# Patient Record
Sex: Female | Born: 1960 | Race: Black or African American | Hispanic: No | Marital: Single | Smoking: Never smoker
Health system: Southern US, Community
[De-identification: ages and names within clinical notes are randomized; demographics above are authoritative.]

## PROBLEM LIST (undated history)

## (undated) DIAGNOSIS — E079 Disorder of thyroid, unspecified: Secondary | ICD-10-CM

## (undated) DIAGNOSIS — H409 Unspecified glaucoma: Secondary | ICD-10-CM

## (undated) DIAGNOSIS — E279 Disorder of adrenal gland, unspecified: Secondary | ICD-10-CM

## (undated) DIAGNOSIS — K219 Gastro-esophageal reflux disease without esophagitis: Secondary | ICD-10-CM

## (undated) DIAGNOSIS — G8929 Other chronic pain: Secondary | ICD-10-CM

## (undated) DIAGNOSIS — D649 Anemia, unspecified: Secondary | ICD-10-CM

## (undated) DIAGNOSIS — M1990 Unspecified osteoarthritis, unspecified site: Secondary | ICD-10-CM

## (undated) DIAGNOSIS — E785 Hyperlipidemia, unspecified: Secondary | ICD-10-CM

## (undated) DIAGNOSIS — R519 Headache, unspecified: Secondary | ICD-10-CM

## (undated) DIAGNOSIS — I10 Essential (primary) hypertension: Secondary | ICD-10-CM

## (undated) DIAGNOSIS — R51 Headache: Secondary | ICD-10-CM

## (undated) DIAGNOSIS — R55 Syncope and collapse: Secondary | ICD-10-CM

## (undated) DIAGNOSIS — A419 Sepsis, unspecified organism: Secondary | ICD-10-CM

## (undated) DIAGNOSIS — G4730 Sleep apnea, unspecified: Secondary | ICD-10-CM

## (undated) DIAGNOSIS — M549 Dorsalgia, unspecified: Secondary | ICD-10-CM

## (undated) DIAGNOSIS — E039 Hypothyroidism, unspecified: Secondary | ICD-10-CM

## (undated) DIAGNOSIS — M5416 Radiculopathy, lumbar region: Secondary | ICD-10-CM

## (undated) DIAGNOSIS — I4891 Unspecified atrial fibrillation: Secondary | ICD-10-CM

## (undated) DIAGNOSIS — E2740 Unspecified adrenocortical insufficiency: Secondary | ICD-10-CM

## (undated) DIAGNOSIS — J383 Other diseases of vocal cords: Secondary | ICD-10-CM

## (undated) DIAGNOSIS — J449 Chronic obstructive pulmonary disease, unspecified: Secondary | ICD-10-CM

## (undated) HISTORY — PX: BACK SURGERY: SHX140

## (undated) HISTORY — PX: CHOLECYSTECTOMY, LAPAROSCOPIC: SHX56

## (undated) HISTORY — PX: OTHER SURGICAL HISTORY: SHX169

## (undated) HISTORY — PX: CHOLECYSTECTOMY: SHX55

## (undated) HISTORY — PX: ABDOMINAL HYSTERECTOMY: SHX81

## (undated) HISTORY — PX: CARDIAC CATHETERIZATION: SHX172

---

## 1999-01-22 ENCOUNTER — Ambulatory Visit (HOSPITAL_COMMUNITY): Admission: RE | Admit: 1999-01-22 | Discharge: 1999-01-23 | Payer: Self-pay | Admitting: Orthopaedic Surgery

## 1999-06-16 ENCOUNTER — Emergency Department (HOSPITAL_COMMUNITY): Admission: EM | Admit: 1999-06-16 | Discharge: 1999-06-17 | Payer: Self-pay | Admitting: Emergency Medicine

## 1999-11-09 ENCOUNTER — Emergency Department (HOSPITAL_COMMUNITY): Admission: EM | Admit: 1999-11-09 | Discharge: 1999-11-09 | Payer: Self-pay | Admitting: Internal Medicine

## 1999-11-09 ENCOUNTER — Encounter: Payer: Self-pay | Admitting: Internal Medicine

## 2000-02-08 ENCOUNTER — Emergency Department (HOSPITAL_COMMUNITY): Admission: EM | Admit: 2000-02-08 | Discharge: 2000-02-08 | Payer: Self-pay | Admitting: Emergency Medicine

## 2001-01-14 ENCOUNTER — Emergency Department (HOSPITAL_COMMUNITY): Admission: EM | Admit: 2001-01-14 | Discharge: 2001-01-14 | Payer: Self-pay | Admitting: *Deleted

## 2003-02-27 ENCOUNTER — Emergency Department (HOSPITAL_COMMUNITY): Admission: EM | Admit: 2003-02-27 | Discharge: 2003-02-27 | Payer: Self-pay | Admitting: Emergency Medicine

## 2004-01-18 ENCOUNTER — Emergency Department: Payer: Self-pay | Admitting: Emergency Medicine

## 2004-02-11 ENCOUNTER — Ambulatory Visit (HOSPITAL_COMMUNITY): Admission: RE | Admit: 2004-02-11 | Discharge: 2004-02-11 | Payer: Self-pay | Admitting: Internal Medicine

## 2004-02-25 ENCOUNTER — Encounter: Admission: RE | Admit: 2004-02-25 | Discharge: 2004-02-25 | Payer: Self-pay | Admitting: Internal Medicine

## 2004-04-11 ENCOUNTER — Emergency Department: Payer: Self-pay | Admitting: Emergency Medicine

## 2004-04-11 ENCOUNTER — Other Ambulatory Visit: Payer: Self-pay

## 2004-06-27 ENCOUNTER — Ambulatory Visit: Payer: Self-pay | Admitting: Internal Medicine

## 2004-08-14 ENCOUNTER — Encounter: Admission: RE | Admit: 2004-08-14 | Discharge: 2004-08-14 | Payer: Self-pay | Admitting: Internal Medicine

## 2004-10-07 ENCOUNTER — Other Ambulatory Visit: Payer: Self-pay

## 2004-10-07 ENCOUNTER — Inpatient Hospital Stay: Payer: Self-pay | Admitting: Internal Medicine

## 2004-12-28 ENCOUNTER — Emergency Department: Payer: Self-pay | Admitting: Emergency Medicine

## 2005-01-22 ENCOUNTER — Encounter: Admission: RE | Admit: 2005-01-22 | Discharge: 2005-01-22 | Payer: Self-pay | Admitting: Internal Medicine

## 2005-03-03 ENCOUNTER — Inpatient Hospital Stay: Payer: Self-pay | Admitting: Internal Medicine

## 2005-04-23 ENCOUNTER — Ambulatory Visit: Payer: Self-pay | Admitting: Internal Medicine

## 2005-05-13 ENCOUNTER — Ambulatory Visit: Payer: Self-pay | Admitting: Gastroenterology

## 2005-05-28 ENCOUNTER — Ambulatory Visit: Payer: Self-pay

## 2005-08-10 ENCOUNTER — Ambulatory Visit: Payer: Self-pay | Admitting: Cardiovascular Disease

## 2005-09-30 ENCOUNTER — Ambulatory Visit: Payer: Self-pay | Admitting: Internal Medicine

## 2006-01-03 ENCOUNTER — Emergency Department: Payer: Self-pay | Admitting: Emergency Medicine

## 2006-03-07 ENCOUNTER — Emergency Department: Payer: Self-pay | Admitting: Internal Medicine

## 2006-03-07 ENCOUNTER — Other Ambulatory Visit: Payer: Self-pay

## 2006-04-13 HISTORY — PX: OTHER SURGICAL HISTORY: SHX169

## 2006-11-04 ENCOUNTER — Other Ambulatory Visit: Payer: Self-pay

## 2006-11-04 ENCOUNTER — Emergency Department: Payer: Self-pay | Admitting: Unknown Physician Specialty

## 2007-01-04 ENCOUNTER — Ambulatory Visit: Payer: Self-pay

## 2007-08-16 ENCOUNTER — Ambulatory Visit: Payer: Self-pay | Admitting: Cardiovascular Disease

## 2008-01-12 ENCOUNTER — Ambulatory Visit: Payer: Self-pay | Admitting: Internal Medicine

## 2008-04-13 HISTORY — PX: COLONOSCOPY: SHX174

## 2009-07-04 ENCOUNTER — Ambulatory Visit: Payer: Self-pay | Admitting: Cardiovascular Disease

## 2011-04-14 DIAGNOSIS — E2740 Unspecified adrenocortical insufficiency: Secondary | ICD-10-CM

## 2011-04-14 HISTORY — DX: Unspecified adrenocortical insufficiency: E27.40

## 2011-05-11 ENCOUNTER — Encounter (HOSPITAL_COMMUNITY): Payer: Self-pay

## 2011-05-11 ENCOUNTER — Encounter (HOSPITAL_COMMUNITY)
Admission: RE | Admit: 2011-05-11 | Discharge: 2011-05-11 | Disposition: A | Discharge status: Home / Self Care | Payer: Self-pay | Source: Ambulatory Visit | Attending: Internal Medicine | Admitting: Internal Medicine

## 2011-05-11 DIAGNOSIS — J45909 Unspecified asthma, uncomplicated: Secondary | ICD-10-CM | POA: Insufficient documentation

## 2011-05-11 HISTORY — DX: Hyperlipidemia, unspecified: E78.5

## 2011-05-11 HISTORY — DX: Syncope and collapse: R55

## 2011-05-11 HISTORY — DX: Essential (primary) hypertension: I10

## 2011-05-11 NOTE — Progress Notes (Signed)
Orientation to Pulmonary Rehab. Demonstration and practice of PLB using pulse oximeter.  Patient able to return demonstration satisfactorily. Safety and hand hygiene in the exercise area reviewed with patient.  Patient voices understanding.

## 2011-05-19 ENCOUNTER — Encounter (HOSPITAL_COMMUNITY)
Admission: RE | Admit: 2011-05-19 | Discharge: 2011-05-19 | Disposition: A | Discharge status: Home / Self Care | Payer: Medicare Other | Source: Ambulatory Visit | Attending: Internal Medicine | Admitting: Internal Medicine

## 2011-05-19 DIAGNOSIS — J45909 Unspecified asthma, uncomplicated: Secondary | ICD-10-CM | POA: Insufficient documentation

## 2011-05-21 ENCOUNTER — Encounter (HOSPITAL_COMMUNITY)
Admission: RE | Admit: 2011-05-21 | Discharge: 2011-05-21 | Disposition: A | Discharge status: Home / Self Care | Payer: Medicare Other | Source: Ambulatory Visit | Attending: Internal Medicine | Admitting: Internal Medicine

## 2011-05-21 NOTE — Progress Notes (Signed)
Pulmonary Rehab Nutrition Screen  Deborah Murray 51 y.o. female             Ht: 62.5" Ht Readings from Last 1 Encounters:  No data found for Ht    Wt:   256.1lb (116.4 kg) Wt Readings from Last 3 Encounters:  No data found for Wt    BMI: 47.1  53.5%body fat                       Rate Your Plate Score: 51  Please answer the following questions:             YES  NO    Do you live in a nursing home?  X   Do you eat out more than 3 times per week?   X  If yes, how many times per week do you eat out? "Probably" 3-4   Do you have food allergies?   X If yes, what are you allergic to?  Have you gained more than 10 lbs without trying?              X  If yes, how much weight have you  gained? 30 lbs over  weeks/mo   Do you want to lose weight?    X  If yes, what is a goal weight or amount of weight you would like to lose? At least 55 lbs or more  Do you eat alone most of the time?  X    Do you eat less than 2 meals/day?  X If yes, how many meals do you eat? 3 or more  Do you use canned and convenience food? X    Do you use a salt shaker? X    Do you drink more than 3 alcoholic drinks/day?  X If yes, how many drinks per day?  Are you having trouble with constipation? *  X If yes, what are you doing to help relieve constipation?  Do you have financial difficulties with buying food? *  X   Do you usually need help with grocery shopping or with cooking? *  X   Do you have a poor appetite? *                                       X   Do you have trouble chewing/ swallowing? *   X   Do you take vitamin and mineral or herbal supplements? * X  If yes, what kind of supplements do you currently take? MVI    Past Medical History  Diagnosis Date  . Asthma   . Hyperlipidemia   . Hypertension   . Syncope and collapse 05/11/11  adrenal insuffiency  treated at [facility]   Labs Lipid Panel  No results found for this basename: chol, trig, hdl, cholhdl, vldl, ldlcalc   No results found for this  basename: HGBA1C    Nutrition Risk Level:  Low

## 2011-05-21 NOTE — Progress Notes (Signed)
Completed home exercise with patient. Reviewed exercise progression, routine, exercising at a comfortable pace, RPE/Dyspnea scales, how important it is to own a pulse oximeter and how to use one, weather conditions, warning signs and symptoms with exercise, and CP/NTG. We discussed when to call MD. Patient voices understanding. Patient has a goal to build endurance to run around with grand baby without becoming short of breath.   Will continue to encourage and support.

## 2011-05-26 ENCOUNTER — Encounter (HOSPITAL_COMMUNITY)
Admission: RE | Admit: 2011-05-26 | Discharge: 2011-05-26 | Disposition: A | Discharge status: Home / Self Care | Payer: Medicare Other | Source: Ambulatory Visit | Attending: Internal Medicine | Admitting: Internal Medicine

## 2011-05-28 ENCOUNTER — Encounter (HOSPITAL_COMMUNITY)
Admission: RE | Admit: 2011-05-28 | Discharge: 2011-05-28 | Disposition: A | Discharge status: Home / Self Care | Payer: Medicare Other | Source: Ambulatory Visit | Attending: Internal Medicine | Admitting: Internal Medicine

## 2011-06-02 ENCOUNTER — Encounter (HOSPITAL_COMMUNITY)
Admission: RE | Admit: 2011-06-02 | Discharge: 2011-06-02 | Disposition: A | Discharge status: Home / Self Care | Payer: Medicare Other | Source: Ambulatory Visit | Attending: Internal Medicine | Admitting: Internal Medicine

## 2011-06-02 NOTE — Progress Notes (Signed)
Deborah Murray 51 y.o. female Nutrition Note Spoke with pt.  Nutrition Plan and Nutrition Survey reviewed with pt. There are some ways the pt can improve her eating habits. Pt c/o 30 lb wt gain over the past year due to taking prednisone.  Pt states she weighed 370 lb prior to gastric bypass surgery.  Lowest wt was 220# before she started prednisone.  Lifestyle changes to promote weight loss and weight loss tips discussed.  Pt c/o not sleeping while on prednisone.  Pt states she gets 0-2 hours a night.  Per pt, MD did not want pt taking any sleeping aide due to breathing difficulty. Cathie Olden, RN made aware of pt difficulty sleeping. The role of sleep and wt maintenance discussed. Pt taking Caltrate and eating dairy products to achieve goal of 1500 mg of Calcium daily while on prednisone. Pt uses canned and convenience food and eats out 3-4 times a week. Pt add salt to food. The role of sodium in lung disease reviewed with pt. Nutrition Diagnosis   Excessive sodium intake related to over consumption of processed food as evidenced by frequent consumption of convenience food/ canned vegetables and eating out frequently.   Food-and nutrition-related knowledge deficit related to lack of exposure to information as related to diagnosis of pulmonary disease   Obesity related to excessive energy intake as evidenced by a BMI of 47.1 Nutrition Rx/Est. Daily Nutrition Needs for: ? wt loss 1450-1950 Kcal 95-115 gm protein   1500 mg or less sodium     Nutrition Intervention   Pt's individual nutrition plan and goals reviewed with pt.   Benefits of adopting healthy eating habits discussed when pt's Rate Your Plate reviewed.   Handouts for [employer], List of American Heart Association approved foods.   Will fax Dr. Cherly Hensen re: ? If pt may benefit from prescription or OTC sleep aide   Pt to attend the Nutrition and Lung Disease class   Continual client-centered nutrition education by RD, as part of  interdisciplinary care. Goal(s) 1. Pt to identify and limit food sources of sodium. 2. Identify food quantities necessary to achieve wt loss of  -2# per week to a goal wt of 105.5-113.6 kg (232-250 lb) at graduation from pulmonary rehab. 3. Describe the benefit of including fruits, vegetables, whole grains, and low-fat dairy products in a healthy meal plan. Monitor and Evaluate progress toward nutrition goal with team.

## 2011-06-04 ENCOUNTER — Encounter (HOSPITAL_COMMUNITY)
Admission: RE | Admit: 2011-06-04 | Discharge: 2011-06-04 | Disposition: A | Discharge status: Home / Self Care | Payer: Medicare Other | Source: Ambulatory Visit | Attending: Internal Medicine | Admitting: Internal Medicine

## 2011-06-09 ENCOUNTER — Encounter (HOSPITAL_COMMUNITY)
Admission: RE | Admit: 2011-06-09 | Discharge: 2011-06-09 | Disposition: A | Discharge status: Home / Self Care | Payer: Medicare Other | Source: Ambulatory Visit | Attending: Internal Medicine | Admitting: Internal Medicine

## 2011-06-11 ENCOUNTER — Encounter (HOSPITAL_COMMUNITY)
Admission: RE | Admit: 2011-06-11 | Discharge: 2011-06-11 | Disposition: A | Discharge status: Home / Self Care | Payer: Medicare Other | Source: Ambulatory Visit | Attending: Internal Medicine | Admitting: Internal Medicine

## 2011-06-11 NOTE — Progress Notes (Signed)
Deborah Murray reported to staff today during exercise at Pulmonary Rehab that she did not feel good yesterday or today. Very tired, feeling more short of breath.  Distant breath sounds bilaterally.  She is advised to call her Pulmonary MD at [facility].

## 2011-06-16 ENCOUNTER — Encounter (HOSPITAL_COMMUNITY)
Admission: RE | Admit: 2011-06-16 | Discharge: 2011-06-16 | Disposition: A | Discharge status: Home / Self Care | Payer: Medicare Other | Source: Ambulatory Visit | Attending: Internal Medicine | Admitting: Internal Medicine

## 2011-06-16 DIAGNOSIS — J45909 Unspecified asthma, uncomplicated: Secondary | ICD-10-CM | POA: Insufficient documentation

## 2011-06-18 ENCOUNTER — Encounter (HOSPITAL_COMMUNITY)
Admission: RE | Admit: 2011-06-18 | Discharge: 2011-06-18 | Disposition: A | Discharge status: Home / Self Care | Payer: Medicare Other | Source: Ambulatory Visit | Attending: Internal Medicine | Admitting: Internal Medicine

## 2011-06-23 ENCOUNTER — Encounter (HOSPITAL_COMMUNITY)
Admission: RE | Admit: 2011-06-23 | Discharge: 2011-06-23 | Disposition: A | Discharge status: Home / Self Care | Payer: Medicare Other | Source: Ambulatory Visit | Attending: Internal Medicine | Admitting: Internal Medicine

## 2011-06-25 ENCOUNTER — Encounter (HOSPITAL_COMMUNITY)
Admission: RE | Admit: 2011-06-25 | Discharge: 2011-06-25 | Disposition: A | Discharge status: Home / Self Care | Payer: Medicare Other | Source: Ambulatory Visit | Attending: Internal Medicine | Admitting: Internal Medicine

## 2011-06-30 ENCOUNTER — Encounter (HOSPITAL_COMMUNITY)
Admission: RE | Admit: 2011-06-30 | Discharge: 2011-06-30 | Disposition: A | Discharge status: Home / Self Care | Payer: Medicare Other | Source: Ambulatory Visit | Attending: Internal Medicine | Admitting: Internal Medicine

## 2011-07-02 ENCOUNTER — Encounter (HOSPITAL_COMMUNITY)
Admission: RE | Admit: 2011-07-02 | Discharge: 2011-07-02 | Disposition: A | Discharge status: Home / Self Care | Payer: Medicare Other | Source: Ambulatory Visit | Attending: Internal Medicine | Admitting: Internal Medicine

## 2011-07-07 ENCOUNTER — Encounter (HOSPITAL_COMMUNITY)
Admission: RE | Admit: 2011-07-07 | Discharge: 2011-07-07 | Disposition: A | Discharge status: Home / Self Care | Payer: Medicare Other | Source: Ambulatory Visit | Attending: Internal Medicine | Admitting: Internal Medicine

## 2011-07-09 ENCOUNTER — Encounter (HOSPITAL_COMMUNITY)
Admission: RE | Admit: 2011-07-09 | Discharge: 2011-07-09 | Disposition: A | Discharge status: Home / Self Care | Payer: Medicare Other | Source: Ambulatory Visit | Attending: Internal Medicine | Admitting: Internal Medicine

## 2011-07-14 ENCOUNTER — Encounter (HOSPITAL_COMMUNITY)
Admission: RE | Admit: 2011-07-14 | Discharge: 2011-07-14 | Disposition: A | Discharge status: Home / Self Care | Payer: Medicare Other | Source: Ambulatory Visit | Attending: Internal Medicine | Admitting: Internal Medicine

## 2011-07-14 DIAGNOSIS — J45909 Unspecified asthma, uncomplicated: Secondary | ICD-10-CM | POA: Insufficient documentation

## 2011-07-16 ENCOUNTER — Encounter (HOSPITAL_COMMUNITY)
Admission: RE | Admit: 2011-07-16 | Discharge: 2011-07-16 | Disposition: A | Discharge status: Home / Self Care | Payer: Medicare Other | Source: Ambulatory Visit | Attending: Internal Medicine | Admitting: Internal Medicine

## 2011-07-21 ENCOUNTER — Encounter (HOSPITAL_COMMUNITY)
Admission: RE | Admit: 2011-07-21 | Discharge: 2011-07-21 | Disposition: A | Discharge status: Home / Self Care | Payer: Medicare Other | Source: Ambulatory Visit | Attending: Internal Medicine | Admitting: Internal Medicine

## 2011-07-23 ENCOUNTER — Encounter (HOSPITAL_COMMUNITY)
Admission: RE | Admit: 2011-07-23 | Discharge: 2011-07-23 | Disposition: A | Discharge status: Home / Self Care | Payer: Medicare Other | Source: Ambulatory Visit | Attending: Internal Medicine | Admitting: Internal Medicine

## 2011-07-28 ENCOUNTER — Encounter (HOSPITAL_COMMUNITY)
Admission: RE | Admit: 2011-07-28 | Discharge: 2011-07-28 | Disposition: A | Discharge status: Home / Self Care | Payer: Medicare Other | Source: Ambulatory Visit | Attending: Internal Medicine | Admitting: Internal Medicine

## 2011-07-30 ENCOUNTER — Encounter (HOSPITAL_COMMUNITY): Payer: Medicare Other

## 2011-08-04 ENCOUNTER — Encounter (HOSPITAL_COMMUNITY)
Admission: RE | Admit: 2011-08-04 | Discharge: 2011-08-04 | Disposition: A | Discharge status: Home / Self Care | Payer: Medicare Other | Source: Ambulatory Visit | Attending: Internal Medicine | Admitting: Internal Medicine

## 2011-08-06 ENCOUNTER — Encounter (HOSPITAL_COMMUNITY)
Admission: RE | Admit: 2011-08-06 | Discharge: 2011-08-06 | Disposition: A | Discharge status: Home / Self Care | Payer: Medicare Other | Source: Ambulatory Visit

## 2011-08-11 ENCOUNTER — Encounter (HOSPITAL_COMMUNITY): Payer: Medicare Other

## 2011-08-13 ENCOUNTER — Encounter (HOSPITAL_COMMUNITY)
Admission: RE | Admit: 2011-08-13 | Discharge: 2011-08-13 | Disposition: A | Discharge status: Home / Self Care | Payer: Medicare Other | Source: Ambulatory Visit | Attending: Internal Medicine | Admitting: Internal Medicine

## 2011-08-13 DIAGNOSIS — J45909 Unspecified asthma, uncomplicated: Secondary | ICD-10-CM | POA: Insufficient documentation

## 2011-08-18 ENCOUNTER — Encounter (HOSPITAL_COMMUNITY)
Admission: RE | Admit: 2011-08-18 | Discharge: 2011-08-18 | Disposition: A | Discharge status: Home / Self Care | Payer: Medicare Other | Source: Ambulatory Visit | Attending: Internal Medicine | Admitting: Internal Medicine

## 2011-08-18 NOTE — Progress Notes (Signed)
Last day of exercise, goals met.  She did note improvement in her activities of daily living.  Review of medications.

## 2011-08-20 ENCOUNTER — Encounter (HOSPITAL_COMMUNITY): Payer: Medicare Other

## 2011-08-24 NOTE — Progress Notes (Addendum)
Pulmonary Rehabilitation Program Outcomes Report   Orientation:  05/11/2011 Graduate Date:  08/18/2011 Discharge Date:  08/18/2011 # of sessions completed: 24  Pulmonologist: [facility] Dr. Rudene Christians  Class Time:  10:30  A.  Exercise Program:  Tolerates exercise @ 2.76 METS for 45 minutes, Walk Test Results:  Pre: 869ft and Post: 1235ft, Improved functional capacity  45.28 %, Improved  muscular strength  2.78 %, Improved dyspnea score -61.29 %, No Change education score 0.00 %, Exercise limited by dyspnea, Discharged to home exercise program.  Anticipated compliance:  excellent and Discharged  B.  Mental Health:  Good mental attitude and Quality of Life (QOL)  improvements:  Overall  37.89 %, Health/Functioning 62.20 %, Socioeconomics 25.63 %, Psych/Spiritual 38.10 %, Family -11.76 %    C.  Education/Instruction/Skills  Uses Perceived Exertion Scale and/or Dyspnea Scale and Attended 7 education classes  Home exercise given: 05/21/2011. Patient demonstrated PLB and diaphragmatic breathing.   D.  Nutrition/Weight Control/Body Composition:  Pt making healthy food choices frequently, % body fat decreased 1.5%, % Body Fat  52.7 and Patient has lost 2.1 kg Section completed by: Mickle Plumb, MEd, RD, LDN, CDE 09/04/11 1212  E.  Blood Lipids  No results found for this basename: CHOL, HDL, LDLCALC, LDLDIRECT, TRIG, CHOLHDL   F.  Lifestyle Changes:  Making positive lifestyle changes  G.  Symptoms noted with exercise:  Shortness of breath and Fatigue  Comments:  Patient has graduated the pulmonary undergrad program. Patient tolerated well with all exercises. Increased workloads over time. Patient maintained an SPaO2 greater than 90% on RA. VSS. No patient complaints. Patient plans to join a gym for maintenance. Patient always welcome back to Pulmonary Maintenance classes.   Ruffin Frederick, MS, NASM, CES   Agree with above.  Cathie Olden  RN

## 2011-08-25 ENCOUNTER — Encounter (HOSPITAL_COMMUNITY): Payer: Medicare Other

## 2011-08-27 ENCOUNTER — Encounter (HOSPITAL_COMMUNITY): Payer: Medicare Other

## 2011-09-01 ENCOUNTER — Encounter (HOSPITAL_COMMUNITY): Payer: Medicare Other

## 2011-09-03 ENCOUNTER — Encounter (HOSPITAL_COMMUNITY): Payer: Medicare Other

## 2011-09-08 ENCOUNTER — Encounter (HOSPITAL_COMMUNITY): Payer: Medicare Other

## 2011-09-10 ENCOUNTER — Encounter (HOSPITAL_COMMUNITY): Payer: Medicare Other

## 2011-09-15 ENCOUNTER — Encounter (HOSPITAL_COMMUNITY): Payer: Medicare Other

## 2011-10-24 ENCOUNTER — Emergency Department (HOSPITAL_COMMUNITY): Payer: Medicare Other

## 2011-10-24 ENCOUNTER — Encounter (HOSPITAL_COMMUNITY): Payer: Self-pay

## 2011-10-24 ENCOUNTER — Emergency Department (HOSPITAL_COMMUNITY)
Admission: EM | Admit: 2011-10-24 | Discharge: 2011-10-24 | Disposition: A | Discharge status: Home / Self Care | Payer: Medicare Other | Attending: Emergency Medicine | Admitting: Emergency Medicine

## 2011-10-24 DIAGNOSIS — I10 Essential (primary) hypertension: Secondary | ICD-10-CM | POA: Insufficient documentation

## 2011-10-24 DIAGNOSIS — J45909 Unspecified asthma, uncomplicated: Secondary | ICD-10-CM | POA: Insufficient documentation

## 2011-10-24 DIAGNOSIS — R110 Nausea: Secondary | ICD-10-CM | POA: Insufficient documentation

## 2011-10-24 DIAGNOSIS — E785 Hyperlipidemia, unspecified: Secondary | ICD-10-CM | POA: Insufficient documentation

## 2011-10-24 DIAGNOSIS — R51 Headache: Secondary | ICD-10-CM | POA: Insufficient documentation

## 2011-10-24 DIAGNOSIS — Z7982 Long term (current) use of aspirin: Secondary | ICD-10-CM | POA: Insufficient documentation

## 2011-10-24 DIAGNOSIS — Z79899 Other long term (current) drug therapy: Secondary | ICD-10-CM | POA: Insufficient documentation

## 2011-10-24 LAB — POCT I-STAT, CHEM 8
BUN: 10 mg/dL (ref 6–23)
Calcium, Ion: 1.14 mmol/L (ref 1.12–1.23)
Chloride: 101 mEq/L (ref 96–112)
Creatinine, Ser: 1.3 mg/dL — ABNORMAL HIGH (ref 0.50–1.10)
Glucose, Bld: 84 mg/dL (ref 70–99)
HCT: 47 % — ABNORMAL HIGH (ref 36.0–46.0)
Hemoglobin: 16 g/dL — ABNORMAL HIGH (ref 12.0–15.0)
Potassium: 3.2 mEq/L — ABNORMAL LOW (ref 3.5–5.1)
Sodium: 140 mEq/L (ref 135–145)

## 2011-10-24 MED ORDER — POTASSIUM CHLORIDE CRYS ER 20 MEQ PO TBCR
20.0000 meq | EXTENDED_RELEASE_TABLET | Freq: Once | ORAL | Status: AC
  Administered 2011-10-24: 20 meq via ORAL
  Filled 2011-10-24: qty 1

## 2011-10-24 MED ORDER — METOCLOPRAMIDE HCL 5 MG/ML IJ SOLN
10.0000 mg | Freq: Once | INTRAMUSCULAR | Status: AC
  Administered 2011-10-24: 10 mg via INTRAVENOUS
  Filled 2011-10-24: qty 2

## 2011-10-24 MED ORDER — HYDROMORPHONE HCL PF 1 MG/ML IJ SOLN
1.0000 mg | Freq: Once | INTRAMUSCULAR | Status: AC
  Administered 2011-10-24: 1 mg via INTRAVENOUS
  Filled 2011-10-24: qty 1

## 2011-10-24 MED ORDER — SODIUM CHLORIDE 0.9 % IV BOLUS (SEPSIS)
500.0000 mL | Freq: Once | INTRAVENOUS | Status: AC
  Administered 2011-10-24: 500 mL via INTRAVENOUS

## 2011-10-24 MED ORDER — DIPHENHYDRAMINE HCL 50 MG/ML IJ SOLN
25.0000 mg | Freq: Once | INTRAMUSCULAR | Status: AC
  Administered 2011-10-24: 25 mg via INTRAVENOUS
  Filled 2011-10-24: qty 1

## 2011-10-24 NOTE — ED Provider Notes (Signed)
History     CSN: [id number]  Arrival date & time 10/24/11  1718   First MD Initiated Contact with Patient 10/24/11 1724      Chief Complaint  Patient presents with  . Headache    (Consider location/radiation/quality/duration/timing/severity/associated sxs/prior treatment) Patient is a 51 y.o. female presenting with headaches. The history is provided by the patient.  Headache  This is a new problem. The current episode started yesterday. The problem occurs constantly. The problem has not changed since onset.The headache is associated with nothing (Pt was not doing anything in particular when it started.). The quality of the pain is described as dull. The pain is severe. The pain does not radiate. Associated symptoms include nausea. Pertinent negatives include no anorexia, no fever, no palpitations, no syncope, no shortness of breath and no vomiting. She has tried nothing for the symptoms.  Pt does not have history of prior headaches.  She does have adrenal insufficiency but has not had trouble with her medications or vomiting recently.  Past Medical History  Diagnosis Date  . Asthma   . Hyperlipidemia   . Hypertension   . Syncope and collapse 05/11/11  adrenal insuffiency  treated at [facility]    Past Surgical History  Procedure Date  . Cardiac catheterization 2012 at [facility]  . Gastic by-pass 05-11-11    Gastric by-pass in 2008  . Abdominal hysterectomy   . Cholecystectomy, laparoscopic   . Eye surg for glaucoma   . Right knee surg     Family History  Problem Relation Age of Onset  . Heart attack Mother   . Hypertension Mother   . Heart attack Father   . Asthma Father   . Hyperlipidemia Father   . Hypertension Father   . Heart attack Sister   . Arrhythmia Sister   . Asthma Sister   . Hyperlipidemia Sister   . Hypertension Sister   . Asthma Brother   . Hypertension Brother     History  Substance Use Topics  . Smoking status: Never Smoker   . Smokeless tobacco: Not  on file  . Alcohol Use: No    OB History    Grav Para Term Preterm Abortions TAB SAB Ect Mult Living                  Review of Systems  Constitutional: Negative for fever.  Respiratory: Negative for shortness of breath.   Cardiovascular: Negative for palpitations and syncope.  Gastrointestinal: Positive for nausea. Negative for vomiting and anorexia.  Neurological: Positive for headaches.  All other systems reviewed and are negative.    Allergies  Aspirin; Codeine sulfate; Darvocet; and Penicillins  Home Medications   Current Outpatient Rx  Name Route Sig Dispense Refill  . ALBUTEROL SULFATE HFA 108 (90 BASE) MCG/ACT IN AERS Inhalation Inhale 2 puffs into the lungs every 6 (six) hours as needed.    . ALBUTEROL SULFATE (5 MG/ML) 0.5% IN NEBU Nebulization Take 2.5 mg by nebulization every 6 (six) hours as needed.    Marland Kitchen AMLODIPINE BESYLATE 5 MG PO TABS Oral Take 5 mg by mouth daily.    . ASPIRIN 81 MG PO TABS Oral Take 160 mg by mouth daily.    . ATORVASTATIN CALCIUM 20 MG PO TABS Oral Take 20 mg by mouth daily.    Marland Kitchen BRIMONIDINE TARTRATE 0.15 % OP SOLN  1 drop at bedtime.    Marland Kitchen BRIMONIDINE TARTRATE 0.2 % OP SOLN Both Eyes Place 1 drop into both  eyes 2 (two) times daily.    Marland Kitchen CALCIUM CITRATE 950 MG PO TABS Oral Take 1 tablet by mouth daily.    Marland Kitchen DIFLUNISAL 500 MG PO TABS Oral Take 500 mg by mouth 1 day or 1 dose.    Marland Kitchen ESOMEPRAZOLE MAGNESIUM 40 MG PO CPDR Oral Take 40 mg by mouth daily before breakfast.    . FEXOFENADINE HCL 180 MG PO TABS Oral Take 180 mg by mouth daily.    Marland Kitchen FLUTICASONE PROPIONATE 50 MCG/ACT NA SUSP Nasal Place 2 sprays into the nose daily.    Marland Kitchen FLUTICASONE PROPIONATE  HFA 220 MCG/ACT IN AERO Inhalation Inhale 2 puffs into the lungs 1 day or 1 dose.    Marland Kitchen FLUTICASONE-SALMETEROL 500-50 MCG/DOSE IN AEPB Inhalation Inhale 1 puff into the lungs every 12 (twelve) hours.    . IBANDRONATE SODIUM 150 MG PO TABS Oral Take 150 mg by mouth every 30 (thirty) days. Take in the  morning with a full glass of water, on an empty stomach, and do not take anything else by mouth or lie down for the next 30 min.    Marland Kitchen LISINOPRIL 20 MG PO TABS Oral Take 20 mg by mouth daily.    Marland Kitchen MONTELUKAST SODIUM 10 MG PO TABS Oral Take 10 mg by mouth at bedtime.    . MULTIVITAMINS PO CAPS Oral Take 1 capsule by mouth daily.    Marland Kitchen POTASSIUM CHLORIDE CRYS ER 20 MEQ PO TBCR Oral Take 20 mEq by mouth 2 (two) times daily.    Marland Kitchen PREDNISONE 20 MG PO TABS Oral Take 20 mg by mouth daily.    . THEOPHYLLINE ER 200 MG PO TB12 Oral Take 200 mg by mouth 2 (two) times daily.    . THEOPHYLLINE 300 MG PO TB12 Oral Take 300 mg by mouth 1 day or 1 dose.    Marland Kitchen TIOTROPIUM BROMIDE MONOHYDRATE 18 MCG IN CAPS Inhalation Place 18 mcg into inhaler and inhale daily.    . TORSEMIDE 20 MG PO TABS Oral Take 40 mg by mouth daily.      BP 109/66  Pulse 66  Temp 98.7 F (37.1 C) (Oral)  Resp 20  Ht 5\' 2"  (1.575 m)  Wt 250 lb (113.399 kg)  BMI 45.73 kg/m2  SpO2 99%  Physical Exam  Nursing note and vitals reviewed. Constitutional: She is oriented to person, place, and time. She appears well-developed and well-nourished. No distress.  HENT:  Head: Normocephalic and atraumatic.  Right Ear: External ear normal.  Left Ear: External ear normal.  Eyes: Conjunctivae are normal. Right eye exhibits no discharge. Left eye exhibits no discharge. No scleral icterus.  Neck: Neck supple. No tracheal deviation present.  Cardiovascular: Normal rate, regular rhythm and intact distal pulses.   Pulmonary/Chest: Effort normal and breath sounds normal. No stridor. No respiratory distress. She has no wheezes. She has no rales.  Abdominal: Soft. Bowel sounds are normal. She exhibits no distension. There is no tenderness. There is no rebound and no guarding.  Musculoskeletal: She exhibits no edema and no tenderness.  Neurological: She is alert and oriented to person, place, and time. She has normal strength. She is not disoriented. She  displays no atrophy and no tremor. No cranial nerve deficit or sensory deficit. She exhibits normal muscle tone. She displays no seizure activity. Coordination and gait normal.  Skin: Skin is warm and dry. No rash noted.  Psychiatric: She has a normal mood and affect.    ED Course  Procedures (  including critical care time)  Labs Reviewed  POCT I-STAT, CHEM 8 - Abnormal; Notable for the following:    Potassium 3.2 (*)     Creatinine, Ser 1.30 (*)     Hemoglobin 16.0 (*)     HCT 47.0 (*)     All other components within normal limits   Ct Head Wo Contrast  10/24/2011  *RADIOLOGY REPORT*  Clinical Data: Severe headache for 2 weeks without injury.  CT HEAD WITHOUT CONTRAST  Technique:  Contiguous axial images were obtained from the base of the skull through the vertex without contrast.  Comparison: None.  Findings: Bone windows demonstrate clear paranasal sinuses and mastoid air cells.  Soft tissue windows demonstrate no  mass lesion, hemorrhage, hydrocephalus, acute infarct, intra-axial, or extra-axial fluid collection.  IMPRESSION: Normal head CT.  Original Report Authenticated By: Consuello Bossier, M.D.     1. Headache       MDM  The patient had not mentioned to me any neurologic symptoms however she mentioned to the nurse having some weakness on the right side of her body last night.  No numbness or weakness in the ED.  Nl strength and sensation.  Possible migraine headache.  Doubt meningitis, tia, stroke.  Symptoms better after treatment.  At this time there does not appear to be any evidence of an acute emergency medical condition and the patient appears stable for discharge with appropriate outpatient follow up.         Celene Kras, MD 10/24/11 [phone]

## 2011-10-24 NOTE — ED Notes (Signed)
Severe headache since yesterday, +nausea, no vomiting.

## 2011-10-24 NOTE — ED Notes (Addendum)
Pt reports headache since approx 3pm yesterday.  Denies history of headaches.  Pt says around 9pm last night had weakness on right side of body and difficulty swallowing.  Pt denies symptoms now but says has excruciating headache.  EDP aware and CT scan ordered.

## 2012-07-07 ENCOUNTER — Encounter (HOSPITAL_COMMUNITY): Payer: Self-pay

## 2012-07-07 ENCOUNTER — Emergency Department (HOSPITAL_COMMUNITY)
Admission: EM | Admit: 2012-07-07 | Discharge: 2012-07-07 | Disposition: A | Discharge status: Home / Self Care | Payer: Medicare Other | Attending: Emergency Medicine | Admitting: Emergency Medicine

## 2012-07-07 DIAGNOSIS — Z7982 Long term (current) use of aspirin: Secondary | ICD-10-CM | POA: Insufficient documentation

## 2012-07-07 DIAGNOSIS — IMO0002 Reserved for concepts with insufficient information to code with codable children: Secondary | ICD-10-CM | POA: Insufficient documentation

## 2012-07-07 DIAGNOSIS — K529 Noninfective gastroenteritis and colitis, unspecified: Secondary | ICD-10-CM

## 2012-07-07 DIAGNOSIS — I10 Essential (primary) hypertension: Secondary | ICD-10-CM | POA: Insufficient documentation

## 2012-07-07 DIAGNOSIS — Z90710 Acquired absence of both cervix and uterus: Secondary | ICD-10-CM | POA: Insufficient documentation

## 2012-07-07 DIAGNOSIS — Z79899 Other long term (current) drug therapy: Secondary | ICD-10-CM | POA: Insufficient documentation

## 2012-07-07 DIAGNOSIS — J45909 Unspecified asthma, uncomplicated: Secondary | ICD-10-CM | POA: Insufficient documentation

## 2012-07-07 DIAGNOSIS — Z9089 Acquired absence of other organs: Secondary | ICD-10-CM | POA: Insufficient documentation

## 2012-07-07 DIAGNOSIS — E785 Hyperlipidemia, unspecified: Secondary | ICD-10-CM | POA: Insufficient documentation

## 2012-07-07 DIAGNOSIS — Z862 Personal history of diseases of the blood and blood-forming organs and certain disorders involving the immune mechanism: Secondary | ICD-10-CM | POA: Insufficient documentation

## 2012-07-07 DIAGNOSIS — Z9861 Coronary angioplasty status: Secondary | ICD-10-CM | POA: Insufficient documentation

## 2012-07-07 DIAGNOSIS — Z9884 Bariatric surgery status: Secondary | ICD-10-CM | POA: Insufficient documentation

## 2012-07-07 DIAGNOSIS — R197 Diarrhea, unspecified: Secondary | ICD-10-CM | POA: Insufficient documentation

## 2012-07-07 DIAGNOSIS — K5289 Other specified noninfective gastroenteritis and colitis: Secondary | ICD-10-CM | POA: Insufficient documentation

## 2012-07-07 DIAGNOSIS — Z8639 Personal history of other endocrine, nutritional and metabolic disease: Secondary | ICD-10-CM | POA: Insufficient documentation

## 2012-07-07 HISTORY — DX: Disorder of thyroid, unspecified: E07.9

## 2012-07-07 LAB — LACTIC ACID, PLASMA: Lactic Acid, Venous: 0.7 mmol/L (ref 0.5–2.2)

## 2012-07-07 LAB — COMPREHENSIVE METABOLIC PANEL
ALT: 32 U/L (ref 0–35)
AST: 41 U/L — ABNORMAL HIGH (ref 0–37)
Albumin: 3.1 g/dL — ABNORMAL LOW (ref 3.5–5.2)
Alkaline Phosphatase: 130 U/L — ABNORMAL HIGH (ref 39–117)
BUN: 26 mg/dL — ABNORMAL HIGH (ref 6–23)
CO2: 12 mEq/L — ABNORMAL LOW (ref 19–32)
Calcium: 8.2 mg/dL — ABNORMAL LOW (ref 8.4–10.5)
Chloride: 115 mEq/L — ABNORMAL HIGH (ref 96–112)
Creatinine, Ser: 1.43 mg/dL — ABNORMAL HIGH (ref 0.50–1.10)
GFR calc Af Amer: 47 mL/min — ABNORMAL LOW (ref >90)
GFR calc non Af Amer: 41 mL/min — ABNORMAL LOW (ref >90)
Glucose, Bld: 97 mg/dL (ref 70–99)
Potassium: 6.2 mEq/L — ABNORMAL HIGH (ref 3.5–5.1)
Sodium: 134 mEq/L — ABNORMAL LOW (ref 135–145)
Total Bilirubin: 0.3 mg/dL (ref 0.3–1.2)
Total Protein: 6.6 g/dL (ref 6.0–8.3)

## 2012-07-07 LAB — CBC WITH DIFFERENTIAL/PLATELET
Basophils Absolute: 0 10*3/uL (ref 0.0–0.1)
Basophils Relative: 0 % (ref 0–1)
Eosinophils Absolute: 0 10*3/uL (ref 0.0–0.7)
HCT: 49 % — ABNORMAL HIGH (ref 36.0–46.0)
Hemoglobin: 15.6 g/dL — ABNORMAL HIGH (ref 12.0–15.0)
Lymphs Abs: 0.9 10*3/uL (ref 0.7–4.0)
MCH: 29.5 pg (ref 26.0–34.0)
MCHC: 31.8 g/dL (ref 30.0–36.0)
Monocytes Relative: 7 % (ref 3–12)
Neutro Abs: 8.5 10*3/uL — ABNORMAL HIGH (ref 1.7–7.7)
Neutrophils Relative %: 85 % — ABNORMAL HIGH (ref 43–77)
Platelets: 260 10*3/uL (ref 150–400)
RBC: 5.28 MIL/uL — ABNORMAL HIGH (ref 3.87–5.11)
RDW: 15 % (ref 11.5–15.5)

## 2012-07-07 MED ORDER — ONDANSETRON HCL 4 MG/2ML IJ SOLN
4.0000 mg | Freq: Once | INTRAMUSCULAR | Status: AC
  Administered 2012-07-07: 4 mg via INTRAVENOUS
  Filled 2012-07-07: qty 2

## 2012-07-07 MED ORDER — METHYLPREDNISOLONE SODIUM SUCC 125 MG IJ SOLR
125.0000 mg | Freq: Once | INTRAMUSCULAR | Status: AC
  Administered 2012-07-07: 125 mg via INTRAVENOUS
  Filled 2012-07-07: qty 2

## 2012-07-07 MED ORDER — ONDANSETRON HCL 8 MG PO TABS: 8.0000 mg | ORAL_TABLET | ORAL | Status: DC | PRN

## 2012-07-07 MED ORDER — PANTOPRAZOLE SODIUM 40 MG IV SOLR
40.0000 mg | Freq: Once | INTRAVENOUS | Status: AC
  Administered 2012-07-07: 40 mg via INTRAVENOUS
  Filled 2012-07-07: qty 40

## 2012-07-07 MED ORDER — SODIUM CHLORIDE 0.9 % IV BOLUS (SEPSIS)
1000.0000 mL | Freq: Once | INTRAVENOUS | Status: AC
  Administered 2012-07-07: 1000 mL via INTRAVENOUS

## 2012-07-07 MED ORDER — SODIUM CHLORIDE 0.9 % IV BOLUS (SEPSIS): 1000.0000 mL | Freq: Once | INTRAVENOUS | Status: DC

## 2012-07-07 NOTE — ED Notes (Signed)
Pt reports nausea, vomitign and diarrhea since Tuesday. No fever.

## 2012-07-07 NOTE — ED Notes (Signed)
Pt c/o abdominal cramping and NVD since Tuesday night. Pt denies blood in vomit or stool. Pt is able to tolerate water and juice but any other food or drink "comes back up".

## 2012-07-07 NOTE — ED Provider Notes (Signed)
History  This chart was scribed for Donnetta Hutching, MD by Bennett Scrape, ED Scribe. This patient was seen in room APA19/APA19 and the patient's care was started at 5:22 PM.  CSN: [id number]  Arrival date & time 07/07/12  1704   First MD Initiated Contact with Patient 07/07/12 1722      Chief Complaint  Patient presents with  . Nausea  . Emesis  . Diarrhea    The history is provided by the patient. No language interpreter was used.    Deborah Murray is a 52 y.o. female who presents to the Emergency Department complaining of 2 days of gradual onset, gradually worsening, constant nausea with several associated episodes of emesis, diarrhea and abdominal cramping. Last episode of emesis and diarrhea occurred this morning. She denies having prior episodes of similar symptoms. She states that she has been drinking fluids since the onset. She denies any other symptoms including urinary symptoms, hematemesis, hematochezia and fever. She has a h/o asthma, adrenal insuffiencey and thyroid disease (taking pills currently). She takes prednisone 20 mg daily. She denies smoking and alcohol use.   PCP is with [facility]  Past Medical History  Diagnosis Date  . Asthma   . Hyperlipidemia   . Hypertension   . Syncope and collapse 05/11/11  adrenal insuffiency  treated at [facility]  . Thyroid disease     Past Surgical History  Procedure Laterality Date  . Cardiac catheterization  2012 at [facility]  . 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 Abdominal hysterectomy    . Cholecystectomy, laparoscopic    . Eye surg for glaucoma    . Right knee surg      Family History  Problem Relation Age of Onset  . Heart attack Mother   . Hypertension Mother   . Heart attack Father   . Asthma Father   . Hyperlipidemia Father   . Hypertension Father   . Heart attack Sister   . Arrhythmia Sister   . Asthma Sister   . Hyperlipidemia Sister   . Hypertension Sister   . Asthma Brother   .  Hypertension Brother     History  Substance Use Topics  . Smoking status: Never Smoker   . Smokeless tobacco: Not on file  . Alcohol Use: No    No OB history provided.  Review of Systems  A complete 10 system review of systems was obtained and all systems are negative except as noted in the HPI and PMH.   Allergies  Aspirin; Codeine sulfate; Darvocet; and Penicillins  Home Medications   Current Outpatient Rx  Name  Route  Sig  Dispense  Refill  . albuterol (PROVENTIL HFA;VENTOLIN HFA) 108 (90 BASE) MCG/ACT inhaler   Inhalation   Inhale 2 puffs into the lungs every 6 (six) hours as needed.         Marland Kitchen albuterol (PROVENTIL) (5 MG/ML) 0.5% nebulizer solution   Nebulization   Take 2.5 mg by nebulization every 6 (six) hours as needed.         Marland Kitchen amLODipine (NORVASC) 5 MG tablet   Oral   Take 5 mg by mouth daily.         Marland Kitchen aspirin 81 MG tablet   Oral   Take 160 mg by mouth daily.         Marland Kitchen atorvastatin (LIPITOR) 20 MG tablet   Oral   Take 20 mg by mouth daily.         Marland Kitchen  brimonidine (ALPHAGAN) 0.15 % ophthalmic solution      1 drop at bedtime.         . brimonidine (ALPHAGAN) 0.2 % ophthalmic solution   Both Eyes   Place 1 drop into both eyes 2 (two) times daily.         . calcium citrate (CALCITRATE - DOSED IN MG ELEMENTAL CALCIUM) 950 MG tablet   Oral   Take 1 tablet by mouth daily.         . diflunisal (DOLOBID) 500 MG TABS   Oral   Take 500 mg by mouth 1 day or 1 dose.         . esomeprazole (NEXIUM) 40 MG capsule   Oral   Take 40 mg by mouth daily before breakfast.         . fexofenadine (ALLEGRA) 180 MG tablet   Oral   Take 180 mg by mouth daily.         . fluticasone (FLONASE) 50 MCG/ACT nasal spray   Nasal   Place 2 sprays into the nose daily.         . fluticasone (FLOVENT HFA) 220 MCG/ACT inhaler   Inhalation   Inhale 2 puffs into the lungs 1 day or 1 dose.         Marland Kitchen Fluticasone-Salmeterol (ADVAIR) 500-50 MCG/DOSE  AEPB   Inhalation   Inhale 1 puff into the lungs every 12 (twelve) hours.         . ibandronate (BONIVA) 150 MG tablet   Oral   Take 150 mg by mouth every 30 (thirty) days. Take in the morning with a full glass of water, on an empty stomach, and do not take anything else by mouth or lie down for the next 30 min.         Marland Kitchen lisinopril (PRINIVIL,ZESTRIL) 20 MG tablet   Oral   Take 20 mg by mouth daily.         . montelukast (SINGULAIR) 10 MG tablet   Oral   Take 10 mg by mouth at bedtime.         . Multiple Vitamin (MULTIVITAMIN) capsule   Oral   Take 1 capsule by mouth daily.         . potassium chloride SA (K-DUR,KLOR-CON) 20 MEQ tablet   Oral   Take 20 mEq by mouth 2 (two) times daily.         . predniSONE (DELTASONE) 20 MG tablet   Oral   Take 20 mg by mouth daily.         . theophylline (THEODUR) 200 MG 12 hr tablet   Oral   Take 200 mg by mouth 2 (two) times daily.         . theophylline (THEODUR) 300 MG 12 hr tablet   Oral   Take 300 mg by mouth 1 day or 1 dose.         . tiotropium (SPIRIVA) 18 MCG inhalation capsule   Inhalation   Place 18 mcg into inhaler and inhale daily.         Marland Kitchen torsemide (DEMADEX) 20 MG tablet   Oral   Take 40 mg by mouth daily.           Triage Vitals: BP 128/83  Pulse 88  Temp(Src) 98.1 F (36.7 C) (Oral)  Resp 20  Ht 5\' 2"  (1.575 m)  Wt 255 lb (115.667 kg)  BMI 46.63 kg/m2  SpO2 100%  Physical Exam  Nursing  note and vitals reviewed. Constitutional: She is oriented to person, place, and time. She appears well-developed and well-nourished.  HENT:  Head: Normocephalic and atraumatic.  Eyes: Conjunctivae and EOM are normal. Pupils are equal, round, and reactive to light.  Neck: Normal range of motion. Neck supple.  Cardiovascular: Normal rate, regular rhythm and normal heart sounds.   Pulmonary/Chest: Effort normal and breath sounds normal.  Abdominal: Soft. Bowel sounds are normal.  Musculoskeletal:  Normal range of motion.  Neurological: She is alert and oriented to person, place, and time.  Skin: Skin is warm and dry.  Psychiatric: She has a normal mood and affect.    ED Course  Procedures (including critical care time)  DIAGNOSTIC STUDIES: Oxygen Saturation is 100% on room air, normal by my interpretation.    COORDINATION OF CARE: 5:25 PM-Discussed treatment plan which includes medications, CBC panel, and CMP with pt at bedside and pt agreed to plan.   5:45 PM- Ordered 1,000 mL of bolus, 4 mg Zofran injection, 40 mg Protonix injection and 125 mg Solumedrol injection  Labs Reviewed  COMPREHENSIVE METABOLIC PANEL - Abnormal; Notable for the following:    Sodium 134 (*)    Potassium 6.2 (*)    Chloride 115 (*)    CO2 12 (*)    BUN 26 (*)    Creatinine, Ser 1.43 (*)    Calcium 8.2 (*)    Albumin 3.1 (*)    AST 41 (*)    Alkaline Phosphatase 130 (*)    GFR calc non Af Amer 41 (*)    GFR calc Af Amer 47 (*)    All other components within normal limits  CBC WITH DIFFERENTIAL - Abnormal; Notable for the following:    RBC 5.28 (*)    Hemoglobin 15.6 (*)    HCT 49.0 (*)    Neutrophils Relative 85 (*)    Neutro Abs 8.5 (*)    Lymphocytes Relative 9 (*)    All other components within normal limits  LACTIC ACID, PLASMA   No results found.   No diagnosis found.   Date: 07/07/2012  Rate: 79  Rhythm: normal sinus rhythm  QRS Axis: normal  Intervals: normal  ST/T Wave abnormalities: normal  Conduction Disutrbances: none  Narrative Interpretation: unremarkable     MDM  Patient feeling much better after IV hydration. IV steroids given secondary to adrenal insufficiency. Mildly elevated potassium noted. EKG shows no elevated T waves.  Recommend patient increase prednisone until she's feeling better.  Discharge meds Zofran 8 mg #15.  She is alert, ambulatory, passing urine at discharge.      I personally performed the services described in this documentation, which  was scribed in my presence. The recorded information has been reviewed and is accurate.    Donnetta Hutching, MD 07/07/12 2118

## 2012-08-07 ENCOUNTER — Emergency Department: Payer: Self-pay | Admitting: Internal Medicine

## 2012-11-26 ENCOUNTER — Emergency Department (HOSPITAL_COMMUNITY)
Admission: EM | Admit: 2012-11-26 | Discharge: 2012-11-26 | Disposition: A | Discharge status: Home / Self Care | Payer: Medicare Other | Attending: Emergency Medicine | Admitting: Emergency Medicine

## 2012-11-26 ENCOUNTER — Emergency Department (HOSPITAL_COMMUNITY): Payer: Medicare Other

## 2012-11-26 ENCOUNTER — Encounter (HOSPITAL_COMMUNITY): Payer: Self-pay | Admitting: *Deleted

## 2012-11-26 DIAGNOSIS — J45909 Unspecified asthma, uncomplicated: Secondary | ICD-10-CM | POA: Insufficient documentation

## 2012-11-26 DIAGNOSIS — Z880 Allergy status to penicillin: Secondary | ICD-10-CM | POA: Insufficient documentation

## 2012-11-26 DIAGNOSIS — Z9884 Bariatric surgery status: Secondary | ICD-10-CM | POA: Insufficient documentation

## 2012-11-26 DIAGNOSIS — Z862 Personal history of diseases of the blood and blood-forming organs and certain disorders involving the immune mechanism: Secondary | ICD-10-CM | POA: Insufficient documentation

## 2012-11-26 DIAGNOSIS — S20211A Contusion of right front wall of thorax, initial encounter: Secondary | ICD-10-CM

## 2012-11-26 DIAGNOSIS — S20219A Contusion of unspecified front wall of thorax, initial encounter: Secondary | ICD-10-CM | POA: Insufficient documentation

## 2012-11-26 DIAGNOSIS — W010XXA Fall on same level from slipping, tripping and stumbling without subsequent striking against object, initial encounter: Secondary | ICD-10-CM | POA: Insufficient documentation

## 2012-11-26 DIAGNOSIS — Y9389 Activity, other specified: Secondary | ICD-10-CM | POA: Insufficient documentation

## 2012-11-26 DIAGNOSIS — IMO0002 Reserved for concepts with insufficient information to code with codable children: Secondary | ICD-10-CM | POA: Insufficient documentation

## 2012-11-26 DIAGNOSIS — Z9861 Coronary angioplasty status: Secondary | ICD-10-CM | POA: Insufficient documentation

## 2012-11-26 DIAGNOSIS — Z9889 Other specified postprocedural states: Secondary | ICD-10-CM | POA: Insufficient documentation

## 2012-11-26 DIAGNOSIS — E785 Hyperlipidemia, unspecified: Secondary | ICD-10-CM | POA: Insufficient documentation

## 2012-11-26 DIAGNOSIS — S6990XA Unspecified injury of unspecified wrist, hand and finger(s), initial encounter: Secondary | ICD-10-CM | POA: Insufficient documentation

## 2012-11-26 DIAGNOSIS — Z8669 Personal history of other diseases of the nervous system and sense organs: Secondary | ICD-10-CM | POA: Insufficient documentation

## 2012-11-26 DIAGNOSIS — S59909A Unspecified injury of unspecified elbow, initial encounter: Secondary | ICD-10-CM | POA: Insufficient documentation

## 2012-11-26 DIAGNOSIS — Z8639 Personal history of other endocrine, nutritional and metabolic disease: Secondary | ICD-10-CM | POA: Insufficient documentation

## 2012-11-26 DIAGNOSIS — I10 Essential (primary) hypertension: Secondary | ICD-10-CM | POA: Insufficient documentation

## 2012-11-26 DIAGNOSIS — Y9289 Other specified places as the place of occurrence of the external cause: Secondary | ICD-10-CM | POA: Insufficient documentation

## 2012-11-26 MED ORDER — HYDROCODONE-ACETAMINOPHEN 5-325 MG PO TABS
1.0000 | ORAL_TABLET | ORAL | Status: DC | PRN
Start: 2012-11-26 — End: 2014-05-13

## 2012-11-26 MED ORDER — HYDROCODONE-ACETAMINOPHEN 5-325 MG PO TABS
1.0000 | ORAL_TABLET | Freq: Once | ORAL | Status: AC
  Administered 2012-11-26: 1 via ORAL
  Filled 2012-11-26: qty 1

## 2012-11-26 NOTE — ED Notes (Addendum)
Pt fell today and landed on right side . Pt c/o pain to right rib cage. Pt has brace on right side froma torn rotator cuff surgery. Pt states she does not believe she hurt that arm.

## 2012-12-01 NOTE — ED Provider Notes (Signed)
CSN: [id number]     Arrival date & time 11/26/12  2014 History     First MD Initiated Contact with Patient 11/26/12 2102     Chief Complaint  Patient presents with  . Fall   (Consider location/radiation/quality/duration/timing/severity/associated sxs/prior Treatment) HPI Comments: Deborah Murray is a 52 y.o. Female presenting with pain along her right rib cage after tripping earlier today, landing on her side on pavement with her right arm tucked under her.  She has her right arm in a sling which she had from an old rotator cuff repair surgery.  She denies any new pain in her right shoulder and also denies other pain or injury including no head injury or neck pain.  Her rib pain is constant but worse with deep inspiration, cough, palpation and movement.  She denies shortness of breath, nausea or vomiting and has no numbness or new weakness in upper extremities. She has taken no medicines for her pain prior to arrival.     The history is provided by the patient.    Past Medical History  Diagnosis Date  . Asthma   . Hyperlipidemia   . Hypertension   . Syncope and collapse 05/11/11  adrenal insuffiency  treated at [facility]  . Thyroid disease    Past Surgical History  Procedure Laterality Date  . Cardiac catheterization  2012 at [facility]  . 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 Abdominal hysterectomy    . Cholecystectomy, laparoscopic    . Eye surg for glaucoma    . Right knee surg    . Cholecystectomy     Family History  Problem Relation Age of Onset  . Heart attack Mother   . Hypertension Mother   . Heart attack Father   . Asthma Father   . Hyperlipidemia Father   . Hypertension Father   . Heart attack Sister   . Arrhythmia Sister   . Asthma Sister   . Hyperlipidemia Sister   . Hypertension Sister   . Asthma Brother   . Hypertension Brother    History  Substance Use Topics  . Smoking status: Never Smoker   . Smokeless tobacco: Not on file  .  Alcohol Use: No   OB History   Grav Para Term Preterm Abortions TAB SAB Ect Mult Living                 Review of Systems  Constitutional: Negative for fever.  HENT: Negative for neck pain.   Respiratory: Negative for cough, shortness of breath, wheezing and stridor.   Cardiovascular: Positive for chest pain. Negative for palpitations.  Musculoskeletal: Negative for myalgias, joint swelling and arthralgias.  Skin: Negative for color change and wound.  Neurological: Negative for weakness and numbness.    Allergies  Aspirin; Codeine sulfate; Darvocet; and Penicillins  Home Medications   Current Outpatient Rx  Name  Route  Sig  Dispense  Refill  . albuterol (PROVENTIL HFA;VENTOLIN HFA) 108 (90 BASE) MCG/ACT inhaler   Inhalation   Inhale 2 puffs into the lungs every 6 (six) hours as needed.         Marland Kitchen albuterol (PROVENTIL) (5 MG/ML) 0.5% nebulizer solution   Nebulization   Take 2.5 mg by nebulization every 6 (six) hours as needed for wheezing or shortness of breath.          Marland Kitchen amLODipine (NORVASC) 5 MG tablet   Oral   Take 5 mg by mouth daily.         Marland Kitchen  atorvastatin (LIPITOR) 20 MG tablet   Oral   Take 20 mg by mouth daily.         . brimonidine (ALPHAGAN) 0.15 % ophthalmic solution   Both Eyes   Place 1 drop into both eyes at bedtime.          . calcium citrate (CALCITRATE - DOSED IN MG ELEMENTAL CALCIUM) 950 MG tablet   Oral   Take 1 tablet by mouth daily.         . diflunisal (DOLOBID) 500 MG TABS   Oral   Take 500 mg by mouth daily.          Marland Kitchen esomeprazole (NEXIUM) 40 MG capsule   Oral   Take 40 mg by mouth daily before breakfast.         . fexofenadine (ALLEGRA) 180 MG tablet   Oral   Take 180 mg by mouth daily.         . fluticasone (FLONASE) 50 MCG/ACT nasal spray   Nasal   Place 2 sprays into the nose daily.         . fluticasone (FLOVENT HFA) 220 MCG/ACT inhaler   Inhalation   Inhale 2 puffs into the lungs daily.          .  Fluticasone-Salmeterol (ADVAIR) 500-50 MCG/DOSE AEPB   Inhalation   Inhale 1 puff into the lungs every 12 (twelve) hours.         Marland Kitchen lisinopril (PRINIVIL,ZESTRIL) 20 MG tablet   Oral   Take 20 mg by mouth daily.         . montelukast (SINGULAIR) 10 MG tablet   Oral   Take 10 mg by mouth at bedtime.         . Multiple Vitamin (MULTIVITAMIN) capsule   Oral   Take 1 capsule by mouth daily.         . potassium chloride SA (K-DUR,KLOR-CON) 20 MEQ tablet   Oral   Take 20 mEq by mouth 2 (two) times daily.         . predniSONE (DELTASONE) 20 MG tablet   Oral   Take 20 mg by mouth daily.         . theophylline (THEODUR) 200 MG 12 hr tablet   Oral   Take 200 mg by mouth every morning.          . theophylline (THEODUR) 300 MG 12 hr tablet   Oral   Take 300 mg by mouth at bedtime.          Marland Kitchen tiotropium (SPIRIVA) 18 MCG inhalation capsule   Inhalation   Place 18 mcg into inhaler and inhale daily.         Marland Kitchen torsemide (DEMADEX) 20 MG tablet   Oral   Take 20 mg by mouth daily.          Marland Kitchen HYDROcodone-acetaminophen (NORCO/VICODIN) 5-325 MG per tablet   Oral   Take 1 tablet by mouth every 4 (four) hours as needed for pain.   20 tablet   0    BP 139/91  Pulse 64  Temp(Src) 98.9 F (37.2 C)  Resp 20  Ht 5\' 2"  (1.575 m)  Wt 235 lb (106.595 kg)  BMI 42.97 kg/m2  SpO2 100% Physical Exam  Nursing note and vitals reviewed. Constitutional: She appears well-developed and well-nourished.  HENT:  Head: Normocephalic and atraumatic.  Eyes: Conjunctivae are normal.  Neck: Normal range of motion. Neck supple. No spinous process tenderness and no muscular  tenderness present.  Cardiovascular: Normal rate, regular rhythm, normal heart sounds and intact distal pulses.   Pulmonary/Chest: Effort normal and breath sounds normal. She has no decreased breath sounds. She has no wheezes. She has no rhonchi. She has no rales. She exhibits bony tenderness. She exhibits no  deformity, no swelling and no retraction.    Abdominal: Soft. Bowel sounds are normal. There is no tenderness.  Musculoskeletal: Normal range of motion.       Right shoulder: Normal.       Right elbow: Normal. Neurological: She is alert.  Skin: Skin is warm and dry.  Psychiatric: She has a normal mood and affect.    ED Course   Procedures (including critical care time)  Labs Reviewed - No data to display No results found. 1. Chest wall contusion, right, initial encounter     MDM  xrays reviewed prior to dc.  Pt prescribed hydrocodone,  Encouraged ice packs x 2 days, then add heat tx on day 3.  Prn f/u anticipated.  The patient appears reasonably screened and/or stabilized for discharge and I doubt any other medical condition or other [facility] requiring further screening, evaluation, or treatment in the ED at this time prior to discharge.   Burgess Amor, PA-C 12/01/12 1514

## 2012-12-01 NOTE — ED Provider Notes (Signed)
Medical screening examination/treatment/procedure(s) were performed by non-physician practitioner and as supervising physician I was immediately available for consultation/collaboration.  Donnetta Hutching, MD 12/01/12 [phone]

## 2013-08-01 ENCOUNTER — Ambulatory Visit (HOSPITAL_COMMUNITY): Payer: Medicare Other

## 2013-08-02 ENCOUNTER — Ambulatory Visit (HOSPITAL_COMMUNITY)
Admission: RE | Admit: 2013-08-02 | Discharge: 2013-08-02 | Disposition: A | Discharge status: Home / Self Care | Payer: Medicare Other | Source: Ambulatory Visit | Attending: Orthopedic Surgery | Admitting: Orthopedic Surgery

## 2013-08-02 DIAGNOSIS — M25519 Pain in unspecified shoulder: Secondary | ICD-10-CM | POA: Insufficient documentation

## 2013-08-02 DIAGNOSIS — M6289 Other specified disorders of muscle: Secondary | ICD-10-CM | POA: Insufficient documentation

## 2013-08-02 DIAGNOSIS — IMO0001 Reserved for inherently not codable concepts without codable children: Secondary | ICD-10-CM | POA: Insufficient documentation

## 2013-08-02 DIAGNOSIS — M6281 Muscle weakness (generalized): Secondary | ICD-10-CM | POA: Insufficient documentation

## 2013-08-02 DIAGNOSIS — M629 Disorder of muscle, unspecified: Secondary | ICD-10-CM

## 2013-08-02 DIAGNOSIS — M25619 Stiffness of unspecified shoulder, not elsewhere classified: Secondary | ICD-10-CM | POA: Insufficient documentation

## 2013-08-02 DIAGNOSIS — M25612 Stiffness of left shoulder, not elsewhere classified: Secondary | ICD-10-CM | POA: Insufficient documentation

## 2013-08-02 NOTE — Evaluation (Signed)
Occupational Therapy Evaluation  Patient Details  Name: Deborah Murray MRN: [id number] Date of Birth: Feb 17, 1961  Today's Date: 08/02/2013 Time: 1572-6203 OT Time Calculation (min): 32 min Eval 1025-1047 (22') Manual 1047-1057 (10')  Visit#: 1 of 24  Re-eval: 08/30/13  Assessment Diagnosis: Left RCR Surgical Date: 06/30/13 Next MD Visit: May 4th Prior Therapy: After right RCR in 2014  Authorization: Medicare  Authorization Time Period: Before 10th Visit  Authorization Visit#: 1 of 10   Past Medical History:  Past Medical History  Diagnosis Date  . Asthma   . Hyperlipidemia   . Hypertension   . Syncope and collapse 05/11/11  adrenal insuffiency  treated at [facility]  . Thyroid disease    Past Surgical History:  Past Surgical History  Procedure Laterality Date  . Cardiac catheterization  2012 at [facility]  . 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 Abdominal hysterectomy    . Cholecystectomy, laparoscopic    . Eye surg for glaucoma    . Right knee surg    . Cholecystectomy      Subjective Symptoms/Limitations Symptoms: S: "It's the asthma thats bothering me today. I really haven't had that much pain." Pertinent History: pt is 53 yo presenting to outpatient OT s/p Left RCR on 06/30/13 (4 weeks ago).  Pt had right shoulder RCR approx 1 year prior.  pt states she believes she was overcompensating with her left during her right shoulder recovery, leading to increased pain.  Pt also states she fell on the left shoulder in February of this year. Limitations: MD Spang Protocol: 4-6 weeks (4/20-5/4): gentle AAROM/AROM, joint mobs      6-8 weeks (5/4-5/18):  AROM, deltoied and biceps strengthening      8-12 weeks (5/18-6/15):  scapular strengthening, ER/IR isometrics, posterior capsule stretch      12 weeks-5 months: increase flexibility/volocity of motion Special Tests: FOTO 71/100 (29% limited) Patient Stated Goals: "Getting back to what i'm used to doing." Pain  Assessment Currently in Pain?: No/denies  Precautions/Restrictions  Precautions Precautions: Shoulder Type of Shoulder Precautions: See Scanned Protocol Required Braces or Orthoses:  (D/C/ sling week 4)  Balance Screening Balance Screen Has the patient fallen in the past 6 months: Yes (assisting daughter after she fell on ice) How many times?: 1 Has the patient had a decrease in activity level because of a fear of falling? : No Is the patient reluctant to leave their home because of a fear of falling? : No  Prior Functioning  Home Living Family/patient expects to be discharged to:: Private residence Living Arrangements: Alone Prior Function Level of Independence: Independent with basic ADLs;Independent with homemaking with ambulation Driving: Yes Vocation: On disability Vocation Requirements: Disability for asthma Leisure: Hobbies-yes (Comment) Comments: rading, planting flowers, car trips  Assessment ADL/Vision/Perception ADL ADL Comments: Per protocol, pt currenlty cannot complete ADLs above waist heihgt. pt in particular mentioned havving difficulty with doing her hair, gettin dressed, donning a bra, tolet hygiene, and getting comfortable to sleep. Dominant Hand: Right Vision - History Baseline Vision: No visual deficits  Cognition/Observation Cognition Overall Cognitive Status: Within Functional Limits for tasks assessed Arousal/Alertness: Awake/alert Orientation Level: Oriented X4 Observation/Other Assessments Observations: Pt has 5 small incisions along lateral endge of shoulder. incisions have healed well. Instructed pt in scar massage technique.  Sensation/Coordination/Edema Sensation Light Touch: Appears Intact Coordination Gross Motor Movements are Fluid and Coordinated: Not tested Fine Motor Movements are Fluid and Coordinated: Yes  Additional Assessments LUE AROM (degrees)  LUE Overall AROM Comments: Assessed in supine, with shoulder adducted for ER/IR   Left Shoulder Flexion: 150 Degrees Left Shoulder ABduction: 180 Degrees Left Shoulder Internal Rotation: 90 Degrees Left Shoulder External Rotation: 80 Degrees LUE PROM (degrees) LUE Overall PROM Comments: Assessed in supine, with shoulder adducted for ER/IR Left Shoulder Flexion: 152 Degrees Left Shoulder ABduction: 180 Degrees Left Shoulder Internal Rotation: 90 Degrees Left Shoulder External Rotation: 60 Degrees Palpation Palpation: Pt has max fascial restriction sin scapular region and mod restirctions in upper trap, bicep, and anterior shoulder regions.       Manual Therapy Manual Therapy: Myofascial release Myofascial Release: MFR to left bicep, anterior shoulder, upper trap, and scapular regions to decrease fascial restrictions and promote imporved ROM.  Scar massage to incision sites.  Occupational Therapy Assessment and Plan OT Assessment and Plan Clinical Impression Statement: Pt is presenting to outpatient OT s/p L RCR.  She currenlty has decreased ROM, increased fascial restrictions, decreased strength, and increased pain with end range movements. Pt will benefit from skilled therapeutic intervention in order to improve on the following deficits: Impaired UE functional use;Increased fascial restricitons;Decreased range of motion;Decreased strength Rehab Potential: Excellent OT Frequency: Min 2X/week OT Duration: 12 weeks OT Treatment/Interventions: Self-care/ADL training;Therapeutic exercise;Manual therapy;Modalities;Therapeutic activities;Patient/family education OT Plan: Pt will benefit from skilled OT services to increase ROM, incrase strenght, decrease fascial restrictions, decrease pain, and impove LUE functional use.       Treatment Plan: Myofascial release (MFR) and manual stretching, PROM, AAROM, AROM, scapular stablilization, proximal shoulder strengthening,, general strengthening   Goals Home Exercise Program Pt/caregiver will Perform Home Exercise Program: For  increased ROM;For increased strengthening PT Goal: Perform Home Exercise Program - Progress: Goal set today Short Term Goals Time to Complete Short Term Goals: 6 weeks Short Term Goal 1: Pt will be educated on HEP. Short Term Goal 2: Pt will attain PROM WNL for improved ability to fix her hair. Short Term Goal 3: Pt will achieve strength of atleast 4-/5 in left shoulder. Short Term Goal 4: Pt will decrease fascial restrictions to min-mod in left shoulder region Short Term Goal 5: Pt will have pain of less than 3/10 with end range movements Long Term Goals Long Term Goal 1: Pt will ahcieve prior level of functioning in all ADL and IADL tasks. Long Term Goal 2: Pt wil achieve AROM WNL for improved ability to fix her hair. Long Term Goal 3: Pt will have strength of 5/5 in left shoulder. Long Term Goal 4: Pt will decrease fascial restrictions in left shoulder to less than minimal for improved AROM. Long Term Goal 5: Pt will have overall left shoulder pain of less than 1/10. Additional Long Term Goals?: Yes Long Term Goal 6: Pt will verbalize ability to don her bra in 5/5 trials.  Problem List Patient Active Problem List   Diagnosis Date Noted  . Pain in joint, shoulder region 08/02/2013  . Muscle weakness (generalized) 08/02/2013  . Decreased range of motion of left shoulder 08/02/2013  . Tight fascia 08/02/2013    End of Session Activity Tolerance: Patient tolerated treatment well General Behavior During Therapy: [facility] for tasks assessed/performed OT Plan of Care OT Home Exercise Plan: Towel slides OT Patient Instructions: handout provided (scanned) Consulted and Agree with Plan of Care: Patient  GO Functional Assessment Tool Used: FOTO 71/100 (29% limited) Functional Limitation: Carrying, moving and handling objects Carrying, Moving and Handling Objects Current Status ([id number]): At least 20 percent but less than 40 percent impaired, limited  or restricted Carrying, Moving and  Handling Objects Goal Status [phone]): At least 1 percent but less than 20 percent impaired, limited or restricted  Bea Graff, [place], OTR/L [phone]  08/02/2013, 12:37 PM   Physician Documentation Your signature is required to indicate approval of the treatment plan as stated above.  Please sign and either send electronically or make a copy of this report for your files and return this physician signed original.  Please mark one 1.__approve of plan  2. ___approve of plan with the following conditions.   ______________________________                                                          _____________________ Physician Signature                                                                                                             Date

## 2013-08-09 ENCOUNTER — Ambulatory Visit (HOSPITAL_COMMUNITY)
Admission: RE | Admit: 2013-08-09 | Discharge: 2013-08-09 | Disposition: A | Discharge status: Home / Self Care | Payer: Medicare Other | Source: Ambulatory Visit | Attending: Orthopedic Surgery | Admitting: Orthopedic Surgery

## 2013-08-09 DIAGNOSIS — M6281 Muscle weakness (generalized): Secondary | ICD-10-CM

## 2013-08-09 DIAGNOSIS — M6289 Other specified disorders of muscle: Secondary | ICD-10-CM

## 2013-08-09 DIAGNOSIS — M25519 Pain in unspecified shoulder: Secondary | ICD-10-CM

## 2013-08-09 DIAGNOSIS — M629 Disorder of muscle, unspecified: Secondary | ICD-10-CM

## 2013-08-09 NOTE — Progress Notes (Signed)
Occupational Therapy Treatment Patient Details  Name: Deborah Murray MRN: [id number] Date of Birth: 11-25-1960  Today's Date: 08/09/2013 Time: 4098-1191 OT Time Calculation (min): 35 min MFR [phone] 12' Therex 4782-9562 23'  Visit#: 2 of 24  Re-eval: 08/30/13    Authorization: Medicare  Authorization Time Period: Before 10th Visit  Authorization Visit#: 2 of 10  Subjective Symptoms/Limitations Symptoms: S: I took a pain pill this morning so it's not bothering me right now.  Limitations: MD Spang Protocol: 4-6 weeks (4/20-5/4): gentle AAROM/AROM, joint mobs      6-8 weeks (5/4-5/18):  AROM, deltoied and biceps strengthening      8-12 weeks (5/18-6/15):  scapular strengthening, ER/IR isometrics, posterior capsule stretch      12 weeks-5 months: increase flexibility/volocity of motion Pain Assessment Currently in Pain?: No/denies  Precautions/Restrictions  Precautions Precautions: Shoulder Type of Shoulder Precautions: See Scanned Protocol  Exercise/Treatments Supine Protraction: PROM;10 reps Horizontal ABduction: PROM;10 reps External Rotation: PROM;10 reps Internal Rotation: PROM;10 reps Flexion: PROM;10 reps ABduction: PROM;10 reps Seated Elevation: AROM;12 reps Extension: AROM;12 reps Row: AROM;12 reps Therapy Ball Flexion: 15 reps ABduction: 15 reps ROM / Strengthening / Isometric Strengthening Thumb Tacks: 1' Prot/Ret//Elev/Dep: 1'        Manual Therapy Manual Therapy: Myofascial release Myofascial Release: MFR to left bicep, anterior shoulder, upper trap, and scapular regions to decrease fascial restrictions and promote imporved ROM. Scar massage to incision sites.  Occupational Therapy Assessment and Plan OT Assessment and Plan Clinical Impression Statement: A: Patient presents with great passive ROM with very little pain. No difficulty completing exercises this date.  OT Plan: P: Increase reps with therapy ball. continue with PROM in supine and  progress to AAROM next week.    Goals Short Term Goals Time to Complete Short Term Goals: 6 weeks Short Term Goal 1: Pt will be educated on HEP. Short Term Goal 1 Progress: Progressing toward goal Short Term Goal 2: Pt will attain PROM WNL for improved ability to fix her hair. Short Term Goal 2 Progress: Progressing toward goal Short Term Goal 3: Pt will achieve strength of atleast 4-/5 in left shoulder. Short Term Goal 3 Progress: Progressing toward goal Short Term Goal 4: Pt will decrease fascial restrictions to min-mod in left shoulder region Short Term Goal 4 Progress: Progressing toward goal Short Term Goal 5: Pt will have pain of less than 3/10 with end range movements Short Term Goal 5 Progress: Progressing toward goal Long Term Goals Time to Complete Long Term Goals: 12 weeks Long Term Goal 1: Pt will ahcieve prior level of functioning in all ADL and IADL tasks. Long Term Goal 1 Progress: Other (comment) Long Term Goal 2: Pt wil achieve AROM WNL for improved ability to fix her hair. Long Term Goal 2 Progress: Progressing toward goal Long Term Goal 3: Pt will have strength of 5/5 in left shoulder. Long Term Goal 3 Progress: Progressing toward goal Long Term Goal 4: Pt will decrease fascial restrictions in left shoulder to less than minimal for improved AROM. Long Term Goal 4 Progress: Progressing toward goal Long Term Goal 5: Pt will have overall left shoulder pain of less than 1/10. Long Term Goal 5 Progress: Progressing toward goal Additional Long Term Goals?: Yes Long Term Goal 6: Pt will verbalize ability to don her bra in 5/5 trials. Long Term Goal 6 Progress: Progressing toward goal  Problem List Patient Active Problem List   Diagnosis Date Noted  . Pain in joint, shoulder region 08/02/2013  .  Muscle weakness (generalized) 08/02/2013  . Decreased range of motion of left shoulder 08/02/2013  . Tight fascia 08/02/2013    End of Session Activity Tolerance: Patient  tolerated treatment well General Behavior During Therapy: [facility] for tasks assessed/performed   Ailene Ravel, OTR/L,CBIS   08/09/2013, 10:43 AM

## 2013-08-11 ENCOUNTER — Inpatient Hospital Stay (HOSPITAL_COMMUNITY)
Admission: RE | Admit: 2013-08-11 | Discharge: 2013-08-11 | Disposition: A | Discharge status: Home / Self Care | Payer: Medicare Other | Source: Ambulatory Visit

## 2013-08-11 ENCOUNTER — Ambulatory Visit (HOSPITAL_COMMUNITY)
Admission: RE | Admit: 2013-08-11 | Discharge: 2013-08-11 | Disposition: A | Discharge status: Home / Self Care | Payer: Medicare Other | Source: Ambulatory Visit | Attending: Orthopedic Surgery | Admitting: Orthopedic Surgery

## 2013-08-11 DIAGNOSIS — M6281 Muscle weakness (generalized): Secondary | ICD-10-CM | POA: Diagnosis not present

## 2013-08-11 DIAGNOSIS — M25619 Stiffness of unspecified shoulder, not elsewhere classified: Secondary | ICD-10-CM | POA: Insufficient documentation

## 2013-08-11 DIAGNOSIS — IMO0001 Reserved for inherently not codable concepts without codable children: Secondary | ICD-10-CM | POA: Diagnosis not present

## 2013-08-11 DIAGNOSIS — M25519 Pain in unspecified shoulder: Secondary | ICD-10-CM | POA: Diagnosis not present

## 2013-08-11 NOTE — Progress Notes (Signed)
Occupational Therapy Treatment Patient Details  Name: Deborah Murray MRN: [id number] Date of Birth: 1961/02/18  Today's Date: 08/11/2013 Time: 1025-1108 OT Time Calculation (min): 43 min Manual 1025-1040 (15') Therapeutic Exercsiees 1040-1108 (28')  Visit#: 3 of 24  Re-eval: 08/30/13    Authorization: Medicare  Authorization Time Period: Before 10th Visit  Authorization Visit#: 3 of 10  Subjective Symptoms/Limitations Symptoms: "If you'd asked me last night I would have said yes!" (about the pain) Limitations: MD Spang Protocol: 4-6 weeks (4/20-5/4): gentle AAROM/AROM, joint mobs      6-8 weeks (5/4-5/18):  AROM, deltoied and biceps strengthening      8-12 weeks (5/18-6/15):  scapular strengthening, ER/IR isometrics, posterior capsule stretch      12 weeks-5 months: increase flexibility/volocity of motion Pain Assessment Currently in Pain?: No/denies  Precautions/Restrictions     Exercise/Treatments Supine Protraction: PROM;10 reps;AAROM;5 reps Horizontal ABduction: PROM;10 reps;AAROM;5 reps External Rotation: PROM;10 reps;AAROM;5 reps Internal Rotation: PROM;10 reps;AAROM;5 reps Flexion: PROM;10 reps;AAROM;5 reps ABduction: PROM;10 reps;AAROM;5 reps Seated Elevation: AROM;15 reps Extension: AROM;15 reps Row: AROM;15 reps Other Seated Exercises: Elbow flexion/extension x10 reps Therapy Ball Flexion: 20 reps ABduction: 20 reps Manual Therapy Manual Therapy: Myofascial release Myofascial Release: MFR to left bicep, anterior shoulder, upper trap, and scapular regions to decrease fascial restrictions and promote imporved ROM. Scar massage to incision sites.    Occupational Therapy Assessment and Plan OT Assessment and Plan Clinical Impression Statement: Pt has good PROM. Added AAROM in supine this session - pt had very good tolerance with introduction of few reps, but did verbalize 'feeling' the stretch.  Increaed ball stretch reps, with good tolerance.  pt mentioned  tightness in elbow with ball stretch - added elbow flexion/extenion, and suggested for HEP. OT Plan: continue with PROM and AAROM in supine. Increase AAROM reps to 10.   Goals Short Term Goals Short Term Goal 1: Pt will be educated on HEP. Short Term Goal 1 Progress: Progressing toward goal Short Term Goal 2: Pt will attain PROM WNL for improved ability to fix her hair. Short Term Goal 2 Progress: Progressing toward goal Short Term Goal 3: Pt will achieve strength of atleast 4-/5 in left shoulder. Short Term Goal 3 Progress: Progressing toward goal Short Term Goal 4: Pt will decrease fascial restrictions to min-mod in left shoulder region Short Term Goal 4 Progress: Progressing toward goal Short Term Goal 5: Pt will have pain of less than 3/10 with end range movements Short Term Goal 5 Progress: Progressing toward goal Long Term Goals Long Term Goal 1: Pt will ahcieve prior level of functioning in all ADL and IADL tasks. Long Term Goal 1 Progress: Progressing toward goal Long Term Goal 2: Pt wil achieve AROM WNL for improved ability to fix her hair. Long Term Goal 2 Progress: Progressing toward goal Long Term Goal 3: Pt will have strength of 5/5 in left shoulder. Long Term Goal 3 Progress: Progressing toward goal Long Term Goal 4: Pt will decrease fascial restrictions in left shoulder to less than minimal for improved AROM. Long Term Goal 4 Progress: Progressing toward goal Long Term Goal 5: Pt will have overall left shoulder pain of less than 1/10. Long Term Goal 5 Progress: Progressing toward goal Long Term Goal 6: Pt will verbalize ability to don her bra in 5/5 trials. Long Term Goal 6 Progress: Progressing toward goal  Problem List Patient Active Problem List   Diagnosis Date Noted  . Pain in joint, shoulder region 08/02/2013  . Muscle weakness (generalized)  08/02/2013  . Decreased range of motion of left shoulder 08/02/2013  . Tight fascia 08/02/2013    End of  Session Activity Tolerance: Patient tolerated treatment well General Behavior During Therapy: [facility] for tasks assessed/performed  GO    Bea Graff, MS, OTR/L [phone]  08/11/2013, 11:08 AM

## 2013-08-15 ENCOUNTER — Ambulatory Visit (HOSPITAL_COMMUNITY): Payer: Medicare Other

## 2013-08-17 ENCOUNTER — Ambulatory Visit (HOSPITAL_COMMUNITY)
Admission: RE | Admit: 2013-08-17 | Discharge: 2013-08-17 | Disposition: A | Discharge status: Home / Self Care | Payer: Medicare Other | Source: Ambulatory Visit | Attending: Orthopedic Surgery | Admitting: Orthopedic Surgery

## 2013-08-17 DIAGNOSIS — IMO0001 Reserved for inherently not codable concepts without codable children: Secondary | ICD-10-CM | POA: Diagnosis not present

## 2013-08-17 NOTE — Progress Notes (Signed)
Occupational Therapy Treatment Patient Details  Name: Deborah Murray MRN: [id number] Date of Birth: 1961-04-09  Today's Date: 08/17/2013 Time: 8250-5397 OT Time Calculation (min): 39 min Manual [phone] (23') Therapeutic Exercises 1000-1016 (16')  Visit#: 4 of 24  Re-eval: 08/30/13    Authorization: Medicare  Authorization Time Period: Before 10th Visit  Authorization Visit#: 4 of 10  Subjective Symptoms/Limitations Symptoms: "I don't have my sling! i took it off Monday. No extra pain." Limitations: MD Spang Protocol: 4-6 weeks (4/20-5/4): gentle AAROM/AROM, joint mobs      6-8 weeks (5/4-5/18):  AROM, deltoied and biceps strengthening      8-12 weeks (5/18-6/15):  scapular strengthening, ER/IR isometrics, posterior capsule stretch      12 weeks-5 months: increase flexibility/volocity of motion Pain Assessment Currently in Pain?: No/denies  Precautions/Restrictions     Exercise/Treatments Supine Protraction: PROM;AAROM;10 reps Horizontal ABduction: PROM;AAROM;10 reps External Rotation: PROM;AAROM;10 reps Internal Rotation: PROM;AAROM;10 reps Flexion: PROM;AAROM;10 reps ABduction: PROM;AAROM;10 reps Seated Elevation: AROM;15 reps Extension: AROM;15 reps Row: AROM;15 reps ROM / Strengthening / Isometric Strengthening Thumb Tacks: 1' Prot/Ret//Elev/Dep: 1'  Manual Therapy Manual Therapy: Myofascial release Myofascial Release: MFR to left bicep, anterior shoulder, upper trap, and scapular regions to decrease fascial restrictions and promote imporved ROM. Scar massage to incision sites.    Occupational Therapy Assessment and Plan OT Assessment and Plan Clinical Impression Statement: Increased AAROM supine reps with good tolerance.  Pt had good tolerance of all exercisies. OT Plan: Attempt AAROM in sitting, Continue in supine. Add scapular theraband.   Goals Short Term Goals Short Term Goal 1: Pt will be educated on HEP. Short Term Goal 1 Progress: Progressing toward  goal Short Term Goal 2: Pt will attain PROM WNL for improved ability to fix her hair. Short Term Goal 2 Progress: Progressing toward goal Short Term Goal 3: Pt will achieve strength of atleast 4-/5 in left shoulder. Short Term Goal 3 Progress: Progressing toward goal Short Term Goal 4: Pt will decrease fascial restrictions to min-mod in left shoulder region Short Term Goal 4 Progress: Progressing toward goal Short Term Goal 5: Pt will have pain of less than 3/10 with end range movements Short Term Goal 5 Progress: Progressing toward goal Long Term Goals Long Term Goal 1: Pt will ahcieve prior level of functioning in all ADL and IADL tasks. Long Term Goal 1 Progress: Progressing toward goal Long Term Goal 2: Pt wil achieve AROM WNL for improved ability to fix her hair. Long Term Goal 2 Progress: Progressing toward goal Long Term Goal 3: Pt will have strength of 5/5 in left shoulder. Long Term Goal 3 Progress: Progressing toward goal Long Term Goal 4: Pt will decrease fascial restrictions in left shoulder to less than minimal for improved AROM. Long Term Goal 4 Progress: Progressing toward goal Long Term Goal 5: Pt will have overall left shoulder pain of less than 1/10. Long Term Goal 5 Progress: Progressing toward goal Long Term Goal 6: Pt will verbalize ability to don her bra in 5/5 trials. Long Term Goal 6 Progress: Progressing toward goal  Problem List Patient Active Problem List   Diagnosis Date Noted  . Pain in joint, shoulder region 08/02/2013  . Muscle weakness (generalized) 08/02/2013  . Decreased range of motion of left shoulder 08/02/2013  . Tight fascia 08/02/2013    End of Session Activity Tolerance: Patient tolerated treatment well General Behavior During Therapy: [facility] for tasks assessed/performed  GO    Bea Graff, [place], OTR/L [phone]  08/17/2013,  10:39 AM

## 2013-08-18 ENCOUNTER — Ambulatory Visit (HOSPITAL_COMMUNITY)
Admission: RE | Admit: 2013-08-18 | Discharge: 2013-08-18 | Disposition: A | Discharge status: Home / Self Care | Payer: Medicare Other | Source: Ambulatory Visit | Attending: Orthopedic Surgery | Admitting: Orthopedic Surgery

## 2013-08-18 DIAGNOSIS — M629 Disorder of muscle, unspecified: Secondary | ICD-10-CM

## 2013-08-18 DIAGNOSIS — M6289 Other specified disorders of muscle: Secondary | ICD-10-CM

## 2013-08-18 DIAGNOSIS — IMO0001 Reserved for inherently not codable concepts without codable children: Secondary | ICD-10-CM | POA: Diagnosis not present

## 2013-08-18 DIAGNOSIS — M25519 Pain in unspecified shoulder: Secondary | ICD-10-CM

## 2013-08-18 DIAGNOSIS — M6281 Muscle weakness (generalized): Secondary | ICD-10-CM

## 2013-08-18 NOTE — Progress Notes (Signed)
Occupational Therapy Treatment Patient Details  Name: Deborah Murray MRN: [id number] Date of Birth: 01/02/1961  Today's Date: 08/18/2013 Time: 7482-7078 OT Time Calculation (min): 41 min Manual therapy 6754-4920 19' Therapeutic exercises 1048-1110 22'  Visit#: 5 of 24  Re-eval: 08/30/13    Authorization: Medicare  Authorization Time Period: Before 10th Visit  Authorization Visit#: 5 of 10  Subjective Symptoms/Limitations Symptoms: S:  Its feeling alot better.  Im using it more around the house. Limitations: MD Spang Protocol: 4-6 weeks (4/20-5/4): gentle AAROM/AROM, joint mobs      6-8 weeks (5/4-5/18):  AROM, deltoied and biceps strengthening      8-12 weeks (5/18-6/15):  scapular strengthening, ER/IR isometrics, posterior capsule stretch      12 weeks-5 months: increase flexibility/volocity of motion  Precautions/Restrictions    rotocol: 4-6 weeks (4/20-5/4): gentle AAROM/AROM, joint mobs      6-8 weeks (5/4-5/18):  AROM, deltoied and biceps strengthening      8-12 weeks (5/18-6/15):  scapular strengthening, ER/IR isometrics, posterior capsule stretch      12 weeks-5 months: increase flexibility/volocity of motion   Exercise/Treatments Supine Protraction: PROM;AROM;10 reps;AAROM;15 reps Horizontal ABduction: PROM;AROM;10 reps;AAROM;15 reps External Rotation: PROM;AROM;10 reps;AAROM;15 reps Internal Rotation: PROM;AROM;10 reps;AAROM;15 reps Flexion: PROM;AROM;10 reps;AAROM;15 reps ABduction: PROM;AROM;10 reps;AAROM;15 reps Seated Elevation: AROM;15 reps Extension: AROM;15 reps Row: AROM;15 reps Protraction: AAROM;10 reps Horizontal ABduction: AAROM;10 reps External Rotation: AAROM;10 reps Internal Rotation: AAROM;10 reps Flexion: AAROM;10 reps Abduction: AAROM;10 reps Therapy Ball Flexion: 20 reps ABduction: 20 reps ROM / Strengthening / Isometric Strengthening Thumb Tacks: resume next visit Prot/Ret//Elev/Dep: resume next visit       Manual Therapy Manual  Therapy: Myofascial release Myofascial Release: Myofascial release (MFR) to left upper arm, anterior shoulder, upper trap, and scapular regions to decrease fascial restrictions and promote imporved ROM. Scar massage to incision sites.   Occupational Therapy Assessment and Plan OT Assessment and Plan Clinical Impression Statement: A:  Added AROM in supine and AAROm in seated with good from. OT Plan: P:  transition ext, ret, row to theraband.   Goals Short Term Goals Short Term Goal 1: Pt will be educated on HEP. Short Term Goal 2: Pt will attain PROM WNL for improved ability to fix her hair. Short Term Goal 3: Pt will achieve strength of atleast 4-/5 in left shoulder. Short Term Goal 4: Pt will decrease fascial restrictions to min-mod in left shoulder region Short Term Goal 5: Pt will have pain of less than 3/10 with end range movements Long Term Goals Long Term Goal 1: Pt will ahcieve prior level of functioning in all ADL and IADL tasks. Long Term Goal 2: Pt wil achieve AROM WNL for improved ability to fix her hair. Long Term Goal 3: Pt will have strength of 5/5 in left shoulder. Long Term Goal 4: Pt will decrease fascial restrictions in left shoulder to less than minimal for improved AROM. Long Term Goal 5: Pt will have overall left shoulder pain of less than 1/10. Long Term Goal 6: Pt will verbalize ability to don her bra in 5/5 trials.  Problem List Patient Active Problem List   Diagnosis Date Noted  . Pain in joint, shoulder region 08/02/2013  . Muscle weakness (generalized) 08/02/2013  . Decreased range of motion of left shoulder 08/02/2013  . Tight fascia 08/02/2013    End of Session Activity Tolerance: Patient tolerated treatment well General Behavior During Therapy: [facility] for tasks assessed/performed  GO    Shirlean Mylar, OTR/L [phone]  08/18/2013,  12:13 PM

## 2013-08-21 ENCOUNTER — Ambulatory Visit (HOSPITAL_COMMUNITY)
Admission: RE | Admit: 2013-08-21 | Discharge: 2013-08-21 | Disposition: A | Discharge status: Home / Self Care | Payer: Medicare Other | Source: Ambulatory Visit | Attending: Orthopedic Surgery | Admitting: Orthopedic Surgery

## 2013-08-21 DIAGNOSIS — M629 Disorder of muscle, unspecified: Secondary | ICD-10-CM

## 2013-08-21 DIAGNOSIS — IMO0001 Reserved for inherently not codable concepts without codable children: Secondary | ICD-10-CM | POA: Diagnosis not present

## 2013-08-21 DIAGNOSIS — M6281 Muscle weakness (generalized): Secondary | ICD-10-CM

## 2013-08-21 DIAGNOSIS — M25519 Pain in unspecified shoulder: Secondary | ICD-10-CM

## 2013-08-21 DIAGNOSIS — M6289 Other specified disorders of muscle: Secondary | ICD-10-CM

## 2013-08-21 NOTE — Progress Notes (Signed)
Occupational Therapy Treatment Patient Details  Name: Deborah Murray MRN: [id number] Date of Birth: Aug 04, 1960  Today's Date: 08/21/2013 Time: 2353-6144 OT Time Calculation (min): 38 min MFR 315-400 15' Therex 867-619 23'  Visit#: 6 of 24  Re-eval: 08/30/13    Authorization: Medicare  Authorization Time Period: Before 10th Visit  Authorization Visit#: 6 of 10  Subjective Symptoms/Limitations Symptoms: S: I can feel the stretch in the my shoulder blades. Pain Assessment Currently in Pain?: Yes Pain Score: 2  Pain Location: Shoulder Pain Orientation: Left Pain Type: Acute pain  Precautions/Restrictions  Precautions Precautions: Shoulder Type of Shoulder Precautions: See Scanned Protocol  Exercise/Treatments Supine Protraction: PROM;10 reps;AROM;12 reps Horizontal ABduction: PROM;10 reps;AROM;12 reps External Rotation: PROM;10 reps;AROM;12 reps Internal Rotation: PROM;10 reps;AROM;12 reps Flexion: PROM;10 reps;AROM;12 reps ABduction: PROM;10 reps;AROM;12 reps Seated Protraction: AAROM;12 reps Horizontal ABduction: AAROM;12 reps External Rotation: AAROM;12 reps Internal Rotation: AAROM;12 reps Flexion: AAROM;12 reps Abduction: AAROM;12 reps Standing Extension: Theraband;10 reps Theraband Level (Shoulder Extension): Level 2 (Red) Row: Theraband;10 reps Theraband Level (Shoulder Row): Level 2 (Red) Therapy Ball Flexion: 20 reps ABduction: 20 reps Right/Left: 5 reps ROM / Strengthening / Isometric Strengthening Thumb Tacks: 1' Prot/Ret//Elev/Dep: 1'       Manual Therapy Manual Therapy: Myofascial release Myofascial Release: Myofascial release (MFR) to left upper arm, anterior shoulder, upper trap, and scapular regions to decrease fascial restrictions and promote imporved ROM. Scar massage to incision sites.  Occupational Therapy Assessment and Plan OT Assessment and Plan Clinical Impression Statement: A: Added theraband for ext and row. Added left/right  with therapy ball. Patient tolerated well with good form. Required one rest break during thumb tacks. OT Plan: P: Work on completing thumb tacks without rest breaks by strengthening scapular stability.    Goals Short Term Goals Time to Complete Short Term Goals: 6 weeks Short Term Goal 1: Pt will be educated on HEP. Short Term Goal 1 Progress: Progressing toward goal Short Term Goal 2: Pt will attain PROM WNL for improved ability to fix her hair. Short Term Goal 2 Progress: Progressing toward goal Short Term Goal 3: Pt will achieve strength of atleast 4-/5 in left shoulder. Short Term Goal 3 Progress: Progressing toward goal Short Term Goal 4: Pt will decrease fascial restrictions to min-mod in left shoulder region Short Term Goal 4 Progress: Progressing toward goal Short Term Goal 5: Pt will have pain of less than 3/10 with end range movements Short Term Goal 5 Progress: Progressing toward goal Long Term Goals Long Term Goal 1: Pt will ahcieve prior level of functioning in all ADL and IADL tasks. Long Term Goal 1 Progress: Progressing toward goal Long Term Goal 2: Pt wil achieve AROM WNL for improved ability to fix her hair. Long Term Goal 2 Progress: Progressing toward goal Long Term Goal 3: Pt will have strength of 5/5 in left shoulder. Long Term Goal 3 Progress: Progressing toward goal Long Term Goal 4: Pt will decrease fascial restrictions in left shoulder to less than minimal for improved AROM. Long Term Goal 4 Progress: Progressing toward goal Long Term Goal 5: Pt will have overall left shoulder pain of less than 1/10. Long Term Goal 5 Progress: Progressing toward goal Long Term Goal 6: Pt will verbalize ability to don her bra in 5/5 trials. Long Term Goal 6 Progress: Progressing toward goal  Problem List Patient Active Problem List   Diagnosis Date Noted  . Pain in joint, shoulder region 08/02/2013  . Muscle weakness (generalized) 08/02/2013  . Decreased range  of motion of  left shoulder 08/02/2013  . Tight fascia 08/02/2013    End of Session Activity Tolerance: Patient tolerated treatment well General Behavior During Therapy: [facility] for tasks assessed/performed    Ailene Ravel, OTR/L,CBIS   08/21/2013, 8:44 AM

## 2013-08-24 ENCOUNTER — Ambulatory Visit (HOSPITAL_COMMUNITY)
Admission: RE | Admit: 2013-08-24 | Discharge: 2013-08-24 | Disposition: A | Discharge status: Home / Self Care | Payer: Medicare Other | Source: Ambulatory Visit | Attending: Orthopedic Surgery | Admitting: Orthopedic Surgery

## 2013-08-24 DIAGNOSIS — IMO0001 Reserved for inherently not codable concepts without codable children: Secondary | ICD-10-CM | POA: Diagnosis not present

## 2013-08-24 NOTE — Progress Notes (Signed)
Occupational Therapy Treatment Patient Details  Name: Deborah Murray MRN: [id number] Date of Birth: 1960/11/04  Today's Date: 08/24/2013 Time: 1025-1100 OT Time Calculation (min): 35 min MFR 1025-1140 15' Therex 1140-1100 20'  Visit#: 7 of 24  Re-eval: 08/30/13    Authorization: Medicare  Authorization Time Period: Before 10th Visit  Authorization Visit#: 7 of 10  Subjective Symptoms/Limitations Symptoms: S: My shoulder feels really good today. No pain.  Pain Assessment Currently in Pain?: No/denies  Precautions/Restrictions  Precautions Precautions: Shoulder Type of Shoulder Precautions: See Scanned Protocol  Exercise/Treatments Supine Protraction: PROM;10 reps;AROM;12 reps Horizontal ABduction: PROM;10 reps;AROM;12 reps External Rotation: PROM;10 reps;AROM;12 reps Internal Rotation: PROM;10 reps;AROM;12 reps Flexion: PROM;10 reps;AROM;12 reps ABduction: PROM;10 reps;AROM;12 reps Seated Elevation: AROM;15 reps Extension: AROM;15 reps Row: AROM;15 reps Protraction: AAROM;12 reps Horizontal ABduction: AAROM;12 reps External Rotation: AAROM;12 reps Internal Rotation: AAROM;12 reps Flexion: AAROM;12 reps Abduction: AAROM;12 reps Standing Extension: Theraband;12 reps Theraband Level (Shoulder Extension): Level 2 (Red) Row: Theraband;12 reps Theraband Level (Shoulder Row): Level 2 (Red) ROM / Strengthening / Isometric Strengthening Thumb Tacks: 1' (pt dropped arm with 3 seconds to go) Proximal Shoulder Strengthening, Supine: 10X       Manual Therapy Manual Therapy: Myofascial release Myofascial Release: Myofascial release (MFR) to left upper arm, anterior shoulder, upper trap, and scapular regions to decrease fascial restrictions and promote imporved ROM. Scar massage to incision sites.  Occupational Therapy Assessment and Plan OT Assessment and Plan Clinical Impression Statement: A: Pt dropped arms during thumb tacks with only 3 seconds to go.  OT Plan:  P: Increase reps with AROM supine.   Goals Short Term Goals Time to Complete Short Term Goals: 6 weeks Short Term Goal 1: Pt will be educated on HEP. Short Term Goal 2: Pt will attain PROM WNL for improved ability to fix her hair. Short Term Goal 3: Pt will achieve strength of atleast 4-/5 in left shoulder. Short Term Goal 4: Pt will decrease fascial restrictions to min-mod in left shoulder region Short Term Goal 5: Pt will have pain of less than 3/10 with end range movements Long Term Goals Long Term Goal 1: Pt will ahcieve prior level of functioning in all ADL and IADL tasks. Long Term Goal 2: Pt wil achieve AROM WNL for improved ability to fix her hair. Long Term Goal 3: Pt will have strength of 5/5 in left shoulder. Long Term Goal 4: Pt will decrease fascial restrictions in left shoulder to less than minimal for improved AROM. Long Term Goal 5: Pt will have overall left shoulder pain of less than 1/10. Long Term Goal 6: Pt will verbalize ability to don her bra in 5/5 trials.  Problem List Patient Active Problem List   Diagnosis Date Noted  . Pain in joint, shoulder region 08/02/2013  . Muscle weakness (generalized) 08/02/2013  . Decreased range of motion of left shoulder 08/02/2013  . Tight fascia 08/02/2013    End of Session Activity Tolerance: Patient tolerated treatment well General Behavior During Therapy: [facility] for tasks assessed/performed   Ailene Ravel, OTR/L,CBIS   08/24/2013, 11:58 AM

## 2013-08-29 ENCOUNTER — Ambulatory Visit (HOSPITAL_COMMUNITY)
Admission: RE | Admit: 2013-08-29 | Discharge: 2013-08-29 | Disposition: A | Discharge status: Home / Self Care | Payer: Medicare Other | Source: Ambulatory Visit | Attending: Orthopedic Surgery | Admitting: Orthopedic Surgery

## 2013-08-29 DIAGNOSIS — M629 Disorder of muscle, unspecified: Secondary | ICD-10-CM

## 2013-08-29 DIAGNOSIS — M25519 Pain in unspecified shoulder: Secondary | ICD-10-CM

## 2013-08-29 DIAGNOSIS — M6281 Muscle weakness (generalized): Secondary | ICD-10-CM

## 2013-08-29 DIAGNOSIS — IMO0001 Reserved for inherently not codable concepts without codable children: Secondary | ICD-10-CM | POA: Diagnosis not present

## 2013-08-29 DIAGNOSIS — M6289 Other specified disorders of muscle: Secondary | ICD-10-CM

## 2013-08-29 NOTE — Evaluation (Signed)
Occupational Therapy Reassessment and Treatment   Patient Details  Name: Deborah Murray MRN: [id number] Date of Birth: Jan 03, 1961  Today's Date: 08/29/2013 Time: 4128-7867 OT Time Calculation (min): 41 min MFR 6720-9470 16' Reassess 1122-1139 16' Therex 9628-3662 9'  Visit#: 8 of 24  Re-eval: 08/30/13  Assessment Diagnosis: Left RCR  Authorization: Medicare  Authorization Time Period: Before 10th Visit  Authorization Visit#: 8 of 10   Past Medical History:  Past Medical History  Diagnosis Date  . Asthma   . Hyperlipidemia   . Hypertension   . Syncope and collapse 05/11/11  adrenal insuffiency  treated at [facility]  . Thyroid disease    Past Surgical History:  Past Surgical History  Procedure Laterality Date  . Cardiac catheterization  2012 at [facility]  . 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 Abdominal hysterectomy    . Cholecystectomy, laparoscopic    . Eye surg for glaucoma    . Right knee surg    . Cholecystectomy      Subjective Symptoms/Limitations Symptoms: S: My shoulder still feels good.  Pain Assessment Currently in Pain?: No/denies  Precautions/Restrictions  Precautions Precautions: Shoulder Type of Shoulder Precautions: See Scanned Protocol  Assessment Additional Assessments LUE AROM (degrees) LUE Overall AROM Comments: Assessed in supine and seated, with shoulder adducted for ER/IR  Left Shoulder Flexion:  (160/145 (on eval: 150)) Left Shoulder ABduction:  (146/150 (on eval: 180)) Left Shoulder Internal Rotation:  (90/90 (same on eval)) Left Shoulder External Rotation:  (86/74 (on eval: 80)) LUE Strength LUE Overall Strength:  (Strength was not tested on eval) Left Shoulder Flexion: 3+/5 Left Shoulder ABduction: 3+/5 Left Shoulder Internal Rotation:  (4-/5) Left Shoulder External Rotation:  (4-/5) Palpation Palpation: Min fascial restrictions in left upper arm, trapezius, and scapularis region      Exercise/Treatments Supine Protraction: PROM;5 reps Horizontal ABduction: PROM;5 reps External Rotation: PROM;5 reps Internal Rotation: PROM;5 reps Flexion: PROM;5 reps ABduction: PROM;5 reps Standing Protraction: AROM;10 reps Horizontal ABduction: AROM;10 reps External Rotation: AROM;10 reps Internal Rotation: AROM;10 reps Flexion: AROM;10 reps Therapy Ball Right/Left: 5 reps     Manual Therapy Manual Therapy: Myofascial release Myofascial Release: Myofascial release (MFR) to left upper arm, anterior shoulder, upper trap, and scapular regions to decrease fascial restrictions and promote imporved ROM. Scar massage to incision sites  Occupational Therapy Assessment and Plan OT Assessment and Plan Clinical Impression Statement: A: Reassessment completed this date. Patient has met 4/5 STGs and 2/6 LTGs. Patient is making great progress towards goals.  OT Plan: P: Increase reps with AROM supine.   Goals Short Term Goals Time to Complete Short Term Goals: 6 weeks Short Term Goal 1: Pt will be educated on HEP. Short Term Goal 1 Progress: Met Short Term Goal 2: Pt will attain PROM WNL for improved ability to fix her hair. Short Term Goal 2 Progress: Met Short Term Goal 3: Pt will achieve strength of atleast 4-/5 in left shoulder. Short Term Goal 3 Progress: Progressing toward goal Short Term Goal 4: Pt will decrease fascial restrictions to min-mod in left shoulder region Short Term Goal 4 Progress: Met Short Term Goal 5: Pt will have pain of less than 3/10 with end range movements Short Term Goal 5 Progress: Met Long Term Goals Time to Complete Long Term Goals: 12 weeks Long Term Goal 1: Pt will achieve prior level of functioning in all ADL and IADL tasks. Long Term Goal 1 Progress: Progressing toward goal Long Term  Goal 2: Pt wil achieve AROM WNL for improved ability to fix her hair. Long Term Goal 2 Progress: Progressing toward goal Long Term Goal 3: Pt will have strength  of 5/5 in left shoulder. Long Term Goal 3 Progress: Progressing toward goal Long Term Goal 4: Pt will decrease fascial restrictions in left shoulder to less than minimal for improved AROM. Long Term Goal 4 Progress: Progressing toward goal Long Term Goal 5: Pt will have overall left shoulder pain of less than 1/10. Long Term Goal 5 Progress: Met Long Term Goal 6: Pt will verbalize ability to don her bra in 5/5 trials. Long Term Goal 6 Progress: Met  Problem List Patient Active Problem List   Diagnosis Date Noted  . Pain in joint, shoulder region 08/02/2013  . Muscle weakness (generalized) 08/02/2013  . Decreased range of motion of left shoulder 08/02/2013  . Tight fascia 08/02/2013    End of Session Activity Tolerance: Patient tolerated treatment well General Behavior During Therapy: [facility] for tasks assessed/performed  GO Functional Assessment Tool Used: FOTO score: 57/100 ( on eval: 71/100) Functional Limitation: Carrying, moving and handling objects Carrying, Moving and Handling Objects Current Status ([id number]): At least 40 percent but less than 60 percent impaired, limited or restricted Carrying, Moving and Handling Objects Goal Status [phone]): At least 1 percent but less than 20 percent impaired, limited or restricted  Ailene Ravel, OTR/L,CBIS   08/29/2013, 12:01 PM  Physician Documentation Your signature is required to indicate approval of the treatment plan as stated above.  Please sign and either send electronically or make a copy of this report for your files and return this physician signed original.  Please mark one 1.__approve of plan  2. ___approve of plan with the following conditions.   ______________________________                                                          _____________________ Physician Signature                                                                                                             Date

## 2013-09-01 ENCOUNTER — Ambulatory Visit (HOSPITAL_COMMUNITY)
Admission: RE | Admit: 2013-09-01 | Discharge: 2013-09-01 | Disposition: A | Discharge status: Home / Self Care | Payer: Medicare Other | Source: Ambulatory Visit | Attending: Orthopedic Surgery | Admitting: Orthopedic Surgery

## 2013-09-01 DIAGNOSIS — IMO0001 Reserved for inherently not codable concepts without codable children: Secondary | ICD-10-CM | POA: Diagnosis not present

## 2013-09-01 NOTE — Progress Notes (Signed)
Occupational Therapy Treatment Patient Details  Name: Deborah Murray MRN: [id number] Date of Birth: [birth year]  Today's Date: 09/01/2013 Time: 1022-1102 OT Time Calculation (min): 40 min Manual 1022-1037 (15') Therapeutic Exercises 1037-1102 (25')  Visit#: 9 of 24  Re-eval: 09/27/13    Authorization: Medicare  Authorization Time Period: Before 18th visit  Authorization Visit#: 9 of 18  Subjective Symptoms/Limitations Symptoms: "I ain't got none (pain)" Pain Assessment Currently in Pain?: No/denies  Precautions/Restrictions     Exercise/Treatments Supine Protraction: PROM;5 reps;AROM;15 reps Horizontal ABduction: PROM;5 reps;AROM;15 reps External Rotation: PROM;5 reps;AROM;15 reps Internal Rotation: PROM;5 reps;AROM;15 reps Flexion: PROM;5 reps;AROM;15 reps ABduction: PROM;5 reps;AROM;15 reps Standing Protraction: AROM;10 reps Horizontal ABduction: AROM;10 reps External Rotation: AROM;10 reps Internal Rotation: AROM;10 reps Flexion: AROM;10 reps ABduction: AROM;10 reps Extension: Theraband;12 reps Theraband Level (Shoulder Extension): Level 2 (Red) Row: Theraband;12 reps Theraband Level (Shoulder Row): Level 2 (Red) ROM / Strengthening / Isometric Strengthening Thumb Tacks: 1' (2 rest breaks (one for hand fatigue, other for hsoulder fati) Proximal Shoulder Strengthening, Supine: 10x with 1 rest break   Manual Therapy Manual Therapy: Myofascial release Myofascial Release: Myofascial release (MFR) to left upper arm, anterior shoulder, upper trap, and scapular regions to decrease fascial restrictions and promote imporved ROM. Scar massage to incision sites.    Occupational Therapy Assessment and Plan OT Assessment and Plan Clinical Impression Statement: Increased supine AROM reps - pt fatigued, but toelrated well. Pt had good toelrance again for standing AROM, but requred some rest breaks.  Completed thumbtacks with increased difficulty this session. OT Plan:  Continue AROM supine and standing. Add to HEP.   Goals Short Term Goals Short Term Goal 1: Pt will be educated on HEP. Short Term Goal 1 Progress: Met Short Term Goal 2: Pt will attain PROM WNL for improved ability to fix her hair. Short Term Goal 2 Progress: Met Short Term Goal 3: Pt will achieve strength of atleast 4-/5 in left shoulder. Short Term Goal 3 Progress: Progressing toward goal Short Term Goal 4: Pt will decrease fascial restrictions to min-mod in left shoulder region Short Term Goal 4 Progress: Met Short Term Goal 5: Pt will have pain of less than 3/10 with end range movements Short Term Goal 5 Progress: Met Long Term Goals Long Term Goal 1: Pt will achieve prior level of functioning in all ADL and IADL tasks. Long Term Goal 1 Progress: Progressing toward goal Long Term Goal 2: Pt wil achieve AROM WNL for improved ability to fix her hair. Long Term Goal 2 Progress: Progressing toward goal Long Term Goal 3: Pt will have strength of 5/5 in left shoulder. Long Term Goal 3 Progress: Progressing toward goal Long Term Goal 4: Pt will decrease fascial restrictions in left shoulder to less than minimal for improved AROM. Long Term Goal 4 Progress: Progressing toward goal Long Term Goal 5: Pt will have overall left shoulder pain of less than 1/10. Long Term Goal 5 Progress: Met Long Term Goal 6: Pt will verbalize ability to don her bra in 5/5 trials. Long Term Goal 6 Progress: Met  Problem List Patient Active Problem List   Diagnosis Date Noted  . Pain in joint, shoulder region 08/02/2013  . Muscle weakness (generalized) 08/02/2013  . Decreased range of motion of left shoulder 08/02/2013  . Tight fascia 08/02/2013    End of Session Activity Tolerance: Patient tolerated treatment well General Behavior During Therapy: [facility] for tasks assessed/performed  GO    Bea Graff, [place], OTR/L [phone]  09/01/2013, 11:55 AM

## 2013-09-06 ENCOUNTER — Ambulatory Visit (HOSPITAL_COMMUNITY)
Admission: RE | Admit: 2013-09-06 | Discharge: 2013-09-06 | Disposition: A | Discharge status: Home / Self Care | Payer: Medicare Other | Source: Ambulatory Visit | Attending: Orthopedic Surgery | Admitting: Orthopedic Surgery

## 2013-09-06 DIAGNOSIS — M629 Disorder of muscle, unspecified: Secondary | ICD-10-CM

## 2013-09-06 DIAGNOSIS — IMO0001 Reserved for inherently not codable concepts without codable children: Secondary | ICD-10-CM | POA: Diagnosis not present

## 2013-09-06 DIAGNOSIS — M6281 Muscle weakness (generalized): Secondary | ICD-10-CM

## 2013-09-06 DIAGNOSIS — M6289 Other specified disorders of muscle: Secondary | ICD-10-CM

## 2013-09-06 DIAGNOSIS — M25519 Pain in unspecified shoulder: Secondary | ICD-10-CM

## 2013-09-06 NOTE — Progress Notes (Signed)
Occupational Therapy Treatment Patient Details  Name: Deborah Murray MRN: [id number] Date of Birth: 1960-05-19  Today's Date: 09/06/2013 Time: 4854-6270 OT Time Calculation (min): 38 min MFR 937-948 11' Therex 350-0938 27'  Visit#: 10 of 24  Re-eval: 09/27/13    Authorization: Medicare  Authorization Time Period: Before 18th visit  Authorization Visit#: 10 of 18  Subjective Symptoms/Limitations Symptoms: S: I didn't fall asleep until 3am this morning.  Pain Assessment Currently in Pain?: No/denies  Precautions/Restrictions  Precautions Precautions: Shoulder Type of Shoulder Precautions: See Scanned Protocol  Exercise/Treatments Supine Protraction: PROM;5 reps;AROM;15 reps Horizontal ABduction: PROM;5 reps;AROM;15 reps External Rotation: PROM;5 reps;AROM;15 reps Internal Rotation: PROM;5 reps;AROM;15 reps Flexion: PROM;5 reps;AROM;15 reps ABduction: PROM;5 reps;AROM;15 reps Standing Protraction: AROM;10 reps Horizontal ABduction: AROM;10 reps External Rotation: AROM;10 reps Internal Rotation: AROM;10 reps Flexion: AROM;10 reps ABduction: AROM;10 reps Extension: Theraband;12 reps Theraband Level (Shoulder Extension): Level 2 (Red) Row: Theraband;12 reps Theraband Level (Shoulder Row): Level 2 (Red) ROM / Strengthening / Isometric Strengthening Wall Wash: 1' X to V Arms: 10X Proximal Shoulder Strengthening, Supine: 15X       Manual Therapy Manual Therapy: Myofascial release Myofascial Release: Myofascial release (MFR) to left upper arm, anterior shoulder, upper trap, and scapular regions to decrease fascial restrictions and promote imporved ROM. Scar massage to incision sites.   Occupational Therapy Assessment and Plan OT Assessment and Plan Clinical Impression Statement: A: Added to HEP (AROM exercises and red theraband). pt completed all with good form. Added wall wash and X to V arms. Patient tolerated well.   OT Plan: A: Add ball on the wall    Goals Short Term Goals Short Term Goal 1: Pt will be educated on HEP. Short Term Goal 2: Pt will attain PROM WNL for improved ability to fix her hair. Short Term Goal 3: Pt will achieve strength of atleast 4-/5 in left shoulder. Short Term Goal 4: Pt will decrease fascial restrictions to min-mod in left shoulder region Short Term Goal 5: Pt will have pain of less than 3/10 with end range movements Long Term Goals Time to Complete Long Term Goals: 12 weeks Long Term Goal 1: Pt will achieve prior level of functioning in all ADL and IADL tasks. Long Term Goal 1 Progress: Progressing toward goal Long Term Goal 2: Pt wil achieve AROM WNL for improved ability to fix her hair. Long Term Goal 2 Progress: Progressing toward goal Long Term Goal 3: Pt will have strength of 5/5 in left shoulder. Long Term Goal 3 Progress: Progressing toward goal Long Term Goal 4: Pt will decrease fascial restrictions in left shoulder to less than minimal for improved AROM. Long Term Goal 4 Progress: Progressing toward goal Long Term Goal 5: Pt will have overall left shoulder pain of less than 1/10. Long Term Goal 6: Pt will verbalize ability to don her bra in 5/5 trials.  Problem List Patient Active Problem List   Diagnosis Date Noted  . Pain in joint, shoulder region 08/02/2013  . Muscle weakness (generalized) 08/02/2013  . Decreased range of motion of left shoulder 08/02/2013  . Tight fascia 08/02/2013    End of Session Activity Tolerance: Patient tolerated treatment well General Behavior During Therapy: [facility] for tasks assessed/performed OT Plan of Care OT Home Exercise Plan: AROM exercises and Red theraband exercises OT Patient Instructions: handout provided (scanned) Consulted and Agree with Plan of Care: Patient   Deborah Murray, OTR/L,CBIS   09/06/2013, 10:12 AM

## 2013-09-08 ENCOUNTER — Ambulatory Visit (HOSPITAL_COMMUNITY)
Admission: RE | Admit: 2013-09-08 | Discharge: 2013-09-08 | Disposition: A | Discharge status: Home / Self Care | Payer: Medicare Other | Source: Ambulatory Visit | Attending: Orthopedic Surgery | Admitting: Orthopedic Surgery

## 2013-09-08 DIAGNOSIS — IMO0001 Reserved for inherently not codable concepts without codable children: Secondary | ICD-10-CM | POA: Diagnosis not present

## 2013-09-08 NOTE — Progress Notes (Signed)
Occupational Therapy Treatment Patient Details  Name: Deborah Murray MRN: [id number] Date of Birth: 1961-04-13  Today's Date: 09/08/2013 Time: 2297-9892 OT Time Calculation (min): 40 min Manual 1194-1740 (16') Therapeutic Exercises 1122-1146 (24')  Visit#: 11 of 24  Re-eval: 09/27/13    Authorization: Medicare  Authorization Time Period: Before 18th visit  Authorization Visit#: 11 of 18  Subjective Symptoms/Limitations Symptoms: "it isn't hurting at all." Limitations: MD Spang Protocol: 4-6 weeks (4/20-5/4): gentle AAROM/AROM, joint mobs      6-8 weeks (5/4-5/18):  AROM, deltoied and biceps strengthening      8-12 weeks (5/18-6/15):  scapular strengthening, ER/IR isometrics, posterior capsule stretch      12 weeks-5 months: increase flexibility/volocity of motion Pain Assessment Currently in Pain?: No/denies  Precautions/Restrictions     Exercise/Treatments Supine Protraction: PROM;5 reps;AROM;15 reps Horizontal ABduction: PROM;5 reps;AROM;15 reps External Rotation: PROM;5 reps;AROM;15 reps Internal Rotation: PROM;5 reps;AROM;15 reps Flexion: PROM;5 reps;AROM;15 reps ABduction: PROM;5 reps;AROM;15 reps Seated Elevation: AROM;15 reps Standing Protraction: AROM;12 reps Horizontal ABduction: AROM;12 reps External Rotation: AROM;12 reps Internal Rotation: AROM;12 reps Flexion: AROM;12 reps ABduction: AROM;12 reps Extension: Theraband;15 reps Theraband Level (Shoulder Extension): Level 2 (Red) Row: Theraband;15 reps Theraband Level (Shoulder Row): Level 2 (Red) ROM / Strengthening / Isometric Strengthening X to V Arms: 10X Proximal Shoulder Strengthening, Supine: 15X Proximal Shoulder Strengthening, Seated: 10x each, in standing Ball on Wall: 45" in each flexion and abduction. Pt with slight hesitation/rest breaks during abduction, but abel to rech 45" goal.    Manual Therapy Manual Therapy: Myofascial release Myofascial Release: Myofascial release (MFR) to left  upper arm, anterior shoulder, upper trap, and scapular regions to decrease fascial restrictions and promote imporved ROM. Scar massage to incision sites.    Occupational Therapy Assessment and Plan OT Assessment and Plan Clinical Impression Statement: Added ball on wall this session. Pt fatigued, but able to accomplish 45" goal.  Pt had good tolerance of x-v arms and slight increase in standing AROM reps. OT Plan: Follow up on HEP (AROm and theraband).  Continue wall wash.  Attempt 1# in supine with few reps.   Goals Short Term Goals Short Term Goal 1: Pt will be educated on HEP. Short Term Goal 1 Progress: Met Short Term Goal 2: Pt will attain PROM WNL for improved ability to fix her hair. Short Term Goal 2 Progress: Met Short Term Goal 3: Pt will achieve strength of atleast 4-/5 in left shoulder. Short Term Goal 3 Progress: Progressing toward goal Short Term Goal 4: Pt will decrease fascial restrictions to min-mod in left shoulder region Short Term Goal 4 Progress: Met Short Term Goal 5: Pt will have pain of less than 3/10 with end range movements Short Term Goal 5 Progress: Met Long Term Goals Long Term Goal 1: Pt will achieve prior level of functioning in all ADL and IADL tasks. Long Term Goal 1 Progress: Progressing toward goal Long Term Goal 2: Pt wil achieve AROM WNL for improved ability to fix her hair. Long Term Goal 2 Progress: Progressing toward goal Long Term Goal 3: Pt will have strength of 5/5 in left shoulder. Long Term Goal 3 Progress: Progressing toward goal Long Term Goal 4: Pt will decrease fascial restrictions in left shoulder to less than minimal for improved AROM. Long Term Goal 4 Progress: Progressing toward goal Long Term Goal 5: Pt will have overall left shoulder pain of less than 1/10. Long Term Goal 5 Progress: Met Long Term Goal 6: Pt will verbalize ability to don  her bra in 5/5 trials. Long Term Goal 6 Progress: Met  Problem List Patient Active Problem  List   Diagnosis Date Noted  . Pain in joint, shoulder region 08/02/2013  . Muscle weakness (generalized) 08/02/2013  . Decreased range of motion of left shoulder 08/02/2013  . Tight fascia 08/02/2013    End of Session Activity Tolerance: Patient tolerated treatment well General Behavior During Therapy: [facility] for tasks assessed/performed  GO   Bea Graff, MS, OTR/L [phone]  09/08/2013, 11:49 AM

## 2013-09-12 ENCOUNTER — Ambulatory Visit (HOSPITAL_COMMUNITY)
Admission: RE | Admit: 2013-09-12 | Discharge: 2013-09-12 | Disposition: A | Discharge status: Home / Self Care | Payer: Medicare Other | Source: Ambulatory Visit | Attending: Orthopedic Surgery | Admitting: Orthopedic Surgery

## 2013-09-12 DIAGNOSIS — M25619 Stiffness of unspecified shoulder, not elsewhere classified: Secondary | ICD-10-CM | POA: Diagnosis not present

## 2013-09-12 DIAGNOSIS — M6281 Muscle weakness (generalized): Secondary | ICD-10-CM

## 2013-09-12 DIAGNOSIS — M25519 Pain in unspecified shoulder: Secondary | ICD-10-CM | POA: Diagnosis not present

## 2013-09-12 DIAGNOSIS — M629 Disorder of muscle, unspecified: Secondary | ICD-10-CM

## 2013-09-12 DIAGNOSIS — M6289 Other specified disorders of muscle: Secondary | ICD-10-CM

## 2013-09-12 DIAGNOSIS — IMO0001 Reserved for inherently not codable concepts without codable children: Secondary | ICD-10-CM | POA: Diagnosis not present

## 2013-09-12 NOTE — Progress Notes (Signed)
Occupational Therapy Treatment Patient Details  Name: Deborah Murray MRN: [id number] Date of Birth: February 23, 1961  Today's Date: 09/12/2013 Time: 2595-6387 OT Time Calculation (min): 38 min MFR 935-945 10' Therex [phone] 28'  Visit#: 12 of 24  Re-eval: 09/27/13    Authorization: Medicare  Authorization Time Period: Before 18th visit  Authorization Visit#: 12 of 18  Subjective Symptoms/Limitations Symptoms: S: When the weather is bad my shoulder gets sore at night.  Pain Assessment Currently in Pain?: No/denies  Precautions/Restrictions  Precautions Precautions: Shoulder Type of Shoulder Precautions: See Scanned Protocol  Exercise/Treatments Supine Protraction: PROM;5 reps;Strengthening;Weights;10 reps Protraction Weight (lbs): 1 Horizontal ABduction: PROM;5 reps;Strengthening;Weights;10 reps Horizontal ABduction Weight (lbs): 1 External Rotation: PROM;5 reps;Strengthening;Weights;10 reps External Rotation Weight (lbs): 1 Internal Rotation: PROM;5 reps;Strengthening;Weights;10 reps Internal Rotation Weight (lbs): 1 Flexion: PROM;5 reps;Strengthening;Weights;10 reps Shoulder Flexion Weight (lbs): 1 ABduction: PROM;5 reps;Strengthening;Weights;10 reps Shoulder ABduction Weight (lbs): 1 Standing Protraction: Strengthening;Weights;10 reps Protraction Weight (lbs): 1 Horizontal ABduction: Strengthening;Weights;10 reps Horizontal ABduction Weight (lbs): 1 External Rotation: Strengthening;Weights;10 reps External Rotation Weight (lbs): 1 Internal Rotation: Strengthening;Weights;10 reps Internal Rotation Weight (lbs): 1 Flexion: Strengthening;Weights;10 reps Shoulder Flexion Weight (lbs): 1 ABduction: Strengthening;Weights;10 reps Shoulder ABduction Weight (lbs): 1 Extension: Theraband;15 reps Theraband Level (Shoulder Extension): Level 2 (Red) Row: Theraband;15 reps Theraband Level (Shoulder Row): Level 2 (Red) ROM / Strengthening / Isometric Strengthening UBE  (Upper Arm Bike): 3.0 2' forward/2' reverse Wall Wash: 1' X to V Arms: 10X Proximal Shoulder Strengthening, Supine: 12X with 1# Proximal Shoulder Strengthening, Seated: 10x each, in standing Ball on Wall: 1' flexion 1' abduction green      Manual Therapy Manual Therapy: Myofascial release Myofascial Release: Myofascial release (MFR) to left upper arm, anterior shoulder, upper trap, and scapular regions to decrease fascial restrictions and promote imporved ROM. Scar massage to incision sites.  Occupational Therapy Assessment and Plan OT Assessment and Plan Clinical Impression Statement: A: Added UBE bike and 1# weight supine and standing. Pt tolerated well.  OT Plan: P: Increase time  with wall wash   Goals Short Term Goals Time to Complete Short Term Goals: 6 weeks Short Term Goal 1: Pt will be educated on HEP. Short Term Goal 2: Pt will attain PROM WNL for improved ability to fix her hair. Short Term Goal 3: Pt will achieve strength of atleast 4-/5 in left shoulder. Short Term Goal 3 Progress: Progressing toward goal Short Term Goal 4: Pt will decrease fascial restrictions to min-mod in left shoulder region Short Term Goal 5: Pt will have pain of less than 3/10 with end range movements Long Term Goals Time to Complete Long Term Goals: 12 weeks Long Term Goal 1: Pt will achieve prior level of functioning in all ADL and IADL tasks. Long Term Goal 1 Progress: Progressing toward goal Long Term Goal 2: Pt wil achieve AROM WNL for improved ability to fix her hair. Long Term Goal 2 Progress: Progressing toward goal Long Term Goal 3: Pt will have strength of 5/5 in left shoulder. Long Term Goal 3 Progress: Progressing toward goal Long Term Goal 4: Pt will decrease fascial restrictions in left shoulder to less than minimal for improved AROM. Long Term Goal 4 Progress: Progressing toward goal Long Term Goal 5: Pt will have overall left shoulder pain of less than 1/10. Long Term Goal 6: Pt  will verbalize ability to don her bra in 5/5 trials.  Problem List Patient Active Problem List   Diagnosis Date Noted  . Pain in joint, shoulder region 08/02/2013  .  Muscle weakness (generalized) 08/02/2013  . Decreased range of motion of left shoulder 08/02/2013  . Tight fascia 08/02/2013    End of Session Activity Tolerance: Patient tolerated treatment well General Behavior During Therapy: [facility] for tasks assessed/performed   Ailene Ravel, OTR/L,CBIS   09/12/2013, 10:12 AM

## 2013-09-14 ENCOUNTER — Ambulatory Visit (HOSPITAL_COMMUNITY)
Admission: RE | Admit: 2013-09-14 | Discharge: 2013-09-14 | Disposition: A | Discharge status: Home / Self Care | Payer: Medicare Other | Source: Ambulatory Visit | Attending: Orthopedic Surgery | Admitting: Orthopedic Surgery

## 2013-09-14 DIAGNOSIS — IMO0001 Reserved for inherently not codable concepts without codable children: Secondary | ICD-10-CM | POA: Diagnosis not present

## 2013-09-14 NOTE — Progress Notes (Signed)
Occupational Therapy Treatment Patient Details  Name: Deborah Murray MRN: [id number] Date of Birth: 04-Nov-1960  Today's Date: 09/14/2013 Time: 7341-9379 OT Time Calculation (min): 43 min MFR 932-940 9' Therex [phone] 34'  Visit#: 13 of 24  Re-eval: 09/27/13    Authorization: Medicare  Authorization Time Period: Before 18th visit  Authorization Visit#: 13 of 18  Subjective  S: My shoulder is feeling good today.  Precautions/Restrictions   Shoulder. Scanned protocol  Exercise/Treatments Supine Protraction: PROM;5 reps;Strengthening;12 reps Protraction Weight (lbs): 1 Horizontal ABduction: PROM;5 reps;Strengthening;12 reps Horizontal ABduction Weight (lbs): 1 External Rotation: PROM;5 reps;Strengthening;12 reps External Rotation Weight (lbs): 1 Internal Rotation: PROM;5 reps;Strengthening;12 reps Internal Rotation Weight (lbs): 1 Flexion: PROM;5 reps;Strengthening;12 reps Shoulder Flexion Weight (lbs): 1 ABduction: PROM;5 reps;Strengthening;12 reps Shoulder ABduction Weight (lbs): 1 Standing Protraction: Strengthening;Weights;10 reps Protraction Weight (lbs): 1 Horizontal ABduction: Strengthening;Weights;10 reps Horizontal ABduction Weight (lbs): 1 External Rotation: Strengthening;Weights;10 reps External Rotation Weight (lbs): 1 Internal Rotation: Strengthening;Weights;10 reps Internal Rotation Weight (lbs): 1 Flexion: Strengthening;Weights;10 reps Shoulder Flexion Weight (lbs): 1 ABduction: Strengthening;Weights;10 reps Shoulder ABduction Weight (lbs): 1 ROM / Strengthening / Isometric Strengthening UBE (Upper Arm Bike): 3.0 3' forward/3' reverse Wall Wash: 2' X to V Arms: 10X with 1# Proximal Shoulder Strengthening, Supine: 12X with 1# Proximal Shoulder Strengthening, Seated: 10x each, in standing      Manual Therapy Manual Therapy: Myofascial release Myofascial Release: Myofascial release to left upper arm, trapezius, and scapularis region to decrease  fascial restrictions and increase joint mobility in a pain free zone.   Occupational Therapy Assessment and Plan OT Assessment and Plan Clinical Impression Statement: A: Increased time with wall wash and added proximal shoulder strengthening. patient unable to complete proximal shoulder strengthening with weight this time.  OT Plan: P: Work on increasing shoulder strability to be able to complete proximal shoulder strengthening standing with weight.    Goals Short Term Goals Time to Complete Short Term Goals: 6 weeks Short Term Goal 1: Pt will be educated on HEP. Short Term Goal 2: Pt will attain PROM WNL for improved ability to fix her hair. Short Term Goal 3: Pt will achieve strength of atleast 4-/5 in left shoulder. Short Term Goal 4: Pt will decrease fascial restrictions to min-mod in left shoulder region Short Term Goal 5: Pt will have pain of less than 3/10 with end range movements Long Term Goals Time to Complete Long Term Goals: 12 weeks Long Term Goal 1: Pt will achieve prior level of functioning in all ADL and IADL tasks. Long Term Goal 2: Pt wil achieve AROM WNL for improved ability to fix her hair. Long Term Goal 3: Pt will have strength of 5/5 in left shoulder. Long Term Goal 4: Pt will decrease fascial restrictions in left shoulder to less than minimal for improved AROM. Long Term Goal 5: Pt will have overall left shoulder pain of less than 1/10. Long Term Goal 6: Pt will verbalize ability to don her bra in 5/5 trials.  Problem List Patient Active Problem List   Diagnosis Date Noted  . Pain in joint, shoulder region 08/02/2013  . Muscle weakness (generalized) 08/02/2013  . Decreased range of motion of left shoulder 08/02/2013  . Tight fascia 08/02/2013    End of Session Activity Tolerance: Patient tolerated treatment well General Behavior During Therapy: [facility] for tasks assessed/performed     Ailene Ravel, OTR/L,CBIS   09/14/2013, 10:11 AM

## 2013-09-19 ENCOUNTER — Ambulatory Visit (HOSPITAL_COMMUNITY)
Admission: RE | Admit: 2013-09-19 | Discharge: 2013-09-19 | Disposition: A | Discharge status: Home / Self Care | Payer: Medicare Other | Source: Ambulatory Visit | Attending: Orthopedic Surgery | Admitting: Orthopedic Surgery

## 2013-09-19 DIAGNOSIS — M25519 Pain in unspecified shoulder: Secondary | ICD-10-CM

## 2013-09-19 DIAGNOSIS — M6289 Other specified disorders of muscle: Secondary | ICD-10-CM

## 2013-09-19 DIAGNOSIS — M629 Disorder of muscle, unspecified: Secondary | ICD-10-CM

## 2013-09-19 DIAGNOSIS — IMO0001 Reserved for inherently not codable concepts without codable children: Secondary | ICD-10-CM | POA: Diagnosis not present

## 2013-09-19 DIAGNOSIS — M6281 Muscle weakness (generalized): Secondary | ICD-10-CM

## 2013-09-19 NOTE — Progress Notes (Signed)
Occupational Therapy Treatment Patient Details  Name: JACKELIN CORREIA MRN: [id number] Date of Birth: 07-Jun-1960  Today's Date: 09/19/2013 Time: 4970-2637 OT Time Calculation (min): 40 min MFR 935-948 13' Therex [phone]   Visit#: 14 of 24  Re-eval: 09/27/13    Authorization: Medicare  Authorization Time Period: Before 18th visit  Authorization Visit#: 14 of 18  Subjective Symptoms/Limitations Symptoms: S: I was sick this weekend with a cold.  Pain Assessment Currently in Pain?: No/denies  Precautions/Restrictions  Precautions Precautions: Shoulder Type of Shoulder Precautions: See Scanned Protocol  Exercise/Treatments Supine Protraction: PROM;5 reps;Strengthening;12 reps Protraction Weight (lbs): 1 Horizontal ABduction: PROM;5 reps;Strengthening;12 reps Horizontal ABduction Weight (lbs): 1 External Rotation: PROM;5 reps;Strengthening;12 reps External Rotation Weight (lbs): 1 Internal Rotation: PROM;5 reps;Strengthening;12 reps Internal Rotation Weight (lbs): 1 Flexion: PROM;5 reps;Strengthening;12 reps Shoulder Flexion Weight (lbs): 1 ABduction: PROM;5 reps;Strengthening;12 reps Shoulder ABduction Weight (lbs): 1 Standing Protraction: Strengthening;Weights;10 reps Protraction Weight (lbs): 1 Horizontal ABduction: Strengthening;Weights;10 reps Horizontal ABduction Weight (lbs): 1 External Rotation: Strengthening;Weights;10 reps External Rotation Weight (lbs): 1 Internal Rotation: Strengthening;Weights;10 reps Internal Rotation Weight (lbs): 1 Flexion: Strengthening;Weights;10 reps Shoulder Flexion Weight (lbs): 1 ABduction: Strengthening;Weights;10 reps Shoulder ABduction Weight (lbs): 1 ROM / Strengthening / Isometric Strengthening UBE (Upper Arm Bike): 1.0 1' forward/ 1' reverse Wall Wash: 2' X to V Arms: 10X with 1# Proximal Shoulder Strengthening, Supine: 12X with 1# Proximal Shoulder Strengthening, Seated: 10X with 1# (rest break after each  movement) Ball on Wall: 1' flexion 1' abduction green      Manual Therapy Manual Therapy: Myofascial release Myofascial Release: Myofascial release to left upper arm, trapezius, and scapularis region to decrease fascial restrictions and increase joint mobility in a pain free zone.  Occupational Therapy Assessment and Plan OT Assessment and Plan Clinical Impression Statement: A: Pt able to complete proximal shoulder strengthening exercise standing with 1# this date. Pt did require increased rest breaks during task. OT Plan: P: Cont. to work on completing all movement of standing proximal shoulder with 1# without rest breaks.    Goals Short Term Goals Time to Complete Short Term Goals: 6 weeks Short Term Goal 1: Pt will be educated on HEP. Short Term Goal 2: Pt will attain PROM WNL for improved ability to fix her hair. Short Term Goal 3: Pt will achieve strength of atleast 4-/5 in left shoulder. Short Term Goal 3 Progress: Progressing toward goal Short Term Goal 4: Pt will decrease fascial restrictions to min-mod in left shoulder region Short Term Goal 5: Pt will have pain of less than 3/10 with end range movements Long Term Goals Time to Complete Long Term Goals: 12 weeks Long Term Goal 1: Pt will achieve prior level of functioning in all ADL and IADL tasks. Long Term Goal 1 Progress: Progressing toward goal Long Term Goal 2: Pt wil achieve AROM WNL for improved ability to fix her hair. Long Term Goal 2 Progress: Progressing toward goal Long Term Goal 3: Pt will have strength of 5/5 in left shoulder. Long Term Goal 3 Progress: Progressing toward goal Long Term Goal 4: Pt will decrease fascial restrictions in left shoulder to less than minimal for improved AROM. Long Term Goal 4 Progress: Progressing toward goal Long Term Goal 5: Pt will have overall left shoulder pain of less than 1/10. Long Term Goal 6: Pt will verbalize ability to don her bra in 5/5 trials.  Problem List Patient  Active Problem List   Diagnosis Date Noted  . Pain in joint, shoulder region 08/02/2013  .  Muscle weakness (generalized) 08/02/2013  . Decreased range of motion of left shoulder 08/02/2013  . Tight fascia 08/02/2013    End of Session Activity Tolerance: Patient tolerated treatment well General Behavior During Therapy: [facility] for tasks assessed/performed   Ailene Ravel, OTR/L,CBIS   09/19/2013, 10:13 AM

## 2013-09-21 ENCOUNTER — Ambulatory Visit (HOSPITAL_COMMUNITY)
Admission: RE | Admit: 2013-09-21 | Discharge: 2013-09-21 | Disposition: A | Discharge status: Home / Self Care | Payer: Medicare Other | Source: Ambulatory Visit | Attending: Orthopedic Surgery | Admitting: Orthopedic Surgery

## 2013-09-21 DIAGNOSIS — M25519 Pain in unspecified shoulder: Secondary | ICD-10-CM

## 2013-09-21 DIAGNOSIS — M6289 Other specified disorders of muscle: Secondary | ICD-10-CM

## 2013-09-21 DIAGNOSIS — M629 Disorder of muscle, unspecified: Secondary | ICD-10-CM

## 2013-09-21 DIAGNOSIS — M6281 Muscle weakness (generalized): Secondary | ICD-10-CM

## 2013-09-21 DIAGNOSIS — IMO0001 Reserved for inherently not codable concepts without codable children: Secondary | ICD-10-CM | POA: Diagnosis not present

## 2013-09-21 NOTE — Progress Notes (Signed)
Occupational Therapy Treatment Patient Details  Name: Deborah Murray MRN: [id number] Date of Birth: 1961/03/06  Today's Date: 09/21/2013 Time: 8341-9622 OT Time Calculation (min): 38 min MFR 937-951 14' Therex [phone]  24'  Visit#: 15 of 24  Re-eval: 09/27/13    Authorization: Medicare  Authorization Time Period: Before 18th visit  Authorization Visit#: 15 of 18  Subjective Symptoms/Limitations Symptoms: S: My shoulder feels good today.  Pain Assessment Currently in Pain?: No/denies  Precautions/Restrictions  Precautions Precautions: Shoulder Type of Shoulder Precautions: See Scanned Protocol  Exercise/Treatments Supine Protraction: PROM;5 reps;Strengthening;12 reps Protraction Weight (lbs): 1 Horizontal ABduction: PROM;5 reps;Strengthening;12 reps Horizontal ABduction Weight (lbs): 1 External Rotation: PROM;5 reps;Strengthening;12 reps External Rotation Weight (lbs): 1 Internal Rotation: PROM;5 reps;Strengthening;12 reps Internal Rotation Weight (lbs): 1 Flexion: PROM;5 reps;Strengthening;12 reps Shoulder Flexion Weight (lbs): 1 ABduction: PROM;5 reps;Strengthening;12 reps Shoulder ABduction Weight (lbs): 1 Standing Protraction: Strengthening;12 reps Protraction Weight (lbs): 1 Horizontal ABduction: Strengthening;12 reps Horizontal ABduction Weight (lbs): 1 External Rotation: Strengthening;12 reps External Rotation Weight (lbs): 1 Internal Rotation: Strengthening;12 reps Internal Rotation Weight (lbs): 1 Flexion: Strengthening;12 reps Shoulder Flexion Weight (lbs): 1 ABduction: AROM;12 reps ROM / Strengthening / Isometric Strengthening UBE (Upper Arm Bike): level 1 3' forward/3' reverse Wall Wash: 1' with 1# Proximal Shoulder Strengthening, Supine: 12X with 1# Proximal Shoulder Strengthening, Seated: 12X no weight this date session Ball on Wall: 1' flexion 1' abduction green      Manual Therapy Manual Therapy: Myofascial release Myofascial Release:  Myofascial release to left upper arm, trapezius, and scapularis region to decrease fascial restrictions and increase joint mobility in a pain free zone.  Occupational Therapy Assessment and Plan OT Assessment and Plan Clinical Impression Statement: A: increased time on UBE bike this date. patient tolerated well. Patient had some shoulder pain during standing portion of AROM and refrained from using 1# for Abduction and proximal shoulder strengthening.  OT Plan: P: Add Cybex row/press. Cont to work on increasing shoulder and scapular stability.    Goals Short Term Goals Time to Complete Short Term Goals: 6 weeks Short Term Goal 1: Pt will be educated on HEP. Short Term Goal 2: Pt will attain PROM WNL for improved ability to fix her hair. Short Term Goal 3: Pt will achieve strength of atleast 4-/5 in left shoulder. Short Term Goal 3 Progress: Progressing toward goal Short Term Goal 4: Pt will decrease fascial restrictions to min-mod in left shoulder region Short Term Goal 5: Pt will have pain of less than 3/10 with end range movements Long Term Goals Time to Complete Long Term Goals: 12 weeks Long Term Goal 1: Pt will achieve prior level of functioning in all ADL and IADL tasks. Long Term Goal 1 Progress: Progressing toward goal Long Term Goal 2: Pt wil achieve AROM WNL for improved ability to fix her hair. Long Term Goal 2 Progress: Progressing toward goal Long Term Goal 3: Pt will have strength of 5/5 in left shoulder. Long Term Goal 3 Progress: Progressing toward goal Long Term Goal 4: Pt will decrease fascial restrictions in left shoulder to less than minimal for improved AROM. Long Term Goal 4 Progress: Progressing toward goal Long Term Goal 5: Pt will have overall left shoulder pain of less than 1/10. Long Term Goal 6: Pt will verbalize ability to don her bra in 5/5 trials.  Problem List Patient Active Problem List   Diagnosis Date Noted  . Pain in joint, shoulder region  08/02/2013  . Muscle weakness (generalized) 08/02/2013  .  Decreased range of motion of left shoulder 08/02/2013  . Tight fascia 08/02/2013    End of Session Activity Tolerance: Patient tolerated treatment well General Behavior During Therapy: [facility] for tasks assessed/performed   Ailene Ravel, OTR/L,CBIS   09/21/2013, 10:10 AM

## 2013-09-26 ENCOUNTER — Ambulatory Visit (HOSPITAL_COMMUNITY)
Admission: RE | Admit: 2013-09-26 | Discharge: 2013-09-26 | Disposition: A | Discharge status: Home / Self Care | Payer: Medicare Other | Source: Ambulatory Visit | Attending: Orthopedic Surgery | Admitting: Orthopedic Surgery

## 2013-09-26 DIAGNOSIS — IMO0001 Reserved for inherently not codable concepts without codable children: Secondary | ICD-10-CM | POA: Diagnosis not present

## 2013-09-26 NOTE — Evaluation (Signed)
Occupational Therapy Re-Evaluation  Patient Details  Name: Deborah Murray MRN: [id number] Date of Birth: May 07, 1960  Today's Date: 09/26/2013 Time: 1020-1103 OT Time Calculation (min): 43 min Manual 1020-1030 (10') MMT 1030-1045 (15') Therapeutic Exercises 1045-1103 (18')  Visit#: 16 of 24  Re-eval: 10/24/13  Assessment Diagnosis: Left RCR  Authorization: Medicare  Authorization Time Period: Before 26th visit  Authorization Visit#: 81 of 29   Past Medical History:  Past Medical History  Diagnosis Date  . Asthma   . Hyperlipidemia   . Hypertension   . Syncope and collapse 05/11/11  adrenal insuffiency  treated at [facility]  . Thyroid disease    Past Surgical History:  Past Surgical History  Procedure Laterality Date  . Cardiac catheterization  2012 at [facility]  . 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 Abdominal hysterectomy    . Cholecystectomy, laparoscopic    . Eye surg for glaucoma    . Right knee surg    . Cholecystectomy      Subjective Symptoms/Limitations Symptoms: "The pain has decreased. its not hurting as bad as it was." Limitations: MD Spang Protocol: 4-6 weeks (4/20-5/4): gentle AAROM/AROM, joint mobs      6-8 weeks (5/4-5/18):  AROM, deltoied and biceps strengthening      8-12 weeks (5/18-6/15):  scapular strengthening, ER/IR isometrics, posterior capsule stretch      12 weeks-5 months: increase flexibility/volocity of motion Special Tests: FOTO 67/100.  (Prevous 57/100 and eval 71/100) Pain Assessment Currently in Pain?: No/denies  Precautions/Restrictions  Precautions Precautions: Shoulder Type of Shoulder Precautions: See Scanned Protocol  Assessment ADL/Vision/Perception ADL ADL Comments: pt has improved to being able to complete ADL tasks at shoulder height and above.  Pt reports improved abilities to fix her hair, ease in getting dressed, improved abilities in donning a bra, ease with toilet hygeine, and much ease with getting  comfortable in order to sleep.  Additional Assessments LUE AROM (degrees) LUE Overall AROM Comments: Assessed in supine and seated, with shoulder adducted for ER/IR (previous measurements 5/19) Left Shoulder Flexion:  (165/161 (160/145)) Left Shoulder ABduction:  (179/157   (146/150)) Left Shoulder Internal Rotation:  (102/90    (90/90)) Left Shoulder External Rotation:  (74/84    (86/74)) LUE Strength LUE Overall Strength:  (Strenght assess in sitting) Left Shoulder Flexion: 4/5 (3+/5) Left Shoulder ABduction: 4/5 (3+/5) Left Shoulder Internal Rotation: 4/5 (4-/5) Left Shoulder External Rotation: 4/5 (4-/5) Palpation Palpation: pt remains with min fascial restrictions in left upper arm, trapezius, and scapularis region     Exercise/Treatments Supine Protraction: PROM;5 reps;Strengthening;12 reps Protraction Weight (lbs): 1 Horizontal ABduction: PROM;5 reps;Strengthening;12 reps Horizontal ABduction Weight (lbs): 1 External Rotation: PROM;5 reps;Strengthening;12 reps External Rotation Weight (lbs): 1 Internal Rotation: PROM;5 reps;Strengthening;12 reps Internal Rotation Weight (lbs): 1 Flexion: PROM;5 reps;Strengthening;12 reps Shoulder Flexion Weight (lbs): 1 ABduction: PROM;5 reps;Strengthening;12 reps Shoulder ABduction Weight (lbs): 1 Standing Protraction: Strengthening;12 reps Protraction Weight (lbs): 1 Horizontal ABduction: Strengthening;10 reps (Difficulty this session - 2 rest breaks with fewer reps) Horizontal ABduction Weight (lbs): 1 External Rotation: Strengthening;12 reps External Rotation Weight (lbs): 1 Internal Rotation: Strengthening;12 reps Internal Rotation Weight (lbs): 1 Flexion: Strengthening;12 reps Shoulder Flexion Weight (lbs): 1 ABduction: Strengthening;12 reps Shoulder ABduction Weight (lbs): 1 ROM / Strengthening / Isometric Strengthening Cybex Press: 2 plate;10 reps Cybex Row: 2 plate;10 reps   Manual Therapy Manual Therapy: Myofascial  release Myofascial Release: Myofascial release to left upper arm, trapezius, and scapularis region to decrease fascial  restrictions and increase joint mobility in a pain free zone.    Occupational Therapy Assessment and Plan OT Assessment and Plan Clinical Impression Statement: Re-Evaluation completed this session.  Pt has progressed well in therapy, meeting 5/5 STG and 4/6 LTG, with good progress towards remaining 2/6 strenght-related goals.  Pt will continued to benefit from skilled OT services for increasing LUE strength and functional use.  Added cybex row/press this session, with good tolerance.  pt ahd difficutly with 1# seated horizontal abudction this session, requring 2 rest breaks for 10 reps. Rehab Potential: Excellent OT Frequency: Min 2X/week OT Duration: 4 weeks OT Treatment/Interventions: Self-care/ADL training;Therapeutic exercise;Manual therapy;Modalities;Therapeutic activities;Patient/family education OT Plan: Continue to work on increasing shoulder and scapular stability.  Add functional reaching activity. Re-attempt horizontal abduction.   Goals Short Term Goals Short Term Goal 1: Pt will be educated on HEP. Short Term Goal 1 Progress: Met Short Term Goal 2: Pt will attain PROM WNL for improved ability to fix her hair. Short Term Goal 2 Progress: Met Short Term Goal 3: Pt will achieve strength of atleast 4-/5 in left shoulder. Short Term Goal 3 Progress: Met Short Term Goal 4: Pt will decrease fascial restrictions to min-mod in left shoulder region Short Term Goal 4 Progress: Met Short Term Goal 5: Pt will have pain of less than 3/10 with end range movements Short Term Goal 5 Progress: Met Long Term Goals Long Term Goal 1: Pt will achieve prior level of functioning in all ADL and IADL tasks. Long Term Goal 1 Progress: Progressing toward goal Long Term Goal 2: Pt wil achieve AROM WNL for improved ability to fix her hair. Long Term Goal 2 Progress: Met Long Term Goal 3:  Pt will have strength of 5/5 in left shoulder. Long Term Goal 3 Progress: Progressing toward goal Long Term Goal 4: Pt will decrease fascial restrictions in left shoulder to less than minimal for improved AROM. Long Term Goal 4 Progress: Met Long Term Goal 5: Pt will have overall left shoulder pain of less than 1/10. Long Term Goal 5 Progress: Met Long Term Goal 6: Pt will verbalize ability to don her bra in 5/5 trials. Long Term Goal 6 Progress: Met  Problem List Patient Active Problem List   Diagnosis Date Noted  . Pain in joint, shoulder region 08/02/2013  . Muscle weakness (generalized) 08/02/2013  . Decreased range of motion of left shoulder 08/02/2013  . Tight fascia 08/02/2013    End of Session Activity Tolerance: Patient tolerated treatment well General Behavior During Therapy: [facility] for tasks assessed/performed  GO Functional Assessment Tool Used: FOTO 67/100 (Previous 57/00 and on eval 71/100) Functional Limitation: Carrying, moving and handling objects Carrying, Moving and Handling Objects Current Status ([id number]): At least 20 percent but less than 40 percent impaired, limited or restricted Carrying, Moving and Handling Objects Goal Status [phone]): At least 1 percent but less than 20 percent impaired, limited or restricted   Bea Graff, [place], OTR/L [phone]  09/26/2013, 11:17 AM  Physician Documentation Your signature is required to indicate approval of the treatment plan as stated above.  Please sign and either send electronically or make a copy of this report for your files and return this physician signed original.  Please mark one 1.__approve of plan  2. ___approve of plan with the following conditions.   ______________________________  _____________________ Physician Signature                                                                                                             Date

## 2013-09-28 ENCOUNTER — Ambulatory Visit (HOSPITAL_COMMUNITY)
Admission: RE | Admit: 2013-09-28 | Discharge: 2013-09-28 | Disposition: A | Discharge status: Home / Self Care | Payer: Medicare Other | Source: Ambulatory Visit | Attending: Orthopedic Surgery | Admitting: Orthopedic Surgery

## 2013-09-28 DIAGNOSIS — IMO0001 Reserved for inherently not codable concepts without codable children: Secondary | ICD-10-CM | POA: Diagnosis not present

## 2013-09-28 DIAGNOSIS — M25519 Pain in unspecified shoulder: Secondary | ICD-10-CM

## 2013-09-28 DIAGNOSIS — M6281 Muscle weakness (generalized): Secondary | ICD-10-CM

## 2013-09-28 DIAGNOSIS — M6289 Other specified disorders of muscle: Secondary | ICD-10-CM

## 2013-09-28 DIAGNOSIS — M629 Disorder of muscle, unspecified: Secondary | ICD-10-CM

## 2013-09-28 NOTE — Progress Notes (Signed)
Occupational Therapy Treatment Patient Details  Name: Deborah Murray MRN: [id number] Date of Birth: 20-Jul-1960  Today's Date: 09/28/2013 Time: 2297-9892 OT Time Calculation (min): 45 min MFR 930-940 10' Therex [phone] 35'  Visit#: 17 of 24  Re-eval: 10/24/13    Authorization: Medicare  Authorization Time Period: Before 26th visit  Authorization Visit#: 17 of 26  Subjective Symptoms/Limitations Symptoms: S: I saw the doctor on Monday and he said to keep doing what you're doing in therapy with those weights. Pain Assessment Currently in Pain?: No/denies  Precautions/Restrictions  Precautions Precautions: Shoulder Type of Shoulder Precautions: See Scanned Protocol  Exercise/Treatments Supine Protraction: PROM;5 reps;Strengthening;12 reps Protraction Weight (lbs): 1 Horizontal ABduction: PROM;5 reps;Strengthening;12 reps Horizontal ABduction Weight (lbs): 1 External Rotation: PROM;5 reps;Strengthening;12 reps External Rotation Weight (lbs): 1 Internal Rotation: PROM;5 reps;Strengthening;12 reps Internal Rotation Weight (lbs): 1 Flexion: PROM;5 reps;Strengthening;12 reps Shoulder Flexion Weight (lbs): 1 ABduction: PROM;5 reps;Strengthening;12 reps Shoulder ABduction Weight (lbs): 1 Standing Protraction: Strengthening;12 reps Protraction Weight (lbs): 1 Horizontal ABduction: Strengthening;12 reps Horizontal ABduction Weight (lbs): 1 External Rotation: Strengthening;12 reps External Rotation Weight (lbs): 1 Internal Rotation: Strengthening;12 reps Internal Rotation Weight (lbs): 1 Flexion: Strengthening;12 reps Shoulder Flexion Weight (lbs): 1 ABduction: Strengthening;12 reps Shoulder ABduction Weight (lbs): 1 ROM / Strengthening / Isometric Strengthening UBE (Upper Arm Bike): level 1 3' forward/3' reverse Cybex Press: 2 plate;10 reps Cybex Row: 2 plate;10 reps Wall Wash: 1' with 1# "W" Arms: 10X X to V Arms: 10X with 1# Proximal Shoulder Strengthening,  Supine: 12X with 1# Proximal Shoulder Strengthening, Seated: 10X no weight      Manual Therapy Manual Therapy: Myofascial release Myofascial Release: Myofascial release to left upper arm, trapezius, and scapularis region to decrease fascial restrictions and increase joint mobility in a pain free zone  Occupational Therapy Assessment and Plan OT Assessment and Plan Clinical Impression Statement: A: Added W arms this date. patient was able to complete all 12 reps of horizontal abduction seated. Unable to do proximal shoulder strengthening with proper form using 1# weights.  OT Plan: P: Work on being able to do proximal shoulder strengthening seated with 1# while using good form.    Goals Short Term Goals Short Term Goal 1: Pt will be educated on HEP. Short Term Goal 2: Pt will attain PROM WNL for improved ability to fix her hair. Short Term Goal 3: Pt will achieve strength of atleast 4-/5 in left shoulder. Short Term Goal 4: Pt will decrease fascial restrictions to min-mod in left shoulder region Short Term Goal 5: Pt will have pain of less than 3/10 with end range movements Long Term Goals Long Term Goal 1: Pt will achieve prior level of functioning in all ADL and IADL tasks. Long Term Goal 2: Pt wil achieve AROM WNL for improved ability to fix her hair. Long Term Goal 3: Pt will have strength of 5/5 in left shoulder. Long Term Goal 4: Pt will decrease fascial restrictions in left shoulder to less than minimal for improved AROM. Long Term Goal 5: Pt will have overall left shoulder pain of less than 1/10. Long Term Goal 6: Pt will verbalize ability to don her bra in 5/5 trials.  Problem List Patient Active Problem List   Diagnosis Date Noted  . Pain in joint, shoulder region 08/02/2013  . Muscle weakness (generalized) 08/02/2013  . Decreased range of motion of left shoulder 08/02/2013  . Tight fascia 08/02/2013    End of Session Activity Tolerance: Patient tolerated treatment  well General Behavior  During Therapy: [facility] for tasks assessed/performed   Ailene Ravel, OTR/L,CBIS   09/28/2013, 10:15 AM

## 2013-10-03 ENCOUNTER — Ambulatory Visit (HOSPITAL_COMMUNITY)
Admission: RE | Admit: 2013-10-03 | Discharge: 2013-10-03 | Disposition: A | Discharge status: Home / Self Care | Payer: Medicare Other | Source: Ambulatory Visit | Attending: Orthopedic Surgery | Admitting: Orthopedic Surgery

## 2013-10-03 DIAGNOSIS — IMO0001 Reserved for inherently not codable concepts without codable children: Secondary | ICD-10-CM | POA: Diagnosis not present

## 2013-10-03 DIAGNOSIS — M629 Disorder of muscle, unspecified: Secondary | ICD-10-CM

## 2013-10-03 DIAGNOSIS — M6281 Muscle weakness (generalized): Secondary | ICD-10-CM

## 2013-10-03 DIAGNOSIS — M6289 Other specified disorders of muscle: Secondary | ICD-10-CM

## 2013-10-03 DIAGNOSIS — M25512 Pain in left shoulder: Secondary | ICD-10-CM

## 2013-10-03 NOTE — Progress Notes (Signed)
Occupational Therapy Treatment Patient Details  Name: HELMI HECHAVARRIA MRN: [id number] Date of Birth: Jun 21, 1960  Today's Date: 10/03/2013 Time: 5732-2025 OT Time Calculation (min): 43 min Therex [phone] 43'  Visit#: 18 of 24  Re-eval: 10/24/13    Authorization: Medicare  Authorization Time Period: Before 26th visit  Authorization Visit#: 18 of 26  Subjective Symptoms/Limitations Symptoms: S: My shoulder feels good. No pain. Pain Assessment Currently in Pain?: No/denies  Precautions/Restrictions  Precautions Precautions: Shoulder Type of Shoulder Precautions: See Scanned Protocol  Exercise/Treatments Supine Protraction: Strengthening;12 reps Protraction Weight (lbs): 1 Horizontal ABduction: Strengthening;12 reps Horizontal ABduction Weight (lbs): 1 External Rotation: Strengthening;12 reps External Rotation Weight (lbs): 1 Internal Rotation: Strengthening;12 reps Internal Rotation Weight (lbs): 1 Flexion: Strengthening;12 reps Shoulder Flexion Weight (lbs): 1 ABduction: Strengthening;12 reps Shoulder ABduction Weight (lbs): 1 Prone  Retraction: Strengthening;12 reps;Weights Retraction Weight (lbs): 1 Flexion: Strengthening;Weights;12 reps Flexion Weight (lbs): 1 Extension: Strengthening;12 reps;Weights Extension Weight (lbs): 1 Horizontal ABduction 1: Strengthening;Weights;12 reps Horizontal ABduction 1 Weight (lbs): 1 Standing Protraction: Strengthening;12 reps Protraction Weight (lbs): 1 Horizontal ABduction: Strengthening;12 reps;Limitations Horizontal ABduction Weight (lbs): 1 Horizontal ABduction Limitations: did 2 sets of 5 then the last 2 reps. External Rotation: Strengthening;12 reps External Rotation Weight (lbs): 1 Internal Rotation: Strengthening;12 reps Internal Rotation Weight (lbs): 1 Flexion: Strengthening;12 reps Shoulder Flexion Weight (lbs): 1 ABduction: Strengthening;12 reps Shoulder ABduction Weight (lbs): 1 ROM / Strengthening /  Isometric Strengthening UBE (Upper Arm Bike): Level 2 3' forward 3' reverse Cybex Press: 2 plate;15 reps Cybex Row: 2 plate;25 reps Thumb Tacks: 2' X to V Arms: 10X with 1# Proximal Shoulder Strengthening, Supine: 12X with 1# Proximal Shoulder Strengthening, Seated: 10X with 1#  rest breaks with every set Other ROM/Strengthening Exercises: Overhead functional reaching activitiy with 12 cones. Pt placed cones on first shelf, then up to 2nd shelf and back down to countertop.          Occupational Therapy Assessment and Plan OT Assessment and Plan Clinical Impression Statement: A: Added a functional reaching task and overhead lacing. Patient tolerated well. Patient was able to complete proximal shoulder strengthening seated using 1# in left hand only. Patient needed to rest at after each set of 10 reps. Difficulty maintaining an upright posture as patient leaned to the right to aid in shoulder exercise. OT Plan: P: Use floor length mirror for seated exercises so patient is aware of good posture.   Goals Short Term Goals Time to Complete Short Term Goals: 6 weeks Short Term Goal 1: Pt will be educated on HEP. Short Term Goal 2: Pt will attain PROM WNL for improved ability to fix her hair. Short Term Goal 3: Pt will achieve strength of atleast 4-/5 in left shoulder. Short Term Goal 4: Pt will decrease fascial restrictions to min-mod in left shoulder region Short Term Goal 5: Pt will have pain of less than 3/10 with end range movements Long Term Goals Time to Complete Long Term Goals: 12 weeks Long Term Goal 1: Pt will achieve prior level of functioning in all ADL and IADL tasks. Long Term Goal 1 Progress: Progressing toward goal Long Term Goal 2: Pt wil achieve AROM WNL for improved ability to fix her hair. Long Term Goal 3: Pt will have strength of 5/5 in left shoulder. Long Term Goal 3 Progress: Progressing toward goal Long Term Goal 4: Pt will decrease fascial restrictions in left  shoulder to less than minimal for improved AROM. Long Term Goal 5: Pt will have overall left  shoulder pain of less than 1/10. Long Term Goal 6: Pt will verbalize ability to don her bra in 5/5 trials.  Problem List Patient Active Problem List   Diagnosis Date Noted  . Pain in joint, shoulder region 08/02/2013  . Muscle weakness (generalized) 08/02/2013  . Decreased range of motion of left shoulder 08/02/2013  . Tight fascia 08/02/2013    End of Session Activity Tolerance: Patient tolerated treatment well General Behavior During Therapy: [facility] for tasks assessed/performed   Ailene Ravel, OTR/L,CBIS   10/03/2013, 10:17 AM

## 2013-10-05 ENCOUNTER — Ambulatory Visit (HOSPITAL_COMMUNITY)
Admission: RE | Admit: 2013-10-05 | Discharge: 2013-10-05 | Disposition: A | Discharge status: Home / Self Care | Payer: Medicare Other | Source: Ambulatory Visit | Attending: Orthopedic Surgery | Admitting: Orthopedic Surgery

## 2013-10-05 DIAGNOSIS — M6281 Muscle weakness (generalized): Secondary | ICD-10-CM

## 2013-10-05 DIAGNOSIS — IMO0001 Reserved for inherently not codable concepts without codable children: Secondary | ICD-10-CM | POA: Diagnosis not present

## 2013-10-05 DIAGNOSIS — M25512 Pain in left shoulder: Secondary | ICD-10-CM

## 2013-10-05 DIAGNOSIS — M6289 Other specified disorders of muscle: Secondary | ICD-10-CM

## 2013-10-05 DIAGNOSIS — M629 Disorder of muscle, unspecified: Secondary | ICD-10-CM

## 2013-10-05 NOTE — Progress Notes (Signed)
Occupational Therapy Treatment Patient Details  Name: Deborah Murray MRN: [id number] Date of Birth: 05-14-1960  Today's Date: 10/05/2013 Time: 4166-0630 OT Time Calculation (min): 41 min MFR [phone] 11' Therex [phone] 30'  Visit#: 19 of 24  Re-eval: 10/24/13    Authorization: Medicare  Authorization Time Period: Before 26th visit  Authorization Visit#: 19 of 26  Subjective Symptoms/Limitations Symptoms: S: My shoulder had a fit last night.  Pain Assessment Currently in Pain?: No/denies  Precautions/Restrictions  Precautions Precautions: Shoulder Type of Shoulder Precautions: See Scanned Protocol  Exercise/Treatments Supine Protraction: Strengthening;12 reps Protraction Weight (lbs): 1 Horizontal ABduction: Strengthening;12 reps Horizontal ABduction Weight (lbs): 1 External Rotation: Strengthening;12 reps External Rotation Weight (lbs): 1 Internal Rotation: Strengthening;12 reps Internal Rotation Weight (lbs): 1 Flexion: Strengthening;12 reps Shoulder Flexion Weight (lbs): 1 ABduction: Strengthening;12 reps Shoulder ABduction Weight (lbs): 1 Standing Protraction: Strengthening;12 reps Protraction Weight (lbs): 1 Horizontal ABduction: Strengthening;12 reps;Limitations Horizontal ABduction Weight (lbs): 1 Horizontal ABduction Limitations: did 1 sets of 5 then a set of 7 reps External Rotation: Strengthening;12 reps External Rotation Weight (lbs): 1 Internal Rotation: Strengthening;12 reps Internal Rotation Weight (lbs): 1 Flexion: Strengthening;12 reps Shoulder Flexion Weight (lbs): 1 ABduction: Strengthening;12 reps Shoulder ABduction Weight (lbs): 1 ROM / Strengthening / Isometric Strengthening UBE (Upper Arm Bike): Level 2 3' forward 3' reverse Cybex Press: 2 plate;15 reps Cybex Row: 2 plate;25 reps X to V Arms: 10X with 1# Proximal Shoulder Strengthening, Supine: 12X with 1# Proximal Shoulder Strengthening, Seated: 10X with 1#  rest breaks with every  set      Manual Therapy Manual Therapy: Myofascial release Myofascial Release: Myofascial release to left upper arm, trapezius, and scapularis region to decrease fascial restrictions and increase joint mobility in a pain free zone  Occupational Therapy Assessment and Plan OT Assessment and Plan Clinical Impression Statement: A: Used floor mirror to increase patient's posture during seated exercises. Patient need min vc's to complete proximal shoulder strengthening with good form. r OT Plan: P: Cont to work on shoulder stability and functional reaching.    Goals Short Term Goals Time to Complete Short Term Goals: 6 weeks Short Term Goal 1: Pt will be educated on HEP. Short Term Goal 2: Pt will attain PROM WNL for improved ability to fix her hair. Short Term Goal 3: Pt will achieve strength of atleast 4-/5 in left shoulder. Short Term Goal 4: Pt will decrease fascial restrictions to min-mod in left shoulder region Short Term Goal 5: Pt will have pain of less than 3/10 with end range movements Long Term Goals Time to Complete Long Term Goals: 12 weeks Long Term Goal 1: Pt will achieve prior level of functioning in all ADL and IADL tasks. Long Term Goal 2: Pt wil achieve AROM WNL for improved ability to fix her hair. Long Term Goal 3: Pt will have strength of 5/5 in left shoulder. Long Term Goal 4: Pt will decrease fascial restrictions in left shoulder to less than minimal for improved AROM. Long Term Goal 5: Pt will have overall left shoulder pain of less than 1/10. Long Term Goal 6: Pt will verbalize ability to don her bra in 5/5 trials.  Problem List Patient Active Problem List   Diagnosis Date Noted  . Pain in joint, shoulder region 08/02/2013  . Muscle weakness (generalized) 08/02/2013  . Decreased range of motion of left shoulder 08/02/2013  . Tight fascia 08/02/2013    End of Session Activity Tolerance: Patient tolerated treatment well General Behavior During Therapy: [facility]  for tasks assessed/performed   Ailene Ravel, OTR/L,CBIS   10/05/2013, 10:11 AM

## 2013-10-10 ENCOUNTER — Ambulatory Visit (HOSPITAL_COMMUNITY)
Admission: RE | Admit: 2013-10-10 | Discharge: 2013-10-10 | Disposition: A | Discharge status: Home / Self Care | Payer: Medicare Other | Source: Ambulatory Visit

## 2013-10-10 DIAGNOSIS — IMO0001 Reserved for inherently not codable concepts without codable children: Secondary | ICD-10-CM | POA: Diagnosis not present

## 2013-10-10 DIAGNOSIS — M25512 Pain in left shoulder: Secondary | ICD-10-CM

## 2013-10-10 DIAGNOSIS — M629 Disorder of muscle, unspecified: Secondary | ICD-10-CM

## 2013-10-10 DIAGNOSIS — M6281 Muscle weakness (generalized): Secondary | ICD-10-CM

## 2013-10-10 DIAGNOSIS — M6289 Other specified disorders of muscle: Secondary | ICD-10-CM

## 2013-10-10 NOTE — Progress Notes (Signed)
Occupational Therapy Treatment Patient Details  Name: Deborah Murray MRN: [id number] Date of Birth: [birth year]  Today's Date: 10/10/2013 Time: 9528-4132 OT Time Calculation (min): 42 min MFR 440-102 10' Therex [phone] 32'  Visit#: 20 of 24  Re-eval: 10/24/13    Authorization: Medicare  Authorization Time Period: Before 26th visit  Authorization Visit#: 20 of 26  Subjective Symptoms/Limitations Symptoms: S: This weekend my shoulder was just hurting.  Pain Assessment Currently in Pain?: No/denies  Precautions/Restrictions  Precautions Precautions: Shoulder Type of Shoulder Precautions: See Scanned Protocol  Exercise/Treatments Supine Protraction: Strengthening;15 reps Protraction Weight (lbs): 1 Horizontal ABduction: Strengthening;15 reps Horizontal ABduction Weight (lbs): 1 External Rotation: Strengthening;15 reps External Rotation Weight (lbs): 1 Internal Rotation: Strengthening;15 reps Internal Rotation Weight (lbs): 1 Flexion: Strengthening;15 reps Shoulder Flexion Weight (lbs): 1 ABduction: Strengthening;15 reps Shoulder ABduction Weight (lbs): 1 Standing Protraction: Strengthening;15 reps Protraction Weight (lbs): 1 Horizontal ABduction: Strengthening;15 reps Horizontal ABduction Weight (lbs): 1 External Rotation: Strengthening;15 reps External Rotation Weight (lbs): 1 Internal Rotation: Strengthening;15 reps Internal Rotation Weight (lbs): 1 Flexion: Strengthening;15 reps Shoulder Flexion Weight (lbs): 1 ABduction: Strengthening;15 reps Shoulder ABduction Weight (lbs): 1 ROM / Strengthening / Isometric Strengthening UBE (Upper Arm Bike): Level 3 3' forward 3' reverse Cybex Press: 2 plate;20 reps Cybex Row: 2 plate;20 reps X to V Arms: 10X with 1# Proximal Shoulder Strengthening, Supine: 15X with 1# Proximal Shoulder Strengthening, Seated: 10X with 1#  rest breaks with every set Ball on Wall: 1' flexion 1' abduction green      Manual  Therapy Manual Therapy: Myofascial release Myofascial Release: Myofascial release to left upper arm, trapezius, and scapularis region to decrease fascial restrictions and increase joint mobility in a pain free zone  Occupational Therapy Assessment and Plan OT Assessment and Plan Clinical Impression Statement: A: Increased reps with supine and seated exercises. Patient requested to increase to level 3 on UBE bike. Patient tolerated well. Needed only min vc's for correct posture during seated exercises this session. Patient is showing improvement.  OT Plan: P: Add overhead lacing.    Goals Short Term Goals Time to Complete Short Term Goals: 6 weeks Short Term Goal 1: Pt will be educated on HEP. Short Term Goal 2: Pt will attain PROM WNL for improved ability to fix her hair. Short Term Goal 3: Pt will achieve strength of atleast 4-/5 in left shoulder. Short Term Goal 4: Pt will decrease fascial restrictions to min-mod in left shoulder region Short Term Goal 5: Pt will have pain of less than 3/10 with end range movements Long Term Goals Time to Complete Long Term Goals: 12 weeks Long Term Goal 1: Pt will achieve prior level of functioning in all ADL and IADL tasks. Long Term Goal 1 Progress: Progressing toward goal Long Term Goal 2: Pt wil achieve AROM WNL for improved ability to fix her hair. Long Term Goal 3: Pt will have strength of 5/5 in left shoulder. Long Term Goal 3 Progress: Progressing toward goal Long Term Goal 4: Pt will decrease fascial restrictions in left shoulder to less than minimal for improved AROM. Long Term Goal 5: Pt will have overall left shoulder pain of less than 1/10. Long Term Goal 6: Pt will verbalize ability to don her bra in 5/5 trials.  Problem List Patient Active Problem List   Diagnosis Date Noted  . Pain in joint, shoulder region 08/02/2013  . Muscle weakness (generalized) 08/02/2013  . Decreased range of motion of left shoulder 08/02/2013  . Tight  fascia  08/02/2013    End of Session Activity Tolerance: Patient tolerated treatment well General Behavior During Therapy: [facility] for tasks assessed/performed   Ailene Ravel, OTR/L,CBIS   10/10/2013, 10:14 AM

## 2013-10-12 ENCOUNTER — Ambulatory Visit (HOSPITAL_COMMUNITY): Payer: Medicare Other

## 2013-10-17 ENCOUNTER — Ambulatory Visit (HOSPITAL_COMMUNITY)
Admission: RE | Admit: 2013-10-17 | Discharge: 2013-10-17 | Disposition: A | Discharge status: Home / Self Care | Payer: Medicare Other | Source: Ambulatory Visit | Attending: Orthopedic Surgery | Admitting: Orthopedic Surgery

## 2013-10-17 DIAGNOSIS — IMO0001 Reserved for inherently not codable concepts without codable children: Secondary | ICD-10-CM | POA: Diagnosis not present

## 2013-10-17 DIAGNOSIS — M25512 Pain in left shoulder: Secondary | ICD-10-CM

## 2013-10-17 DIAGNOSIS — M629 Disorder of muscle, unspecified: Secondary | ICD-10-CM

## 2013-10-17 DIAGNOSIS — M6289 Other specified disorders of muscle: Secondary | ICD-10-CM

## 2013-10-17 DIAGNOSIS — M6281 Muscle weakness (generalized): Secondary | ICD-10-CM

## 2013-10-17 DIAGNOSIS — M25519 Pain in unspecified shoulder: Secondary | ICD-10-CM | POA: Diagnosis not present

## 2013-10-17 DIAGNOSIS — M25619 Stiffness of unspecified shoulder, not elsewhere classified: Secondary | ICD-10-CM | POA: Diagnosis not present

## 2013-10-17 NOTE — Progress Notes (Signed)
Occupational Therapy Treatment Patient Details  Name: Deborah Murray MRN: [id number] Date of Birth: [birth year]  Today's Date: 10/17/2013 Time: 1497-0263 OT Time Calculation (min): 40 min MFR 935-947 12' Therex [phone] 28'  Visit#: 21 of 24  Re-eval: 10/24/13    Authorization: Medicare  Authorization Time Period: Before 26th visit  Authorization Visit#: 21 of 26  Subjective Symptoms/Limitations Symptoms: S: My shoulder feels real good today.  Pain Assessment Currently in Pain?: No/denies  Precautions/Restrictions  Precautions Precautions: Shoulder Type of Shoulder Precautions: See Scanned Protocol  Exercise/Treatments Supine Protraction: Strengthening;12 reps Protraction Weight (lbs): 2 Horizontal ABduction: Strengthening;12 reps Horizontal ABduction Weight (lbs): 2 External Rotation: Strengthening;12 reps External Rotation Weight (lbs): 2 Internal Rotation: Strengthening;12 reps Internal Rotation Weight (lbs): 2 Flexion: Strengthening;12 reps Shoulder Flexion Weight (lbs): 2 ABduction: Strengthening;12 reps Shoulder ABduction Weight (lbs): 2 Standing Protraction: Strengthening;10 reps Protraction Weight (lbs): 2 Horizontal ABduction: Strengthening;10 reps Horizontal ABduction Weight (lbs): 2 External Rotation: Strengthening;10 reps External Rotation Weight (lbs): 2 Internal Rotation: Strengthening;10 reps Internal Rotation Weight (lbs): 2 Flexion: Strengthening;10 reps Shoulder Flexion Weight (lbs): 2 ABduction: Strengthening;10 reps Shoulder ABduction Weight (lbs): 2 ROM / Strengthening / Isometric Strengthening UBE (Upper Arm Bike): Level 3 3' forward 3' reverse Over Head Lace: 2' X to V Arms: 12X with 1# Proximal Shoulder Strengthening, Supine: 12X with 2# Proximal Shoulder Strengthening, Seated: 10X with 1#  one rest break      Manual Therapy Manual Therapy: Myofascial release Myofascial Release: Myofascial release to left upper arm, trapezius,  and scapularis region to decrease fascial restrictions and increase joint mobility in a pain free zone  Occupational Therapy Assessment and Plan OT Assessment and Plan Clinical Impression Statement: A: Increased to 2# hand weight for supine and some of seated exercises. Added overhead lacing activity .  Patient tolerated well.  OT Plan: P: Continue to work on increasing shoulder and scapular strength and stability.    Goals Short Term Goals Time to Complete Short Term Goals: 6 weeks Short Term Goal 1: Pt will be educated on HEP. Short Term Goal 2: Pt will attain PROM WNL for improved ability to fix her hair. Short Term Goal 3: Pt will achieve strength of atleast 4-/5 in left shoulder. Short Term Goal 4: Pt will decrease fascial restrictions to min-mod in left shoulder region Short Term Goal 5: Pt will have pain of less than 3/10 with end range movements Long Term Goals Time to Complete Long Term Goals: 12 weeks Long Term Goal 1: Pt will achieve prior level of functioning in all ADL and IADL tasks. Long Term Goal 1 Progress: Progressing toward goal Long Term Goal 2: Pt wil achieve AROM WNL for improved ability to fix her hair. Long Term Goal 3: Pt will have strength of 5/5 in left shoulder. Long Term Goal 3 Progress: Progressing toward goal Long Term Goal 4: Pt will decrease fascial restrictions in left shoulder to less than minimal for improved AROM. Long Term Goal 5: Pt will have overall left shoulder pain of less than 1/10. Long Term Goal 6: Pt will verbalize ability to don her bra in 5/5 trials.  Problem List Patient Active Problem List   Diagnosis Date Noted  . Pain in joint, shoulder region 08/02/2013  . Muscle weakness (generalized) 08/02/2013  . Decreased range of motion of left shoulder 08/02/2013  . Tight fascia 08/02/2013    End of Session Activity Tolerance: Patient tolerated treatment well General Behavior During Therapy: [facility] for tasks assessed/performed   Mickel Baas  Curley Hogen, OTR/L,CBIS   10/17/2013, 10:11 AM

## 2013-10-19 ENCOUNTER — Ambulatory Visit (HOSPITAL_COMMUNITY)
Admission: RE | Admit: 2013-10-19 | Discharge: 2013-10-19 | Disposition: A | Discharge status: Home / Self Care | Payer: Medicare Other | Source: Ambulatory Visit

## 2013-10-19 DIAGNOSIS — M25512 Pain in left shoulder: Secondary | ICD-10-CM

## 2013-10-19 DIAGNOSIS — M6281 Muscle weakness (generalized): Secondary | ICD-10-CM

## 2013-10-19 DIAGNOSIS — M629 Disorder of muscle, unspecified: Secondary | ICD-10-CM

## 2013-10-19 DIAGNOSIS — IMO0001 Reserved for inherently not codable concepts without codable children: Secondary | ICD-10-CM | POA: Diagnosis not present

## 2013-10-19 DIAGNOSIS — M6289 Other specified disorders of muscle: Secondary | ICD-10-CM

## 2013-10-19 NOTE — Progress Notes (Signed)
Occupational Therapy Treatment Patient Details  Name: Deborah Murray MRN: [id number] Date of Birth: February 10, 1961  Today's Date: 10/19/2013 Time: 6387-5643 OT Time Calculation (min): 32 min Therex [id number] 32'  Visit#: 22 of 24  Re-eval: 10/24/13    Authorization: Medicare  Authorization Time Period: Before 26th visit  Authorization Visit#: 22 of 26  Subjective Symptoms/Limitations Symptoms: S: My shoulder doesn't hurt today.  Pain Assessment Currently in Pain?: No/denies  Precautions/Restrictions  Precautions Precautions: Shoulder Type of Shoulder Precautions: See Scanned Protocol  Exercise/Treatments Supine Protraction: Strengthening;12 reps Protraction Weight (lbs): 2 Horizontal ABduction: Strengthening;12 reps Horizontal ABduction Weight (lbs): 2 External Rotation: Strengthening;12 reps External Rotation Weight (lbs): 2 Internal Rotation: Strengthening;12 reps Internal Rotation Weight (lbs): 2 Flexion: Strengthening;12 reps Shoulder Flexion Weight (lbs): 2 ABduction: Strengthening;12 reps Shoulder ABduction Weight (lbs): 2 Standing Protraction: Strengthening;10 reps Protraction Weight (lbs): 2 Horizontal ABduction: Strengthening;10 reps Horizontal ABduction Weight (lbs): 2 External Rotation: Strengthening;10 reps External Rotation Weight (lbs): 2 Internal Rotation: Strengthening;10 reps Internal Rotation Weight (lbs): 2 Flexion: Strengthening;10 reps Shoulder Flexion Weight (lbs): 2 ABduction: Strengthening;10 reps Shoulder ABduction Weight (lbs): 2 AROM / Strengthening / Isometric Strengthening UBE (Upper Arm Bike): Level 3 3' forward 3' reverse Cybex Press: 2 plate;20 reps Cybex Row: 2 plate;20 reps Over Head Lace: 2' Proximal Shoulder Strengthening, Supine: 12X with 2# Proximal Shoulder Strengthening, Seated: 10X with 1#  with rest breaks after each set. Reminder to make bigger circles to incorporate shoulder movement. Ball on Wall: 1' flexion 1'  abduction green    Occupational Therapy Assessment and Plan OT Assessment and Plan Clinical Impression Statement: A: Patient completed all exercises with good form and technique. Patient continues to need rest breaks when completing proximal shoulder strengthening seated.  OT Plan: P: Reassess for possible D/C.   Goals Short Term Goals Time to Complete Short Term Goals: 6 weeks Short Term Goal 1: Pt will be educated on HEP. Short Term Goal 2: Pt will attain PROM WNL for improved ability to fix her hair. Short Term Goal 3: Pt will achieve strength of atleast 4-/5 in left shoulder. Short Term Goal 4: Pt will decrease fascial restrictions to min-mod in left shoulder region Short Term Goal 5: Pt will have pain of less than 3/10 with end range movements Long Term Goals Time to Complete Long Term Goals: 12 weeks Long Term Goal 1: Pt will achieve prior level of functioning in all ADL and IADL tasks. Long Term Goal 1 Progress: Progressing toward goal Long Term Goal 2: Pt wil achieve AROM WNL for improved ability to fix her hair. Long Term Goal 3: Pt will have strength of 5/5 in left shoulder. Long Term Goal 3 Progress: Progressing toward goal Long Term Goal 4: Pt will decrease fascial restrictions in left shoulder to less than minimal for improved AROM. Long Term Goal 5: Pt will have overall left shoulder pain of less than 1/10. Long Term Goal 6: Pt will verbalize ability to don her bra in 5/5 trials.  Problem List Patient Active Problem List   Diagnosis Date Noted  . Pain in joint, shoulder region 08/02/2013  . Muscle weakness (generalized) 08/02/2013  . Decreased range of motion of left shoulder 08/02/2013  . Tight fascia 08/02/2013    End of Session Activity Tolerance: Patient tolerated treatment well General Behavior During Therapy: [facility] for tasks assessed/performed   Ailene Ravel, OTR/L,CBIS   10/19/2013, 10:59 AM

## 2013-10-24 ENCOUNTER — Ambulatory Visit (HOSPITAL_COMMUNITY)
Admission: RE | Admit: 2013-10-24 | Discharge: 2013-10-24 | Disposition: A | Discharge status: Home / Self Care | Payer: Medicare Other | Source: Ambulatory Visit

## 2013-10-24 DIAGNOSIS — M6281 Muscle weakness (generalized): Secondary | ICD-10-CM

## 2013-10-24 DIAGNOSIS — M629 Disorder of muscle, unspecified: Secondary | ICD-10-CM

## 2013-10-24 DIAGNOSIS — IMO0001 Reserved for inherently not codable concepts without codable children: Secondary | ICD-10-CM | POA: Diagnosis not present

## 2013-10-24 DIAGNOSIS — M6289 Other specified disorders of muscle: Secondary | ICD-10-CM

## 2013-10-24 DIAGNOSIS — M25512 Pain in left shoulder: Secondary | ICD-10-CM

## 2013-10-24 NOTE — Evaluation (Signed)
Occupational Therapy Reassessment and Discharge  Patient Details  Name: Deborah Murray MRN: [id number] Date of Birth: Sep 04, 1960  Today's Date: 10/24/2013 Time: 0930-0950 OT Time Calculation (min): 20 min Reassess 930-950 20'  Visit#: 23 of 24  Re-eval:    Assessment Diagnosis: Left RCR  Authorization: Medicare  Authorization Time Period: Before 26th visit  Authorization Visit#: 66 of 26   Past Medical History:  Past Medical History  Diagnosis Date  . Asthma   . Hyperlipidemia   . Hypertension   . Syncope and collapse 05/11/11  adrenal insuffiency  treated at [facility]  . Thyroid disease    Past Surgical History:  Past Surgical History  Procedure Laterality Date  . Cardiac catheterization  2012 at [facility]  . 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 Abdominal hysterectomy    . Cholecystectomy, laparoscopic    . Eye surg for glaucoma    . Right knee surg    . Cholecystectomy      Subjective Symptoms/Limitations Symptoms: S: My elbow was hurting me and the doctor recommended that I get shots. Special Tests: FOTO: 71/100 (previous 67/100) Pain Assessment Currently in Pain?: No/denies  Precautions/Restrictions  Precautions Precautions: Shoulder Type of Shoulder Precautions: See Scanned Protocol   Assessment Additional Assessments LUE AROM (degrees) LUE Overall AROM Comments: Assessed seated. IR/ER adducted. Left Shoulder Flexion: 161 Degrees (last progress note: 161) Left Shoulder ABduction: 157 Degrees (last progress note: 157) Left Shoulder Internal Rotation: 90 Degrees (on last progres: same) Left Shoulder External Rotation: 90 Degrees (last progress note: 84) LUE Strength LUE Overall Strength:  (assessed seated. IR/ER adducted) Left Shoulder Flexion: 3+/5 (last progress note: 3+/5) Left Shoulder ABduction: 4/5 (same at last progress note) Left Shoulder Internal Rotation: 4/5 (same at last progress note) Left Shoulder External Rotation: 5/5  (last progress note: 4/5) Palpation Palpation: Trace fascial restrictions in left upper arm, trapezius, and scapularis region.       Occupational Therapy Assessment and Plan OT Assessment and Plan Clinical Impression Statement: A: Reassessment and Discharge completed this date. Patient has met all STGs and 5/6 LTGs. No improve to AROM measurements. Patient regressed with shoulder flexion strength, made no change with abduction and IR strength, and showed inprovement with ER strength. Patient will continue to complete HEP at home independently.  OT Plan: P: D/C from therapy.   Goals Short Term Goals Time to Complete Short Term Goals: 6 weeks Short Term Goal 1: Pt will be educated on HEP. Short Term Goal 2: Pt will attain PROM WNL for improved ability to fix her hair. Short Term Goal 3: Pt will achieve strength of atleast 4-/5 in left shoulder. Short Term Goal 4: Pt will decrease fascial restrictions to min-mod in left shoulder region Short Term Goal 5: Pt will have pain of less than 3/10 with end range movements Long Term Goals Time to Complete Long Term Goals: 12 weeks Long Term Goal 1: Pt will achieve prior level of functioning in all ADL and IADL tasks. Long Term Goal 1 Progress: Met Long Term Goal 2: Pt wil achieve AROM WNL for improved ability to fix her hair. Long Term Goal 3: Pt will have strength of 5/5 in left shoulder. Long Term Goal 3 Progress: Not met Long Term Goal 4: Pt will decrease fascial restrictions in left shoulder to less than minimal for improved AROM. Long Term Goal 5: Pt will have overall left shoulder pain of less than 1/10. Long Term Goal 6: Pt  will verbalize ability to don her bra in 5/5 trials.  Problem List Patient Active Problem List   Diagnosis Date Noted  . Pain in joint, shoulder region 08/02/2013  . Muscle weakness (generalized) 08/02/2013  . Decreased range of motion of left shoulder 08/02/2013  . Tight fascia 08/02/2013    End of  Session Activity Tolerance: Patient tolerated treatment well General Behavior During Therapy: WFL for tasks assessed/performed  GO Functional Assessment Tool Used: FOTO score: 71/100 (prevous: 67/100) Functional Limitation: Carrying, moving and handling objects Carrying, Moving and Handling Objects Current Status (Q2060): At least 20 percent but less than 40 percent impaired, limited or restricted Carrying, Moving and Handling Objects Goal Status [phone]): At least 1 percent but less than 20 percent impaired, limited or restricted Carrying, Moving and Handling Objects Discharge Status [phone]): At least 20 percent but less than 40 percent impaired, limited or restricted  Ailene Ravel, OTR/L,CBIS   10/24/2013, 9:57 AM  Physician Documentation Your signature is required to indicate approval of the treatment plan as stated above.  Please sign and either send electronically or make a copy of this report for your files and return this physician signed original.  Please mark one 1.__approve of plan  2. ___approve of plan with the following conditions.   ______________________________                                                          _____________________ Physician Signature                                                                                                             Date

## 2013-10-26 ENCOUNTER — Ambulatory Visit (HOSPITAL_COMMUNITY): Payer: Medicare Other

## 2014-05-12 ENCOUNTER — Inpatient Hospital Stay (HOSPITAL_COMMUNITY)
Admission: EM | Admit: 2014-05-12 | Discharge: 2014-05-13 | DRG: 309 | Disposition: A | Discharge status: Home / Self Care | Payer: Medicare Other | Attending: Internal Medicine | Admitting: Internal Medicine

## 2014-05-12 ENCOUNTER — Encounter (HOSPITAL_COMMUNITY): Payer: Self-pay

## 2014-05-12 ENCOUNTER — Emergency Department (HOSPITAL_COMMUNITY): Payer: Medicare Other

## 2014-05-12 DIAGNOSIS — J029 Acute pharyngitis, unspecified: Secondary | ICD-10-CM | POA: Diagnosis present

## 2014-05-12 DIAGNOSIS — E785 Hyperlipidemia, unspecified: Secondary | ICD-10-CM | POA: Diagnosis present

## 2014-05-12 DIAGNOSIS — I959 Hypotension, unspecified: Secondary | ICD-10-CM | POA: Diagnosis present

## 2014-05-12 DIAGNOSIS — Z885 Allergy status to narcotic agent status: Secondary | ICD-10-CM | POA: Diagnosis not present

## 2014-05-12 DIAGNOSIS — I4891 Unspecified atrial fibrillation: Secondary | ICD-10-CM | POA: Diagnosis not present

## 2014-05-12 DIAGNOSIS — I482 Chronic atrial fibrillation, unspecified: Secondary | ICD-10-CM | POA: Diagnosis present

## 2014-05-12 DIAGNOSIS — J4550 Severe persistent asthma, uncomplicated: Secondary | ICD-10-CM | POA: Diagnosis present

## 2014-05-12 DIAGNOSIS — Z880 Allergy status to penicillin: Secondary | ICD-10-CM

## 2014-05-12 DIAGNOSIS — E876 Hypokalemia: Secondary | ICD-10-CM | POA: Diagnosis not present

## 2014-05-12 DIAGNOSIS — E039 Hypothyroidism, unspecified: Secondary | ICD-10-CM | POA: Diagnosis present

## 2014-05-12 DIAGNOSIS — Z886 Allergy status to analgesic agent status: Secondary | ICD-10-CM | POA: Diagnosis not present

## 2014-05-12 DIAGNOSIS — E0590 Thyrotoxicosis, unspecified without thyrotoxic crisis or storm: Secondary | ICD-10-CM | POA: Diagnosis present

## 2014-05-12 DIAGNOSIS — Z6841 Body Mass Index (BMI) 40.0 and over, adult: Secondary | ICD-10-CM | POA: Diagnosis not present

## 2014-05-12 DIAGNOSIS — Z9989 Dependence on other enabling machines and devices: Secondary | ICD-10-CM

## 2014-05-12 DIAGNOSIS — E66813 Obesity, class 3: Secondary | ICD-10-CM | POA: Diagnosis present

## 2014-05-12 DIAGNOSIS — Z9884 Bariatric surgery status: Secondary | ICD-10-CM | POA: Diagnosis not present

## 2014-05-12 DIAGNOSIS — I10 Essential (primary) hypertension: Secondary | ICD-10-CM | POA: Diagnosis present

## 2014-05-12 DIAGNOSIS — H409 Unspecified glaucoma: Secondary | ICD-10-CM | POA: Diagnosis present

## 2014-05-12 DIAGNOSIS — Z882 Allergy status to sulfonamides status: Secondary | ICD-10-CM | POA: Diagnosis not present

## 2014-05-12 DIAGNOSIS — E0520 Thyrotoxicosis with toxic multinodular goiter without thyrotoxic crisis or storm: Secondary | ICD-10-CM | POA: Diagnosis present

## 2014-05-12 DIAGNOSIS — G4733 Obstructive sleep apnea (adult) (pediatric): Secondary | ICD-10-CM | POA: Diagnosis present

## 2014-05-12 LAB — URINALYSIS, ROUTINE W REFLEX MICROSCOPIC
Bilirubin Urine: NEGATIVE
Glucose, UA: NEGATIVE mg/dL
Hgb urine dipstick: NEGATIVE
KETONES UR: NEGATIVE mg/dL
LEUKOCYTES UA: NEGATIVE
Nitrite: NEGATIVE
PROTEIN: NEGATIVE mg/dL
Specific Gravity, Urine: 1.023 (ref 1.005–1.030)
Urobilinogen, UA: 1 mg/dL (ref 0.0–1.0)
pH: 6.5 (ref 5.0–8.0)

## 2014-05-12 LAB — COMPREHENSIVE METABOLIC PANEL
ALBUMIN: 3.3 g/dL — AB (ref 3.5–5.2)
ALT: 19 U/L (ref 0–35)
AST: 36 U/L (ref 0–37)
Alkaline Phosphatase: 61 U/L (ref 39–117)
Anion gap: 8 (ref 5–15)
BUN: 17 mg/dL (ref 6–23)
CHLORIDE: 111 mmol/L (ref 96–112)
CO2: 26 mmol/L (ref 19–32)
CREATININE: 1.2 mg/dL — AB (ref 0.50–1.10)
Calcium: 8.7 mg/dL (ref 8.4–10.5)
GFR, EST AFRICAN AMERICAN: 58 mL/min — AB (ref >90)
GFR, EST NON AFRICAN AMERICAN: 50 mL/min — AB (ref >90)
Glucose, Bld: 75 mg/dL (ref 70–99)
Potassium: 2.9 mmol/L — ABNORMAL LOW (ref 3.5–5.1)
SODIUM: 145 mmol/L (ref 135–145)
Total Bilirubin: 0.4 mg/dL (ref 0.3–1.2)
Total Protein: 5.9 g/dL — ABNORMAL LOW (ref 6.0–8.3)

## 2014-05-12 LAB — CBC WITH DIFFERENTIAL/PLATELET
BASOS ABS: 0.1 10*3/uL (ref 0.0–0.1)
Basophils Relative: 1 % (ref 0–1)
EOS PCT: 0 % (ref 0–5)
Eosinophils Absolute: 0 10*3/uL (ref 0.0–0.7)
HEMATOCRIT: 44.7 % (ref 36.0–46.0)
Hemoglobin: 14 g/dL (ref 12.0–15.0)
LYMPHS ABS: 1.1 10*3/uL (ref 0.7–4.0)
LYMPHS PCT: 14 % (ref 12–46)
MCH: 29.1 pg (ref 26.0–34.0)
MCHC: 31.3 g/dL (ref 30.0–36.0)
MCV: 92.9 fL (ref 78.0–100.0)
MONOS PCT: 6 % (ref 3–12)
Monocytes Absolute: 0.4 10*3/uL (ref 0.1–1.0)
NEUTROS ABS: 6 10*3/uL (ref 1.7–7.7)
Neutrophils Relative %: 79 % — ABNORMAL HIGH (ref 43–77)
PLATELETS: 259 10*3/uL (ref 150–400)
RBC: 4.81 MIL/uL (ref 3.87–5.11)
RDW: 14.9 % (ref 11.5–15.5)
WBC: 7.6 10*3/uL (ref 4.0–10.5)

## 2014-05-12 LAB — PROTIME-INR
INR: 0.89 (ref 0.00–1.49)
PROTHROMBIN TIME: 12.1 s (ref 11.6–15.2)

## 2014-05-12 LAB — TSH: TSH: 0.58 u[IU]/mL (ref 0.350–4.500)

## 2014-05-12 LAB — I-STAT TROPONIN, ED: TROPONIN I, POC: 0.01 ng/mL (ref 0.00–0.08)

## 2014-05-12 LAB — BRAIN NATRIURETIC PEPTIDE: B NATRIURETIC PEPTIDE 5: 84.7 pg/mL (ref 0.0–100.0)

## 2014-05-12 LAB — I-STAT CG4 LACTIC ACID, ED: LACTIC ACID, VENOUS: 3.48 mmol/L — AB (ref 0.5–2.0)

## 2014-05-12 MED ORDER — METHIMAZOLE 5 MG PO TABS
5.0000 mg | ORAL_TABLET | Freq: Every day | ORAL | Status: DC
  Administered 2014-05-12: 5 mg via ORAL
  Filled 2014-05-12 (×2): qty 1

## 2014-05-12 MED ORDER — DILTIAZEM HCL ER COATED BEADS 240 MG PO CP24
240.0000 mg | ORAL_CAPSULE | Freq: Every day | ORAL | Status: DC
  Administered 2014-05-13: 240 mg via ORAL
  Filled 2014-05-12: qty 1

## 2014-05-12 MED ORDER — METOPROLOL TARTRATE 25 MG PO TABS
25.0000 mg | ORAL_TABLET | Freq: Once | ORAL | Status: AC
  Administered 2014-05-12: 25 mg via ORAL
  Filled 2014-05-12: qty 1

## 2014-05-12 MED ORDER — POTASSIUM CHLORIDE CRYS ER 20 MEQ PO TBCR
40.0000 meq | EXTENDED_RELEASE_TABLET | Freq: Once | ORAL | Status: AC
  Administered 2014-05-12: 40 meq via ORAL
  Filled 2014-05-12: qty 2

## 2014-05-12 MED ORDER — DILTIAZEM HCL ER COATED BEADS 240 MG PO CP24
240.0000 mg | ORAL_CAPSULE | Freq: Once | ORAL | Status: AC
  Administered 2014-05-12: 240 mg via ORAL
  Filled 2014-05-12: qty 1

## 2014-05-12 MED ORDER — SODIUM CHLORIDE 0.9 % IJ SOLN
3.0000 mL | Freq: Two times a day (BID) | INTRAMUSCULAR | Status: DC
  Administered 2014-05-12 – 2014-05-13 (×2): 3 mL via INTRAVENOUS

## 2014-05-12 MED ORDER — DOCUSATE SODIUM 100 MG PO CAPS: 100.0000 mg | ORAL_CAPSULE | Freq: Every day | ORAL | Status: DC | PRN

## 2014-05-12 MED ORDER — MULTIVITAMINS PO CAPS: 1.0000 | ORAL_CAPSULE | Freq: Every day | ORAL | Status: DC

## 2014-05-12 MED ORDER — CITALOPRAM HYDROBROMIDE 20 MG PO TABS
20.0000 mg | ORAL_TABLET | Freq: Every day | ORAL | Status: DC
  Administered 2014-05-12: 20 mg via ORAL
  Filled 2014-05-12 (×2): qty 1

## 2014-05-12 MED ORDER — BRIMONIDINE TARTRATE 0.15 % OP SOLN
1.0000 [drp] | Freq: Two times a day (BID) | OPHTHALMIC | Status: DC
  Administered 2014-05-12 – 2014-05-13 (×2): 1 [drp] via OPHTHALMIC
  Filled 2014-05-12: qty 5

## 2014-05-12 MED ORDER — ALBUTEROL SULFATE HFA 108 (90 BASE) MCG/ACT IN AERS: 2.0000 | INHALATION_SPRAY | RESPIRATORY_TRACT | Status: DC | PRN

## 2014-05-12 MED ORDER — MONTELUKAST SODIUM 10 MG PO TABS
10.0000 mg | ORAL_TABLET | Freq: Every day | ORAL | Status: DC
  Administered 2014-05-12: 10 mg via ORAL
  Filled 2014-05-12 (×2): qty 1

## 2014-05-12 MED ORDER — RIVAROXABAN 20 MG PO TABS
20.0000 mg | ORAL_TABLET | Freq: Every day | ORAL | Status: DC
  Filled 2014-05-12: qty 1

## 2014-05-12 MED ORDER — VITAMIN D (ERGOCALCIFEROL) 1.25 MG (50000 UNIT) PO CAPS
50000.0000 [IU] | ORAL_CAPSULE | ORAL | Status: DC
  Administered 2014-05-13: 50000 [IU] via ORAL
  Filled 2014-05-12: qty 1

## 2014-05-12 MED ORDER — SODIUM CHLORIDE 0.9 % IV BOLUS (SEPSIS): 1000.0000 mL | Freq: Once | INTRAVENOUS | Status: DC

## 2014-05-12 MED ORDER — THEOPHYLLINE ER 300 MG PO TB12
300.0000 mg | ORAL_TABLET | Freq: Every day | ORAL | Status: DC
Start: 2014-05-12 — End: 2014-05-13
  Administered 2014-05-12: 300 mg via ORAL
  Filled 2014-05-12 (×2): qty 1

## 2014-05-12 MED ORDER — DILTIAZEM HCL 100 MG IV SOLR
5.0000 mg/h | INTRAVENOUS | Status: DC
  Administered 2014-05-12: 5 mg/h via INTRAVENOUS

## 2014-05-12 MED ORDER — FLUTICASONE PROPIONATE HFA 44 MCG/ACT IN AERO
2.0000 | INHALATION_SPRAY | Freq: Two times a day (BID) | RESPIRATORY_TRACT | Status: DC
  Administered 2014-05-12 – 2014-05-13 (×2): 2 via RESPIRATORY_TRACT
  Filled 2014-05-12: qty 10.6

## 2014-05-12 MED ORDER — PREDNISONE 5 MG PO TABS
35.0000 mg | ORAL_TABLET | Freq: Every day | ORAL | Status: DC
  Administered 2014-05-13: 35 mg via ORAL
  Filled 2014-05-12 (×2): qty 1

## 2014-05-12 MED ORDER — BECLOMETHASONE DIPROPIONATE 40 MCG/ACT IN AERS: 2.0000 | INHALATION_SPRAY | Freq: Two times a day (BID) | RESPIRATORY_TRACT | Status: DC

## 2014-05-12 MED ORDER — ONDANSETRON 4 MG PO TBDP: 4.0000 mg | ORAL_TABLET | Freq: Three times a day (TID) | ORAL | Status: DC | PRN

## 2014-05-12 MED ORDER — RIVAROXABAN 20 MG PO TABS
20.0000 mg | ORAL_TABLET | Freq: Once | ORAL | Status: AC
  Administered 2014-05-12: 20 mg via ORAL
  Filled 2014-05-12: qty 1

## 2014-05-12 MED ORDER — ALBUTEROL SULFATE (2.5 MG/3ML) 0.083% IN NEBU
2.5000 mg | INHALATION_SOLUTION | Freq: Four times a day (QID) | RESPIRATORY_TRACT | Status: DC | PRN
  Administered 2014-05-12 – 2014-05-13 (×2): 2.5 mg via RESPIRATORY_TRACT
  Filled 2014-05-12 (×2): qty 3

## 2014-05-12 MED ORDER — PANTOPRAZOLE SODIUM 40 MG PO TBEC
40.0000 mg | DELAYED_RELEASE_TABLET | Freq: Every day | ORAL | Status: DC
  Administered 2014-05-13: 40 mg via ORAL
  Filled 2014-05-12: qty 1

## 2014-05-12 MED ORDER — ADULT MULTIVITAMIN W/MINERALS CH
1.0000 | ORAL_TABLET | Freq: Every day | ORAL | Status: DC
  Administered 2014-05-13: 1 via ORAL
  Filled 2014-05-12: qty 1

## 2014-05-12 MED ORDER — CALCIUM CITRATE 950 (200 CA) MG PO TABS
1.0000 | ORAL_TABLET | Freq: Every day | ORAL | Status: DC
  Administered 2014-05-13: 200 mg via ORAL
  Filled 2014-05-12: qty 1

## 2014-05-12 MED ORDER — SODIUM CHLORIDE 0.9 % IV BOLUS (SEPSIS)
500.0000 mL | Freq: Once | INTRAVENOUS | Status: AC
  Administered 2014-05-12: 500 mL via INTRAVENOUS

## 2014-05-12 MED ORDER — THEOPHYLLINE ER 200 MG PO TB12
200.0000 mg | ORAL_TABLET | Freq: Every day | ORAL | Status: DC
  Administered 2014-05-13: 200 mg via ORAL
  Filled 2014-05-12: qty 1

## 2014-05-12 MED ORDER — POTASSIUM CHLORIDE CRYS ER 20 MEQ PO TBCR: 20.0000 meq | EXTENDED_RELEASE_TABLET | Freq: Two times a day (BID) | ORAL | Status: DC

## 2014-05-12 MED ORDER — METOPROLOL TARTRATE 25 MG PO TABS
25.0000 mg | ORAL_TABLET | Freq: Two times a day (BID) | ORAL | Status: DC
  Administered 2014-05-12 – 2014-05-13 (×2): 25 mg via ORAL
  Filled 2014-05-12 (×2): qty 1

## 2014-05-12 MED ORDER — TIOTROPIUM BROMIDE MONOHYDRATE 18 MCG IN CAPS
18.0000 ug | ORAL_CAPSULE | Freq: Every day | RESPIRATORY_TRACT | Status: DC
  Administered 2014-05-13: 18 ug via RESPIRATORY_TRACT
  Filled 2014-05-12: qty 5

## 2014-05-12 MED ORDER — HYDROCODONE-ACETAMINOPHEN 5-325 MG PO TABS
1.0000 | ORAL_TABLET | Freq: Three times a day (TID) | ORAL | Status: DC | PRN
Start: 2014-05-12 — End: 2014-05-13
  Administered 2014-05-12 (×2): 1 via ORAL
  Filled 2014-05-12 (×2): qty 1

## 2014-05-12 MED ORDER — METOCLOPRAMIDE HCL 5 MG/ML IJ SOLN
10.0000 mg | Freq: Once | INTRAMUSCULAR | Status: AC
  Administered 2014-05-12: 10 mg via INTRAVENOUS
  Filled 2014-05-12: qty 2

## 2014-05-12 MED ORDER — NITROGLYCERIN 0.4 MG SL SUBL: 0.4000 mg | SUBLINGUAL_TABLET | SUBLINGUAL | Status: DC | PRN

## 2014-05-12 MED ORDER — LATANOPROST 0.005 % OP SOLN
1.0000 [drp] | Freq: Every day | OPHTHALMIC | Status: DC
  Administered 2014-05-12: 1 [drp] via OPHTHALMIC
  Filled 2014-05-12: qty 2.5

## 2014-05-12 MED ORDER — ATORVASTATIN CALCIUM 20 MG PO TABS
20.0000 mg | ORAL_TABLET | Freq: Every evening | ORAL | Status: DC
  Administered 2014-05-12: 20 mg via ORAL
  Filled 2014-05-12 (×2): qty 1

## 2014-05-12 NOTE — ED Notes (Signed)
I gave I stat CG4 results to PA

## 2014-05-12 NOTE — ED Notes (Signed)
Pt denies dizziness with checking orthostatic.

## 2014-05-12 NOTE — ED Notes (Signed)
Pt unsuccessful attempt to void. Will attempt in 15 minutes.

## 2014-05-12 NOTE — ED Provider Notes (Signed)
CSN: [id number]     Arrival date & time 05/12/14  4270 History   First MD Initiated Contact with Patient 05/12/14 1005     Chief Complaint  Patient presents with  . Sore Throat  . Otalgia  . Hypotension     (Consider location/radiation/quality/duration/timing/severity/associated sxs/prior Treatment) HPI  Deborah Murray is a 54 y.o. female with PMH of asthma, hypertension, hyperlipidemia, thyroid disease presenting with 2 month history of hypertension as well as intermittent chest pain describes a choking sensation worsened with ambulation and associated shortness of breath. Patient states she had this this morning but it has since resolved. She has been seen and [facility] and had negative cardiac cath last month. Patient also with complaint of bilateral ear pain, sore throat, headache and dizziness described as lightheadedness that started today with ambulation. Patient is also had bilateral peripheral edema. She is not been taking her Lasix for the past 4 days due to the hypotension. She denies history of heart failure, stroke, TIA. She also had CT angiogram last month that was negative for PE.   Past Medical History  Diagnosis Date  . Asthma   . Hyperlipidemia   . Hypertension   . Syncope and collapse 05/11/11  adrenal insuffiency  treated at [facility]  . Thyroid disease    Past Surgical History  Procedure Laterality Date  . Cardiac catheterization  2012 at [facility]  . 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 Abdominal hysterectomy    . Cholecystectomy, laparoscopic    . Eye surg for glaucoma    . Right knee surg    . Cholecystectomy     Family History  Problem Relation Age of Onset  . Heart attack Mother   . Hypertension Mother   . Heart attack Father   . Asthma Father   . Hyperlipidemia Father   . Hypertension Father   . Heart attack Sister   . Arrhythmia Sister   . Asthma Sister   . Hyperlipidemia Sister   . Hypertension Sister   . Asthma Brother   .  Hypertension Brother    History  Substance Use Topics  . Smoking status: Never Smoker   . Smokeless tobacco: Never Used  . Alcohol Use: No   OB History    No data available     Review of Systems 10 Systems reviewed and are negative for acute change except as noted in the HPI.    Allergies  Aspirin; Codeine sulfate; Darvocet; and Penicillins  Home Medications   Prior to Admission medications   Medication Sig Start Date End Date Taking? Authorizing Provider  albuterol (PROVENTIL HFA;VENTOLIN HFA) 108 (90 BASE) MCG/ACT inhaler Inhale 2 puffs into the lungs every 4 (four) hours as needed for wheezing.    Yes Historical Provider, MD  albuterol (PROVENTIL) (2.5 MG/3ML) 0.083% nebulizer solution Take 2.5 mg by nebulization every 6 (six) hours as needed for wheezing or shortness of breath.   Yes Historical Provider, MD  aluminum chloride (DRYSOL) 20 % external solution Apply 1 application topically at bedtime as needed (for sweating).    Yes Historical Provider, MD  amLODipine (NORVASC) 5 MG tablet Take 5 mg by mouth daily.   Yes Historical Provider, MD  atorvastatin (LIPITOR) 20 MG tablet Take 20 mg by mouth every evening.    Yes Historical Provider, MD  beclomethasone (QVAR) 40 MCG/ACT inhaler Inhale 2 puffs into the lungs 2 (two) times daily.   Yes Historical Provider, MD  brimonidine (ALPHAGAN) 0.15 % ophthalmic solution Place 1 drop into both eyes 2 (two) times daily.    Yes Historical Provider, MD  calcium citrate (CALCITRATE - DOSED IN MG ELEMENTAL CALCIUM) 950 MG tablet Take 1 tablet by mouth daily.   Yes Historical Provider, MD  citalopram (CELEXA) 20 MG tablet Take 20 mg by mouth at bedtime.    Yes Historical Provider, MD  diclofenac sodium (VOLTAREN) 1 % GEL Apply 2 g topically 2 (two) times daily as needed (for arthritis).   Yes Historical Provider, MD  docusate sodium (COLACE) 100 MG capsule Take 100 mg by mouth daily as needed for mild constipation.   Yes Historical Provider,  MD  esomeprazole (NEXIUM) 40 MG capsule Take 40 mg by mouth daily before breakfast.   Yes Historical Provider, MD  fexofenadine (ALLEGRA) 180 MG tablet Take 180 mg by mouth daily.   Yes Historical Provider, MD  fluticasone (FLONASE) 50 MCG/ACT nasal spray Place 2 sprays into the nose daily as needed for allergies.    Yes Historical Provider, MD  gabapentin (NEURONTIN) 300 MG capsule Take 600 mg by mouth at bedtime.    Yes Historical Provider, MD  hydrochlorothiazide (HYDRODIURIL) 25 MG tablet Take 25 mg by mouth daily with breakfast.    Yes Historical Provider, MD  HYDROcodone-acetaminophen (NORCO/VICODIN) 5-325 MG per tablet Take 1 tablet by mouth every 4 (four) hours as needed for pain. Patient taking differently: Take 1 tablet by mouth every 8 (eight) hours as needed (for pain).  11/26/12  Yes Almyra Free Idol, PA-C  latanoprost (XALATAN) 0.005 % ophthalmic solution Place 1 drop into both eyes at bedtime.   Yes Historical Provider, MD  levocetirizine (XYZAL) 5 MG tablet Take 5 mg by mouth daily.   Yes Historical Provider, MD  lisinopril (PRINIVIL,ZESTRIL) 20 MG tablet Take 20 mg by mouth daily.   Yes Historical Provider, MD  methimazole (TAPAZOLE) 5 MG tablet Take 5 mg by mouth at bedtime.    Yes Historical Provider, MD  montelukast (SINGULAIR) 10 MG tablet Take 10 mg by mouth at bedtime.   Yes Historical Provider, MD  Multiple Vitamin (MULTIVITAMIN) capsule Take 1 capsule by mouth daily.   Yes Historical Provider, MD  nitroGLYCERIN (NITROSTAT) 0.4 MG SL tablet Place 0.4 mg under the tongue every 5 (five) minutes as needed for chest pain.   Yes Historical Provider, MD  ondansetron (ZOFRAN-ODT) 4 MG disintegrating tablet Take 4 mg by mouth every 8 (eight) hours as needed for nausea.   Yes Historical Provider, MD  potassium chloride SA (K-DUR,KLOR-CON) 20 MEQ tablet Take 20 mEq by mouth 2 (two) times daily.   Yes Historical Provider, MD  predniSONE (DELTASONE) 5 MG tablet Take 35 mg by mouth daily with  breakfast.    Yes Historical Provider, MD  promethazine (PHENERGAN) 25 MG tablet Take 12.5 mg by mouth every 6 (six) hours as needed for nausea.   Yes Historical Provider, MD  theophylline (THEO-24) 100 MG 24 hr capsule Take 200-300 mg by mouth 2 (two) times daily. Takes 200mg  in the morning and 300mg  in the evening   Yes Historical Provider, MD  tiotropium (SPIRIVA) 18 MCG inhalation capsule Place 18 mcg into inhaler and inhale daily.   Yes Historical Provider, MD  Vitamin D, Ergocalciferol, (DRISDOL) 50000 UNITS CAPS capsule Take 50,000 Units by mouth every Sunday.    Yes Historical Provider, MD   BP 108/81 mmHg  Pulse 83  Temp(Src) 97.8 F (36.6 C) (Oral)  Resp 18  Ht 5'  1" (1.549 m)  Wt 258 lb 8 oz (117.255 kg)  BMI 48.87 kg/m2  SpO2 99% Physical Exam  Constitutional: She appears well-developed and well-nourished. No distress.  HENT:  Head: Normocephalic and atraumatic.  Right Ear: Tympanic membrane normal.  Left Ear: Tympanic membrane normal.  Mouth/Throat: Oropharynx is clear and moist.  Eyes: Conjunctivae and EOM are normal. Right eye exhibits no discharge. Left eye exhibits no discharge.  Neck: No JVD present.  Cardiovascular: Normal rate, regular rhythm and normal heart sounds.   1+ pitting edema bilaterally to knees.  Pulmonary/Chest: Effort normal and breath sounds normal. No respiratory distress.  Diminished air movement diffusely  Abdominal: Soft. Bowel sounds are normal. She exhibits no distension. There is no tenderness.  Neurological: She is alert. She exhibits normal muscle tone. Coordination normal.  Skin: Skin is warm and dry. She is not diaphoretic.  Nursing note and vitals reviewed.   ED Course  Procedures (including critical care time) Labs Review Labs Reviewed  CBC WITH DIFFERENTIAL/PLATELET - Abnormal; Notable for the following:    Neutrophils Relative % 79 (*)    All other components within normal limits  COMPREHENSIVE METABOLIC PANEL - Abnormal;  Notable for the following:    Potassium 2.9 (*)    Creatinine, Ser 1.20 (*)    Total Protein 5.9 (*)    Albumin 3.3 (*)    GFR calc non Af Amer 50 (*)    GFR calc Af Amer 58 (*)    All other components within normal limits  I-STAT CG4 LACTIC ACID, ED - Abnormal; Notable for the following:    Lactic Acid, Venous 3.48 (*)    All other components within normal limits  URINALYSIS, ROUTINE W REFLEX MICROSCOPIC  PROTIME-INR  BRAIN NATRIURETIC PEPTIDE  TSH  I-STAT TROPOININ, ED    Imaging Review Dg Chest 2 View  05/12/2014   CLINICAL DATA:  Shortness of breath.  EXAM: CHEST  2 VIEW  COMPARISON:  November 26, 2012.  FINDINGS: The heart size and mediastinal contours are within normal limits. No pneumothorax or pleural effusion is noted. Right lung is clear. Stable linear density is noted in left lung base consistent with scarring. No acute pulmonary disease is noted. The visualized skeletal structures are unremarkable.  IMPRESSION: No acute cardiopulmonary abnormality seen.   Electronically Signed   By: Sabino Dick M.D.   On: 05/12/2014 11:32     EKG Interpretation   Date/Time:  Saturday May 12 2014 10:14:07 EST Ventricular Rate:  157 PR Interval:    QRS Duration: 86 QT Interval:  321 QTC Calculation: 519 R Axis:   4 Text Interpretation:  Atrial fibrillation Repolarization abnormality, prob  rate related Prolonged QT interval Baseline wander in lead(s) II III aVF  Confirmed by COOK  MD, BRIAN ([id number]) on 05/12/2014 10:24:05 AM      MDM   Final diagnoses:  Atrial fibrillation, unspecified  Hypokalemia  Hypotension, unspecified hypotension type   Patient with new onset atrial fibrillation with RVR. She is currently asymptomatic. No chest pain or shortness of breath. She has had hypotension has been given 500 bolus as well as Cardizem drip with improvement of her heart rate as well as becoming normotensive. Negative troponin and BNP. Patient not anemic and found to have hypokalemia  to 2.9. Patient given oral potassium. Chest x-ray without cardiomegaly. Patient with CHADVAS score of 2 with moderate to high risk for anticoagulation. Discussed case with Dr. Nadyne Coombes. He recommended discontinuing IV cardizem and starting oral Cardizem CD  240mg  daily and metoprolol twice daily and starting xarelto. Patient also with complaint of headache as well as sore throat and ear pain. Unclear etiology. Patient currently with improvement of her symptoms and no headache. Consult to hospitalist. Spoke with Dr. Algis Liming who agrees to evaluate the pt in the ED with plan for admission to telemetry bed.  Discussed all results and patient verbalizes understanding and agrees with plan.  This is a shared patient. This patient was discussed with the physician who saw and evaluated the patient and agrees with the plan.   Pura Spice, PA-C 05/12/14 [place], MD 05/15/14 [phone]

## 2014-05-12 NOTE — ED Notes (Signed)
Pt transported to DG at present time.

## 2014-05-12 NOTE — Consult Note (Addendum)
CARDIOLOGY CONSULT NOTE  Patient ID: Deborah Murray MRN: [id number] DOB/AGE: 01-02-1961 54 y.o.  Admit date: 05/12/2014 Referring Physician  Vernell Leep, MD Primary Physician:  Sol Passer, MD Reason for Consultation  A. Fibrillation with RVR  HPI: Patient is a 54 old African-American female with history of morbid obesity, bronchial asthma, hyperlipidemia, hypertension, history of bariatric surgery, gastric bypass in the past, had lost about 100 pounds, has gained most of the weight, admitted to the hospital with atrial fibrillation with rapid ventricular response. Patient was seen by her PCP just about 4-5 days ago when she said that she has not been feeling well and has been having shortness of breath. This is a chronic issue.  She again presented today to the emergency room for shortness of breath and feeling soreness in her throat. Incidentally was found to be in atrial fibrillation with rapid ventricular response, patient now admitted to the hospital. She has had coronary angiography on 04/10/2014 at [facility], normal coronary arteries.  Otherwise patient asymptomatic, no chest pain, no palpitation, no dizziness or syncope.  Patient history is also significant for hyperthyroidism, patient is presently on methimazole therapy.  Past Medical History  Diagnosis Date  . Asthma   . Hyperlipidemia   . Hypertension   . Syncope and collapse 05/11/11  adrenal insuffiency  treated at [facility]  . Thyroid disease      Past Surgical History  Procedure Laterality Date  . Cardiac catheterization  2012 at [facility]  . 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 Abdominal hysterectomy    . Cholecystectomy, laparoscopic    . Eye surg for glaucoma    . Right knee surg    . Cholecystectomy       Family History  Problem Relation Age of Onset  . Heart attack Mother   . Hypertension Mother   . Heart attack Father   . Asthma Father   . Hyperlipidemia Father   .  Hypertension Father   . Heart attack Sister   . Arrhythmia Sister   . Asthma Sister   . Hyperlipidemia Sister   . Hypertension Sister   . Asthma Brother   . Hypertension Brother      Social History: History   Social History  . Marital Status: Single    Spouse Name: N/A    Number of Children: N/A  . Years of Education: N/A   Occupational History  . Not on file.   Social History Main Topics  . Smoking status: Never Smoker   . Smokeless tobacco: Never Used  . Alcohol Use: No  . Drug Use: No  . Sexual Activity: No   Other Topics Concern  . Not on file   Social History Narrative     Prescriptions prior to admission  Medication Sig Dispense Refill Last Dose  . albuterol (PROVENTIL HFA;VENTOLIN HFA) 108 (90 BASE) MCG/ACT inhaler Inhale 2 puffs into the lungs every 4 (four) hours as needed for wheezing.    05/12/2014 at Unknown time  . albuterol (PROVENTIL) (2.5 MG/3ML) 0.083% nebulizer solution Take 2.5 mg by nebulization every 6 (six) hours as needed for wheezing or shortness of breath.   05/12/2014 at 0800  . aluminum chloride (DRYSOL) 20 % external solution Apply 1 application topically at bedtime as needed (for sweating).    Past Week at Unknown time  . amLODipine (NORVASC) 5 MG tablet Take 5 mg by mouth daily.   Past Week at  Unknown time  . atorvastatin (LIPITOR) 20 MG tablet Take 20 mg by mouth every evening.    05/11/2014 at Unknown time  . beclomethasone (QVAR) 40 MCG/ACT inhaler Inhale 2 puffs into the lungs 2 (two) times daily.   05/12/2014 at Unknown time  . brimonidine (ALPHAGAN) 0.15 % ophthalmic solution Place 1 drop into both eyes 2 (two) times daily.    05/12/2014 at 0800  . calcium citrate (CALCITRATE - DOSED IN MG ELEMENTAL CALCIUM) 950 MG tablet Take 1 tablet by mouth daily.   05/11/2014 at Unknown time  . citalopram (CELEXA) 20 MG tablet Take 20 mg by mouth at bedtime.    05/11/2014 at Unknown time  . diclofenac sodium (VOLTAREN) 1 % GEL Apply 2 g topically 2 (two)  times daily as needed (for arthritis).   Past Week at Unknown time  . docusate sodium (COLACE) 100 MG capsule Take 100 mg by mouth daily as needed for mild constipation.   Past Week at Unknown time  . esomeprazole (NEXIUM) 40 MG capsule Take 40 mg by mouth daily before breakfast.   05/12/2014 at Unknown time  . fexofenadine (ALLEGRA) 180 MG tablet Take 180 mg by mouth daily.   Past Week at Unknown time  . fluticasone (FLONASE) 50 MCG/ACT nasal spray Place 2 sprays into the nose daily as needed for allergies.    Past Week at Unknown time  . gabapentin (NEURONTIN) 300 MG capsule Take 600 mg by mouth at bedtime.    Past Week at Unknown time  . hydrochlorothiazide (HYDRODIURIL) 25 MG tablet Take 25 mg by mouth daily with breakfast.    Past Week at Unknown time  . HYDROcodone-acetaminophen (NORCO/VICODIN) 5-325 MG per tablet Take 1 tablet by mouth every 4 (four) hours as needed for pain. (Patient taking differently: Take 1 tablet by mouth every 8 (eight) hours as needed (for pain). ) 20 tablet 0 Past Week at Unknown time  . latanoprost (XALATAN) 0.005 % ophthalmic solution Place 1 drop into both eyes at bedtime.   05/11/2014 at Unknown time  . levocetirizine (XYZAL) 5 MG tablet Take 5 mg by mouth daily.   Past Week at Unknown time  . lisinopril (PRINIVIL,ZESTRIL) 20 MG tablet Take 20 mg by mouth daily.   Past Week at Unknown time  . methimazole (TAPAZOLE) 5 MG tablet Take 5 mg by mouth at bedtime.    05/11/2014 at Unknown time  . montelukast (SINGULAIR) 10 MG tablet Take 10 mg by mouth at bedtime.   Past Week at Unknown time  . Multiple Vitamin (MULTIVITAMIN) capsule Take 1 capsule by mouth daily.   05/12/2014 at Unknown time  . nitroGLYCERIN (NITROSTAT) 0.4 MG SL tablet Place 0.4 mg under the tongue every 5 (five) minutes as needed for chest pain.   unknown  . ondansetron (ZOFRAN-ODT) 4 MG disintegrating tablet Take 4 mg by mouth every 8 (eight) hours as needed for nausea.   05/11/2014 at Unknown time  .  potassium chloride SA (K-DUR,KLOR-CON) 20 MEQ tablet Take 20 mEq by mouth 2 (two) times daily.   Past Week at Unknown time  . predniSONE (DELTASONE) 5 MG tablet Take 35 mg by mouth daily with breakfast.    05/12/2014 at 0800  . promethazine (PHENERGAN) 25 MG tablet Take 12.5 mg by mouth every 6 (six) hours as needed for nausea.   Past Week at Unknown time  . theophylline (THEO-24) 100 MG 24 hr capsule Take 200-300 mg by mouth 2 (two) times daily. Takes 200mg  in  the morning and 300mg  in the evening   05/12/2014 at Unknown time  . tiotropium (SPIRIVA) 18 MCG inhalation capsule Place 18 mcg into inhaler and inhale daily.   05/12/2014 at Unknown time  . Vitamin D, Ergocalciferol, (DRISDOL) 50000 UNITS CAPS capsule Take 50,000 Units by mouth every Sunday.    Past Week at Unknown time    Scheduled Meds: Continuous Infusions: PRN Meds:.  ROS: General: no fevers/chills/night sweats Eyes: no blurry vision, diplopia, or amaurosis ENT: no sore throat or hearing loss Resp: wheezing, cough present  CV: no edema or palpitations GI: no abdominal pain, nausea, vomiting, diarrhea, or constipation GU: no dysuria, frequency, or hematuria Skin: no rash Neuro: Complains of headache, denies any limb numbness, tingling, or weakness of extremities Musculoskeletal: no joint pain or swelling Heme: no bleeding, DVT, or easy bruising Endo: no polydipsia or polyuria    Physical Exam: Blood pressure 121/85, pulse 105, temperature 97.5 F (36.4 C), temperature source Oral, resp. rate 18, height 5\' 1"  (1.549 m), weight 115.214 kg (254 lb), SpO2 96 %.   General appearance: alert, cooperative, appears stated age and moderately obese Lungs: clear to auscultation bilaterally Heart: S1: vaariable intensity, S2: normal, no S3 or S4, no click and No murmur Abdomen: soft, non-tender; bowel sounds normal; no masses,  no organomegaly and pannus present Extremities: extremities normal, atraumatic, no cyanosis or edema Pulses:  2+ and symmetric Neurologic: Grossly normal  Labs:   Lab Results  Component Value Date   WBC 7.6 05/12/2014   HGB 14.0 05/12/2014   HCT 44.7 05/12/2014   MCV 92.9 05/12/2014   PLT 259 05/12/2014    Recent Labs Lab 05/12/14 1023  NA 145  K 2.9*  CL 111  CO2 26  BUN 17  CREATININE 1.20*  CALCIUM 8.7  PROT 5.9*  BILITOT 0.4  ALKPHOS 61  ALT 19  AST 36  GLUCOSE 75   No results found for: CKTOTAL, CKMB, CKMBINDEX, TROPONINI  Lipid Panel  No results found for: CHOL, TRIG, HDL, CHOLHDL, VLDL, LDLCALC  EKG: Atrial fibrillation with rapid ventricular response. Normal axis. No evidence of ischemia in spite of rapid ventricular response.    Radiology: Dg Chest 2 View  05/12/2014   CLINICAL DATA:  Shortness of breath.  EXAM: CHEST  2 VIEW  COMPARISON:  November 26, 2012.  FINDINGS: The heart size and mediastinal contours are within normal limits. No pneumothorax or pleural effusion is noted. Right lung is clear. Stable linear density is noted in left lung base consistent with scarring. No acute pulmonary disease is noted. The visualized skeletal structures are unremarkable.  IMPRESSION: No acute cardiopulmonary abnormality seen.   Electronically Signed   By: Sabino Dick M.D.   On: 05/12/2014 11:32   ASSESSMENT AND PLAN:  1. New onset atrial fibrillation with Ventricular response. Normal coronary arteries by coronary angiography December 2015 CHA2DS2-VASCScore: Risk Score 2,  Yearly risk of stroke  2.2. Recommendation: Anticoagulation Yes  [place] Has Bled: Score 0.9. Estimated bleeding risk: 1.  at 1 year with [place]  2. Obesity, moderate, history of gastric bypass surgery in the past 3. Hyperthyroidism, on chronic methimazole therapy. 4. Hypokalemia 5. Hypertension 6. Hyperlipidemia 7. Obstructive sleep apnea on CPAP and compliant  Recommendation: I have already discussed with the primary team, patient will be started on by mouth Cardizem and low dose of beta blocker combination.  Patient essentially asymptomatic with regard to atrial fibrillation, would recommend continued medical therapy, can be discharged home safely from cardiac  standpoint, her  lites will be corrected today, TSH is pending, I will see her back in the office for management of atrial fibrillation. Patient will be discharged home on Xarelto for now. Consideration for radioactive iodine ablation of  hyperthyroidism should be considered due to long-term side effects of methimazole.  Laverda Page, MD 05/12/2014, 3:10 PM [facility] [place] Pager: [phone] Office: [phone] If no answer Cell [phone]

## 2014-05-12 NOTE — H&P (Addendum)
History and Physical  Deborah Murray IDP:[id number] DOB: 08-18-1960 DOA: 05/12/2014  Referring physician: Al Corpus, ED PA-C PCP: Sol Passer, MD at [facility]. Outpatient Specialists:  1. Cardiologist: at [facility].  Chief Complaint: Headache, earache and throat pain.  HPI: Deborah Murray is a 54 y.o. female with history of HTN, HLD, severe persistent asthma, toxic multinodular goiter, OSA on nightly C Pap, vitamin D deficiency, osteoporosis recent negative cardiac cath and CTA chest at [facility], presented to the ED with above complaints. Patient lives in [place]. She states that since he was a great day to be outside, she decided to come [place] the friendly [place] to shop. She went to a local [employer] and had bacon, cheese sandwich at approximately 10 AM. Apart from her chronic dyspnea, she denied any complaints until then. About 20 minutes after eating the sandwich, she started experiencing severe headache, bilateral earache and throat pain. She felt that her throat was? Closing up on her. She denied skin rash, lip or tongue swelling. She decided to come to the emergency department. Her symptoms however had spontaneously started improving after 1-2 hours. She currently denies any throat pain or earache or difficulty swallowing. At no point did she experience palpitations, chest pain, dizziness or lightheadedness. No recent long-distance travel. In the ED, noted to be in A. fib with RVR and potassium of 2.9. Cardiologist was consulted. Hospitalist admission requested.   Review of Systems: All systems reviewed and apart from history of presenting illness, are negative.  Past Medical History  Diagnosis Date  . Asthma   . Hyperlipidemia   . Hypertension   . Syncope and collapse 05/11/11  adrenal insuffiency  treated at [facility]  . Thyroid disease    Past Surgical History  Procedure Laterality Date  . Cardiac catheterization  2012 at [facility]  . Gastic by-pass   05-11-11    Gastric by-pass in 2008  . Abdominal hysterectomy    . Cholecystectomy, laparoscopic    . Eye surg for glaucoma    . Right knee surg    . Cholecystectomy     Social History:  reports that she has never smoked. She has never used smokeless tobacco. She reports that she does not drink alcohol or use illicit drugs. Single. Lives alone. Independent of activities of daily living. Uses nightly C Pap.  Allergies  Allergen Reactions  . Aspirin Shortness Of Breath and Palpitations  . Codeine Sulfate Shortness Of Breath and Palpitations  . Darvocet [Propoxyphene N-Acetaminophen] Shortness Of Breath and Palpitations  . Penicillins Other (See Comments)    Childhood allergy-Reaction is unknown    Family History  Problem Relation Age of Onset  . Heart attack Mother   . Hypertension Mother   . Heart attack Father   . Asthma Father   . Hyperlipidemia Father   . Hypertension Father   . Heart attack Sister   . Arrhythmia Sister   . Asthma Sister   . Hyperlipidemia Sister   . Hypertension Sister   . Asthma Brother   . Hypertension Brother     Prior to Admission medications   Medication Sig Start Date End Date Taking? Authorizing Provider  albuterol (PROVENTIL HFA;VENTOLIN HFA) 108 (90 BASE) MCG/ACT inhaler Inhale 2 puffs into the lungs every 4 (four) hours as needed for wheezing.    Yes Historical Provider, MD  albuterol (PROVENTIL) (2.5 MG/3ML) 0.083% nebulizer solution Take 2.5 mg by nebulization every 6 (six) hours as needed  for wheezing or shortness of breath.   Yes Historical Provider, MD  aluminum chloride (DRYSOL) 20 % external solution Apply 1 application topically at bedtime as needed (for sweating).    Yes Historical Provider, MD  amLODipine (NORVASC) 5 MG tablet Take 5 mg by mouth daily.   Yes Historical Provider, MD  atorvastatin (LIPITOR) 20 MG tablet Take 20 mg by mouth every evening.    Yes Historical Provider, MD  beclomethasone (QVAR) 40 MCG/ACT inhaler Inhale 2  puffs into the lungs 2 (two) times daily.   Yes Historical Provider, MD  brimonidine (ALPHAGAN) 0.15 % ophthalmic solution Place 1 drop into both eyes 2 (two) times daily.    Yes Historical Provider, MD  calcium citrate (CALCITRATE - DOSED IN MG ELEMENTAL CALCIUM) 950 MG tablet Take 1 tablet by mouth daily.   Yes Historical Provider, MD  citalopram (CELEXA) 20 MG tablet Take 20 mg by mouth at bedtime.    Yes Historical Provider, MD  diclofenac sodium (VOLTAREN) 1 % GEL Apply 2 g topically 2 (two) times daily as needed (for arthritis).   Yes Historical Provider, MD  docusate sodium (COLACE) 100 MG capsule Take 100 mg by mouth daily as needed for mild constipation.   Yes Historical Provider, MD  esomeprazole (NEXIUM) 40 MG capsule Take 40 mg by mouth daily before breakfast.   Yes Historical Provider, MD  fexofenadine (ALLEGRA) 180 MG tablet Take 180 mg by mouth daily.   Yes Historical Provider, MD  fluticasone (FLONASE) 50 MCG/ACT nasal spray Place 2 sprays into the nose daily as needed for allergies.    Yes Historical Provider, MD  gabapentin (NEURONTIN) 300 MG capsule Take 600 mg by mouth at bedtime.    Yes Historical Provider, MD  hydrochlorothiazide (HYDRODIURIL) 25 MG tablet Take 25 mg by mouth daily with breakfast.    Yes Historical Provider, MD  HYDROcodone-acetaminophen (NORCO/VICODIN) 5-325 MG per tablet Take 1 tablet by mouth every 4 (four) hours as needed for pain. Patient taking differently: Take 1 tablet by mouth every 8 (eight) hours as needed (for pain).  11/26/12  Yes Almyra Free Idol, PA-C  latanoprost (XALATAN) 0.005 % ophthalmic solution Place 1 drop into both eyes at bedtime.   Yes Historical Provider, MD  levocetirizine (XYZAL) 5 MG tablet Take 5 mg by mouth daily.   Yes Historical Provider, MD  lisinopril (PRINIVIL,ZESTRIL) 20 MG tablet Take 20 mg by mouth daily.   Yes Historical Provider, MD  methimazole (TAPAZOLE) 5 MG tablet Take 5 mg by mouth at bedtime.    Yes Historical Provider,  MD  montelukast (SINGULAIR) 10 MG tablet Take 10 mg by mouth at bedtime.   Yes Historical Provider, MD  Multiple Vitamin (MULTIVITAMIN) capsule Take 1 capsule by mouth daily.   Yes Historical Provider, MD  nitroGLYCERIN (NITROSTAT) 0.4 MG SL tablet Place 0.4 mg under the tongue every 5 (five) minutes as needed for chest pain.   Yes Historical Provider, MD  ondansetron (ZOFRAN-ODT) 4 MG disintegrating tablet Take 4 mg by mouth every 8 (eight) hours as needed for nausea.   Yes Historical Provider, MD  potassium chloride SA (K-DUR,KLOR-CON) 20 MEQ tablet Take 20 mEq by mouth 2 (two) times daily.   Yes Historical Provider, MD  predniSONE (DELTASONE) 5 MG tablet Take 35 mg by mouth daily with breakfast.    Yes Historical Provider, MD  promethazine (PHENERGAN) 25 MG tablet Take 12.5 mg by mouth every 6 (six) hours as needed for nausea.   Yes Historical Provider, MD  theophylline (THEO-24) 100 MG 24 hr capsule Take 200-300 mg by mouth 2 (two) times daily. Takes 200mg  in the morning and 300mg  in the evening   Yes Historical Provider, MD  tiotropium (SPIRIVA) 18 MCG inhalation capsule Place 18 mcg into inhaler and inhale daily.   Yes Historical Provider, MD  Vitamin D, Ergocalciferol, (DRISDOL) 50000 UNITS CAPS capsule Take 50,000 Units by mouth every Sunday.    Yes Historical Provider, MD   Physical Exam: Filed Vitals:   05/12/14 1315 05/12/14 1330 05/12/14 1345 05/12/14 1400  BP: 110/79 103/76 111/61 128/83  Pulse:      Temp:      TempSrc:      Resp: 23 14 19 17   Height:      Weight:      SpO2: 100% 100% 99% 97%   temperature 97.45F. Pulse in the 110s.   General exam: Moderately built and obese female patient, lying comfortably supine on the gurney in no obvious distress.  Head, eyes and ENT: Nontraumatic and normocephalic. Pupils equally reacting to light and accommodation. Oral mucosa moist. Throat exam without acute findings. Otoscopy: Right ear unremarkable. Left auditory canal with some  cerumen and unable to clearly visualize, but seems unremarkable too.  Neck: Supple. No JVD, carotid bruit or thyromegaly.  Lymphatics: No lymphadenopathy.  Respiratory system: Occasional basal crackles but otherwise clear to auscultation. No increased work of breathing.  Cardiovascular system: S1 and S2 heard, RRR. No JVD, murmurs, gallops, clicks. Trace bilateral ankle edema.  Gastrointestinal system: Abdomen is nondistended, soft and nontender. Normal bowel sounds heard. No organomegaly or masses appreciated.  Central nervous system: Alert and oriented. No focal neurological deficits.  Extremities: Symmetric 5 x 5 power. Peripheral pulses symmetrically felt.   Skin: No rashes or acute findings.  Musculoskeletal system: Negative exam.  Psychiatry: Pleasant and cooperative.   Labs on Admission:  Basic Metabolic Panel:  Recent Labs Lab 05/12/14 1023  NA 145  K 2.9*  CL 111  CO2 26  GLUCOSE 75  BUN 17  CREATININE 1.20*  CALCIUM 8.7   Liver Function Tests:  Recent Labs Lab 05/12/14 1023  AST 36  ALT 19  ALKPHOS 61  BILITOT 0.4  PROT 5.9*  ALBUMIN 3.3*   No results for input(s): LIPASE, AMYLASE in the last 168 hours. No results for input(s): AMMONIA in the last 168 hours. CBC:  Recent Labs Lab 05/12/14 1023  WBC 7.6  NEUTROABS 6.0  HGB 14.0  HCT 44.7  MCV 92.9  PLT 259   Cardiac Enzymes: No results for input(s): CKTOTAL, CKMB, CKMBINDEX, TROPONINI in the last 168 hours.  BNP (last 3 results) No results for input(s): PROBNP in the last 8760 hours. CBG: No results for input(s): GLUCAP in the last 168 hours.  Radiological Exams on Admission: Dg Chest 2 View  05/12/2014   CLINICAL DATA:  Shortness of breath.  EXAM: CHEST  2 VIEW  COMPARISON:  November 26, 2012.  FINDINGS: The heart size and mediastinal contours are within normal limits. No pneumothorax or pleural effusion is noted. Right lung is clear. Stable linear density is noted in left lung base  consistent with scarring. No acute pulmonary disease is noted. The visualized skeletal structures are unremarkable.  IMPRESSION: No acute cardiopulmonary abnormality seen.   Electronically Signed   By: Sabino Dick M.D.   On: 05/12/2014 11:32    EKG: Independently reviewed. A. fib with RVR, ventricular rate 157/m, rate related ST-T changes but otherwise no acute findings.  Assessment/Plan  Principal Problem:   Atrial fibrillation with RVR Active Problems:   Hypokalemia   Essential hypertension   Atrial fibrillation   1. New A. fib with RVR: Patient asymptomatic of palpitations so not sure if she has underlying PAF. Precipitants may be underlying chronic lung disease-asthma/COPD and OSA. Euthyroid clinically and by labs. Admit to telemetry. Cardiology consulted by EDP and recommends discontinuing Cardizem drip, starting Cardizem CD 240 mg daily, metoprolol 25 twice a day and Xarelto. Cardiac Cath 04/09/14: No angiographically apparent CAD, borderline elevated left ventricular pressures. Check TSH. May need to consider decreasing or stopping theophylline. 2. Hypokalemia: Replace and follow BMP. 3. Essential hypertension: Controlled. Blood pressures are soft. Since patient being newly started on Cardizem CD and metoprolol, will hold amlodipine and HCTZ. Has not taken HCTZ or lisinopril since last week-hold 4. Severe persistent asthma: Continue home medications. Her PCP is concerned regarding VCD-awaiting evaluation by ENT. Follows with pulmonology. Slow prednisone taper now at 35 mg daily. 5. OSA: Continue nightly C Pap 6. Morbid obesity 7. Toxic multinodular goiter: Continue methimazole. TSH 05/09/14:2.71 and free T4 0.78 8. Headache, earache and throat pain: Temporal relationship to breakfast sandwich that she had this morning.? Food allergy. Seems to be resolving spontaneously. Monitor. Patient stated she did not eat anything unusual.     Code Status: Full  Family Communication: None at  bedside  Disposition Plan: Home when medically stable   Time spent: 65 minutes.  Vernell Leep, MD, FACP, FHM. [facility] Pager [phone]  If 7PM-7AM, please contact night-coverage [email or web address] Password [email or web address] 05/12/2014, 2:11 PM

## 2014-05-12 NOTE — Progress Notes (Signed)
ANTICOAGULATION CONSULT NOTE - Initial Consult  Pharmacy Consult for rivaroxaban Indication: atrial fibrillation  Allergies  Allergen Reactions  . Aspirin Shortness Of Breath and Palpitations  . Codeine Sulfate Shortness Of Breath and Palpitations  . Darvocet [Propoxyphene N-Acetaminophen] Shortness Of Breath and Palpitations  . Penicillins Other (See Comments)    Childhood allergy-Reaction is unknown    Patient Measurements: Height: 5\' 1"  (154.9 cm) Weight: 258 lb 8 oz (117.255 kg) IBW/kg (Calculated) : 47.8 Adjusted Body Weight: 75.6kg  Vital Signs: Temp: 97.8 F (36.6 C) (01/30 1517) Temp Source: Oral (01/30 1517) BP: 108/81 mmHg (01/30 1517) Pulse Rate: 83 (01/30 1517)  Labs:  Recent Labs  05/12/14 1023  HGB 14.0  HCT 44.7  PLT 259  LABPROT 12.1  INR 0.89  CREATININE 1.20*    Estimated Creatinine Clearance: 63.2 mL/min (by C-G formula based on Cr of 1.2).   Medical History: Past Medical History  Diagnosis Date  . Asthma   . Hyperlipidemia   . Hypertension   . Syncope and collapse 05/11/11  adrenal insuffiency  treated at [facility]  . Thyroid disease     Medications:  Scheduled:  . atorvastatin  20 mg Oral QPM  . brimonidine  1 drop Both Eyes BID  . [START ON 05/13/2014] calcium citrate  1 tablet Oral Daily  . citalopram  20 mg Oral QHS  . [START ON 05/13/2014] diltiazem  240 mg Oral Daily  . fluticasone  2 puff Inhalation BID  . latanoprost  1 drop Both Eyes QHS  . methimazole  5 mg Oral QHS  . metoprolol tartrate  25 mg Oral BID  . montelukast  10 mg Oral QHS  . multivitamin  1 capsule Oral Daily  . [START ON 05/13/2014] pantoprazole  40 mg Oral Daily  . [START ON 05/13/2014] potassium chloride SA  20 mEq Oral BID  . potassium chloride SA  40 mEq Oral Once  . [START ON 05/13/2014] predniSONE  35 mg Oral Q breakfast  . sodium chloride  3 mL Intravenous Q12H  . theophylline  300 mg Oral QHS  . [START ON 05/13/2014] tiotropium  18 mcg Inhalation Daily   . [START ON 05/13/2014] Vitamin D (Ergocalciferol)  50,000 Units Oral Q Sun    Assessment: 53yoF admitted 1/30 with multiple complaints including dizziness. Pt reported she had not taken her BP meds in 4 days PTA. Pt found to be in new-onset atrial fibrillation with rapid ventricular rate. Cardizem drip has been initiated and Pharmacy is consulted to dose rivaroxaban for stroke prevention.   CBC is WNL  SCr 1.2 (baseline unknown), CrCl 63 ml/min  Drug interactions:  Citalopram may increase risk of bleeding due to antiplatelet properties  Goal of Therapy:  rivaroxaban per indication and renal function Monitor platelets by anticoagulation protocol: Yes   Plan:  - noted rivaroxaban 20mg  PO x 1 given today in the ED - start rivaroxaban 20mg  daily with largest meal of the day (supper) on 1/31 - bmet and CBC in the AM - Pharmacy will follow up daily  Thank you for the consult.  Currie Paris, PharmD, BCPS Pager: [phone] Pharmacy: [phone] 05/12/2014 4:08 PM

## 2014-05-12 NOTE — ED Notes (Addendum)
Patient states she woke this AM with sore throat and bilateral ear pain. Patient states she is currently taking meds for sinus. Patient c/o dizziness and reports she has not taken her BP meds in 4 days.

## 2014-05-12 NOTE — ED Notes (Signed)
Cardizem infusion still running continuously

## 2014-05-12 NOTE — ED Notes (Addendum)
Pt reports seen for 2 months at [facility] for hypotension. Pt reports came here today for bilateral ear pain, throat pain, and headache. Pt denies SOB or difficulty swallowing. At present time, pt new onset a fib.

## 2014-05-13 LAB — BASIC METABOLIC PANEL
Anion gap: 7 (ref 5–15)
BUN: 20 mg/dL (ref 6–23)
CALCIUM: 8.4 mg/dL (ref 8.4–10.5)
CHLORIDE: 112 mmol/L (ref 96–112)
CO2: 26 mmol/L (ref 19–32)
Creatinine, Ser: 1.08 mg/dL (ref 0.50–1.10)
GFR calc Af Amer: 66 mL/min — ABNORMAL LOW (ref >90)
GFR, EST NON AFRICAN AMERICAN: 57 mL/min — AB (ref >90)
Glucose, Bld: 79 mg/dL (ref 70–99)
Potassium: 3.6 mmol/L (ref 3.5–5.1)
SODIUM: 145 mmol/L (ref 135–145)

## 2014-05-13 LAB — CBC
HCT: 43.5 % (ref 36.0–46.0)
Hemoglobin: 13.5 g/dL (ref 12.0–15.0)
MCH: 28.7 pg (ref 26.0–34.0)
MCHC: 31 g/dL (ref 30.0–36.0)
MCV: 92.4 fL (ref 78.0–100.0)
PLATELETS: 252 10*3/uL (ref 150–400)
RBC: 4.71 MIL/uL (ref 3.87–5.11)
RDW: 15 % (ref 11.5–15.5)
WBC: 9.2 10*3/uL (ref 4.0–10.5)

## 2014-05-13 MED ORDER — DILTIAZEM HCL ER COATED BEADS 300 MG PO CP24: 300.0000 mg | ORAL_CAPSULE | Freq: Every day | ORAL | Status: DC

## 2014-05-13 MED ORDER — METOPROLOL TARTRATE 25 MG PO TABS: 25.0000 mg | ORAL_TABLET | Freq: Two times a day (BID) | ORAL | Status: DC

## 2014-05-13 MED ORDER — RIVAROXABAN 20 MG PO TABS: 20.0000 mg | ORAL_TABLET | Freq: Every day | ORAL | Status: DC

## 2014-05-13 MED ORDER — POTASSIUM CHLORIDE CRYS ER 20 MEQ PO TBCR
40.0000 meq | EXTENDED_RELEASE_TABLET | Freq: Once | ORAL | Status: AC
  Administered 2014-05-13: 40 meq via ORAL
  Filled 2014-05-13: qty 2

## 2014-05-13 MED ORDER — HYDROCODONE-ACETAMINOPHEN 5-325 MG PO TABS: 1.0000 | ORAL_TABLET | Freq: Three times a day (TID) | ORAL | Status: DC | PRN

## 2014-05-13 NOTE — Discharge Instructions (Signed)

## 2014-05-13 NOTE — Progress Notes (Signed)
CPAP set up in Pt room ready for use. Checked on Pt twice to see if she was ready. Pt stated she was not ready yet. She was told to call the RT when ready.

## 2014-05-13 NOTE — Discharge Summary (Signed)
Physician Discharge Summary  DAVIDA FALCONI YQI:[id number] DOB: Mar 25, 1961 DOA: 05/12/2014  PCP: Sol Passer, MD  Admit date: 05/12/2014 Discharge date: 05/13/2014  Time spent: Less than 30 minutes  Recommendations for Outpatient Follow-up:  1. Dr. Everardo Pacific, PCP in 1 week. 2. Dr. Kela Millin, Cardiology: Patient is to call for appointment on 05/14/14.  Discharge Diagnoses:  Principal Problem:   Atrial fibrillation with RVR Active Problems:   Hypokalemia   Essential hypertension   Severe persistent asthma   OSA on CPAP   Toxic nodular goiter   Morbid obesity   Discharge Condition: Improved & Stable  Diet recommendation: Heart healthy diet.  Filed Weights   05/12/14 1301 05/12/14 1517 05/13/14 0418  Weight: 115.214 kg (254 lb) 117.255 kg (258 lb 8 oz) 116.847 kg (257 lb 9.6 oz)    History of present illness:  54 y.o. female with history of HTN, HLD, severe persistent asthma, toxic multinodular goiter, OSA on nightly C Pap, vitamin D deficiency, osteoporosis recent negative cardiac cath and CTA chest at [facility], morbid obesity, history of bariatric surgery-had lost about 100 pounds but has gained most of the weight back, presented to ED with complaints of throat pain, earache and headache after eating a breakfast sandwich at [employer] on 05/12/14. By the time she arrived in the ED, throat pain and earache had resolved and headache was improving. She had unchanged chronic dyspnea. She was incidentally noted to be in A. fib with RVR in the 150s and was admitted for further management.  Hospital Course:    1. New A. fib with RVR: Patient asymptomatic of palpitations so not sure if she has underlying PAF. Precipitants may be underlying chronic lung disease-asthma and OSA. Euthyroid clinically and by labs. She was briefly placed on IV Cardizem drip in the ED. Cardiology was consulted and transitioned her to oral Cardizem 240 mg daily, metoprolol 25 MG twice a day and  started Xarelto 20 mg daily (CHA2DS2-VASCScore: Risk Score 2). Cardiac Cath [facility]/09/14 at [facility]: No angiographically apparent CAD, borderline elevated left ventricular pressures. TSH and free T4 on 05/09/14 were normal. Patient has remained asymptomatic. Overnight she remained in A. fib with ventricular rate controlled in the 80s-90s. This morning her ventricular rate was intermittently elevated in the 100s-110s. Discussed with cardiology who recommended discharging her on Cardizem CD 300 mg daily along with metoprolol and Xarelto. May need to consider decreasing or stopping theophylline and consider radioactive ablation of thyroid due to long-term side effects of methimazole. Case management has provided resources for medications. 2. Hypokalemia: Improved. Replaced prior to DC. 3. Essential hypertension: Controlled. Since patient being newly started on Cardizem CD and metoprolol, will DC amlodipine and lisinopril to avoid hypotension (has not taken these medications since past week). Continue HCTZ and potassium supplements. 4. Severe persistent asthma: Continue home medications. Her PCP is concerned regarding VCD-awaiting evaluation by ENT. Follows with pulmonology. Slow prednisone taper now at 35 mg daily. Stable. 5. OSA: Continue nightly C Pap 6. Morbid obesity 7. Toxic multinodular goiter: Continue methimazole. TSH 05/09/14:2.71 and free T4 0.78. Please see above. 8. Headache, earache and throat pain: Temporal relationship to breakfast sandwich that she had 1/30 morning.? Food allergy. Resolved spontaneously. Patient stated she did not eat anything unusual.  Consultations:  Cardiology  Procedures:  None    Discharge Exam:  Complaints:  Denies complaints. No headache, earache, throat pain, palpitations, dyspnea or chest pain.  Filed Vitals:   05/12/14 2204 05/13/14 0418 05/13/14 0956 05/13/14  1053  BP: 126/82 132/94  108/57  Pulse: 85 60  89  Temp: 98.1 F (36.7 C) 98.1 F (36.7 C)     TempSrc: Oral Oral    Resp: 18 18    Height:      Weight:  116.847 kg (257 lb 9.6 oz)    SpO2: 98% 100% 98%     General exam: Moderately built and morbidly obese female seen ambulating comfortably in the room. Respiratory system: Clear. No increased work of breathing. Cardiovascular system: S1 & S2 heard, RRR. No JVD, murmurs, gallops, clicks or pedal edema. Telemetry: A. fib with ventricular rate in the 80s-90s overnight, intermittent tachycardia in the 100s-110's this morning. Gastrointestinal system: Abdomen is nondistended, soft and nontender. Normal bowel sounds heard. Central nervous system: Alert and oriented. No focal neurological deficits. Extremities: Symmetric 5 x 5 power.  Discharge Instructions      Discharge Instructions    Call MD for:  difficulty breathing, headache or visual disturbances    Complete by:  As directed      Call MD for:  extreme fatigue    Complete by:  As directed      Call MD for:  persistant dizziness or light-headedness    Complete by:  As directed      Call MD for:  severe uncontrolled pain    Complete by:  As directed      Call MD for:    Complete by:  As directed   Palpitations/heart racing.     Diet - low sodium heart healthy    Complete by:  As directed      Increase activity slowly    Complete by:  As directed             Medication List    STOP taking these medications        amLODipine 5 MG tablet  Commonly known as:  NORVASC     levocetirizine 5 MG tablet  Commonly known as:  XYZAL     lisinopril 20 MG tablet  Commonly known as:  PRINIVIL,ZESTRIL     promethazine 25 MG tablet  Commonly known as:  PHENERGAN      TAKE these medications        albuterol (2.5 MG/3ML) 0.083% nebulizer solution  Commonly known as:  PROVENTIL  Take 2.5 mg by nebulization every 6 (six) hours as needed for wheezing or shortness of breath.     albuterol 108 (90 BASE) MCG/ACT inhaler  Commonly known as:  PROVENTIL HFA;VENTOLIN HFA   Inhale 2 puffs into the lungs every 4 (four) hours as needed for wheezing.     aluminum chloride 20 % external solution  Commonly known as:  DRYSOL  Apply 1 application topically at bedtime as needed (for sweating).     atorvastatin 20 MG tablet  Commonly known as:  LIPITOR  Take 20 mg by mouth every evening.     beclomethasone 40 MCG/ACT inhaler  Commonly known as:  QVAR  Inhale 2 puffs into the lungs 2 (two) times daily.     brimonidine 0.15 % ophthalmic solution  Commonly known as:  ALPHAGAN  Place 1 drop into both eyes 2 (two) times daily.     calcium citrate 950 MG tablet  Commonly known as:  CALCITRATE - dosed in mg elemental calcium  Take 1 tablet by mouth daily.     citalopram 20 MG tablet  Commonly known as:  CELEXA  Take 20 mg by mouth at bedtime.  diclofenac sodium 1 % Gel  Commonly known as:  VOLTAREN  Apply 2 g topically 2 (two) times daily as needed (for arthritis).     diltiazem 300 MG 24 hr capsule  Commonly known as:  CARDIZEM CD  Take 1 capsule (300 mg total) by mouth daily.     docusate sodium 100 MG capsule  Commonly known as:  COLACE  Take 100 mg by mouth daily as needed for mild constipation.     esomeprazole 40 MG capsule  Commonly known as:  NEXIUM  Take 40 mg by mouth daily before breakfast.     fexofenadine 180 MG tablet  Commonly known as:  ALLEGRA  Take 180 mg by mouth daily.     fluticasone 50 MCG/ACT nasal spray  Commonly known as:  FLONASE  Place 2 sprays into the nose daily as needed for allergies.     gabapentin 300 MG capsule  Commonly known as:  NEURONTIN  Take 600 mg by mouth at bedtime.     hydrochlorothiazide 25 MG tablet  Commonly known as:  HYDRODIURIL  Take 25 mg by mouth daily with breakfast.     HYDROcodone-acetaminophen 5-325 MG per tablet  Commonly known as:  NORCO/VICODIN  Take 1 tablet by mouth every 8 (eight) hours as needed (for pain).     latanoprost 0.005 % ophthalmic solution  Commonly known as:   XALATAN  Place 1 drop into both eyes at bedtime.     methimazole 5 MG tablet  Commonly known as:  TAPAZOLE  Take 5 mg by mouth at bedtime.     metoprolol tartrate 25 MG tablet  Commonly known as:  LOPRESSOR  Take 1 tablet (25 mg total) by mouth 2 (two) times daily.     montelukast 10 MG tablet  Commonly known as:  SINGULAIR  Take 10 mg by mouth at bedtime.     multivitamin capsule  Take 1 capsule by mouth daily.     nitroGLYCERIN 0.4 MG SL tablet  Commonly known as:  NITROSTAT  Place 0.4 mg under the tongue every 5 (five) minutes as needed for chest pain.     ondansetron 4 MG disintegrating tablet  Commonly known as:  ZOFRAN-ODT  Take 4 mg by mouth every 8 (eight) hours as needed for nausea.     potassium chloride SA 20 MEQ tablet  Commonly known as:  K-DUR,KLOR-CON  Take 20 mEq by mouth 2 (two) times daily.     predniSONE 5 MG tablet  Commonly known as:  DELTASONE  Take 35 mg by mouth daily with breakfast.     rivaroxaban 20 MG Tabs tablet  Commonly known as:  XARELTO  Take 1 tablet (20 mg total) by mouth daily with supper.     theophylline 100 MG 24 hr capsule  Commonly known as:  THEO-24  Take 200-300 mg by mouth 2 (two) times daily. Takes 200mg  in the morning and 300mg  in the evening     tiotropium 18 MCG inhalation capsule  Commonly known as:  SPIRIVA  Place 18 mcg into inhaler and inhale daily.     Vitamin D (Ergocalciferol) 50000 UNITS Caps capsule  Commonly known as:  DRISDOL  Take 50,000 Units by mouth every Sunday.       Follow-up Information    Follow up with Laverda Page, MD.   Specialty:  Cardiology   Why:  Call on 05/14/2014 for appointment to be seen in 2 weeks   Contact information:   [address] [place] [address] [phone]       Follow up with Sol Passer, MD. Schedule an appointment as soon as possible for a visit in 1 week.   Specialty:  Internal Medicine   Contact information:   [address]  [place] [address] [phone]        The results of significant diagnostics from this hospitalization (including imaging, microbiology, ancillary and laboratory) are listed below for reference.    Significant Diagnostic Studies: Dg Chest 2 View  05/12/2014   CLINICAL DATA:  Shortness of breath.  EXAM: CHEST  2 VIEW  COMPARISON:  November 26, 2012.  FINDINGS: The heart size and mediastinal contours are within normal limits. No pneumothorax or pleural effusion is noted. Right lung is clear. Stable linear density is noted in left lung base consistent with scarring. No acute pulmonary disease is noted. The visualized skeletal structures are unremarkable.  IMPRESSION: No acute cardiopulmonary abnormality seen.   Electronically Signed   By: Sabino Dick M.D.   On: 05/12/2014 11:32    Microbiology: No results found for this or any previous visit (from the past 240 hour(s)).   Labs: Basic Metabolic Panel:  Recent Labs Lab 05/12/14 1023 05/13/14 0545  NA 145 145  K 2.9* 3.6  CL 111 112  CO2 26 26  GLUCOSE 75 79  BUN 17 20  CREATININE 1.20* 1.08  CALCIUM 8.7 8.4   Liver Function Tests:  Recent Labs Lab 05/12/14 1023  AST 36  ALT 19  ALKPHOS 61  BILITOT 0.4  PROT 5.9*  ALBUMIN 3.3*   No results for input(s): LIPASE, AMYLASE in the last 168 hours. No results for input(s): AMMONIA in the last 168 hours. CBC:  Recent Labs Lab 05/12/14 1023 05/13/14 0545  WBC 7.6 9.2  NEUTROABS 6.0  --   HGB 14.0 13.5  HCT 44.7 43.5  MCV 92.9 92.4  PLT 259 252   Cardiac Enzymes: No results for input(s): CKTOTAL, CKMB, CKMBINDEX, TROPONINI in the last 168 hours. BNP: BNP (last 3 results) No results for input(s): PROBNP in the last 8760 hours. CBG: No results for input(s): GLUCAP in the last 168 hours.    Signed:  Vernell Leep, MD, FACP, FHM. [facility] Pager [phone]  If 7PM-7AM, please contact night-coverage [email or web address] Password [facility] 05/13/2014, 12:28  PM

## 2014-05-13 NOTE — Care Management Note (Signed)
CARE MANAGEMENT NOTE 05/13/2014  Patient:  Deborah Murray, Deborah Murray   Account Number:  [id number]  Date Initiated:  05/13/2014  Documentation initiated by:  Venita Sheffield HARRIS  Subjective/Objective Assessment:   A. Fibrillation with RVR     Action/Plan:   Anticipated DC Date:  05/13/2014   Anticipated DC Plan:  [place]  CM consult  Medication Assistance      Choice offered to / List presented to:             Status of service:  Completed, signed off Medicare Important Message given?   (If response is "NO", the following Medicare IM given date fields will be blank) Date Medicare IM given:   Medicare IM given by:   Date Additional Medicare IM given:   Additional Medicare IM given by:    Discharge Disposition:  HOME/SELF CARE  Per UR Regulation:    If discussed at Long Length of Stay Meetings, dates discussed:    Comments:  05/13/14 1210 - CM spoke with patient. Patient reports she has no difficulty paying for her medications with her insurance. Uses [employer] for her pharmacy benefit and states she receives a 90 day supply of her medication. 30 day Xarelto card given to patient. Venita Sheffield RN BSN CCM [phone]

## 2014-09-11 ENCOUNTER — Encounter (HOSPITAL_COMMUNITY)
Admission: RE | Admit: 2014-09-11 | Discharge: 2014-09-11 | Disposition: A | Discharge status: Home / Self Care | Payer: Medicare Other | Source: Ambulatory Visit | Attending: Pulmonary Disease | Admitting: Pulmonary Disease

## 2014-09-11 ENCOUNTER — Encounter (HOSPITAL_COMMUNITY): Payer: Self-pay

## 2014-09-11 VITALS — BP 122/88 | HR 57 | Ht 62.0 in | Wt 262.3 lb

## 2014-09-11 DIAGNOSIS — J4550 Severe persistent asthma, uncomplicated: Secondary | ICD-10-CM | POA: Insufficient documentation

## 2014-09-11 NOTE — Progress Notes (Signed)
Patient arrived at 0800.  Patient referred to Cardiac Rehab by [facility] due to Asthma (J45.50).  Dr. Luisa Dago is her Pulmonologist and Dr. Alba Destine is her PCP.  During orientation advised patient on arrival and appointment times what to wear, what to do before, during and after exercise.  Reviewed attendance and class policy.  Talked about inclement weather and class consultation policy. Patient is scheduled to start cardiac Rehab on September 18, 2014 at 1045.  Patient was advised to come to class 5 minutes before class starts.  She was also given instructions on meeting with the dietician and attending the Family Structure classes. Pt is eager to get started.  Patient able to finish Pre 6 minute walk test with assistance of pushing w/c. Education/Orientation ended at 1000.

## 2014-09-11 NOTE — Patient Instructions (Signed)
Pt has finished orientation and is scheduled to start CR on September 18, 2014 at 1045.  Pt has been instructed to arrive to class 15 minutes early for scheduled class. Pt has been instructed to wear comfortable clothing and shoes with rubber soles. Pt has been told to take their medications 1 hour prior to coming to class.  If the patient is not going to attend class, she has been instructed to call.

## 2014-09-11 NOTE — Progress Notes (Signed)
Cardiac/Pulmonary Rehab Medication Review by a Pharmacist    Does the patient  feel that his/her medications are working for him/her?  yes  Has the patient been experiencing any side effects to the medications prescribed?  no  Does the patient measure his/her own blood pressure or blood glucose at home?  no   Does the patient have any problems obtaining medications due to transportation or finances?   no  Understanding of regimen: fair Understanding of indications: excellent Potential of compliance: good  Questions asked to Determine Patient Understanding of Medication Regimen:  1. What is the name of the medication?  2. What is the medication used for?  3. When should it be taken?  4. How much should be taken?  5. How will you take it?  6. What side effects should you report?  Understanding Defined as: Excellent: All questions above are correct Good: Questions 1-4 are correct Fair: Questions 1-2 are correct  Poor: 1 or none of the above questions are correct   Deborah Murray 09/11/2014 8:33 AM

## 2014-09-18 ENCOUNTER — Encounter (HOSPITAL_COMMUNITY)
Admission: RE | Admit: 2014-09-18 | Discharge: 2014-09-18 | Disposition: A | Discharge status: Home / Self Care | Payer: Medicare Other | Source: Ambulatory Visit | Attending: Pulmonary Disease | Admitting: Pulmonary Disease

## 2014-09-18 DIAGNOSIS — J4550 Severe persistent asthma, uncomplicated: Secondary | ICD-10-CM | POA: Insufficient documentation

## 2014-09-20 ENCOUNTER — Encounter (HOSPITAL_COMMUNITY)
Admission: RE | Admit: 2014-09-20 | Discharge: 2014-09-20 | Disposition: A | Discharge status: Home / Self Care | Payer: Medicare Other | Source: Ambulatory Visit | Attending: Pulmonary Disease | Admitting: Pulmonary Disease

## 2014-09-20 DIAGNOSIS — J4550 Severe persistent asthma, uncomplicated: Secondary | ICD-10-CM | POA: Diagnosis not present

## 2014-09-25 ENCOUNTER — Encounter (HOSPITAL_COMMUNITY)
Admission: RE | Admit: 2014-09-25 | Discharge: 2014-09-25 | Disposition: A | Discharge status: Home / Self Care | Payer: Medicare Other | Source: Ambulatory Visit | Attending: Pulmonary Disease | Admitting: Pulmonary Disease

## 2014-09-25 DIAGNOSIS — J4550 Severe persistent asthma, uncomplicated: Secondary | ICD-10-CM | POA: Diagnosis not present

## 2014-09-27 ENCOUNTER — Encounter (HOSPITAL_COMMUNITY)
Admission: RE | Admit: 2014-09-27 | Discharge: 2014-09-27 | Disposition: A | Discharge status: Home / Self Care | Payer: Medicare Other | Source: Ambulatory Visit | Attending: Pulmonary Disease | Admitting: Pulmonary Disease

## 2014-09-27 DIAGNOSIS — J4550 Severe persistent asthma, uncomplicated: Secondary | ICD-10-CM | POA: Diagnosis not present

## 2014-10-02 ENCOUNTER — Encounter (HOSPITAL_COMMUNITY)
Admission: RE | Admit: 2014-10-02 | Discharge: 2014-10-02 | Disposition: A | Discharge status: Home / Self Care | Payer: Medicare Other | Source: Ambulatory Visit | Attending: Pulmonary Disease | Admitting: Pulmonary Disease

## 2014-10-02 DIAGNOSIS — J4550 Severe persistent asthma, uncomplicated: Secondary | ICD-10-CM | POA: Diagnosis not present

## 2014-10-04 ENCOUNTER — Encounter (HOSPITAL_COMMUNITY)
Admission: RE | Admit: 2014-10-04 | Discharge: 2014-10-04 | Disposition: A | Discharge status: Home / Self Care | Payer: Medicare Other | Source: Ambulatory Visit | Attending: Pulmonary Disease | Admitting: Pulmonary Disease

## 2014-10-04 DIAGNOSIS — J4550 Severe persistent asthma, uncomplicated: Secondary | ICD-10-CM | POA: Diagnosis not present

## 2014-10-05 NOTE — Progress Notes (Addendum)
Pulmonary Rehabilitation Program Outcomes Report   Orientation:  09/11/14 Graduate Date:  tbd Discharge Date:  tbd # of sessions completed: 3  Pulmonologist: Conterato Family MD:  Heide Guile Time:  2947  A.  Exercise Program:  Tolerates exercise @ 3.75 METS for 15 minutes and Walk Test Results:  Pre: 2.02 mets  B.  Mental Health:  Good mental attitude  C.  Education/Instruction/Skills  Accurately checks own pulse.  Rest:  70  Exercise:  77  Demonstrates accurate diaphragmatic breathing  D.  Nutrition/Weight Control/Body Composition:  Adherence to prescribed nutrition program: good    E.  Blood Lipids   No results found for: CHOL, HDL, LDLCALC, LDLDIRECT, TRIG, CHOLHDL  F.  Lifestyle Changes:  Making positive lifestyle changes  G.  Symptoms noted with exercise:  Asymptomatic  Report Completed By:  Stevphen Rochester RN    Comments:  This is patients first week progress note with [facility].

## 2014-10-09 ENCOUNTER — Encounter (HOSPITAL_COMMUNITY)
Admission: RE | Admit: 2014-10-09 | Discharge: 2014-10-09 | Disposition: A | Discharge status: Home / Self Care | Payer: Medicare Other | Source: Ambulatory Visit | Attending: Pulmonary Disease | Admitting: Pulmonary Disease

## 2014-10-09 DIAGNOSIS — J4550 Severe persistent asthma, uncomplicated: Secondary | ICD-10-CM | POA: Diagnosis not present

## 2014-10-09 NOTE — Progress Notes (Signed)
Patient was given Individual Home Exercise Plan today, 10/09/14. Handout was reviewed and discussed. Patient verbalized an understanding.

## 2014-10-11 ENCOUNTER — Encounter (HOSPITAL_COMMUNITY)
Admission: RE | Admit: 2014-10-11 | Discharge: 2014-10-11 | Disposition: A | Discharge status: Home / Self Care | Payer: Medicare Other | Source: Ambulatory Visit | Attending: Pulmonary Disease | Admitting: Pulmonary Disease

## 2014-10-11 DIAGNOSIS — J4550 Severe persistent asthma, uncomplicated: Secondary | ICD-10-CM | POA: Diagnosis not present

## 2014-10-16 ENCOUNTER — Encounter (HOSPITAL_COMMUNITY)
Admission: RE | Admit: 2014-10-16 | Discharge: 2014-10-16 | Disposition: A | Discharge status: Home / Self Care | Payer: Medicare Other | Source: Ambulatory Visit | Attending: Pulmonary Disease | Admitting: Pulmonary Disease

## 2014-10-16 DIAGNOSIS — J4550 Severe persistent asthma, uncomplicated: Secondary | ICD-10-CM | POA: Diagnosis not present

## 2014-10-18 ENCOUNTER — Encounter (HOSPITAL_COMMUNITY)
Admission: RE | Admit: 2014-10-18 | Discharge: 2014-10-18 | Disposition: A | Discharge status: Home / Self Care | Payer: Medicare Other | Source: Ambulatory Visit | Attending: Pulmonary Disease | Admitting: Pulmonary Disease

## 2014-10-18 DIAGNOSIS — J4550 Severe persistent asthma, uncomplicated: Secondary | ICD-10-CM | POA: Diagnosis not present

## 2014-10-23 ENCOUNTER — Encounter (HOSPITAL_COMMUNITY)
Admission: RE | Admit: 2014-10-23 | Discharge: 2014-10-23 | Disposition: A | Discharge status: Home / Self Care | Payer: Medicare Other | Source: Ambulatory Visit | Attending: Pulmonary Disease | Admitting: Pulmonary Disease

## 2014-10-23 DIAGNOSIS — J4550 Severe persistent asthma, uncomplicated: Secondary | ICD-10-CM | POA: Diagnosis not present

## 2014-10-25 ENCOUNTER — Encounter (HOSPITAL_COMMUNITY)
Admission: RE | Admit: 2014-10-25 | Discharge: 2014-10-25 | Disposition: A | Discharge status: Home / Self Care | Payer: Medicare Other | Source: Ambulatory Visit | Attending: Pulmonary Disease | Admitting: Pulmonary Disease

## 2014-10-25 DIAGNOSIS — J4550 Severe persistent asthma, uncomplicated: Secondary | ICD-10-CM | POA: Diagnosis not present

## 2014-10-30 ENCOUNTER — Emergency Department (HOSPITAL_COMMUNITY)
Admission: EM | Admit: 2014-10-30 | Discharge: 2014-10-30 | Disposition: A | Discharge status: Home / Self Care | Payer: Medicare Other | Attending: Emergency Medicine | Admitting: Emergency Medicine

## 2014-10-30 ENCOUNTER — Encounter (HOSPITAL_COMMUNITY): Payer: Self-pay | Admitting: Emergency Medicine

## 2014-10-30 ENCOUNTER — Encounter (HOSPITAL_COMMUNITY)
Admission: RE | Admit: 2014-10-30 | Discharge: 2014-10-30 | Disposition: A | Discharge status: Home / Self Care | Payer: Medicare Other | Source: Ambulatory Visit | Attending: Pulmonary Disease | Admitting: Pulmonary Disease

## 2014-10-30 DIAGNOSIS — Z7952 Long term (current) use of systemic steroids: Secondary | ICD-10-CM | POA: Insufficient documentation

## 2014-10-30 DIAGNOSIS — Z8639 Personal history of other endocrine, nutritional and metabolic disease: Secondary | ICD-10-CM | POA: Diagnosis not present

## 2014-10-30 DIAGNOSIS — J4550 Severe persistent asthma, uncomplicated: Secondary | ICD-10-CM | POA: Diagnosis not present

## 2014-10-30 DIAGNOSIS — E785 Hyperlipidemia, unspecified: Secondary | ICD-10-CM | POA: Insufficient documentation

## 2014-10-30 DIAGNOSIS — Z7951 Long term (current) use of inhaled steroids: Secondary | ICD-10-CM | POA: Insufficient documentation

## 2014-10-30 DIAGNOSIS — M7981 Nontraumatic hematoma of soft tissue: Secondary | ICD-10-CM | POA: Diagnosis not present

## 2014-10-30 DIAGNOSIS — Z79899 Other long term (current) drug therapy: Secondary | ICD-10-CM | POA: Insufficient documentation

## 2014-10-30 DIAGNOSIS — Z9889 Other specified postprocedural states: Secondary | ICD-10-CM | POA: Diagnosis not present

## 2014-10-30 DIAGNOSIS — J45909 Unspecified asthma, uncomplicated: Secondary | ICD-10-CM | POA: Diagnosis not present

## 2014-10-30 DIAGNOSIS — M79631 Pain in right forearm: Secondary | ICD-10-CM | POA: Diagnosis present

## 2014-10-30 DIAGNOSIS — Z880 Allergy status to penicillin: Secondary | ICD-10-CM | POA: Insufficient documentation

## 2014-10-30 DIAGNOSIS — Z7901 Long term (current) use of anticoagulants: Secondary | ICD-10-CM | POA: Insufficient documentation

## 2014-10-30 DIAGNOSIS — R58 Hemorrhage, not elsewhere classified: Secondary | ICD-10-CM

## 2014-10-30 DIAGNOSIS — I10 Essential (primary) hypertension: Secondary | ICD-10-CM | POA: Insufficient documentation

## 2014-10-30 NOTE — Discharge Instructions (Signed)
Elevate arm. Ice pack. Return for any worsening condition. You may have a small increase in the black and blue area

## 2014-10-30 NOTE — ED Notes (Addendum)
Patient reports noticed bruise to right forearm today and wanted to have it checked out. States she also feels a knot to area. Denies known injury.

## 2014-10-30 NOTE — ED Notes (Signed)
Patient with no complaints at this time. Respirations even and unlabored. Skin warm/dry. Discharge instructions reviewed with patient at this time. Patient given opportunity to voice concerns/ask questions. Patient discharged at this time and left Emergency Department with steady gait.   

## 2014-10-30 NOTE — ED Provider Notes (Signed)
CSN: [id number]     Arrival date & time 10/30/14  2014 History   First MD Initiated Contact with Patient 10/30/14 2034     Chief Complaint  Patient presents with  . Arm Pain     (Consider location/radiation/quality/duration/timing/severity/associated sxs/prior Treatment) HPI...... area of discoloration on right forearm since this morning. No trauma or injury. No chest pain or dyspnea. She is on Eliquis for cardiac issues.  Past Medical History  Diagnosis Date  . Asthma   . Hyperlipidemia   . Hypertension   . Syncope and collapse 05/11/11  adrenal insuffiency  treated at [facility]  . Thyroid disease    Past Surgical History  Procedure Laterality Date  . Cardiac catheterization  2012 at [facility]  . 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 Abdominal hysterectomy    . Cholecystectomy, laparoscopic    . Eye surg for glaucoma    . Right knee surg    . Cholecystectomy     Family History  Problem Relation Age of Onset  . Heart attack Mother   . Hypertension Mother   . Heart attack Father   . Asthma Father   . Hyperlipidemia Father   . Hypertension Father   . Heart attack Sister   . Arrhythmia Sister   . Asthma Sister   . Hyperlipidemia Sister   . Hypertension Sister   . Asthma Brother   . Hypertension Brother    History  Substance Use Topics  . Smoking status: Never Smoker   . Smokeless tobacco: Never Used  . Alcohol Use: No   OB History    No data available     Review of Systems  All other systems reviewed and are negative.     Allergies  Aspirin; Codeine sulfate; Darvocet; Tramadol; and Penicillins  Home Medications   Prior to Admission medications   Medication Sig Start Date End Date Taking? Authorizing Provider  albuterol (PROVENTIL HFA;VENTOLIN HFA) 108 (90 BASE) MCG/ACT inhaler Inhale 2 puffs into the lungs every 4 (four) hours as needed for wheezing.    Yes Historical Provider, MD  albuterol (PROVENTIL) (2.5 MG/3ML) 0.083% nebulizer  solution Take 2.5 mg by nebulization every 6 (six) hours as needed for wheezing or shortness of breath.   Yes Historical Provider, MD  aluminum chloride (DRYSOL) 20 % external solution Apply 1 application topically at bedtime as needed (for sweating).    Yes Historical Provider, MD  atorvastatin (LIPITOR) 20 MG tablet Take 20 mg by mouth every evening.    Yes Historical Provider, MD  brimonidine (ALPHAGAN) 0.15 % ophthalmic solution Place 1 drop into both eyes 2 (two) times daily.    Yes Historical Provider, MD  budesonide-formoterol (SYMBICORT) 160-4.5 MCG/ACT inhaler Inhale 2 puffs into the lungs daily.   Yes Historical Provider, MD  calcium citrate (CALCITRATE - DOSED IN MG ELEMENTAL CALCIUM) 950 MG tablet Take 1 tablet by mouth daily.   Yes Historical Provider, MD  cetirizine (ZYRTEC) 10 MG tablet Take 10 mg by mouth at bedtime.   Yes Historical Provider, MD  Cholecalciferol (VITAMIN D) 2000 UNITS tablet Take 2,000 Units by mouth daily.   Yes Historical Provider, MD  citalopram (CELEXA) 20 MG tablet Take 20 mg by mouth at bedtime.    Yes Historical Provider, MD  cyclobenzaprine (FLEXERIL) 5 MG tablet Take 5 mg by mouth every 6 (six) hours as needed. Takes for muscle spasms 11/09/09  Yes Historical Provider, MD  esomeprazole (NEXIUM) 40 MG capsule  Take 40 mg by mouth daily before breakfast.   Yes Historical Provider, MD  fluticasone (FLONASE) 50 MCG/ACT nasal spray Place 2 sprays into the nose daily as needed for allergies. One spray in each nostril   Yes Historical Provider, MD  gabapentin (NEURONTIN) 300 MG capsule Take 300-600 mg by mouth 3 (three) times daily. Takes 1 in the morning & afternoon; 2 capsules at bedtime   Yes Historical Provider, MD  latanoprost (XALATAN) 0.005 % ophthalmic solution Place 1 drop into both eyes at bedtime.   Yes Historical Provider, MD  lisinopril-hydrochlorothiazide (PRINZIDE,ZESTORETIC) 20-25 MG per tablet Take 1 tablet by mouth daily.   Yes Historical Provider, MD   montelukast (SINGULAIR) 10 MG tablet Take 10 mg by mouth at bedtime.   Yes Historical Provider, MD  Multiple Vitamin (MULTIVITAMIN) capsule Take 1 capsule by mouth daily.   Yes Historical Provider, MD  nitroGLYCERIN (NITROSTAT) 0.4 MG SL tablet Place 0.4 mg under the tongue every 5 (five) minutes as needed for chest pain.   Yes Historical Provider, MD  potassium chloride SA (K-DUR,KLOR-CON) 20 MEQ tablet Take 20 mEq by mouth 2 (two) times daily.   Yes Historical Provider, MD  predniSONE (DELTASONE) 1 MG tablet Take 3 mg by mouth daily with breakfast. Total daily dose = 13mg    Yes Historical Provider, MD  predniSONE (DELTASONE) 10 MG tablet Take 10 mg by mouth daily with breakfast. Takes with 3 x 1mg  tablet for total dose = 13mg    Yes Historical Provider, MD  rivaroxaban (XARELTO) 20 MG TABS tablet Take 1 tablet (20 mg total) by mouth daily with supper. 05/13/14  Yes Modena Jansky, MD   BP 120/85 mmHg  Pulse 62  Temp(Src) 98 F (36.7 C) (Oral)  Resp 18  Ht 5\' 2"  (1.575 m)  Wt 260 lb (117.935 kg)  BMI 47.54 kg/m2  SpO2 99% Physical Exam  Constitutional: She is oriented to person, place, and time. She appears well-developed and well-nourished.  HENT:  Head: Normocephalic and atraumatic.  Eyes: Conjunctivae and EOM are normal. Pupils are equal, round, and reactive to light.  Neck: Normal range of motion. Neck supple.  Musculoskeletal: Normal range of motion.  Neurological: She is alert and oriented to person, place, and time.  Skin:  Right forearm: 2 x 3 cm area of ecchymosis on the midshaft anterior aspect with 0.5 cm mobile nontender indurated nodule  Psychiatric: She has a normal mood and affect. Her behavior is normal.  Nursing note and vitals reviewed.   ED Course  Procedures (including critical care time) Labs Review Labs Reviewed - No data to display  Imaging Review No results found.   EKG Interpretation None      MDM   Final diagnoses:  Ecchymosis    Patient  is a small area of ecchymosis. No evidence of cellulitis. Elevate, ice, reassurance    Nat Christen, MD 10/30/14 2122

## 2014-11-01 ENCOUNTER — Encounter (HOSPITAL_COMMUNITY): Payer: Medicare Other

## 2014-11-06 ENCOUNTER — Encounter (HOSPITAL_COMMUNITY)
Admission: RE | Admit: 2014-11-06 | Discharge: 2014-11-06 | Disposition: A | Discharge status: Home / Self Care | Payer: Medicare Other | Source: Ambulatory Visit | Attending: Pulmonary Disease | Admitting: Pulmonary Disease

## 2014-11-06 DIAGNOSIS — J4550 Severe persistent asthma, uncomplicated: Secondary | ICD-10-CM | POA: Diagnosis not present

## 2014-11-08 ENCOUNTER — Encounter (HOSPITAL_COMMUNITY)
Admission: RE | Admit: 2014-11-08 | Discharge: 2014-11-08 | Disposition: A | Discharge status: Home / Self Care | Payer: Medicare Other | Source: Ambulatory Visit | Attending: Pulmonary Disease | Admitting: Pulmonary Disease

## 2014-11-08 DIAGNOSIS — J4550 Severe persistent asthma, uncomplicated: Secondary | ICD-10-CM | POA: Diagnosis not present

## 2014-11-13 ENCOUNTER — Encounter (HOSPITAL_COMMUNITY)
Admission: RE | Admit: 2014-11-13 | Discharge: 2014-11-13 | Disposition: A | Discharge status: Home / Self Care | Payer: Medicare Other | Source: Ambulatory Visit | Attending: Pulmonary Disease | Admitting: Pulmonary Disease

## 2014-11-13 DIAGNOSIS — J4550 Severe persistent asthma, uncomplicated: Secondary | ICD-10-CM | POA: Diagnosis present

## 2014-11-15 ENCOUNTER — Encounter (HOSPITAL_COMMUNITY)
Admission: RE | Admit: 2014-11-15 | Discharge: 2014-11-15 | Disposition: A | Discharge status: Home / Self Care | Payer: Medicare Other | Source: Ambulatory Visit | Attending: Pulmonary Disease | Admitting: Pulmonary Disease

## 2014-11-15 DIAGNOSIS — J4550 Severe persistent asthma, uncomplicated: Secondary | ICD-10-CM | POA: Diagnosis not present

## 2014-11-20 ENCOUNTER — Encounter (HOSPITAL_COMMUNITY)
Admission: RE | Admit: 2014-11-20 | Discharge: 2014-11-20 | Disposition: A | Discharge status: Home / Self Care | Payer: Medicare Other | Source: Ambulatory Visit | Attending: Pulmonary Disease | Admitting: Pulmonary Disease

## 2014-11-20 DIAGNOSIS — J4550 Severe persistent asthma, uncomplicated: Secondary | ICD-10-CM | POA: Diagnosis not present

## 2014-11-22 ENCOUNTER — Encounter (HOSPITAL_COMMUNITY)
Admission: RE | Admit: 2014-11-22 | Discharge: 2014-11-22 | Disposition: A | Discharge status: Home / Self Care | Payer: Medicare Other | Source: Ambulatory Visit | Attending: Pulmonary Disease | Admitting: Pulmonary Disease

## 2014-11-22 DIAGNOSIS — J4550 Severe persistent asthma, uncomplicated: Secondary | ICD-10-CM | POA: Diagnosis not present

## 2014-11-27 ENCOUNTER — Encounter (HOSPITAL_COMMUNITY)
Admission: RE | Admit: 2014-11-27 | Discharge: 2014-11-27 | Disposition: A | Discharge status: Home / Self Care | Payer: Medicare Other | Source: Ambulatory Visit | Attending: Pulmonary Disease | Admitting: Pulmonary Disease

## 2014-11-27 DIAGNOSIS — J4550 Severe persistent asthma, uncomplicated: Secondary | ICD-10-CM | POA: Diagnosis not present

## 2014-11-29 ENCOUNTER — Encounter (HOSPITAL_COMMUNITY): Payer: Medicare Other

## 2014-12-04 ENCOUNTER — Encounter (HOSPITAL_COMMUNITY)
Admission: RE | Admit: 2014-12-04 | Discharge: 2014-12-04 | Disposition: A | Discharge status: Home / Self Care | Payer: Medicare Other | Source: Ambulatory Visit | Attending: Pulmonary Disease | Admitting: Pulmonary Disease

## 2014-12-04 DIAGNOSIS — J4550 Severe persistent asthma, uncomplicated: Secondary | ICD-10-CM | POA: Diagnosis not present

## 2014-12-06 ENCOUNTER — Encounter (HOSPITAL_COMMUNITY)
Admission: RE | Admit: 2014-12-06 | Discharge: 2014-12-06 | Disposition: A | Discharge status: Home / Self Care | Payer: Medicare Other | Source: Ambulatory Visit | Attending: Pulmonary Disease | Admitting: Pulmonary Disease

## 2014-12-06 DIAGNOSIS — J4550 Severe persistent asthma, uncomplicated: Secondary | ICD-10-CM | POA: Diagnosis not present

## 2014-12-11 ENCOUNTER — Encounter (HOSPITAL_COMMUNITY)
Admission: RE | Admit: 2014-12-11 | Discharge: 2014-12-11 | Disposition: A | Discharge status: Home / Self Care | Payer: Medicare Other | Source: Ambulatory Visit | Attending: Pulmonary Disease | Admitting: Pulmonary Disease

## 2014-12-11 DIAGNOSIS — J4550 Severe persistent asthma, uncomplicated: Secondary | ICD-10-CM | POA: Diagnosis not present

## 2014-12-13 ENCOUNTER — Encounter (HOSPITAL_COMMUNITY): Payer: Medicare Other

## 2014-12-18 ENCOUNTER — Encounter (HOSPITAL_COMMUNITY): Payer: Medicare Other

## 2014-12-20 ENCOUNTER — Encounter (HOSPITAL_COMMUNITY): Payer: Medicare Other

## 2014-12-26 ENCOUNTER — Ambulatory Visit (HOSPITAL_COMMUNITY): Payer: Medicare Other | Attending: Family Medicine

## 2014-12-26 DIAGNOSIS — M6281 Muscle weakness (generalized): Secondary | ICD-10-CM | POA: Insufficient documentation

## 2014-12-26 DIAGNOSIS — Z9889 Other specified postprocedural states: Secondary | ICD-10-CM | POA: Insufficient documentation

## 2014-12-26 NOTE — Therapy (Signed)
[place], [place], [address] Phone: [phone]   Fax:  [phone]  Patient Details  Name: Deborah Murray MRN: [id number] Date of Birth: 1960/04/16 Referring Provider:  Stacey Drain, MD  Encounter Date: 12/26/2014 Patient arrived for OT evaluation and did not bring her MD order. Patient with hx of elbow surgery and therapist is unable to complete evaluation without MD order. Called Dr's office to have them fax over order. Staff informed Outpatient clinic that their policy is to wait 96-22 hours before faxing over another order. Patient was informed that evaluation can not be completed without order. Patient rescheduled OT evaluation for Thursday 9/15 at 2:30PM and will bring MD order.   Ailene Ravel, OTR/L,CBIS  [phone]  12/26/2014, 11:13 AM  [place], [place], [address] Phone: [phone]   Fax:  [phone]

## 2014-12-26 NOTE — Progress Notes (Signed)
Patient is discharged from [facility] program today, December 11, 2014 with 24 sessions.  She achieved LTG of 30 minutes of aerobic exercise at max met level of 3.75.  All patient vitals are WNL.  Patient has not met with dietician.  Discharge instructions have been reviewed in detail and patient expressed an understanding of material given.  Patient plans to exercise at home. Cardiac Rehab will make 1 month, 6 month and 1 year call backs.  Patient had no complaints of any abnormal S/S or pain on their exit visit.  Patient finished post walk test.

## 2014-12-26 NOTE — Progress Notes (Signed)
Pulmonary Rehabilitation Program Outcomes Report   Orientation:  09/11/14 Graduate Date:  12/11/14 Discharge Date:  12/11/14 # of sessions completed: 24  Pulmonologist: Conterato Family MD:  Heide Guile Time:  6010  A.  Exercise Program:  Tolerates exercise @ 3.75 METS for 15 minutes, Walk Test Results:  Post: 2.30 mets, Bike Test Results:  Post:  not done, Improved functional capacity  13.9 %, Improved  muscular strength  5.9 % and Improved dyspnea score 50 %  B.  Mental Health:  Good mental attitude  C.  Education/Instruction/Skills  Knows THR for exercise, Uses Perceived Exertion Scale and/or Dyspnea Scale and Attended all education classes  Demonstrates accurate pursed lip breathing  D.  Nutrition/Weight Control/Body Composition:  Adherence to prescribed nutrition program: fair    E.  Blood Lipids   No results found for: CHOL, HDL, LDLCALC, LDLDIRECT, TRIG, CHOLHDL  F.  Lifestyle Changes:  Making positive lifestyle changes  G.  Symptoms noted with exercise:  Asymptomatic  Report Completed By:  Stevphen Rochester RN   Comments:  This is the patients graduation note for [facility].  Patient done well in program. Encouraged maintenance program.

## 2014-12-27 ENCOUNTER — Ambulatory Visit (HOSPITAL_COMMUNITY): Payer: Medicare Other

## 2014-12-27 ENCOUNTER — Encounter (HOSPITAL_COMMUNITY): Payer: Self-pay

## 2014-12-27 DIAGNOSIS — M6281 Muscle weakness (generalized): Secondary | ICD-10-CM | POA: Diagnosis present

## 2014-12-27 DIAGNOSIS — Z9889 Other specified postprocedural states: Secondary | ICD-10-CM | POA: Diagnosis not present

## 2014-12-27 NOTE — Therapy (Signed)
[place], [place], [address] Phone: [phone]   Fax:  [phone]  Occupational Therapy Evaluation  Patient Details  Name: ANISHA STARLIPER MRN: [id number] Date of Birth: [birth year] Referring Provider:  Stacey Drain, MD  Encounter Date: 12/27/2014      OT End of Session - 12/27/14 1748    Visit Number 1   Number of Visits 1   Authorization Type Medicare primary Medicaid secondary   Authorization - Visit Number 1   Authorization - Number of Visits 10   OT Start Time 1435   OT Stop Time 1520   OT Time Calculation (min) 45 min   Activity Tolerance Patient tolerated treatment well   Behavior During Therapy [facility] for tasks assessed/performed      Past Medical History  Diagnosis Date  . Asthma   . Hyperlipidemia   . Hypertension   . Syncope and collapse 05/11/11  adrenal insuffiency  treated at [facility]  . Thyroid disease     Past Surgical History  Procedure Laterality Date  . Cardiac catheterization  2012 at [facility]  . 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 Abdominal hysterectomy    . Cholecystectomy, laparoscopic    . Eye surg for glaucoma    . Right knee surg    . Cholecystectomy      There were no vitals filed for this visit.  Visit Diagnosis:  Hx of elbow surgery  Muscle weakness (generalized)      Subjective Assessment - 12/27/14 1743    Subjective  S: My elbow only hurts if I pick up something which I'm not allowed to do until tomorrow.    Pertinent History Patient is a 54 y/o female S/P right elbow tenex procedure completed on 12/13/14. Pt does not recall any injury to right elbow. Pt does not report any issues with pain or ROM. Dr. Joni Fears has referred patient to occupational therapy for evaluation and treatment.    Special Tests FOTO score: 83/100   Patient Stated Goals To have better strength.   Currently in Pain? No/denies           [facility] OT Assessment - 12/27/14  1441    Assessment   Diagnosis Right elbow   Onset Date --  2012    Assessment 12/31/14 Joni Fears   Prior Therapy None   Precautions   Precautions Other (comment)  Elbow protocol   Restrictions   Weight Bearing Restrictions Yes   Balance Screen   Has the patient fallen in the past 6 months No   Home  Environment   Family/patient expects to be discharged to: Private residence   Prior Function   Level of [place] Retired   ADL   ADL comments Pt states that the only pain she feels is when she lifts an item.    Mobility   Mobility Status Independent   Written Expression   Dominant Hand Right   Vision - History   Baseline Vision No visual deficits   Cognition   Overall Cognitive Status Within Functional Limits for tasks assessed   ROM / Strength   AROM / PROM / Strength AROM;PROM;Strength   AROM   AROM Assessment Site Forearm;Elbow   Right/Left Elbow Right   Right Elbow Flexion 65   Right Elbow Extension 0   Right/Left Forearm Right   Right Forearm Pronation 90 Degrees   Right Forearm Supination 90 Degrees  Strength   Strength Assessment Site Hand;Elbow;Forearm   Right/Left Shoulder Right;Left   Right/Left Elbow Right   Right Elbow Flexion 3/5   Right Elbow Extension 3/5   Right/Left Forearm Right   Right Forearm Pronation 3/5   Right Forearm Supination 3/5   Right/Left hand Right;Left   Right Hand Grip (lbs) 70   Right Hand Lateral Pinch 20 lbs   Right Hand 3 Point Pinch 16 lbs   Left Hand Grip (lbs) 65   Left Hand Lateral Pinch 18 lbs   Left Hand 3 Point Pinch 17 lbs                         OT Education - Jan 24, 2015 1747    Education provided Yes   Education Details Elbow strengthening program including hand weights and red theraband. Reviewed each exercises. Covered appropriate amount of repetitions and when to increase. Also reviewed time frame of completing HEP and when to taper off.    Person(s) Educated Patient    Methods Explanation;Demonstration;Handout   Comprehension Verbalized understanding          OT Short Term Goals - 01/24/2015 1754    OT SHORT TERM GOAL #1   Title Patient will be educated and independent with HEP.    Time 1   Period Days   Status Achieved                  Plan - 01/24/15 1750    Clinical Impression Statement A: Patient is a 54 y/o female S/P right elbow Tenex procedure with only complaints of pain is she happens to pick up an item. patient informs OTR that she is not allowed to pick up anything heavy until tomorrow (2 weeks post op). Pt has not reports of decreased ROM. Discussed with patient that OTR can provide an strengthening HEP for her to complete independently at home. Patient was in agreement as her insurance does not cover 100% of therapy . Pt was given HEP and OTR demonstrated all exercises. Questions were answered and patient was in agreement with evaluation only.   Pt will benefit from skilled therapeutic intervention in order to improve on the following deficits (Retired) Decreased strength   Rehab Potential Excellent   OT Frequency 1x / week   OT Duration --  1 week   OT Treatment/Interventions Patient/family education   Plan P: 1 time eval with HEP.   Consulted and Agree with Plan of Care Patient          G-Codes - 01-24-15 1754    Functional Assessment Tool Used FOTO score: 83/100 (17% impaired)   Functional Limitation Carrying, moving and handling objects   Carrying, Moving and Handling Objects Current Status (Z0258) At least 1 percent but less than 20 percent impaired, limited or restricted   Carrying, Moving and Handling Objects Goal Status (N2778) At least 1 percent but less than 20 percent impaired, limited or restricted   Carrying, Moving and Handling Objects Discharge Status [phone]) At least 1 percent but less than 20 percent impaired, limited or restricted      Problem List Patient Active Problem List   Diagnosis Date Noted   . Atrial fibrillation with RVR 05/12/2014  . Hypokalemia 05/12/2014  . Essential hypertension 05/12/2014  . Severe persistent asthma 05/12/2014  . OSA on CPAP 05/12/2014  . Toxic nodular goiter 05/12/2014  . Morbid obesity 05/12/2014  . Pain in joint, shoulder region 08/02/2013  . Muscle weakness (  generalized) 08/02/2013  . Decreased range of motion of left shoulder 08/02/2013  . Tight fascia 08/02/2013    Ailene Ravel, OTR/L,CBIS  [phone]  12/27/2014, 5:57 PM  [place] [address] [place], [place], [address] Phone: [phone]   Fax:  [phone]

## 2014-12-27 NOTE — Patient Instructions (Signed)
BICEPS CURLS - RADIOBRACHIALIS  With your arm at your side, draw up your hand by bending at the elbow.   -Keep your wrist in a neutral position as shown above the entire time.  -Then flip your hand palm down.         ELASTIC BAND BICEPS CURLS  With your arm at your side holding an elastic band, draw up your hand by bending at the elbow.   Keep your palm face up the entire time.       ELASTIC BAND BICEP CURLS - RADIOBRACHIALIS  With your arm at your side holding an elastic band, draw up your hand by bending at the elbow.   Keep your palm facing sideways the entire time.     ELASTIC BAND BICEP CURLS - BRACHIALIS  With your arm at your side holding an elastic band, draw up your hand by bending at the elbow.   Keep your palm face down the entire time.    ELASTIC BAND TRICEP  Start with your elbow bent and holding an elastic band as shown. Pull the elastic band downward as you extend your elbow.   Keep your elbow by your side the entire time.

## 2015-06-07 ENCOUNTER — Encounter (HOSPITAL_COMMUNITY): Payer: Self-pay | Admitting: Emergency Medicine

## 2015-06-07 ENCOUNTER — Emergency Department (HOSPITAL_COMMUNITY)
Admission: EM | Admit: 2015-06-07 | Discharge: 2015-06-08 | Disposition: A | Discharge status: Home / Self Care | Payer: Medicare Other | Attending: Emergency Medicine | Admitting: Emergency Medicine

## 2015-06-07 DIAGNOSIS — Z888 Allergy status to other drugs, medicaments and biological substances status: Secondary | ICD-10-CM | POA: Diagnosis not present

## 2015-06-07 DIAGNOSIS — I10 Essential (primary) hypertension: Secondary | ICD-10-CM | POA: Diagnosis not present

## 2015-06-07 DIAGNOSIS — R109 Unspecified abdominal pain: Secondary | ICD-10-CM | POA: Diagnosis present

## 2015-06-07 DIAGNOSIS — J45901 Unspecified asthma with (acute) exacerbation: Secondary | ICD-10-CM | POA: Diagnosis not present

## 2015-06-07 DIAGNOSIS — Z79899 Other long term (current) drug therapy: Secondary | ICD-10-CM | POA: Diagnosis not present

## 2015-06-07 DIAGNOSIS — R079 Chest pain, unspecified: Secondary | ICD-10-CM | POA: Diagnosis not present

## 2015-06-07 DIAGNOSIS — Z7951 Long term (current) use of inhaled steroids: Secondary | ICD-10-CM | POA: Diagnosis not present

## 2015-06-07 DIAGNOSIS — Z792 Long term (current) use of antibiotics: Secondary | ICD-10-CM | POA: Diagnosis not present

## 2015-06-07 DIAGNOSIS — E785 Hyperlipidemia, unspecified: Secondary | ICD-10-CM | POA: Diagnosis not present

## 2015-06-07 DIAGNOSIS — R112 Nausea with vomiting, unspecified: Secondary | ICD-10-CM | POA: Insufficient documentation

## 2015-06-07 DIAGNOSIS — K529 Noninfective gastroenteritis and colitis, unspecified: Secondary | ICD-10-CM | POA: Diagnosis not present

## 2015-06-07 LAB — CBC
HCT: 41.9 % (ref 36.0–46.0)
Hemoglobin: 13.3 g/dL (ref 12.0–15.0)
MCH: 29 pg (ref 26.0–34.0)
MCHC: 31.7 g/dL (ref 30.0–36.0)
MCV: 91.5 fL (ref 78.0–100.0)
Platelets: 258 10*3/uL (ref 150–400)
RBC: 4.58 MIL/uL (ref 3.87–5.11)
RDW: 15.5 % (ref 11.5–15.5)
WBC: 6.7 10*3/uL (ref 4.0–10.5)

## 2015-06-07 LAB — URINALYSIS, ROUTINE W REFLEX MICROSCOPIC
BILIRUBIN URINE: NEGATIVE
Glucose, UA: NEGATIVE mg/dL
Hgb urine dipstick: NEGATIVE
KETONES UR: NEGATIVE mg/dL
Leukocytes, UA: NEGATIVE
NITRITE: NEGATIVE
PROTEIN: NEGATIVE mg/dL
Specific Gravity, Urine: >1.03 — ABNORMAL HIGH (ref 1.005–1.030)
pH: 5.5 (ref 5.0–8.0)

## 2015-06-07 LAB — COMPREHENSIVE METABOLIC PANEL
ALT: 21 U/L (ref 14–54)
ANION GAP: 8 (ref 5–15)
AST: 31 U/L (ref 15–41)
Albumin: 3.6 g/dL (ref 3.5–5.0)
Alkaline Phosphatase: 66 U/L (ref 38–126)
BILIRUBIN TOTAL: 0.2 mg/dL — AB (ref 0.3–1.2)
BUN: 24 mg/dL — AB (ref 6–20)
CO2: 23 mmol/L (ref 22–32)
Calcium: 9.2 mg/dL (ref 8.9–10.3)
Chloride: 113 mmol/L — ABNORMAL HIGH (ref 101–111)
Creatinine, Ser: 1.39 mg/dL — ABNORMAL HIGH (ref 0.44–1.00)
GFR calc Af Amer: 48 mL/min — ABNORMAL LOW (ref >60)
GFR, EST NON AFRICAN AMERICAN: 41 mL/min — AB (ref >60)
Glucose, Bld: 87 mg/dL (ref 65–99)
Potassium: 4 mmol/L (ref 3.5–5.1)
Sodium: 144 mmol/L (ref 135–145)
TOTAL PROTEIN: 6.3 g/dL — AB (ref 6.5–8.1)

## 2015-06-07 LAB — TROPONIN I

## 2015-06-07 LAB — LIPASE, BLOOD: Lipase: 17 U/L (ref 11–51)

## 2015-06-07 MED ORDER — ONDANSETRON 4 MG PO TBDP
ORAL_TABLET | ORAL | Status: AC
  Filled 2015-06-07: qty 1

## 2015-06-07 MED ORDER — ONDANSETRON 4 MG PO TBDP
4.0000 mg | ORAL_TABLET | Freq: Once | ORAL | Status: AC | PRN
  Administered 2015-06-07: 4 mg via ORAL

## 2015-06-07 NOTE — ED Notes (Signed)
Patient complaining of vomiting, diarrhea, and abdominal pain x 5 days. States she last vomited immediately prior to arrival to ED.

## 2015-06-07 NOTE — ED Notes (Signed)
PA at bedside.

## 2015-06-07 NOTE — ED Notes (Signed)
Pt c/o right side chest pain that started since she has been here. Pt points to under right breast where pain is at this time

## 2015-06-07 NOTE — ED Notes (Signed)
Patient feeling well at this time. PO fluids requested, and as per Evalee Jefferson PA, PO fluids given to patient.

## 2015-06-08 ENCOUNTER — Emergency Department (HOSPITAL_COMMUNITY): Payer: Medicare Other

## 2015-06-08 DIAGNOSIS — K529 Noninfective gastroenteritis and colitis, unspecified: Secondary | ICD-10-CM | POA: Diagnosis not present

## 2015-06-08 MED ORDER — ONDANSETRON 8 MG PO TBDP: 8.0000 mg | ORAL_TABLET | Freq: Three times a day (TID) | ORAL | Status: DC | PRN

## 2015-06-08 MED ORDER — IPRATROPIUM-ALBUTEROL 0.5-2.5 (3) MG/3ML IN SOLN
3.0000 mL | Freq: Once | RESPIRATORY_TRACT | Status: AC
  Administered 2015-06-08: 3 mL via RESPIRATORY_TRACT
  Filled 2015-06-08: qty 3

## 2015-06-08 MED ORDER — PREDNISONE 50 MG PO TABS
60.0000 mg | ORAL_TABLET | Freq: Once | ORAL | Status: AC
  Administered 2015-06-08: 60 mg via ORAL
  Filled 2015-06-08: qty 1

## 2015-06-08 MED ORDER — LOPERAMIDE HCL 2 MG PO CAPS: 2.0000 mg | ORAL_CAPSULE | Freq: Four times a day (QID) | ORAL | Status: DC | PRN

## 2015-06-08 MED ORDER — ALBUTEROL SULFATE (2.5 MG/3ML) 0.083% IN NEBU
5.0000 mg | INHALATION_SOLUTION | Freq: Once | RESPIRATORY_TRACT | Status: AC
  Administered 2015-06-08: 5 mg via RESPIRATORY_TRACT
  Filled 2015-06-08: qty 6

## 2015-06-08 MED ORDER — SODIUM CHLORIDE 0.9 % IV BOLUS (SEPSIS)
1000.0000 mL | Freq: Once | INTRAVENOUS | Status: AC
  Administered 2015-06-08: 1000 mL via INTRAVENOUS

## 2015-06-08 MED ORDER — PREDNISONE 10 MG PO TABS: ORAL_TABLET | ORAL | Status: DC

## 2015-06-08 NOTE — ED Provider Notes (Signed)
CSN: [id number]     Arrival date & time 06/07/15  1855 History   First MD Initiated Contact with Patient 06/07/15 2206     Chief Complaint  Patient presents with  . Emesis  . Diarrhea  . Abdominal Pain     (Consider location/radiation/quality/duration/timing/severity/associated sxs/prior Treatment) The history is provided by the patient.   Deborah Murray is a 55 y.o. female with a 5 day history of nausea, vomiting and diarrhea.  She endorses having nonbloody vomiting which was most frequent today, reporting 7-8 episodes of vomiting today, most recently before arrival here. She has had diarrhea about twice daily which is preceded by abdominal cramping, but then resolves after the bm and denies any other abdominal pain.  She has generalized weakness and fatigue and "just feels bad" today.  She has been able to tolerate some fluid intake but her appetite has been reduced.  Additionally she notes increased wheezing the past few days which briefly responds to her home nebulizer medication.  She denies sob.  She had a 5 minute episode of right sided sharp chest pain since arriving here tonight, now resolved.     Past Medical History  Diagnosis Date  . Asthma   . Hyperlipidemia   . Hypertension   . Syncope and collapse 05/11/11  adrenal insuffiency  treated at [facility]  . Thyroid disease    Past Surgical History  Procedure Laterality Date  . Cardiac catheterization  2012 at [facility]  . 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 Abdominal hysterectomy    . Cholecystectomy, laparoscopic    . Eye surg for glaucoma    . Right knee surg    . Cholecystectomy     Family History  Problem Relation Age of Onset  . Heart attack Mother   . Hypertension Mother   . Heart attack Father   . Asthma Father   . Hyperlipidemia Father   . Hypertension Father   . Heart attack Sister   . Arrhythmia Sister   . Asthma Sister   . Hyperlipidemia Sister   . Hypertension Sister   . Asthma  Brother   . Hypertension Brother    Social History  Substance Use Topics  . Smoking status: Never Smoker   . Smokeless tobacco: Never Used  . Alcohol Use: No   OB History    No data available     Review of Systems  Constitutional: Positive for fatigue. Negative for fever and chills.  HENT: Negative for congestion and sore throat.   Eyes: Negative.   Respiratory: Negative for chest tightness and shortness of breath.   Cardiovascular: Positive for chest pain.  Gastrointestinal: Positive for nausea, vomiting and diarrhea. Negative for abdominal pain and abdominal distention.  Genitourinary: Negative.   Musculoskeletal: Negative for joint swelling, arthralgias and neck pain.  Skin: Negative.  Negative for rash and wound.  Neurological: Positive for weakness. Negative for dizziness, light-headedness, numbness and headaches.  Psychiatric/Behavioral: Negative.       Allergies  Aspirin; Codeine sulfate; Darvocet; Tramadol; and Penicillins  Home Medications   Prior to Admission medications   Medication Sig Start Date End Date Taking? Authorizing Provider  albuterol (PROVENTIL HFA;VENTOLIN HFA) 108 (90 BASE) MCG/ACT inhaler Inhale 2 puffs into the lungs every 4 (four) hours as needed for wheezing.    Yes Historical Provider, MD  albuterol (PROVENTIL) (2.5 MG/3ML) 0.083% nebulizer solution Take 2.5 mg by nebulization every 6 (six) hours as needed for wheezing  or shortness of breath.   Yes Historical Provider, MD  aluminum chloride (DRYSOL) 20 % external solution Apply 1 application topically at bedtime as needed (for sweating).    Yes Historical Provider, MD  atorvastatin (LIPITOR) 20 MG tablet Take 20 mg by mouth every evening.    Yes Historical Provider, MD  brimonidine (ALPHAGAN) 0.15 % ophthalmic solution Place 1 drop into both eyes 2 (two) times daily.    Yes Historical Provider, MD  calcium citrate (CALCITRATE - DOSED IN MG ELEMENTAL CALCIUM) 950 MG tablet Take 1 tablet by mouth  daily.   Yes Historical Provider, MD  cetirizine (ZYRTEC) 10 MG tablet Take 10 mg by mouth at bedtime.   Yes Historical Provider, MD  Cholecalciferol (VITAMIN D) 2000 UNITS tablet Take 2,000 Units by mouth daily.   Yes Historical Provider, MD  citalopram (CELEXA) 20 MG tablet Take 20 mg by mouth at bedtime.    Yes Historical Provider, MD  clindamycin (CLEOCIN T) 1 % external solution Apply 1 application topically every evening.  11/08/09  Yes Historical Provider, MD  clobetasol cream (TEMOVATE) [id number] % Apply 1 application topically every evening.  10/28/09  Yes Historical Provider, MD  cyclobenzaprine (FLEXERIL) 5 MG tablet Take 5 mg by mouth 3 (three) times daily as needed for muscle spasms. Takes for muscle spasms 11/09/09  Yes Historical Provider, MD  DALIRESP 500 MCG TABS tablet Take 500 mg by mouth daily. 06/04/15  Yes Historical Provider, MD  esomeprazole (NEXIUM) 40 MG capsule Take 40 mg by mouth daily before breakfast.   Yes Historical Provider, MD  fluticasone (FLONASE) 50 MCG/ACT nasal spray Place 2 sprays into the nose daily as needed for allergies. One spray in each nostril   Yes Historical Provider, MD  fluticasone (FLOVENT HFA) 220 MCG/ACT inhaler Inhale 2 puffs into the lungs every 4 (four) hours. 06/24/09  Yes Historical Provider, MD  Fluticasone-Salmeterol (ADVAIR DISKUS) 250-50 MCG/DOSE AEPB Inhale 1 puff into the lungs 2 (two) times daily. 06/23/09  Yes Historical Provider, MD  gabapentin (NEURONTIN) 300 MG capsule Take 900 mg by mouth 3 (three) times daily.    Yes Historical Provider, MD  latanoprost (XALATAN) 0.005 % ophthalmic solution Place 1 drop into both eyes at bedtime.   Yes Historical Provider, MD  levocetirizine (XYZAL) 5 MG tablet Take 5 mg by mouth every evening. 06/04/15  Yes Historical Provider, MD  lisinopril-hydrochlorothiazide (PRINZIDE,ZESTORETIC) 20-25 MG per tablet Take 1 tablet by mouth daily.   Yes Historical Provider, MD  montelukast (SINGULAIR) 10 MG tablet Take 10  mg by mouth at bedtime.   Yes Historical Provider, MD  Multiple Vitamin (MULTIVITAMIN) capsule Take 1 capsule by mouth daily.   Yes Historical Provider, MD  nitroGLYCERIN (NITROSTAT) 0.4 MG SL tablet Place 0.4 mg under the tongue every 5 (five) minutes as needed for chest pain.   Yes Historical Provider, MD  phentermine 15 MG capsule Take 15 mg by mouth daily as needed (weight loss).    Yes Historical Provider, MD  potassium chloride SA (K-DUR,KLOR-CON) 20 MEQ tablet Take 20 mEq by mouth 2 (two) times daily.   Yes Historical Provider, MD  promethazine (PHENERGAN) 25 MG tablet Take 25 mg by mouth every 6 (six) hours as needed. 06/26/09  Yes Historical Provider, MD  rivaroxaban (XARELTO) 20 MG TABS tablet Take 1 tablet (20 mg total) by mouth daily with supper. 05/13/14  Yes Modena Jansky, MD  tiotropium (SPIRIVA HANDIHALER) 18 MCG inhalation capsule Place 1 capsule into inhaler and inhale  daily. 12/09/09  Yes Historical Provider, MD  loperamide (IMODIUM) 2 MG capsule Take 1 capsule (2 mg total) by mouth 4 (four) times daily as needed for diarrhea or loose stools. 06/08/15   Evalee Jefferson, PA-C  ondansetron (ZOFRAN ODT) 8 MG disintegrating tablet Take 1 tablet (8 mg total) by mouth every 8 (eight) hours as needed for nausea or vomiting. 06/08/15   Evalee Jefferson, PA-C  predniSONE (DELTASONE) 10 MG tablet 6, 5, 4, 3, 2 then 1 tablet by mouth daily for 6 days total. 06/08/15   Evalee Jefferson, PA-C   BP 111/79 mmHg  Pulse 62  Temp(Src) 98.1 F (36.7 C) (Oral)  Resp 19  Ht 5\' 2"  (1.575 m)  Wt 107.049 kg  BMI 43.15 kg/m2  SpO2 96% Physical Exam  Constitutional: She appears well-developed and well-nourished.  HENT:  Head: Normocephalic and atraumatic.  Eyes: Conjunctivae are normal.  Neck: Normal range of motion.  Cardiovascular: Normal rate, regular rhythm, normal heart sounds and intact distal pulses.   Pulmonary/Chest: Effort normal. She has wheezes. She has no rhonchi. She has no rales.  Wheezing  throughout all lungs fields, prolonged expirations.  Abdominal: Soft. Bowel sounds are normal. She exhibits no distension. There is no tenderness. There is no guarding.  Musculoskeletal: Normal range of motion.  Neurological: She is alert.  Skin: Skin is warm and dry.  Psychiatric: She has a normal mood and affect.  Nursing note and vitals reviewed.   ED Course  Procedures (including critical care time) Labs Review Labs Reviewed  COMPREHENSIVE METABOLIC PANEL - Abnormal; Notable for the following:    Chloride 113 (*)    BUN 24 (*)    Creatinine, Ser 1.39 (*)    Total Protein 6.3 (*)    Total Bilirubin 0.2 (*)    GFR calc non Af Amer 41 (*)    GFR calc Af Amer 48 (*)    All other components within normal limits  URINALYSIS, ROUTINE W REFLEX MICROSCOPIC (NOT AT [facility]) - Abnormal; Notable for the following:    Specific Gravity, Urine >1.030 (*)    All other components within normal limits  CBC  LIPASE, BLOOD  TROPONIN I    Imaging Review Dg Chest 2 View  06/08/2015  CLINICAL DATA:  Acute onset of shortness of breath and central abdominal pain. Right-sided chest pain. Initial encounter. EXAM: CHEST  2 VIEW COMPARISON:  Chest radiograph performed 05/15/2015 FINDINGS: The lungs are well-aerated and clear. There is no evidence of focal opacification, pleural effusion or pneumothorax. The heart is normal in size; the mediastinal contour is within normal limits. No acute osseous abnormalities are seen. Clips are noted within the right upper quadrant, reflecting prior cholecystectomy. IMPRESSION: No acute cardiopulmonary process seen. Electronically Signed   By: Garald Balding M.D.   On: 06/08/2015 01:09   I have personally reviewed and evaluated these images and lab results as part of my medical decision-making.   EKG Interpretation   Date/Time:  Friday June 07 2015 22:14:04 EST Ventricular Rate:  73 PR Interval:  137 QRS Duration: 89 QT Interval:  393 QTC Calculation: 433 R  Axis:   24 Text Interpretation:  Sinus rhythm Baseline wander When compared with ECG  of 05/12/2014 Normal sinus rhythm has replaced Atrial fibrillation  Confirmed by [facility]  MD, Nunzio Cory [phone]) on 06/07/2015 10:23:05 PM      MDM   Final diagnoses:  Gastroenteritis  Asthma exacerbation    Pt was given IV fluids while here, also given  zofran with no further emesis while here. She felt improved at time of dc.  She tolerate po fluids and snack while here.  Labs and imaging reviewed.  CXR clear.  Given albuterol/atrovent neb with improved wheezing. Pt was prescribed zofran, imodium prn diarrhea.  Also placed on prednisone taper for asthma flare.  Prn f/u with pcp or return here for any worsened or persistent sx.    Evalee Jefferson, PA-C 06/08/15 0234  Rolland Porter, MD 06/08/15 [phone]

## 2015-06-08 NOTE — Discharge Instructions (Signed)

## 2015-06-21 ENCOUNTER — Encounter (HOSPITAL_COMMUNITY): Payer: Self-pay | Admitting: Emergency Medicine

## 2015-06-21 ENCOUNTER — Emergency Department (HOSPITAL_COMMUNITY)
Admission: EM | Admit: 2015-06-21 | Discharge: 2015-06-21 | Disposition: A | Discharge status: Home / Self Care | Payer: Medicare Other | Attending: Emergency Medicine | Admitting: Emergency Medicine

## 2015-06-21 DIAGNOSIS — E785 Hyperlipidemia, unspecified: Secondary | ICD-10-CM | POA: Diagnosis not present

## 2015-06-21 DIAGNOSIS — Z79899 Other long term (current) drug therapy: Secondary | ICD-10-CM | POA: Diagnosis not present

## 2015-06-21 DIAGNOSIS — Z792 Long term (current) use of antibiotics: Secondary | ICD-10-CM | POA: Insufficient documentation

## 2015-06-21 DIAGNOSIS — Z7901 Long term (current) use of anticoagulants: Secondary | ICD-10-CM | POA: Diagnosis not present

## 2015-06-21 DIAGNOSIS — J45901 Unspecified asthma with (acute) exacerbation: Secondary | ICD-10-CM | POA: Diagnosis not present

## 2015-06-21 DIAGNOSIS — I10 Essential (primary) hypertension: Secondary | ICD-10-CM | POA: Diagnosis not present

## 2015-06-21 DIAGNOSIS — Z791 Long term (current) use of non-steroidal anti-inflammatories (NSAID): Secondary | ICD-10-CM | POA: Diagnosis not present

## 2015-06-21 DIAGNOSIS — J45909 Unspecified asthma, uncomplicated: Secondary | ICD-10-CM | POA: Diagnosis present

## 2015-06-21 HISTORY — DX: Headache: R51

## 2015-06-21 HISTORY — DX: Radiculopathy, lumbar region: M54.16

## 2015-06-21 HISTORY — DX: Other diseases of vocal cords: J38.3

## 2015-06-21 HISTORY — DX: Headache, unspecified: R51.9

## 2015-06-21 HISTORY — DX: Other chronic pain: G89.29

## 2015-06-21 HISTORY — DX: Dorsalgia, unspecified: M54.9

## 2015-06-21 MED ORDER — IPRATROPIUM-ALBUTEROL 0.5-2.5 (3) MG/3ML IN SOLN
3.0000 mL | Freq: Once | RESPIRATORY_TRACT | Status: AC
  Administered 2015-06-21: 3 mL via RESPIRATORY_TRACT
  Filled 2015-06-21: qty 3

## 2015-06-21 MED ORDER — ALBUTEROL SULFATE (2.5 MG/3ML) 0.083% IN NEBU
2.5000 mg | INHALATION_SOLUTION | Freq: Once | RESPIRATORY_TRACT | Status: AC
Start: 2015-06-21 — End: 2015-06-21
  Administered 2015-06-21: 2.5 mg via RESPIRATORY_TRACT
  Filled 2015-06-21: qty 3

## 2015-06-21 MED ORDER — PREDNISONE 50 MG PO TABS
60.0000 mg | ORAL_TABLET | Freq: Once | ORAL | Status: AC
  Administered 2015-06-21: 60 mg via ORAL
  Filled 2015-06-21: qty 1

## 2015-06-21 MED ORDER — PREDNISONE 20 MG PO TABS: 40.0000 mg | ORAL_TABLET | Freq: Every day | ORAL | Status: DC

## 2015-06-21 NOTE — Discharge Instructions (Signed)
Take the prescription as directed.  Use your albuterol inhaler (2 to 4 puffs) or your albuterol nebulizer (1 unit dose) every 4 hours for the next 7 days, then as needed for cough, wheezing, or shortness of breath.  Call your regular medical or Pulmonary doctor today to schedule a follow up appointment within the next 2 to 3 days.  Return to the Emergency Department immediately sooner if worsening.

## 2015-06-21 NOTE — ED Provider Notes (Signed)
CSN: [id number]     Arrival date & time 06/21/15  1017 History   First MD Initiated Contact with Patient 06/21/15 1059     Chief Complaint  Patient presents with  . Asthma      HPI Pt was seen at 1100.  Per pt, c/o gradual onset and worsening of persistent wheezing and SOB since last night.  Describes her symptoms as "my asthma is acting up."  Has been using home MDI and nebs with transient relief.  Denies CP/palpitations, no back pain, no abd pain, no N/V/D, no fevers, no rash. Pt denies any other complaints.   Past Medical History  Diagnosis Date  . Asthma   . Hyperlipidemia   . Hypertension   . Syncope and collapse 05/11/11  adrenal insuffiency  treated at [facility]  . Thyroid disease   . Headache   . Vocal cord dysfunction   . Chronic back pain   . Lumbar radiculopathy    Past Surgical History  Procedure Laterality Date  . Cardiac catheterization  2012 at [facility]  . 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 Abdominal hysterectomy    . Cholecystectomy, laparoscopic    . Eye surg for glaucoma    . Right knee surg    . Cholecystectomy     Family History  Problem Relation Age of Onset  . Heart attack Mother   . Hypertension Mother   . Heart attack Father   . Asthma Father   . Hyperlipidemia Father   . Hypertension Father   . Heart attack Sister   . Arrhythmia Sister   . Asthma Sister   . Hyperlipidemia Sister   . Hypertension Sister   . Asthma Brother   . Hypertension Brother    Social History  Substance Use Topics  . Smoking status: Never Smoker   . Smokeless tobacco: Never Used  . Alcohol Use: No    Review of Systems ROS: Statement: All systems negative except as marked or noted in the HPI; Constitutional: Negative for fever and chills. ; ; Eyes: Negative for eye pain, redness and discharge. ; ; ENMT: Negative for ear pain, hoarseness, nasal congestion, sinus pressure and sore throat. ; ; Cardiovascular: Negative for chest pain, palpitations,  diaphoresis, and peripheral edema. ; ; Respiratory: +wheezing, SOB. Negative for cough and stridor. ; ; Gastrointestinal: Negative for nausea, vomiting, diarrhea, abdominal pain, blood in stool, hematemesis, jaundice and rectal bleeding. . ; ; Genitourinary: Negative for dysuria, flank pain and hematuria. ; ; Musculoskeletal: Negative for back pain and neck pain. Negative for swelling and trauma.; ; Skin: Negative for pruritus, rash, abrasions, blisters, bruising and skin lesion.; ; Neuro: Negative for headache, lightheadedness and neck stiffness. Negative for weakness, altered level of consciousness , altered mental status, extremity weakness, paresthesias, involuntary movement, seizure and syncope.      Allergies  Aspirin; Codeine sulfate; Darvocet; Tramadol; and Penicillins  Home Medications   Prior to Admission medications   Medication Sig Start Date End Date Taking? Authorizing Provider  albuterol (PROVENTIL HFA;VENTOLIN HFA) 108 (90 BASE) MCG/ACT inhaler Inhale 2 puffs into the lungs every 4 (four) hours as needed for wheezing.     Historical Provider, MD  albuterol (PROVENTIL) (2.5 MG/3ML) 0.083% nebulizer solution Take 2.5 mg by nebulization every 6 (six) hours as needed for wheezing or shortness of breath.    Historical Provider, MD  aluminum chloride (DRYSOL) 20 % external solution Apply 1 application topically at bedtime as needed (  for sweating).     Historical Provider, MD  atorvastatin (LIPITOR) 20 MG tablet Take 20 mg by mouth every evening.     Historical Provider, MD  brimonidine (ALPHAGAN) 0.15 % ophthalmic solution Place 1 drop into both eyes 2 (two) times daily.     Historical Provider, MD  calcium citrate (CALCITRATE - DOSED IN MG ELEMENTAL CALCIUM) 950 MG tablet Take 1 tablet by mouth daily.    Historical Provider, MD  cetirizine (ZYRTEC) 10 MG tablet Take 10 mg by mouth at bedtime.    Historical Provider, MD  Cholecalciferol (VITAMIN D) 2000 UNITS tablet Take 2,000 Units by  mouth daily.    Historical Provider, MD  citalopram (CELEXA) 20 MG tablet Take 20 mg by mouth at bedtime.     Historical Provider, MD  clindamycin (CLEOCIN T) 1 % external solution Apply 1 application topically every evening.  11/08/09   Historical Provider, MD  clobetasol cream (TEMOVATE) [id number] % Apply 1 application topically every evening.  10/28/09   Historical Provider, MD  cyclobenzaprine (FLEXERIL) 5 MG tablet Take 5 mg by mouth 3 (three) times daily as needed for muscle spasms. Takes for muscle spasms 11/09/09   Historical Provider, MD  DALIRESP 500 MCG TABS tablet Take 500 mg by mouth daily. 06/04/15   Historical Provider, MD  esomeprazole (NEXIUM) 40 MG capsule Take 40 mg by mouth daily before breakfast.    Historical Provider, MD  fluticasone (FLONASE) 50 MCG/ACT nasal spray Place 2 sprays into the nose daily as needed for allergies. One spray in each nostril    Historical Provider, MD  fluticasone (FLOVENT HFA) 220 MCG/ACT inhaler Inhale 2 puffs into the lungs every 4 (four) hours. 06/24/09   Historical Provider, MD  Fluticasone-Salmeterol (ADVAIR DISKUS) 250-50 MCG/DOSE AEPB Inhale 1 puff into the lungs 2 (two) times daily. 06/23/09   Historical Provider, MD  gabapentin (NEURONTIN) 300 MG capsule Take 900 mg by mouth 3 (three) times daily.     Historical Provider, MD  latanoprost (XALATAN) 0.005 % ophthalmic solution Place 1 drop into both eyes at bedtime.    Historical Provider, MD  levocetirizine (XYZAL) 5 MG tablet Take 5 mg by mouth every evening. 06/04/15   Historical Provider, MD  lisinopril-hydrochlorothiazide (PRINZIDE,ZESTORETIC) 20-25 MG per tablet Take 1 tablet by mouth daily.    Historical Provider, MD  loperamide (IMODIUM) 2 MG capsule Take 1 capsule (2 mg total) by mouth 4 (four) times daily as needed for diarrhea or loose stools. 06/08/15   Evalee Jefferson, PA-C  montelukast (SINGULAIR) 10 MG tablet Take 10 mg by mouth at bedtime.    Historical Provider, MD  Multiple Vitamin  (MULTIVITAMIN) capsule Take 1 capsule by mouth daily.    Historical Provider, MD  nitroGLYCERIN (NITROSTAT) 0.4 MG SL tablet Place 0.4 mg under the tongue every 5 (five) minutes as needed for chest pain.    Historical Provider, MD  ondansetron (ZOFRAN ODT) 8 MG disintegrating tablet Take 1 tablet (8 mg total) by mouth every 8 (eight) hours as needed for nausea or vomiting. 06/08/15   Evalee Jefferson, PA-C  phentermine 15 MG capsule Take 15 mg by mouth daily as needed (weight loss).     Historical Provider, MD  potassium chloride SA (K-DUR,KLOR-CON) 20 MEQ tablet Take 20 mEq by mouth 2 (two) times daily.    Historical Provider, MD  predniSONE (DELTASONE) 10 MG tablet 6, 5, 4, 3, 2 then 1 tablet by mouth daily for 6 days total. 06/08/15   Evalee Jefferson,  PA-C  promethazine (PHENERGAN) 25 MG tablet Take 25 mg by mouth every 6 (six) hours as needed. 06/26/09   Historical Provider, MD  rivaroxaban (XARELTO) 20 MG TABS tablet Take 1 tablet (20 mg total) by mouth daily with supper. 05/13/14   Modena Jansky, MD  tiotropium (SPIRIVA HANDIHALER) 18 MCG inhalation capsule Place 1 capsule into inhaler and inhale daily. 12/09/09   Historical Provider, MD   BP 125/90 mmHg  Pulse 76  Temp(Src) 98 F (36.7 C) (Oral)  Resp 18  Ht 5\' 2"  (1.575 m)  Wt 253 lb (114.76 kg)  BMI 46.26 kg/m2  SpO2 100% Physical Exam  1105: Physical examination:  Nursing notes reviewed; Vital signs and O2 SAT reviewed;  Constitutional: Well developed, Well nourished, Well hydrated, In no acute distress; Head:  Normocephalic, atraumatic; Eyes: EOMI, PERRL, No scleral icterus; ENMT: Mouth and pharynx normal, Mucous membranes moist; Neck: Supple, Full range of motion, No lymphadenopathy; Cardiovascular: Regular rate and rhythm, No gallop; Respiratory: Breath sounds coarse & equal bilaterally, scattered exp wheezes. No audible wheezing. Speaking full sentences with ease, Normal respiratory effort/excursion; Chest: Nontender, Movement normal; Abdomen:  Soft, Nontender, Nondistended, Normal bowel sounds; Genitourinary: No CVA tenderness; Extremities: Pulses normal, No tenderness, No edema, No calf edema or asymmetry.; Neuro: AA&Ox3, Major CN grossly intact. No facial droop. Speech clear. No gross focal motor or sensory deficits in extremities. Climbs on and off stretcher easily by herself. Gait steady.; Skin: Color normal, Warm, Dry.   ED Course  Procedures (including critical care time) Labs Review  Imaging Review  I have personally reviewed and evaluated these images and lab results as part of my medical decision-making.   EKG Interpretation None      MDM  MDM Reviewed: previous chart, nursing note and vitals     1245:  Pt states she "feels better" after neb and steroid.  NAD, lungs CTA bilat, no wheezing, resps easy, speaking full sentences, Sats 100% R/A.  Pt states she is ready to go home now. Tx symptomatically, f/u PMD and Pulm MD. Dx d/w pt.  Questions answered.  Verb understanding, agreeable to d/c home with outpt f/u.   Francine Graven, DO 06/24/15 1627

## 2015-06-21 NOTE — ED Notes (Signed)
Patient with headache x 1 week and increased work of breathing/shortness of breath x 1 day. States up most of night. Patient states taking tylenol and gabapentin for headache without relief. States neb treatment PTA for breathing with mild improvement.

## 2015-08-26 ENCOUNTER — Emergency Department (HOSPITAL_COMMUNITY)
Admission: EM | Admit: 2015-08-26 | Discharge: 2015-08-26 | Disposition: A | Discharge status: Home / Self Care | Payer: Medicare Other | Attending: Emergency Medicine | Admitting: Emergency Medicine

## 2015-08-26 ENCOUNTER — Telehealth: Payer: Self-pay | Admitting: General Practice

## 2015-08-26 ENCOUNTER — Encounter (HOSPITAL_COMMUNITY): Payer: Self-pay

## 2015-08-26 DIAGNOSIS — J449 Chronic obstructive pulmonary disease, unspecified: Secondary | ICD-10-CM | POA: Insufficient documentation

## 2015-08-26 DIAGNOSIS — I10 Essential (primary) hypertension: Secondary | ICD-10-CM | POA: Diagnosis not present

## 2015-08-26 DIAGNOSIS — R42 Dizziness and giddiness: Secondary | ICD-10-CM | POA: Insufficient documentation

## 2015-08-26 DIAGNOSIS — I4891 Unspecified atrial fibrillation: Secondary | ICD-10-CM | POA: Diagnosis not present

## 2015-08-26 DIAGNOSIS — J45909 Unspecified asthma, uncomplicated: Secondary | ICD-10-CM | POA: Diagnosis not present

## 2015-08-26 DIAGNOSIS — R110 Nausea: Secondary | ICD-10-CM | POA: Diagnosis not present

## 2015-08-26 DIAGNOSIS — E785 Hyperlipidemia, unspecified: Secondary | ICD-10-CM | POA: Insufficient documentation

## 2015-08-26 DIAGNOSIS — Z79899 Other long term (current) drug therapy: Secondary | ICD-10-CM | POA: Insufficient documentation

## 2015-08-26 DIAGNOSIS — R109 Unspecified abdominal pain: Secondary | ICD-10-CM | POA: Diagnosis not present

## 2015-08-26 DIAGNOSIS — K921 Melena: Secondary | ICD-10-CM | POA: Insufficient documentation

## 2015-08-26 HISTORY — DX: Unspecified atrial fibrillation: I48.91

## 2015-08-26 HISTORY — DX: Disorder of adrenal gland, unspecified: E27.9

## 2015-08-26 HISTORY — DX: Chronic obstructive pulmonary disease, unspecified: J44.9

## 2015-08-26 LAB — LIPASE, BLOOD: LIPASE: 12 U/L (ref 11–51)

## 2015-08-26 LAB — CBC
HEMATOCRIT: 41.2 % (ref 36.0–46.0)
Hemoglobin: 13.1 g/dL (ref 12.0–15.0)
MCH: 28.5 pg (ref 26.0–34.0)
MCHC: 31.8 g/dL (ref 30.0–36.0)
MCV: 89.6 fL (ref 78.0–100.0)
Platelets: 277 10*3/uL (ref 150–400)
RBC: 4.6 MIL/uL (ref 3.87–5.11)
RDW: 14.4 % (ref 11.5–15.5)
WBC: 7.2 10*3/uL (ref 4.0–10.5)

## 2015-08-26 LAB — URINE MICROSCOPIC-ADD ON

## 2015-08-26 LAB — COMPREHENSIVE METABOLIC PANEL
ALBUMIN: 3.4 g/dL — AB (ref 3.5–5.0)
ALT: 18 U/L (ref 14–54)
AST: 36 U/L (ref 15–41)
Alkaline Phosphatase: 61 U/L (ref 38–126)
Anion gap: 8 (ref 5–15)
BUN: 33 mg/dL — AB (ref 6–20)
CHLORIDE: 99 mmol/L — AB (ref 101–111)
CO2: 32 mmol/L (ref 22–32)
Calcium: 8.6 mg/dL — ABNORMAL LOW (ref 8.9–10.3)
Creatinine, Ser: 1.4 mg/dL — ABNORMAL HIGH (ref 0.44–1.00)
GFR calc Af Amer: 47 mL/min — ABNORMAL LOW (ref >60)
GFR, EST NON AFRICAN AMERICAN: 41 mL/min — AB (ref >60)
Glucose, Bld: 88 mg/dL (ref 65–99)
POTASSIUM: 3 mmol/L — AB (ref 3.5–5.1)
SODIUM: 139 mmol/L (ref 135–145)
Total Bilirubin: 0.6 mg/dL (ref 0.3–1.2)
Total Protein: 6.1 g/dL — ABNORMAL LOW (ref 6.5–8.1)

## 2015-08-26 LAB — URINALYSIS, ROUTINE W REFLEX MICROSCOPIC
GLUCOSE, UA: NEGATIVE mg/dL
HGB URINE DIPSTICK: NEGATIVE
Nitrite: NEGATIVE
PH: 6 (ref 5.0–8.0)
Protein, ur: 30 mg/dL — AB
Specific Gravity, Urine: 1.02 (ref 1.005–1.030)

## 2015-08-26 LAB — POC OCCULT BLOOD, ED: FECAL OCCULT BLD: NEGATIVE

## 2015-08-26 MED ORDER — HYDROCODONE-ACETAMINOPHEN 5-325 MG PO TABS: 1.0000 | ORAL_TABLET | Freq: Four times a day (QID) | ORAL | Status: DC | PRN

## 2015-08-26 NOTE — Telephone Encounter (Signed)
Re; Melena and Heme(-)

## 2015-08-26 NOTE — Telephone Encounter (Signed)
MADE APPT AND LEFT MESSAGE ON PATIENT PHONE

## 2015-08-26 NOTE — ED Notes (Signed)
MD at bedside. 

## 2015-08-26 NOTE — Telephone Encounter (Signed)
Per Dr. Gala Romney he received a consult from the ER doctor at [facility].  He stated we need to see this patient in the office this week as a new patient.  Routing to [place] to schedule

## 2015-08-26 NOTE — Discharge Instructions (Signed)
Dr. Roseanne Kaufman office should call you this afternoon to arrange a follow-up appointment. If you do not hear from them call them in the morning to make these arrangements.  Return to the emergency department if your symptoms significantly worsen or change.   Gastrointestinal Bleeding Gastrointestinal (GI) bleeding means there is bleeding somewhere along the digestive tract, between the mouth and anus. CAUSES  There are many different problems that can cause GI bleeding. Possible causes include:  Esophagitis. This is inflammation, irritation, or swelling of the esophagus.  Hemorrhoids.These are veins that are full of blood (engorged) in the rectum. They cause pain, inflammation, and may bleed.  Anal fissures.These are areas of painful tearing which may bleed. They are often caused by passing hard stool.  Diverticulosis.These are pouches that form on the colon over time, with age, and may bleed significantly.  Diverticulitis.This is inflammation in areas with diverticulosis. It can cause pain, fever, and bloody stools, although bleeding is rare.  Polyps and cancer. Colon cancer often starts out as precancerous polyps.  Gastritis and ulcers.Bleeding from the upper gastrointestinal tract (near the stomach) may travel through the intestines and produce black, sometimes tarry, often bad smelling stools. In certain cases, if the bleeding is fast enough, the stools may not be black, but red. This condition may be life-threatening. SYMPTOMS   Vomiting bright red blood or material that looks like coffee grounds.  Bloody, black, or tarry stools. DIAGNOSIS  Your caregiver may diagnose your condition by taking your history and performing a physical exam. More tests may be needed, including:  X-rays and other imaging tests.  Esophagogastroduodenoscopy (EGD). This test uses a flexible, lighted tube to look at your esophagus, stomach, and small intestine.  Colonoscopy. This test uses a flexible,  lighted tube to look at your colon. TREATMENT  Treatment depends on the cause of your bleeding.   For bleeding from the esophagus, stomach, small intestine, or colon, the caregiver doing your EGD or colonoscopy may be able to stop the bleeding as part of the procedure.  Inflammation or infection of the colon can be treated with medicines.  Many rectal problems can be treated with creams, suppositories, or warm baths.  Surgery is sometimes needed.  Blood transfusions are sometimes needed if you have lost a lot of blood. If bleeding is slow, you may be allowed to go home. If there is a lot of bleeding, you will need to stay in the hospital for observation. HOME CARE INSTRUCTIONS   Take any medicines exactly as prescribed.  Keep your stools soft by eating foods that are high in fiber. These foods include whole grains, legumes, fruits, and vegetables. Prunes (1 to 3 a day) work well for many people.  Drink enough fluids to keep your urine clear or pale yellow. SEEK IMMEDIATE MEDICAL CARE IF:   Your bleeding increases.  You feel lightheaded, weak, or you faint.  You have severe cramps in your back or abdomen.  You pass large blood clots in your stool.  Your problems are getting worse. MAKE SURE YOU:   Understand these instructions.  Will watch your condition.  Will get help right away if you are not doing well or get worse.   This information is not intended to replace advice given to you by your health care provider. Make sure you discuss any questions you have with your health care provider.   Document Released: 03/27/2000 Document Revised: 03/16/2012 Document Reviewed: 09/17/2014 Elsevier Interactive Patient Education [employer].

## 2015-08-26 NOTE — ED Notes (Addendum)
Pt reports she has noticed black stools for 2 weeks. Past 2 days has worsening with nausea, abdominal pain. Denies vomiting and frequent heartburn. Reports she drank vinegar and water with relief. Pt is on xarelto

## 2015-08-26 NOTE — ED Provider Notes (Signed)
CSN: [id number]     Arrival date & time 08/26/15  [id number] History  By signing my name below, I, Deborah Murray, attest that this documentation has been prepared under the direction and in the presence of Deborah Speak, MD. Electronically Signed: Randa Murray, ED Scribe. 08/26/2015. 9:41 AM.     Chief Complaint  Patient presents with  . Melena   The history is provided by the patient. No language interpreter was used.   HPI Comments: Deborah Murray is a 55 y.o. female who presents to the Emergency Department complaining of blood in her stool onset 2 weeks prior that has recently worsened over the last 2 days. Pt states she has had associated abdominal pain, nausea and dizziness. Pt states that she is having black and  tarry stools. Pt reports that she is currently taking Xarelto. Pt also reports that she is slightly SOB and is having frequent heart burn. Pt denies being on iron supplements. PT denies vomiting or other related symptoms.    Past Medical History  Diagnosis Date  . Asthma   . Hyperlipidemia   . Hypertension   . Syncope and collapse 05/11/11  adrenal insuffiency  treated at [facility]  . Thyroid disease   . Headache   . Vocal cord dysfunction   . Chronic back pain   . Lumbar radiculopathy   . 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 COPD (chronic obstructive pulmonary disease) ([place])   . Atrial fibrillation [facility])    Past Surgical History  Procedure Laterality Date  . Cardiac catheterization  2012 at [facility]  . 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 Abdominal hysterectomy    . Cholecystectomy, laparoscopic    . Eye surg for glaucoma    . Right knee surg    . Cholecystectomy     Family History  Problem Relation Age of Onset  . Heart attack Mother   . Hypertension Mother   . Heart attack Father   . Asthma Father   . Hyperlipidemia Father   . Hypertension Father   . Heart attack Sister   . Arrhythmia Sister   . Asthma Sister   . Hyperlipidemia Sister   .  Hypertension Sister   . Asthma Brother   . Hypertension Brother    Social History  Substance Use Topics  . Smoking status: Never Smoker   . Smokeless tobacco: Never Used  . Alcohol Use: No   OB History    No data available      Review of Systems  Gastrointestinal: Positive for nausea, abdominal pain and blood in stool. Negative for vomiting.  Neurological: Positive for dizziness.  All other systems reviewed and are negative.     Allergies  Aspirin; Codeine sulfate; Darvocet; Tramadol; and Penicillins  Home Medications   Prior to Admission medications   Medication Sig Start Date End Date Taking? Authorizing Provider  albuterol (PROVENTIL HFA;VENTOLIN HFA) 108 (90 BASE) MCG/ACT inhaler Inhale 2 puffs into the lungs every 4 (four) hours as needed for wheezing.     Historical Provider, MD  albuterol (PROVENTIL) (2.5 MG/3ML) 0.083% nebulizer solution Take 2.5 mg by nebulization every 6 (six) hours as needed for wheezing or shortness of breath.    Historical Provider, MD  aluminum chloride (DRYSOL) 20 % external solution Apply 1 application topically at bedtime as needed (for sweating and applies under arms).     Historical Provider, MD  atorvastatin (LIPITOR) 20 MG tablet Take 20 mg by mouth  every evening.     Historical Provider, MD  brimonidine (ALPHAGAN) 0.15 % ophthalmic solution Place 1 drop into both eyes 2 (two) times daily.     Historical Provider, MD  calcium citrate (CALCITRATE - DOSED IN MG ELEMENTAL CALCIUM) 950 MG tablet Take 1 tablet by mouth daily.    Historical Provider, MD  cetirizine (ZYRTEC) 10 MG tablet Take 10 mg by mouth at bedtime.    Historical Provider, MD  Cholecalciferol (VITAMIN D) 2000 UNITS tablet Take 2,000 Units by mouth daily.    Historical Provider, MD  citalopram (CELEXA) 20 MG tablet Take 20 mg by mouth at bedtime.     Historical Provider, MD  clindamycin (CLEOCIN T) 1 % external solution Apply 1 application topically every evening. Applies to  underarms 11/08/09   Historical Provider, MD  EPINEPHrine (EPIPEN JR) 0.15 MG/0.3ML injection Inject 0.15 mg into the muscle Once PRN. 06/11/15   Historical Provider, MD  esomeprazole (NEXIUM) 40 MG capsule Take 40 mg by mouth daily before breakfast.    Historical Provider, MD  fluticasone (FLONASE) 50 MCG/ACT nasal spray Place 1 spray into both nostrils daily. One spray in each nostril    Historical Provider, MD  fluticasone (FLOVENT HFA) 220 MCG/ACT inhaler Inhale 2 puffs into the lungs every 4 (four) hours. 06/24/09   Historical Provider, MD  gabapentin (NEURONTIN) 300 MG capsule Take 900 mg by mouth 3 (three) times daily.     Historical Provider, MD  ipratropium (ATROVENT) 0.02 % nebulizer solution Inhale 2 mLs into the lungs 4 (four) times daily. 06/16/15   Historical Provider, MD  latanoprost (XALATAN) 0.005 % ophthalmic solution Place 1 drop into both eyes at bedtime.    Historical Provider, MD  levocetirizine (XYZAL) 5 MG tablet Take 5 mg by mouth every evening. 06/04/15   Historical Provider, MD  lisinopril-hydrochlorothiazide (PRINZIDE,ZESTORETIC) 20-25 MG per tablet Take 1 tablet by mouth daily.    Historical Provider, MD  loperamide (IMODIUM) 2 MG capsule Take 1 capsule (2 mg total) by mouth 4 (four) times daily as needed for diarrhea or loose stools. 06/08/15   Evalee Jefferson, PA-C  montelukast (SINGULAIR) 10 MG tablet Take 10 mg by mouth at bedtime.    Historical Provider, MD  Multiple Vitamin (MULTIVITAMIN) capsule Take 1 capsule by mouth daily.    Historical Provider, MD  nitroGLYCERIN (NITROSTAT) 0.4 MG SL tablet Place 0.4 mg under the tongue every 5 (five) minutes as needed for chest pain.    Historical Provider, MD  omalizumab Arvid Right) 150 MG injection Inject 300 mg into the skin. 06/11/15   Historical Provider, MD  ondansetron (ZOFRAN ODT) 8 MG disintegrating tablet Take 1 tablet (8 mg total) by mouth every 8 (eight) hours as needed for nausea or vomiting. 06/08/15   Evalee Jefferson, PA-C   phentermine 15 MG capsule Take 15 mg by mouth daily.     Historical Provider, MD  potassium chloride SA (K-DUR,KLOR-CON) 20 MEQ tablet Take 20 mEq by mouth 2 (two) times daily.    Historical Provider, MD  predniSONE (DELTASONE) 20 MG tablet Take 2 tablets (40 mg total) by mouth daily. 06/21/15   Francine Graven, DO  promethazine (PHENERGAN) 25 MG tablet Take 25 mg by mouth every 6 (six) hours as needed. 06/26/09   Historical Provider, MD  rivaroxaban (XARELTO) 20 MG TABS tablet Take 1 tablet (20 mg total) by mouth daily with supper. 05/13/14   Modena Jansky, MD  tiotropium (SPIRIVA HANDIHALER) 18 MCG inhalation capsule Place 1 capsule  into inhaler and inhale daily. 12/09/09   Historical Provider, MD    BP 140/104 mmHg  Pulse 75  Temp(Src) 98.1 F (36.7 C) (Oral)  Resp 20  Ht 5\' 2"  (1.575 m)  Wt 240 lb (108.863 kg)  BMI 43.89 kg/m2  SpO2 100%    Physical Exam  Constitutional: She is oriented to person, place, and time. She appears well-developed and well-nourished. No distress.  HENT:  Head: Normocephalic and atraumatic.  Eyes: Conjunctivae and EOM are normal.  Neck: Neck supple. No tracheal deviation present.  Cardiovascular: Normal rate.   Pulmonary/Chest: Effort normal. No respiratory distress.  Genitourinary:  Normal rectal exam. Brown stool present. Chaperone present.   Musculoskeletal: Normal range of motion.  Neurological: She is alert and oriented to person, place, and time.  Skin: Skin is warm and dry.  Psychiatric: She has a normal mood and affect. Her behavior is normal.  Nursing note and vitals reviewed.   ED Course  Procedures (including critical care time) DIAGNOSTIC STUDIES: Oxygen Saturation is 100% on RA, normal by my interpretation.    COORDINATION OF CARE: 9:41 AM-Discussed treatment plan with pt at bedside and pt agreed to plan.     Labs Review Labs Reviewed  LIPASE, BLOOD  COMPREHENSIVE METABOLIC PANEL  CBC  URINALYSIS, ROUTINE W REFLEX  MICROSCOPIC (NOT AT [facility])  POC OCCULT BLOOD, ED    Imaging Review No results found. I have personally reviewed and evaluated these lab results as part of my medical decision-making.    MDM   Final diagnoses:  None   Patient presents here with complaints of intermittent melena for the past 2 weeks. Her stools are heme-negative here in the ER and hemoglobin is 13. There are no signs of active bleeding. She is hemodynamically stable. I have discussed this case with Dr. Gala Romney from gastroenterology who feels as though outpatient follow-up is appropriate. His office will call her this afternoon to arrange this appointment for some day this week. The patient understands to return if her symptoms significantly worsen or change.   I personally performed the services described in this documentation, which was scribed in my presence. The recorded information has been reviewed and is accurate.         Deborah Speak, MD 08/26/15 [phone]

## 2015-08-26 NOTE — ED Notes (Signed)
Dr. Stark Jock informed of pt's request for pain medication.

## 2015-08-28 ENCOUNTER — Ambulatory Visit (INDEPENDENT_AMBULATORY_CARE_PROVIDER_SITE_OTHER): Payer: Medicare Other | Admitting: Nurse Practitioner

## 2015-08-28 ENCOUNTER — Encounter: Payer: Self-pay | Admitting: Nurse Practitioner

## 2015-08-28 ENCOUNTER — Other Ambulatory Visit: Payer: Self-pay

## 2015-08-28 VITALS — BP 99/67 | HR 60 | Temp 97.1°F | Ht 62.0 in | Wt 247.8 lb

## 2015-08-28 DIAGNOSIS — K219 Gastro-esophageal reflux disease without esophagitis: Secondary | ICD-10-CM | POA: Insufficient documentation

## 2015-08-28 DIAGNOSIS — R110 Nausea: Secondary | ICD-10-CM | POA: Diagnosis not present

## 2015-08-28 DIAGNOSIS — R1013 Epigastric pain: Secondary | ICD-10-CM | POA: Insufficient documentation

## 2015-08-28 DIAGNOSIS — R195 Other fecal abnormalities: Secondary | ICD-10-CM | POA: Diagnosis not present

## 2015-08-28 MED ORDER — ONDANSETRON HCL 4 MG PO TABS: 4.0000 mg | ORAL_TABLET | Freq: Three times a day (TID) | ORAL | Status: DC | PRN

## 2015-08-28 NOTE — Patient Instructions (Signed)
1. Stop taking omeprazole and Nexium. I will give the samples of Dexilant to take for 2 weeks.  2. We will schedule your procedure for you. 3. We will request your previous procedure reports from [place] at [facility]. 4. We will contact your provider who manages your Xarelto to ask if it is okay to hold for 24 hours prior to the procedure. 5. In the meantime we will schedule a tentative date. 6. Return for follow-up in 3 months.

## 2015-08-28 NOTE — Assessment & Plan Note (Signed)
GERD symptoms include nausea without vomiting. I will send an Zofran to use during the day as Phenergan tends to make her sleepy and she only uses at night. Return for follow-up in 3 months.

## 2015-08-28 NOTE — Assessment & Plan Note (Addendum)
Patient with long-standing history of GERD who is been on Nexium for number of years. She's had worsening symptoms including epigastric pain, esophageal burning, nausea which prompted her to present to both urgent care and emergency room. She was tried on Prilosec which was not effective. This point I will give her samples of Dexilant and refer her for upper endoscopy to further evaluate for persistent GERD on PPI.  Proceed with EGD with Dr. Gala Romney in near future: the risks, benefits, and alternatives have been discussed with the patient in detail. The patient states understanding and desires to proceed.  The patient is on Phenergan, Ultram, Neurontin, Cymbalta, Celexa. She is also on Xarelto. We will provide 12.5 mg preprocedure Phenergan to promote adequate sedation.  We will contact her provider manages her Xarelto to ask if it is okay to hold for 24 hours prior to procedure to reduce risk of bleeding.

## 2015-08-28 NOTE — Progress Notes (Signed)
Primary Care Physician:  Sol Passer, MD Primary Gastroenterologist:  Dr. Gala Romney  Chief Complaint  Patient presents with  . Melena    HPI:   Deborah Murray is a 55 y.o. female who presents on referral from the emergency department for melena and heme-negative stool. The patient was seen in the emergency department 08/26/2015 for complaints of blood in her stool 2 weeks prior that has worsened over the previous 2 days. Also complained of associated abdominal pain, nausea, dizziness. States stools are black and tarry, currently taking Xarelto. Also noted some shortness of breath and frequent heartburn. Denies iron supplementation at that time. Rectal exam was normal with brown stool noted. Stools are heme negative and hemoglobin was 13, no signs of active bleeding. No previous colonoscopy or endoscopy our system. Review of historical labs back through 2014 do not show any anemia.  Today she confirms the above history. Saw urgent care 2 weeks prior for the same and was given Tetracycline. No recurrent dark stools since ER discharge. Is having persistent nausea, worse after eating. No vomiting. Denies hematochezia. Her GERD symptoms include epigastric pain, esophageal burning, bitter taste, and nausea. Has tried promethazine (which makes her sleepy), is also on Nexium (which she has been on for years.) She stopped nexium per instructions from urgent care and trialed on omeprazole, which has not helped. Also taking pepto bismol and maalox,. Last pepto use was Monday but was not taking that when she went to the ER. Denies chest pain, dyspnea, syncope, near syncope. Denies any other upper or lower GI symptoms.  Last colonoscopy 3-4 years ago at [facility]. Last EGD at least 5-6 years ago.  Past Medical History  Diagnosis Date  . Asthma   . Hyperlipidemia   . Hypertension   . Syncope and collapse 05/11/11  adrenal insuffiency  treated at [facility]  . Thyroid disease   . Headache   . Vocal  cord dysfunction   . Chronic back pain   . Lumbar radiculopathy   . 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 COPD (chronic obstructive pulmonary disease) ([place])   . Atrial fibrillation [facility])     Past Surgical History  Procedure Laterality Date  . Cardiac catheterization  2012 at [facility]  . 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 Abdominal hysterectomy    . Cholecystectomy, laparoscopic    . Eye surg for glaucoma    . Right knee surg    . Cholecystectomy      Current Outpatient Prescriptions  Medication Sig Dispense Refill  . albuterol (PROVENTIL HFA;VENTOLIN HFA) 108 (90 BASE) MCG/ACT inhaler Inhale 2 puffs into the lungs every 4 (four) hours as needed for wheezing.     Marland Kitchen albuterol (PROVENTIL) (2.5 MG/3ML) 0.083% nebulizer solution Take 2.5 mg by nebulization every 6 (six) hours as needed for wheezing or shortness of breath.    Marland Kitchen aluminum chloride (DRYSOL) 20 % external solution Apply 1 application topically at bedtime as needed (for sweating and applies under arms).     Marland Kitchen atorvastatin (LIPITOR) 20 MG tablet Take 20 mg by mouth every evening.     . calcium citrate (CALCITRATE - DOSED IN MG ELEMENTAL CALCIUM) 950 MG tablet Take 1 tablet by mouth daily.    . Cholecalciferol (VITAMIN D) 2000 UNITS tablet Take 2,000 Units by mouth daily.    . ciprofloxacin (CIPRO) 500 MG tablet Take 1 tablet by mouth 2 (two) times daily.  0  .  citalopram (CELEXA) 20 MG tablet Take 20 mg by mouth at bedtime.     . clindamycin (CLEOCIN T) 1 % external solution Apply 1 application topically every evening. Applies to underarms    . DULoxetine (CYMBALTA) 30 MG capsule Take 1 capsule by mouth daily.  3  . EPINEPHrine (EPIPEN JR) 0.15 MG/0.3ML injection Inject 0.15 mg into the muscle Once PRN.    Marland Kitchen esomeprazole (NEXIUM) 40 MG capsule Take 40 mg by mouth 2 (two) times daily.     . fluticasone (FLONASE) 50 MCG/ACT nasal spray SHAKE LQ AND U 2 SPRAYS IEN D  5  . fluticasone (FLOVENT HFA) 220 MCG/ACT inhaler  Inhale 2 puffs into the lungs every 4 (four) hours.    . gabapentin (NEURONTIN) 300 MG capsule Take 900 mg by mouth 3 (three) times daily.     Marland Kitchen ipratropium (ATROVENT) 0.02 % nebulizer solution Inhale 2 mLs into the lungs 4 (four) times daily.  0  . latanoprost (XALATAN) 0.005 % ophthalmic solution Place 1 drop into both eyes at bedtime.    Marland Kitchen levocetirizine (XYZAL) 5 MG tablet Take 5 mg by mouth every evening.    Marland Kitchen lisinopril-hydrochlorothiazide (PRINZIDE,ZESTORETIC) 20-25 MG per tablet Take 1 tablet by mouth daily.    . metoprolol tartrate (LOPRESSOR) 25 MG tablet Take 12.5 mg by mouth 2 (two) times daily.  0  . metroNIDAZOLE (FLAGYL) 500 MG tablet Take 1 tablet by mouth 3 (three) times daily.  0  . montelukast (SINGULAIR) 10 MG tablet Take 10 mg by mouth at bedtime.    . Multiple Vitamin (MULTIVITAMIN) capsule Take 1 capsule by mouth daily.    . nitroGLYCERIN (NITROSTAT) 0.4 MG SL tablet Place 0.4 mg under the tongue every 5 (five) minutes as needed for chest pain.    Marland Kitchen omalizumab (XOLAIR) 150 MG injection Inject 300 mg into the skin.    Marland Kitchen omeprazole (PRILOSEC) 20 MG capsule Take 20 mg by mouth 2 (two) times daily.  0  . phentermine 15 MG capsule Take 15 mg by mouth as needed (severe nausea and vomitting).     . potassium chloride SA (K-DUR,KLOR-CON) 20 MEQ tablet Take 20 mEq by mouth 2 (two) times daily.    . predniSONE (DELTASONE) 10 MG tablet Take 1 tablet by mouth daily.  0  . promethazine (PHENERGAN) 25 MG tablet Take 25 mg by mouth every 6 (six) hours as needed.    . rivaroxaban (XARELTO) 20 MG TABS tablet Take 1 tablet (20 mg total) by mouth daily with supper. 30 tablet 0  . tetracycline (ACHROMYCIN,SUMYCIN) 500 MG capsule Take 500 mg by mouth 4 (four) times daily.  0  . tiotropium (SPIRIVA HANDIHALER) 18 MCG inhalation capsule Place 1 capsule into inhaler and inhale daily.    . VOLTAREN 1 % GEL Apply 2 g topically as needed (for OA pain).   3  . ondansetron (ZOFRAN ODT) 8 MG  disintegrating tablet Take 1 tablet (8 mg total) by mouth every 8 (eight) hours as needed for nausea or vomiting. (Patient not taking: Reported on 08/28/2015) 20 tablet 0  . ondansetron (ZOFRAN) 4 MG tablet Take 1 tablet (4 mg total) by mouth every 8 (eight) hours as needed for nausea or vomiting. 30 tablet 1   No current facility-administered medications for this visit.    Allergies as of 08/28/2015 - Review Complete 08/28/2015  Allergen Reaction Noted  . Aspirin Shortness Of Breath and Palpitations 05/11/2011  . Codeine sulfate Shortness Of Breath and Palpitations 05/11/2011  .  Darvocet [propoxyphene n-acetaminophen] Shortness Of Breath and Palpitations 05/11/2011  . Tramadol Shortness Of Breath 10/30/2014  . Penicillins Other (See Comments) 05/11/2011    Family History  Problem Relation Age of Onset  . Heart attack Mother   . Hypertension Mother   . Heart attack Father   . Asthma Father   . Hyperlipidemia Father   . Hypertension Father   . Heart attack Sister   . Arrhythmia Sister   . Asthma Sister   . Hyperlipidemia Sister   . Hypertension Sister   . Asthma Brother   . Hypertension Brother     Social History   Social History  . Marital Status: Single    Spouse Name: N/A  . Number of Children: N/A  . Years of Education: N/A   Occupational History  . Not on file.   Social History Main Topics  . Smoking status: Never Smoker   . Smokeless tobacco: Never Used  . Alcohol Use: No  . Drug Use: No  . Sexual Activity: No   Other Topics Concern  . Not on file   Social History Narrative    Review of Systems: General: Negative for anorexia, weight loss, fever, chills, fatigue, weakness. ENT: Negative for hoarseness, difficulty swallowing. CV: Negative for chest pain, angina, palpitations, peripheral edema.  Respiratory: Negative for dyspnea at rest, cough, sputum, wheezing.  GI: See history of present illness. MS: Negative for joint pain, low back pain.  Derm:  Negative for rash or itching.  Endo: Negative for unusual weight change.  Heme: Negative for bruising or bleeding. Allergy: Negative for rash or hives.    Physical Exam: BP 99/67 mmHg  Pulse 60  Temp(Src) 97.1 F (36.2 C) (Oral)  Ht 5\' 2"  (1.575 m)  Wt 247 lb 12.8 oz (112.401 kg)  BMI 45.31 kg/m2 General:   Alert and oriented. Pleasant and cooperative. Well-nourished and well-developed. Morbidly obese. Head:  Normocephalic and atraumatic. Eyes:  Without icterus, sclera clear and conjunctiva pink.  Ears:  Normal auditory acuity. Cardiovascular:  S1, S2 present without murmurs appreciated. Extremities without clubbing or edema. Respiratory:  Clear to auscultation bilaterally. No wheezes, rales, or rhonchi. No distress.  Gastrointestinal:  +BS, obese/rounded but soft, and non-distended. Moderate epigastric TTP noted. No HSM noted. No guarding or rebound. No masses appreciated.  Rectal:  Deferred  Musculoskalatal:  Symmetrical without gross deformities. Neurologic:  Alert and oriented x4;  grossly normal neurologically. Psych:  Alert and cooperative. Normal mood and affect. Heme/Lymph/Immune: No excessive bruising noted.    08/28/2015 11:14 AM   Disclaimer: This note was dictated with voice recognition software. Similar sounding words can inadvertently be transcribed and may not be corrected upon review.

## 2015-08-28 NOTE — Assessment & Plan Note (Signed)
Complains of darkened stools, heme-negative in the emergency department. Hemoglobin normal. No history of anemia through the past 3 years. She states she had been taking Pepto-Bismol but denies taking that at the time she was seen emergency department. She couldn't have taken it inadvertently not remember. No point to suspect ongoing GI bleed. No darkened stools since emergency room visit

## 2015-09-01 ENCOUNTER — Emergency Department (HOSPITAL_COMMUNITY)
Admission: EM | Admit: 2015-09-01 | Discharge: 2015-09-01 | Disposition: A | Discharge status: Home / Self Care | Payer: Medicare Other | Attending: Dermatology | Admitting: Dermatology

## 2015-09-01 ENCOUNTER — Encounter (HOSPITAL_COMMUNITY): Payer: Self-pay

## 2015-09-01 DIAGNOSIS — I959 Hypotension, unspecified: Secondary | ICD-10-CM | POA: Insufficient documentation

## 2015-09-01 DIAGNOSIS — R42 Dizziness and giddiness: Secondary | ICD-10-CM | POA: Insufficient documentation

## 2015-09-01 DIAGNOSIS — Z5321 Procedure and treatment not carried out due to patient leaving prior to being seen by health care provider: Secondary | ICD-10-CM | POA: Diagnosis not present

## 2015-09-01 LAB — BASIC METABOLIC PANEL
Anion gap: 4 — ABNORMAL LOW (ref 5–15)
BUN: 21 mg/dL — AB (ref 6–20)
CALCIUM: 8.8 mg/dL — AB (ref 8.9–10.3)
CO2: 28 mmol/L (ref 22–32)
CREATININE: 1.26 mg/dL — AB (ref 0.44–1.00)
Chloride: 109 mmol/L (ref 101–111)
GFR calc Af Amer: 54 mL/min — ABNORMAL LOW (ref >60)
GFR, EST NON AFRICAN AMERICAN: 46 mL/min — AB (ref >60)
GLUCOSE: 108 mg/dL — AB (ref 65–99)
POTASSIUM: 3 mmol/L — AB (ref 3.5–5.1)
Sodium: 141 mmol/L (ref 135–145)

## 2015-09-01 LAB — CBG MONITORING, ED: Glucose-Capillary: 92 mg/dL (ref 65–99)

## 2015-09-01 LAB — CBC
HEMATOCRIT: 34.6 % — AB (ref 36.0–46.0)
Hemoglobin: 11.2 g/dL — ABNORMAL LOW (ref 12.0–15.0)
MCH: 28.9 pg (ref 26.0–34.0)
MCHC: 32.4 g/dL (ref 30.0–36.0)
MCV: 89.2 fL (ref 78.0–100.0)
Platelets: 248 10*3/uL (ref 150–400)
RBC: 3.88 MIL/uL (ref 3.87–5.11)
RDW: 14.7 % (ref 11.5–15.5)
WBC: 5.8 10*3/uL (ref 4.0–10.5)

## 2015-09-01 NOTE — ED Notes (Signed)
Pt here with low blood pressure, dizziness and nausea since 2 weeks.  Recently at [place] for same. DX unknown.  Not getting better.  Told her to decrease bp meds.

## 2015-09-13 ENCOUNTER — Telehealth: Payer: Self-pay | Admitting: Nurse Practitioner

## 2015-09-13 NOTE — Telephone Encounter (Signed)
Received cardiac clearance from [place], Neldon Labella, AGNP-C. Last eval 06-18-15. Coronary angiogram 04/09/14 normal coronaries, deemed low risk for perioperative CV complication. OK to hold Xarelto 24 hours prior to procedure. Will scan clearance document  Ok to finalize procedure scheduling. Please notify patient to hold Xarelto for 24 hours prior to her EGD.

## 2015-09-16 NOTE — Telephone Encounter (Signed)
Tried to call pt- NA- LMOM 

## 2015-09-16 NOTE — Telephone Encounter (Signed)
Pt is aware.  

## 2015-09-17 ENCOUNTER — Ambulatory Visit (HOSPITAL_COMMUNITY)
Admission: RE | Admit: 2015-09-17 | Discharge: 2015-09-17 | Disposition: A | Discharge status: Home / Self Care | Payer: Medicare Other | Source: Ambulatory Visit | Attending: Internal Medicine | Admitting: Internal Medicine

## 2015-09-17 ENCOUNTER — Encounter (HOSPITAL_COMMUNITY): Payer: Self-pay | Admitting: *Deleted

## 2015-09-17 ENCOUNTER — Encounter (HOSPITAL_COMMUNITY)
Admission: RE | Disposition: A | Discharge status: Home / Self Care | Payer: Self-pay | Source: Ambulatory Visit | Attending: Internal Medicine

## 2015-09-17 DIAGNOSIS — I4891 Unspecified atrial fibrillation: Secondary | ICD-10-CM | POA: Diagnosis not present

## 2015-09-17 DIAGNOSIS — Z79899 Other long term (current) drug therapy: Secondary | ICD-10-CM | POA: Insufficient documentation

## 2015-09-17 DIAGNOSIS — Z9884 Bariatric surgery status: Secondary | ICD-10-CM | POA: Diagnosis not present

## 2015-09-17 DIAGNOSIS — R110 Nausea: Secondary | ICD-10-CM

## 2015-09-17 DIAGNOSIS — Z7951 Long term (current) use of inhaled steroids: Secondary | ICD-10-CM | POA: Insufficient documentation

## 2015-09-17 DIAGNOSIS — E079 Disorder of thyroid, unspecified: Secondary | ICD-10-CM | POA: Insufficient documentation

## 2015-09-17 DIAGNOSIS — K921 Melena: Secondary | ICD-10-CM | POA: Insufficient documentation

## 2015-09-17 DIAGNOSIS — E785 Hyperlipidemia, unspecified: Secondary | ICD-10-CM | POA: Insufficient documentation

## 2015-09-17 DIAGNOSIS — Z7901 Long term (current) use of anticoagulants: Secondary | ICD-10-CM | POA: Insufficient documentation

## 2015-09-17 DIAGNOSIS — K219 Gastro-esophageal reflux disease without esophagitis: Secondary | ICD-10-CM | POA: Insufficient documentation

## 2015-09-17 DIAGNOSIS — I10 Essential (primary) hypertension: Secondary | ICD-10-CM | POA: Insufficient documentation

## 2015-09-17 DIAGNOSIS — G8929 Other chronic pain: Secondary | ICD-10-CM | POA: Insufficient documentation

## 2015-09-17 DIAGNOSIS — R1013 Epigastric pain: Secondary | ICD-10-CM

## 2015-09-17 DIAGNOSIS — Z6841 Body Mass Index (BMI) 40.0 and over, adult: Secondary | ICD-10-CM | POA: Insufficient documentation

## 2015-09-17 DIAGNOSIS — J449 Chronic obstructive pulmonary disease, unspecified: Secondary | ICD-10-CM | POA: Diagnosis not present

## 2015-09-17 HISTORY — PX: ESOPHAGOGASTRODUODENOSCOPY: SHX5428

## 2015-09-17 LAB — HEMOGLOBIN AND HEMATOCRIT, BLOOD
HCT: 36.7 % (ref 36.0–46.0)
Hemoglobin: 11.6 g/dL — ABNORMAL LOW (ref 12.0–15.0)

## 2015-09-17 SURGERY — EGD (ESOPHAGOGASTRODUODENOSCOPY)
Anesthesia: Moderate Sedation

## 2015-09-17 MED ORDER — ONDANSETRON HCL 4 MG/2ML IJ SOLN
INTRAMUSCULAR | Status: DC | PRN
  Administered 2015-09-17: 4 mg via INTRAVENOUS

## 2015-09-17 MED ORDER — MEPERIDINE HCL 50 MG/ML IJ SOLN
INTRAMUSCULAR | Status: AC
  Filled 2015-09-17: qty 1

## 2015-09-17 MED ORDER — MIDAZOLAM HCL 5 MG/5ML IJ SOLN
INTRAMUSCULAR | Status: DC | PRN
  Administered 2015-09-17 (×2): 2 mg via INTRAVENOUS
  Administered 2015-09-17: 1 mg via INTRAVENOUS

## 2015-09-17 MED ORDER — SODIUM CHLORIDE 0.9 % IV SOLN
INTRAVENOUS | Status: DC
  Administered 2015-09-17: 08:00:00 via INTRAVENOUS

## 2015-09-17 MED ORDER — LIDOCAINE VISCOUS 2 % MT SOLN
OROMUCOSAL | Status: AC
  Filled 2015-09-17: qty 15

## 2015-09-17 MED ORDER — MIDAZOLAM HCL 5 MG/5ML IJ SOLN
INTRAMUSCULAR | Status: AC
  Filled 2015-09-17: qty 10

## 2015-09-17 MED ORDER — LIDOCAINE VISCOUS 2 % MT SOLN
OROMUCOSAL | Status: DC | PRN
  Administered 2015-09-17: 1 via OROMUCOSAL

## 2015-09-17 MED ORDER — PROMETHAZINE HCL 25 MG/ML IJ SOLN
INTRAMUSCULAR | Status: AC
  Filled 2015-09-17: qty 1

## 2015-09-17 MED ORDER — SODIUM CHLORIDE 0.9% FLUSH
INTRAVENOUS | Status: AC
  Filled 2015-09-17: qty 10

## 2015-09-17 MED ORDER — ONDANSETRON HCL 4 MG/2ML IJ SOLN
INTRAMUSCULAR | Status: AC
  Filled 2015-09-17: qty 2

## 2015-09-17 MED ORDER — PROMETHAZINE HCL 25 MG/ML IJ SOLN
12.5000 mg | Freq: Once | INTRAMUSCULAR | Status: AC
Start: 2015-09-17 — End: 2015-09-17
  Administered 2015-09-17: 12.5 mg via INTRAVENOUS

## 2015-09-17 MED ORDER — MEPERIDINE HCL 100 MG/ML IJ SOLN
INTRAMUSCULAR | Status: AC
  Filled 2015-09-17: qty 2

## 2015-09-17 MED ORDER — MEPERIDINE HCL 100 MG/ML IJ SOLN
INTRAMUSCULAR | Status: DC | PRN
  Administered 2015-09-17: 50 mg via INTRAVENOUS
  Administered 2015-09-17: 25 mg via INTRAVENOUS

## 2015-09-17 NOTE — Op Note (Signed)
[facility] Patient Name: Deborah Murray Procedure Date: 09/17/2015 7:57 AM MRN: [id number] Date of Birth: 1960-10-26 Attending MD: Norvel Richards , MD CSN: [id number] Age: 55 Admit Type: Outpatient Procedure:                Upper GI endoscopy - diagnostic Indications:              Melena Providers:                Norvel Richards, MD, Lurline Del, RN, Isabella Stalling, Technician Referring MD:             Sol Passer Medicines:                Midazolam 5 mg IV, Meperidine 75 mg IV, Ondansetron                            4 mg IV, Promethazine [id number] mg IV Complications:            No immediate complications. Estimated Blood Loss:     Estimated blood loss: none. Procedure:                Pre-Anesthesia Assessment:                           - Prior to the procedure, a History and Physical                            was performed, and patient medications and                            allergies were reviewed. The patient's tolerance of                            previous anesthesia was also reviewed. The risks                            and benefits of the procedure and the sedation                            options and risks were discussed with the patient.                            All questions were answered, and informed consent                            was obtained. Prior Anticoagulants: The patient has                            taken no previous anticoagulant or antiplatelet                            agents. ASA Grade Assessment: II - A patient with  mild systemic disease. After reviewing the risks                            and benefits, the patient was deemed in                            satisfactory condition to undergo the procedure.                           After obtaining informed consent, the endoscope was                            passed under direct vision. Throughout the   procedure, the patient's blood pressure, pulse, and                            oxygen saturations were monitored continuously. The                            [id number]) scope was introduced through the                            mouth, and advanced to the efferent jejunal loop.                            The upper GI endoscopy was accomplished without                            difficulty. The patient tolerated the procedure                            well. The upper GI endoscopy was accomplished                            without difficulty. The patient tolerated the                            procedure well. Scope In: 8:34:01 AM Scope Out: 8:38:21 AM Total Procedure Duration: 0 hours 4 minutes 20 seconds  Findings:      The examined esophagus was normal.      surgically altered stomach - consistent with prior gastric obesity       procedure. Single eferent limb. Normal residual gastric mucosa. Impression:               - Normal esophagus.                           - Surgically atered stomac.                           - No specimens collected. No explanation for                            melena. I noted her hemoglobin did drop by 2 g as  of 2 weeks ago. Hemoccult negative. Moderate Sedation:      Moderate (conscious) sedation was administered by the endoscopy nurse       and supervised by the endoscopist. The following parameters were       monitored: oxygen saturation, heart rate, blood pressure, respiratory       rate, EKG, adequacy of pulmonary ventilation, and response to care.       Total physician intraservice time was 17 minutes. Recommendation:           - Patient has a contact number available for                            emergencies. The signs and symptoms of potential                            delayed complications were discussed with the                            patient. Return to normal activities tomorrow.                             Written discharge instructions were provided to the                            patient.                           - Advance diet as tolerated.                           - Continue present medications. repeat H&H Today.                           - Return to my office in 6 weeks. Procedure Code(s):        --- Professional ---                           [phone], Moderate sedation services provided by the                            same physician or other qualified health care                            professional performing the diagnostic or                            therapeutic service that the sedation supports,                            requiring the presence of an independent trained                            observer to assist in the monitoring of the  patient's level of consciousness and physiological                            status; initial 15 minutes of intraservice time,                            patient age 30 years or older Diagnosis Code(s):        --- Professional ---                           K92.1, Melena (includes Hematochezia) CPT copyright 2016 American Medical Association. All rights reserved. The codes documented in this report are preliminary and upon coder review may  be revised to meet current compliance requirements. Cristopher Estimable. Leslea Vowles, MD Norvel Richards, MD 09/17/2015 8:50:55 AM This report has been signed electronically. Number of Addenda: 0

## 2015-09-17 NOTE — H&P (View-Only) (Signed)
Primary Care Physician:  Sol Passer, MD Primary Gastroenterologist:  Dr. Gala Romney  Chief Complaint  Patient presents with  . Melena    HPI:   Deborah Murray is a 55 y.o. female who presents on referral from the emergency department for melena and heme-negative stool. The patient was seen in the emergency department 08/26/2015 for complaints of blood in her stool 2 weeks prior that has worsened over the previous 2 days. Also complained of associated abdominal pain, nausea, dizziness. States stools are black and tarry, currently taking Xarelto. Also noted some shortness of breath and frequent heartburn. Denies iron supplementation at that time. Rectal exam was normal with brown stool noted. Stools are heme negative and hemoglobin was 13, no signs of active bleeding. No previous colonoscopy or endoscopy our system. Review of historical labs back through 2014 do not show any anemia.  Today she confirms the above history. Saw urgent care 2 weeks prior for the same and was given Tetracycline. No recurrent dark stools since ER discharge. Is having persistent nausea, worse after eating. No vomiting. Denies hematochezia. Her GERD symptoms include epigastric pain, esophageal burning, bitter taste, and nausea. Has tried promethazine (which makes her sleepy), is also on Nexium (which she has been on for years.) She stopped nexium per instructions from urgent care and trialed on omeprazole, which has not helped. Also taking pepto bismol and maalox,. Last pepto use was Monday but was not taking that when she went to the ER. Denies chest pain, dyspnea, syncope, near syncope. Denies any other upper or lower GI symptoms.  Last colonoscopy 3-4 years ago at [facility]. Last EGD at least 5-6 years ago.  Past Medical History  Diagnosis Date  . Asthma   . Hyperlipidemia   . Hypertension   . Syncope and collapse 05/11/11  adrenal insuffiency  treated at [facility]  . Thyroid disease   . Headache   . Vocal  cord dysfunction   . Chronic back pain   . Lumbar radiculopathy   . 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 COPD (chronic obstructive pulmonary disease) ([place])   . Atrial fibrillation [facility])     Past Surgical History  Procedure Laterality Date  . Cardiac catheterization  2012 at [facility]  . 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 Abdominal hysterectomy    . Cholecystectomy, laparoscopic    . Eye surg for glaucoma    . Right knee surg    . Cholecystectomy      Current Outpatient Prescriptions  Medication Sig Dispense Refill  . albuterol (PROVENTIL HFA;VENTOLIN HFA) 108 (90 BASE) MCG/ACT inhaler Inhale 2 puffs into the lungs every 4 (four) hours as needed for wheezing.     Marland Kitchen albuterol (PROVENTIL) (2.5 MG/3ML) 0.083% nebulizer solution Take 2.5 mg by nebulization every 6 (six) hours as needed for wheezing or shortness of breath.    Marland Kitchen aluminum chloride (DRYSOL) 20 % external solution Apply 1 application topically at bedtime as needed (for sweating and applies under arms).     Marland Kitchen atorvastatin (LIPITOR) 20 MG tablet Take 20 mg by mouth every evening.     . calcium citrate (CALCITRATE - DOSED IN MG ELEMENTAL CALCIUM) 950 MG tablet Take 1 tablet by mouth daily.    . Cholecalciferol (VITAMIN D) 2000 UNITS tablet Take 2,000 Units by mouth daily.    . ciprofloxacin (CIPRO) 500 MG tablet Take 1 tablet by mouth 2 (two) times daily.  0  .  citalopram (CELEXA) 20 MG tablet Take 20 mg by mouth at bedtime.     . clindamycin (CLEOCIN T) 1 % external solution Apply 1 application topically every evening. Applies to underarms    . DULoxetine (CYMBALTA) 30 MG capsule Take 1 capsule by mouth daily.  3  . EPINEPHrine (EPIPEN JR) 0.15 MG/0.3ML injection Inject 0.15 mg into the muscle Once PRN.    Marland Kitchen esomeprazole (NEXIUM) 40 MG capsule Take 40 mg by mouth 2 (two) times daily.     . fluticasone (FLONASE) 50 MCG/ACT nasal spray SHAKE LQ AND U 2 SPRAYS IEN D  5  . fluticasone (FLOVENT HFA) 220 MCG/ACT inhaler  Inhale 2 puffs into the lungs every 4 (four) hours.    . gabapentin (NEURONTIN) 300 MG capsule Take 900 mg by mouth 3 (three) times daily.     Marland Kitchen ipratropium (ATROVENT) 0.02 % nebulizer solution Inhale 2 mLs into the lungs 4 (four) times daily.  0  . latanoprost (XALATAN) 0.005 % ophthalmic solution Place 1 drop into both eyes at bedtime.    Marland Kitchen levocetirizine (XYZAL) 5 MG tablet Take 5 mg by mouth every evening.    Marland Kitchen lisinopril-hydrochlorothiazide (PRINZIDE,ZESTORETIC) 20-25 MG per tablet Take 1 tablet by mouth daily.    . metoprolol tartrate (LOPRESSOR) 25 MG tablet Take 12.5 mg by mouth 2 (two) times daily.  0  . metroNIDAZOLE (FLAGYL) 500 MG tablet Take 1 tablet by mouth 3 (three) times daily.  0  . montelukast (SINGULAIR) 10 MG tablet Take 10 mg by mouth at bedtime.    . Multiple Vitamin (MULTIVITAMIN) capsule Take 1 capsule by mouth daily.    . nitroGLYCERIN (NITROSTAT) 0.4 MG SL tablet Place 0.4 mg under the tongue every 5 (five) minutes as needed for chest pain.    Marland Kitchen omalizumab (XOLAIR) 150 MG injection Inject 300 mg into the skin.    Marland Kitchen omeprazole (PRILOSEC) 20 MG capsule Take 20 mg by mouth 2 (two) times daily.  0  . phentermine 15 MG capsule Take 15 mg by mouth as needed (severe nausea and vomitting).     . potassium chloride SA (K-DUR,KLOR-CON) 20 MEQ tablet Take 20 mEq by mouth 2 (two) times daily.    . predniSONE (DELTASONE) 10 MG tablet Take 1 tablet by mouth daily.  0  . promethazine (PHENERGAN) 25 MG tablet Take 25 mg by mouth every 6 (six) hours as needed.    . rivaroxaban (XARELTO) 20 MG TABS tablet Take 1 tablet (20 mg total) by mouth daily with supper. 30 tablet 0  . tetracycline (ACHROMYCIN,SUMYCIN) 500 MG capsule Take 500 mg by mouth 4 (four) times daily.  0  . tiotropium (SPIRIVA HANDIHALER) 18 MCG inhalation capsule Place 1 capsule into inhaler and inhale daily.    . VOLTAREN 1 % GEL Apply 2 g topically as needed (for OA pain).   3  . ondansetron (ZOFRAN ODT) 8 MG  disintegrating tablet Take 1 tablet (8 mg total) by mouth every 8 (eight) hours as needed for nausea or vomiting. (Patient not taking: Reported on 08/28/2015) 20 tablet 0  . ondansetron (ZOFRAN) 4 MG tablet Take 1 tablet (4 mg total) by mouth every 8 (eight) hours as needed for nausea or vomiting. 30 tablet 1   No current facility-administered medications for this visit.    Allergies as of 08/28/2015 - Review Complete 08/28/2015  Allergen Reaction Noted  . Aspirin Shortness Of Breath and Palpitations 05/11/2011  . Codeine sulfate Shortness Of Breath and Palpitations 05/11/2011  .  Darvocet [propoxyphene n-acetaminophen] Shortness Of Breath and Palpitations 05/11/2011  . Tramadol Shortness Of Breath 10/30/2014  . Penicillins Other (See Comments) 05/11/2011    Family History  Problem Relation Age of Onset  . Heart attack Mother   . Hypertension Mother   . Heart attack Father   . Asthma Father   . Hyperlipidemia Father   . Hypertension Father   . Heart attack Sister   . Arrhythmia Sister   . Asthma Sister   . Hyperlipidemia Sister   . Hypertension Sister   . Asthma Brother   . Hypertension Brother     Social History   Social History  . Marital Status: Single    Spouse Name: N/A  . Number of Children: N/A  . Years of Education: N/A   Occupational History  . Not on file.   Social History Main Topics  . Smoking status: Never Smoker   . Smokeless tobacco: Never Used  . Alcohol Use: No  . Drug Use: No  . Sexual Activity: No   Other Topics Concern  . Not on file   Social History Narrative    Review of Systems: General: Negative for anorexia, weight loss, fever, chills, fatigue, weakness. ENT: Negative for hoarseness, difficulty swallowing. CV: Negative for chest pain, angina, palpitations, peripheral edema.  Respiratory: Negative for dyspnea at rest, cough, sputum, wheezing.  GI: See history of present illness. MS: Negative for joint pain, low back pain.  Derm:  Negative for rash or itching.  Endo: Negative for unusual weight change.  Heme: Negative for bruising or bleeding. Allergy: Negative for rash or hives.    Physical Exam: BP 99/67 mmHg  Pulse 60  Temp(Src) 97.1 F (36.2 C) (Oral)  Ht 5\' 2"  (1.575 m)  Wt 247 lb 12.8 oz (112.401 kg)  BMI 45.31 kg/m2 General:   Alert and oriented. Pleasant and cooperative. Well-nourished and well-developed. Morbidly obese. Head:  Normocephalic and atraumatic. Eyes:  Without icterus, sclera clear and conjunctiva pink.  Ears:  Normal auditory acuity. Cardiovascular:  S1, S2 present without murmurs appreciated. Extremities without clubbing or edema. Respiratory:  Clear to auscultation bilaterally. No wheezes, rales, or rhonchi. No distress.  Gastrointestinal:  +BS, obese/rounded but soft, and non-distended. Moderate epigastric TTP noted. No HSM noted. No guarding or rebound. No masses appreciated.  Rectal:  Deferred  Musculoskalatal:  Symmetrical without gross deformities. Neurologic:  Alert and oriented x4;  grossly normal neurologically. Psych:  Alert and cooperative. Normal mood and affect. Heme/Lymph/Immune: No excessive bruising noted.    08/28/2015 11:14 AM   Disclaimer: This note was dictated with voice recognition software. Similar sounding words can inadvertently be transcribed and may not be corrected upon review.

## 2015-09-17 NOTE — Interval H&P Note (Signed)
History and Physical Interval Note:  09/17/2015 8:16 AM  Deborah Murray  has presented today for surgery, with the diagnosis of GERD, nausea, epigastric pain  The various methods of treatment have been discussed with the patient and family. After consideration of risks, benefits and other options for treatment, the patient has consented to  Procedure(s) with comments: ESOPHAGOGASTRODUODENOSCOPY (EGD) (N/A) - 0815 as a surgical intervention .  The patient's history has been reviewed, patient examined, no change in status, stable for surgery.  I have reviewed the patient's chart and labs.  Questions were answered to the patient's satisfaction.     Deborah Murray  No change. Diagnostic EGD per plan.The risks, benefits, limitations, alternatives and imponderables have been reviewed with the patient. Potential for esophageal dilation, biopsy, etc. have also been reviewed.  Questions have been answered. All parties agreeable.

## 2015-09-17 NOTE — Discharge Instructions (Signed)
EGD Discharge instructions Please read the instructions outlined below and refer to this sheet in the next few weeks. These discharge instructions provide you with general information on caring for yourself after you leave the hospital. Your doctor may also give you specific instructions. While your treatment has been planned according to the most current medical practices available, unavoidable complications occasionally occur. If you have any problems or questions after discharge, please call your doctor.  Dr Gala Romney:  [phone] ACTIVITY  You may resume your regular activity but move at a slower pace for the next 24 hours.   Take frequent rest periods for the next 24 hours.   Walking will help expel (get rid of) the air and reduce the bloated feeling in your abdomen.   No driving for 24 hours (because of the anesthesia (medicine) used during the test).   You may shower.   Do not sign any important legal documents or operate any machinery for 24 hours (because of the anesthesia used during the test.)   NU TRITION  Drink plenty of fluids.   You may resume your normal diet.   Begin with a light meal and progress to your normal diet.   Avoid alcoholic beverages for 24 hours or as instructed by your caregiver.  MEDICATIONS  You may resume your normal medications unless your caregiver tells you otherwise.  WHAT YOU CAN EXPECT TODAY  You may experience abdominal discomfort such as a feeling of fullness or gas pains.  FOLLOW-UP  Your doctor will discuss the results of your test with you.  SEEK IMMEDIATE MEDICAL ATTENTION IF ANY OF THE FOLLOWING OCCUR:  Excessive nausea (feeling sick to your stomach) and/or vomiting.   Severe abdominal pain and distention (swelling).   Trouble swallowing.   Temperature over 101 F (37.8 C).   Rectal bleeding or vomiting of blood.    H&H today. Done  Office visit with [place] in 6 weeks  Continue Xarelto as of today.

## 2015-09-19 ENCOUNTER — Encounter (HOSPITAL_COMMUNITY): Payer: Self-pay | Admitting: Internal Medicine

## 2015-10-30 ENCOUNTER — Encounter (HOSPITAL_COMMUNITY): Payer: Self-pay | Admitting: Emergency Medicine

## 2015-10-30 ENCOUNTER — Emergency Department (HOSPITAL_COMMUNITY): Payer: Medicare Other

## 2015-10-30 ENCOUNTER — Inpatient Hospital Stay (HOSPITAL_COMMUNITY)
Admission: EM | Admit: 2015-10-30 | Discharge: 2015-11-19 | DRG: 371 | Disposition: A | Discharge status: Other Acute Inpatient Hospital | Payer: Medicare Other | Attending: Family Medicine | Admitting: Family Medicine

## 2015-10-30 DIAGNOSIS — E861 Hypovolemia: Secondary | ICD-10-CM | POA: Diagnosis present

## 2015-10-30 DIAGNOSIS — I9589 Other hypotension: Secondary | ICD-10-CM

## 2015-10-30 DIAGNOSIS — I959 Hypotension, unspecified: Secondary | ICD-10-CM | POA: Diagnosis present

## 2015-10-30 DIAGNOSIS — Z6841 Body Mass Index (BMI) 40.0 and over, adult: Secondary | ICD-10-CM | POA: Diagnosis not present

## 2015-10-30 DIAGNOSIS — N183 Chronic kidney disease, stage 3 (moderate): Secondary | ICD-10-CM | POA: Diagnosis present

## 2015-10-30 DIAGNOSIS — R509 Fever, unspecified: Secondary | ICD-10-CM | POA: Diagnosis not present

## 2015-10-30 DIAGNOSIS — A414 Sepsis due to anaerobes: Secondary | ICD-10-CM | POA: Diagnosis not present

## 2015-10-30 DIAGNOSIS — E2740 Unspecified adrenocortical insufficiency: Secondary | ICD-10-CM | POA: Diagnosis present

## 2015-10-30 DIAGNOSIS — A047 Enterocolitis due to Clostridium difficile: Principal | ICD-10-CM | POA: Diagnosis present

## 2015-10-30 DIAGNOSIS — N390 Urinary tract infection, site not specified: Secondary | ICD-10-CM | POA: Diagnosis present

## 2015-10-30 DIAGNOSIS — E871 Hypo-osmolality and hyponatremia: Secondary | ICD-10-CM | POA: Diagnosis present

## 2015-10-30 DIAGNOSIS — Z8639 Personal history of other endocrine, nutritional and metabolic disease: Secondary | ICD-10-CM

## 2015-10-30 DIAGNOSIS — I10 Essential (primary) hypertension: Secondary | ICD-10-CM | POA: Diagnosis not present

## 2015-10-30 DIAGNOSIS — Z9884 Bariatric surgery status: Secondary | ICD-10-CM

## 2015-10-30 DIAGNOSIS — R188 Other ascites: Secondary | ICD-10-CM | POA: Diagnosis present

## 2015-10-30 DIAGNOSIS — Z825 Family history of asthma and other chronic lower respiratory diseases: Secondary | ICD-10-CM

## 2015-10-30 DIAGNOSIS — I4891 Unspecified atrial fibrillation: Secondary | ICD-10-CM | POA: Diagnosis present

## 2015-10-30 DIAGNOSIS — R109 Unspecified abdominal pain: Secondary | ICD-10-CM

## 2015-10-30 DIAGNOSIS — J449 Chronic obstructive pulmonary disease, unspecified: Secondary | ICD-10-CM | POA: Diagnosis present

## 2015-10-30 DIAGNOSIS — T380X5A Adverse effect of glucocorticoids and synthetic analogues, initial encounter: Secondary | ICD-10-CM | POA: Diagnosis present

## 2015-10-30 DIAGNOSIS — E876 Hypokalemia: Secondary | ICD-10-CM | POA: Diagnosis present

## 2015-10-30 DIAGNOSIS — K529 Noninfective gastroenteritis and colitis, unspecified: Secondary | ICD-10-CM

## 2015-10-30 DIAGNOSIS — I129 Hypertensive chronic kidney disease with stage 1 through stage 4 chronic kidney disease, or unspecified chronic kidney disease: Secondary | ICD-10-CM | POA: Diagnosis present

## 2015-10-30 DIAGNOSIS — E43 Unspecified severe protein-calorie malnutrition: Secondary | ICD-10-CM | POA: Diagnosis present

## 2015-10-30 DIAGNOSIS — Z981 Arthrodesis status: Secondary | ICD-10-CM

## 2015-10-30 DIAGNOSIS — Z79899 Other long term (current) drug therapy: Secondary | ICD-10-CM

## 2015-10-30 DIAGNOSIS — Z8249 Family history of ischemic heart disease and other diseases of the circulatory system: Secondary | ICD-10-CM | POA: Diagnosis not present

## 2015-10-30 DIAGNOSIS — B370 Candidal stomatitis: Secondary | ICD-10-CM | POA: Diagnosis not present

## 2015-10-30 DIAGNOSIS — J4550 Severe persistent asthma, uncomplicated: Secondary | ICD-10-CM | POA: Diagnosis present

## 2015-10-30 DIAGNOSIS — Z9049 Acquired absence of other specified parts of digestive tract: Secondary | ICD-10-CM

## 2015-10-30 DIAGNOSIS — R1084 Generalized abdominal pain: Secondary | ICD-10-CM

## 2015-10-30 DIAGNOSIS — K5100 Ulcerative (chronic) pancolitis without complications: Secondary | ICD-10-CM

## 2015-10-30 DIAGNOSIS — N179 Acute kidney failure, unspecified: Secondary | ICD-10-CM | POA: Diagnosis not present

## 2015-10-30 DIAGNOSIS — A419 Sepsis, unspecified organism: Secondary | ICD-10-CM | POA: Diagnosis not present

## 2015-10-30 DIAGNOSIS — R601 Generalized edema: Secondary | ICD-10-CM

## 2015-10-30 DIAGNOSIS — E860 Dehydration: Secondary | ICD-10-CM | POA: Diagnosis present

## 2015-10-30 DIAGNOSIS — A0472 Enterocolitis due to Clostridium difficile, not specified as recurrent: Secondary | ICD-10-CM

## 2015-10-30 DIAGNOSIS — Z7901 Long term (current) use of anticoagulants: Secondary | ICD-10-CM

## 2015-10-30 DIAGNOSIS — E785 Hyperlipidemia, unspecified: Secondary | ICD-10-CM | POA: Diagnosis present

## 2015-10-30 DIAGNOSIS — Z8679 Personal history of other diseases of the circulatory system: Secondary | ICD-10-CM

## 2015-10-30 DIAGNOSIS — Z7952 Long term (current) use of systemic steroids: Secondary | ICD-10-CM

## 2015-10-30 DIAGNOSIS — E872 Acidosis, unspecified: Secondary | ICD-10-CM | POA: Diagnosis not present

## 2015-10-30 DIAGNOSIS — R197 Diarrhea, unspecified: Secondary | ICD-10-CM

## 2015-10-30 DIAGNOSIS — E8770 Fluid overload, unspecified: Secondary | ICD-10-CM | POA: Diagnosis present

## 2015-10-30 DIAGNOSIS — E739 Lactose intolerance, unspecified: Secondary | ICD-10-CM | POA: Diagnosis present

## 2015-10-30 DIAGNOSIS — J45909 Unspecified asthma, uncomplicated: Secondary | ICD-10-CM

## 2015-10-30 HISTORY — DX: Unspecified adrenocortical insufficiency: E27.40

## 2015-10-30 HISTORY — DX: Sepsis, unspecified organism: A41.9

## 2015-10-30 LAB — BASIC METABOLIC PANEL
Anion gap: 8 (ref 5–15)
BUN: 19 mg/dL (ref 6–20)
CALCIUM: 7.1 mg/dL — AB (ref 8.9–10.3)
CHLORIDE: 100 mmol/L — AB (ref 101–111)
CO2: 27 mmol/L (ref 22–32)
CREATININE: 1.36 mg/dL — AB (ref 0.44–1.00)
GFR calc Af Amer: 49 mL/min — ABNORMAL LOW (ref >60)
GFR calc non Af Amer: 42 mL/min — ABNORMAL LOW (ref >60)
GLUCOSE: 86 mg/dL (ref 65–99)
Potassium: 2.1 mmol/L — CL (ref 3.5–5.1)
Sodium: 135 mmol/L (ref 135–145)

## 2015-10-30 LAB — HEPATIC FUNCTION PANEL
ALK PHOS: 88 U/L (ref 38–126)
ALT: 13 U/L — AB (ref 14–54)
AST: 16 U/L (ref 15–41)
Albumin: 2.1 g/dL — ABNORMAL LOW (ref 3.5–5.0)
BILIRUBIN TOTAL: 1.1 mg/dL (ref 0.3–1.2)
Bilirubin, Direct: 0.4 mg/dL (ref 0.1–0.5)
Indirect Bilirubin: 0.7 mg/dL (ref 0.3–0.9)
Total Protein: 4.8 g/dL — ABNORMAL LOW (ref 6.5–8.1)

## 2015-10-30 LAB — LIPASE, BLOOD: Lipase: 9 U/L — ABNORMAL LOW (ref 11–51)

## 2015-10-30 LAB — CBC WITH DIFFERENTIAL/PLATELET
BASOS PCT: 0 %
Basophils Absolute: 0 10*3/uL (ref 0.0–0.1)
Eosinophils Absolute: 0 10*3/uL (ref 0.0–0.7)
Eosinophils Relative: 0 %
HEMATOCRIT: 32.6 % — AB (ref 36.0–46.0)
Hemoglobin: 10.8 g/dL — ABNORMAL LOW (ref 12.0–15.0)
LYMPHS ABS: 0.4 10*3/uL — AB (ref 0.7–4.0)
Lymphocytes Relative: 2 %
MCH: 28.3 pg (ref 26.0–34.0)
MCHC: 33.1 g/dL (ref 30.0–36.0)
MCV: 85.6 fL (ref 78.0–100.0)
MONO ABS: 1.3 10*3/uL — AB (ref 0.1–1.0)
MONOS PCT: 6 %
NEUTROS ABS: 21.9 10*3/uL — AB (ref 1.7–7.7)
Neutrophils Relative %: 93 %
Platelets: 493 10*3/uL — ABNORMAL HIGH (ref 150–400)
RBC: 3.81 MIL/uL — ABNORMAL LOW (ref 3.87–5.11)
RDW: 15 % (ref 11.5–15.5)
WBC: 23.7 10*3/uL — ABNORMAL HIGH (ref 4.0–10.5)

## 2015-10-30 LAB — URINALYSIS, ROUTINE W REFLEX MICROSCOPIC
Glucose, UA: NEGATIVE mg/dL
HGB URINE DIPSTICK: NEGATIVE
Leukocytes, UA: NEGATIVE
Nitrite: NEGATIVE
PH: 6 (ref 5.0–8.0)
Protein, ur: NEGATIVE mg/dL
SPECIFIC GRAVITY, URINE: 1.025 (ref 1.005–1.030)

## 2015-10-30 LAB — MAGNESIUM: Magnesium: 1.7 mg/dL (ref 1.7–2.4)

## 2015-10-30 LAB — TROPONIN I: Troponin I: <0.03 ng/mL (ref <0.03)

## 2015-10-30 LAB — LACTIC ACID, PLASMA
LACTIC ACID, VENOUS: 1.8 mmol/L (ref 0.5–1.9)
LACTIC ACID, VENOUS: 1.5 mmol/L (ref 0.5–1.9)

## 2015-10-30 MED ORDER — TIOTROPIUM BROMIDE MONOHYDRATE 18 MCG IN CAPS
1.0000 | ORAL_CAPSULE | Freq: Every day | RESPIRATORY_TRACT | Status: DC
  Administered 2015-10-31 – 2015-11-19 (×20): 18 ug via RESPIRATORY_TRACT
  Filled 2015-10-30 (×4): qty 5

## 2015-10-30 MED ORDER — MONTELUKAST SODIUM 10 MG PO TABS
10.0000 mg | ORAL_TABLET | Freq: Every day | ORAL | Status: DC
  Administered 2015-11-01 – 2015-11-18 (×19): 10 mg via ORAL
  Filled 2015-10-30 (×19): qty 1

## 2015-10-30 MED ORDER — LEVOCETIRIZINE DIHYDROCHLORIDE 5 MG PO TABS: 5.0000 mg | ORAL_TABLET | Freq: Every evening | ORAL | Status: DC

## 2015-10-30 MED ORDER — CITALOPRAM HYDROBROMIDE 20 MG PO TABS
20.0000 mg | ORAL_TABLET | Freq: Every day | ORAL | Status: DC
  Administered 2015-11-01 – 2015-11-18 (×19): 20 mg via ORAL
  Filled 2015-10-30 (×19): qty 1

## 2015-10-30 MED ORDER — METRONIDAZOLE IN NACL 5-0.79 MG/ML-% IV SOLN: 500.0000 mg | Freq: Once | INTRAVENOUS | Status: DC

## 2015-10-30 MED ORDER — VANCOMYCIN 50 MG/ML ORAL SOLUTION
250.0000 mg | Freq: Four times a day (QID) | ORAL | Status: DC
  Administered 2015-10-31 – 2015-11-02 (×12): 250 mg via ORAL
  Filled 2015-10-30 (×19): qty 5

## 2015-10-30 MED ORDER — FLUTICASONE PROPIONATE HFA 220 MCG/ACT IN AERO: 2.0000 | INHALATION_SPRAY | Freq: Two times a day (BID) | RESPIRATORY_TRACT | Status: DC

## 2015-10-30 MED ORDER — BUDESONIDE 0.5 MG/2ML IN SUSP
0.5000 mg | Freq: Two times a day (BID) | RESPIRATORY_TRACT | Status: DC
  Administered 2015-10-30 – 2015-11-19 (×40): 0.5 mg via RESPIRATORY_TRACT
  Filled 2015-10-30 (×40): qty 2

## 2015-10-30 MED ORDER — SODIUM CHLORIDE 0.9 % IV BOLUS (SEPSIS)
1000.0000 mL | Freq: Once | INTRAVENOUS | Status: AC
  Administered 2015-10-30: 1000 mL via INTRAVENOUS

## 2015-10-30 MED ORDER — HYDROCORTISONE NA SUCCINATE PF 100 MG IJ SOLR
25.0000 mg | Freq: Three times a day (TID) | INTRAMUSCULAR | Status: DC
  Administered 2015-10-30 – 2015-11-01 (×5): 25 mg via INTRAVENOUS
  Filled 2015-10-30 (×5): qty 2

## 2015-10-30 MED ORDER — LEVOFLOXACIN IN D5W 750 MG/150ML IV SOLN
750.0000 mg | Freq: Once | INTRAVENOUS | Status: AC
  Administered 2015-10-30: 750 mg via INTRAVENOUS
  Filled 2015-10-30: qty 150

## 2015-10-30 MED ORDER — ONDANSETRON HCL 4 MG PO TABS
4.0000 mg | ORAL_TABLET | Freq: Four times a day (QID) | ORAL | Status: DC | PRN
  Administered 2015-11-01 – 2015-11-17 (×2): 4 mg via ORAL
  Filled 2015-10-30 (×2): qty 1

## 2015-10-30 MED ORDER — POTASSIUM CHLORIDE IN NACL 40-0.9 MEQ/L-% IV SOLN
INTRAVENOUS | Status: DC
  Administered 2015-10-30 – 2015-10-31 (×2): 150 mL/h via INTRAVENOUS

## 2015-10-30 MED ORDER — HYDROMORPHONE HCL 1 MG/ML IJ SOLN
0.5000 mg | INTRAMUSCULAR | Status: DC | PRN
  Administered 2015-10-30 – 2015-10-31 (×6): 1 mg via INTRAVENOUS
  Filled 2015-10-30 (×6): qty 1

## 2015-10-30 MED ORDER — VANCOMYCIN HCL IN DEXTROSE 1-5 GM/200ML-% IV SOLN: 1000.0000 mg | Freq: Once | INTRAVENOUS | Status: DC

## 2015-10-30 MED ORDER — DEXTROSE 5 % IV SOLN
2.0000 g | Freq: Once | INTRAVENOUS | Status: AC
  Administered 2015-10-30: 2 g via INTRAVENOUS
  Filled 2015-10-30: qty 2

## 2015-10-30 MED ORDER — SODIUM CHLORIDE 0.9 % IV SOLN
1000.0000 mL | INTRAVENOUS | Status: DC
  Administered 2015-10-30: 1000 mL via INTRAVENOUS

## 2015-10-30 MED ORDER — METHYLPREDNISOLONE SODIUM SUCC 125 MG IJ SOLR
125.0000 mg | Freq: Once | INTRAMUSCULAR | Status: AC
  Administered 2015-10-30: 125 mg via INTRAVENOUS
  Filled 2015-10-30: qty 2

## 2015-10-30 MED ORDER — LORATADINE 10 MG PO TABS
10.0000 mg | ORAL_TABLET | Freq: Every day | ORAL | Status: DC
  Administered 2015-10-31 – 2015-11-19 (×20): 10 mg via ORAL
  Filled 2015-10-30 (×20): qty 1

## 2015-10-30 MED ORDER — ALBUTEROL SULFATE (2.5 MG/3ML) 0.083% IN NEBU: 2.5000 mg | INHALATION_SOLUTION | Freq: Four times a day (QID) | RESPIRATORY_TRACT | Status: DC | PRN

## 2015-10-30 MED ORDER — SODIUM CHLORIDE 0.9 % IV BOLUS (SEPSIS)
500.0000 mL | Freq: Once | INTRAVENOUS | Status: AC
  Administered 2015-10-30: 500 mL via INTRAVENOUS

## 2015-10-30 MED ORDER — DIATRIZOATE MEGLUMINE & SODIUM 66-10 % PO SOLN
ORAL | Status: AC
  Filled 2015-10-30: qty 30

## 2015-10-30 MED ORDER — LATANOPROST 0.005 % OP SOLN
1.0000 [drp] | Freq: Every day | OPHTHALMIC | Status: DC
  Administered 2015-11-01 – 2015-11-18 (×18): 1 [drp] via OPHTHALMIC
  Filled 2015-10-30: qty 2.5

## 2015-10-30 MED ORDER — ONDANSETRON HCL 4 MG/2ML IJ SOLN
4.0000 mg | Freq: Four times a day (QID) | INTRAMUSCULAR | Status: DC | PRN
  Administered 2015-10-30 – 2015-11-19 (×16): 4 mg via INTRAVENOUS
  Filled 2015-10-30 (×17): qty 2

## 2015-10-30 MED ORDER — SODIUM CHLORIDE 0.9 % IV SOLN: INTRAVENOUS | Status: DC

## 2015-10-30 MED ORDER — ACETAMINOPHEN 650 MG RE SUPP
650.0000 mg | Freq: Once | RECTAL | Status: AC
  Administered 2015-10-30: 650 mg via RECTAL
  Filled 2015-10-30: qty 1

## 2015-10-30 MED ORDER — POTASSIUM CHLORIDE 10 MEQ/100ML IV SOLN
10.0000 meq | Freq: Once | INTRAVENOUS | Status: AC
  Administered 2015-10-30: 10 meq via INTRAVENOUS
  Filled 2015-10-30: qty 100

## 2015-10-30 MED ORDER — ONDANSETRON HCL 4 MG/2ML IJ SOLN: 4.0000 mg | Freq: Three times a day (TID) | INTRAMUSCULAR | Status: DC | PRN

## 2015-10-30 MED ORDER — GABAPENTIN 300 MG PO CAPS
600.0000 mg | ORAL_CAPSULE | Freq: Three times a day (TID) | ORAL | Status: DC
  Administered 2015-10-31 – 2015-11-19 (×56): 600 mg via ORAL
  Filled 2015-10-30 (×41): qty 2
  Filled 2015-10-30: qty 6
  Filled 2015-10-30 (×9): qty 2
  Filled 2015-10-30: qty 6
  Filled 2015-10-30 (×4): qty 2

## 2015-10-30 MED ORDER — LATANOPROST 0.005 % OP SOLN
OPHTHALMIC | Status: AC
  Filled 2015-10-30: qty 2.5

## 2015-10-30 MED ORDER — CIPROFLOXACIN IN D5W 400 MG/200ML IV SOLN: 400.0000 mg | Freq: Once | INTRAVENOUS | Status: DC

## 2015-10-30 MED ORDER — VANCOMYCIN HCL 10 G IV SOLR
2000.0000 mg | Freq: Once | INTRAVENOUS | Status: AC
  Administered 2015-10-30: 2000 mg via INTRAVENOUS
  Filled 2015-10-30: qty 2000

## 2015-10-30 MED ORDER — DULOXETINE HCL 30 MG PO CPEP
30.0000 mg | ORAL_CAPSULE | Freq: Every day | ORAL | Status: DC
  Administered 2015-10-31 – 2015-11-19 (×20): 30 mg via ORAL
  Filled 2015-10-30 (×20): qty 1

## 2015-10-30 MED ORDER — METRONIDAZOLE IN NACL 5-0.79 MG/ML-% IV SOLN
500.0000 mg | Freq: Three times a day (TID) | INTRAVENOUS | Status: DC
  Administered 2015-10-30 – 2015-11-09 (×29): 500 mg via INTRAVENOUS
  Filled 2015-10-30 (×29): qty 100

## 2015-10-30 MED ORDER — VANCOMYCIN 50 MG/ML ORAL SOLUTION
ORAL | Status: AC
  Filled 2015-10-30: qty 10

## 2015-10-30 NOTE — ED Notes (Signed)
Patient states she is unable to give a sample at this time.

## 2015-10-30 NOTE — H&P (Signed)
[facility] History and Physical  Deborah Murray [id number] DOB: 01-19-1961 DOA: 10/30/2015  Referring physician: Dr. Thurnell Garbe PCP: Sol Passer, MD   Chief Complaint: Abd pain/ diarrhea  HPI: Deborah Murray is a 55 y.o. female with history of obestiy, DJD, severe asthma, allergies, HTN, HL, chron back pain , afib and recent back surgery who is presenting to ED with 2 wks history of persistent abd pain and diarrhea.  Patient was dc'd from back surgery about 2 wks ago and says that on the day she got home she started having diarrhea and it hasn't gotten better since.  Was not dc'd on abx.  No back issues.  She has had fevers to 102 in the last few days, sig diffuse abd pain, no nausea or vomiting, not eating, very weak.  No CP/ SOB, no chills, no joint pain, no rash.  CT in ED showed diffuse pancolitis and WBC is high.  Asked to see for admit.  BP's soft in ED, better after IVF's.    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  Gets Medicare and Medicaid insurance.  Lives by herself, has two daughters, never married.  No tob / etoh.  Parents are deceased.      Admit 05-06-15 with afib/ RVR, low K, HTN, severe asthma, OSA , toxic nod goiter, morbid obesity  Admit  Jul 6- Oct 21, 2015 at [facility] for lumbar fusion surgery.  No postop complications per Care Everywhere notes  ROS  denies CP  no joint pain   no HA  no blurry vision  no rash  no diarrhea  no nausea/ vomiting  no dysuria  no difficulty voiding  no change in urine color    Past Medical History  Past Medical History  Diagnosis Date  . Asthma   . Hyperlipidemia   . Hypertension   . Syncope and collapse 05/11/11  adrenal insuffiency  treated at [facility]  . Thyroid disease   . Headache   . Vocal cord dysfunction   . Chronic back pain   . Lumbar radiculopathy   . Adrenal abnormality ([place])   . COPD (chronic obstructive pulmonary disease) ([place])   . Atrial fibrillation ([place])   .  Adrenal insufficiency ([place]) 2013    Tx at [facility]   Past Surgical History  Past Surgical History  Procedure Laterality Date  . Cardiac catheterization  2012 at [facility]  . 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 Abdominal hysterectomy    . Cholecystectomy, laparoscopic    . Eye surg for glaucoma    . Right knee surg    . Cholecystectomy    . Esophagogastroduodenoscopy N/A 09/17/2015    Procedure: ESOPHAGOGASTRODUODENOSCOPY (EGD);  Surgeon: Daneil Dolin, MD;  Location: [facility];  Service: Endoscopy;  Laterality: [id number]  . Back surgery      disc    Family History  Family History  Problem Relation Age of Onset  . Heart attack Mother   . Hypertension Mother   . Heart attack Father   . Asthma Father   . Hyperlipidemia Father   . Hypertension Father   . Heart attack Sister   . Arrhythmia Sister   . Asthma Sister   . Hyperlipidemia Sister   . Hypertension Sister   . Asthma Brother   . Hypertension Brother    Social History  reports that she has never  smoked. She has never used smokeless tobacco. She reports that she does not drink alcohol or use illicit drugs. Allergies  Allergies  Allergen Reactions  . Aspirin Shortness Of Breath and Palpitations  . Codeine Sulfate Shortness Of Breath and Palpitations  . Darvocet [Propoxyphene N-Acetaminophen] Shortness Of Breath and Palpitations  . Tramadol Shortness Of Breath  . Penicillins Other (See Comments)    Has patient had a PCN reaction causing immediate rash, facial/tongue/throat swelling, SOB or lightheadedness with hypotension: No Has patient had a PCN reaction causing severe rash involving mucus membranes or skin necrosis: No Has patient had a PCN reaction that required hospitalization No Has patient had a PCN reaction occurring within the last 10 years: Yes If all of the above answers are "NO", then may proceed with Cephalosporin use.    Home medications Prior to Admission medications   Medication Sig  Start Date End Date Taking? Authorizing Provider  atorvastatin (LIPITOR) 20 MG tablet Take 20 mg by mouth every evening.    Yes Historical Provider, MD  clindamycin (CLEOCIN T) 1 % external solution Apply 1 application topically every evening. Applies to underarms 11/08/09  Yes Historical Provider, MD  fluticasone (FLOVENT HFA) 220 MCG/ACT inhaler Inhale 2 puffs into the lungs every 4 (four) hours. 06/24/09  Yes Historical Provider, MD  ipratropium (ATROVENT) 0.02 % nebulizer solution Inhale 2 mLs into the lungs 4 (four) times daily. 06/16/15  Yes Historical Provider, MD  latanoprost (XALATAN) 0.005 % ophthalmic solution Place 1 drop into both eyes at bedtime.   Yes Historical Provider, MD  levocetirizine (XYZAL) 5 MG tablet Take 5 mg by mouth every evening. 06/04/15  Yes Historical Provider, MD  omalizumab Arvid Right) 150 MG injection Inject 300 mg into the skin every 28 (twenty-eight) days.  06/11/15  Yes Historical Provider, MD  albuterol (PROVENTIL HFA;VENTOLIN HFA) 108 (90 BASE) MCG/ACT inhaler Inhale 2 puffs into the lungs every 4 (four) hours as needed for wheezing.     Historical Provider, MD  albuterol (PROVENTIL) (2.5 MG/3ML) 0.083% nebulizer solution Take 2.5 mg by nebulization every 6 (six) hours as needed for wheezing or shortness of breath.    Historical Provider, MD  aluminum chloride (DRYSOL) 20 % external solution Apply 1 application topically at bedtime as needed (for sweating and applies under arms).     Historical Provider, MD  calcium citrate (CALCITRATE - DOSED IN MG ELEMENTAL CALCIUM) 950 MG tablet Take 1 tablet by mouth daily.    Historical Provider, MD  Cholecalciferol (VITAMIN D) 2000 UNITS tablet Take 2,000 Units by mouth daily.    Historical Provider, MD  citalopram (CELEXA) 20 MG tablet Take 20 mg by mouth at bedtime.     Historical Provider, MD  DULoxetine (CYMBALTA) 30 MG capsule Take 30 mg by mouth daily.  08/10/15   Historical Provider, MD  EPINEPHrine (EPIPEN JR) 0.15 MG/0.3ML  injection Inject 0.15 mg into the muscle Once PRN. 06/11/15   Historical Provider, MD  fluticasone (FLONASE) 50 MCG/ACT nasal spray Place 2 sprays in each nostril daily as needed for allergies 06/12/15   Historical Provider, MD  gabapentin (NEURONTIN) 300 MG capsule Take 900 mg by mouth 3 (three) times daily.     Historical Provider, MD  HYDROcodone-acetaminophen (NORCO) 5-325 MG tablet Take 1-2 tablets by mouth every 4 (four) hours as needed for moderate pain or severe pain.  08/30/15   Historical Provider, MD  lisinopril-hydrochlorothiazide (PRINZIDE,ZESTORETIC) 20-25 MG per tablet Take 1 tablet by mouth daily.    Historical Provider,  MD  metoprolol tartrate (LOPRESSOR) 25 MG tablet Take 12.5 mg by mouth 2 (two) times daily. 07/18/15   Historical Provider, MD  montelukast (SINGULAIR) 10 MG tablet Take 10 mg by mouth at bedtime.    Historical Provider, MD  Multiple Vitamin (MULTIVITAMIN) capsule Take 1 capsule by mouth daily.    Historical Provider, MD  nitroGLYCERIN (NITROSTAT) 0.4 MG SL tablet Place 0.4 mg under the tongue every 5 (five) minutes as needed for chest pain.    Historical Provider, MD  ondansetron (ZOFRAN ODT) 8 MG disintegrating tablet Take 1 tablet (8 mg total) by mouth every 8 (eight) hours as needed for nausea or vomiting. Patient not taking: Reported on 08/28/2015 06/08/15   Evalee Jefferson, PA-C  ondansetron (ZOFRAN) 4 MG tablet Take 1 tablet (4 mg total) by mouth every 8 (eight) hours as needed for nausea or vomiting. 08/28/15   Carlis Stable, NP  phentermine 15 MG capsule Take 15 mg by mouth as needed (severe nausea and vomitting).     Historical Provider, MD  potassium chloride SA (K-DUR,KLOR-CON) 20 MEQ tablet Take 20 mEq by mouth 2 (two) times daily.    Historical Provider, MD  predniSONE (DELTASONE) 10 MG tablet Take 10 mg by mouth daily.  07/19/15   Historical Provider, MD  promethazine (PHENERGAN) 25 MG tablet Take 25 mg by mouth every 6 (six) hours as needed for nausea or vomiting.   06/26/09   Historical Provider, MD  rivaroxaban (XARELTO) 20 MG TABS tablet Take 1 tablet (20 mg total) by mouth daily with supper. 05/13/14   Modena Jansky, MD  tiotropium (SPIRIVA HANDIHALER) 18 MCG inhalation capsule Place 1 capsule into inhaler and inhale daily. 12/09/09   Historical Provider, MD  VOLTAREN 1 % GEL Apply 2 g topically as needed (for OA pain).  08/02/15   Historical Provider, MD   Liver Function Tests  Recent Labs Lab 10/30/15 1239  AST 16  ALT 13*  ALKPHOS 88  BILITOT 1.1  PROT 4.8*  ALBUMIN 2.1*    Recent Labs Lab 10/30/15 1057  LIPASE 9*   CBC  Recent Labs Lab 10/30/15 1057  WBC 23.7*  NEUTROABS 21.9*  HGB 10.8*  HCT 32.6*  MCV 85.6  PLT [id number]*   Basic Metabolic Panel  Recent Labs Lab 10/30/15 1057  NA 135  K 2.1*  CL 100*  CO2 27  GLUCOSE 86  BUN 19  CREATININE 1.36*  CALCIUM 7.1*     Filed Vitals:   10/30/15 1800 10/30/15 1830 10/30/15 1906 10/30/15 1909  BP: 115/83 99/75 93/66    Pulse: 81 79 85   Temp:    98.3 F (36.8 C)  TempSrc:    Oral  Resp: 25 24 23    Height:      Weight:      SpO2: 97% 96% 98%    Exam: Gen somnolent AAF, arouses easily, not in distress, chron ill appearing and sig obese No rash, cyanosis or gangrene Sclera anicteric, throat clear  No jvd or bruits Chest clear bilat RRR no MRG Abd obese, mildly distended, no hsm, diffuse tenderness w vol guarding, dec'd BS Back wound is intact except small area at the end of the wound where wound is opened, no foul odor or drainage, no erythema GU foley in place MS no joint effusions or deformity Ext no LE edema / no wounds or ulcers Neuro is alert, Ox 3 , nf   Na 135  K 2.1  CO2 27  BUN 19  Cr 1.36   Ca 7.1  Alb 2.1  LFT"s ok Tbili 1.1 Lipase 9  Trop < 0.03   WBC 23k  Hb 10.8  plt 493 UA negative, 1.025  EKG (independ reviewed) > sinus tach 106 bpm, early repol changes, no acute CXR (independ reviewed) > low lung vol's w/o acute findings CT abd >  Diffuse edematous wall thickening in the colon, from the cecal tip to the rectum associated with diffuse pericolonic edema/ inflammation. Imaging features are compatible with infectious/inflammatory pancolitis.   Assessment: 1.  Abd pain/ diarrhea/^WBC/ pancolitis by CT - admit, prob has Cdif infection. Send stool studies. Diarrhea started just after returning home from 4d hosp stay at [facility] for lumbar fusion surgery.  Plan is admit to SDU, IVF's, cautious IV pain meds, IV steroids (hx adrenal insuff on pred). Have d/w GI on call, w Rx with IV Flagyl 500 q 8 and po vanc 250 qid.   2.  Afib - hasn't taken Xarelto in weeks.  Is in NSR.  No anticoag for now.  3.  Adrenal insuff - IV hydrocort for now 4.  Hypotension - improving w IVF 5.  Hx HTN -- hold meds 6.  Hist allergies/asthma - on monthly injections of omalizumab 7.  Hypokalemia - due to diarrhea most likely  Plan - as above     Sol Blazing [facility] Pager [phone]  Cell [phone]  If 7PM-7AM, please contact night-coverage [email or web address] Password [email or web address] 10/30/2015, 7:39 PM

## 2015-10-30 NOTE — ED Provider Notes (Signed)
CSN: [id number]     Arrival date & time 10/30/15  1016 History   First MD Initiated Contact with Patient 10/30/15 1232     Chief Complaint  Patient presents with  . Abdominal Pain      HPI Pt was seen at 1230.  Per pt, c/o gradual onset and persistence of multiple intermittent episodes of N/V/D for the past 1 week. has been associated with generalized abd "pain," "malodorous urine," and home fevers to "102." Pt states she has been unable to take her meds due to N/V, including her steroid for her hx adrenal insufficiency. EMS states pt's BP was "81/47" and HR was "120's" on scene; pt was given IV NS 458ml bolus en route with BP improving to "120/70." Denies CP/SOB, no back pain, no black or blood in stools or emesis.    Past Medical History  Diagnosis Date  . Asthma   . Hyperlipidemia   . Hypertension   . Syncope and collapse 05/11/11  adrenal insuffiency  treated at [facility]  . Thyroid disease   . Headache   . Vocal cord dysfunction   . Chronic back pain   . Lumbar radiculopathy   . Adrenal abnormality ([place])   . COPD (chronic obstructive pulmonary disease) ([place])   . Atrial fibrillation ([place])   . Adrenal insufficiency ([place]) 2013    Tx at [facility]   Past Surgical History  Procedure Laterality Date  . Cardiac catheterization  2012 at [facility]  . 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 Abdominal hysterectomy    . Cholecystectomy, laparoscopic    . Eye surg for glaucoma    . Right knee surg    . Cholecystectomy    . Esophagogastroduodenoscopy N/A 09/17/2015    Procedure: ESOPHAGOGASTRODUODENOSCOPY (EGD);  Surgeon: Daneil Dolin, MD;  Location: [facility];  Service: Endoscopy;  Laterality: [id number]  . Back surgery      disc    Family History  Problem Relation Age of Onset  . Heart attack Mother   . Hypertension Mother   . Heart attack Father   . Asthma Father   . Hyperlipidemia Father   . Hypertension Father   . Heart attack Sister   . Arrhythmia Sister   .  Asthma Sister   . Hyperlipidemia Sister   . Hypertension Sister   . Asthma Brother   . Hypertension Brother    Social History  Substance Use Topics  . Smoking status: Never Smoker   . Smokeless tobacco: Never Used  . Alcohol Use: No    Review of Systems ROS: Statement: All systems negative except as marked or noted in the HPI; Constitutional: +fever and chills. ; ; Eyes: Negative for eye pain, redness and discharge. ; ; ENMT: Negative for ear pain, hoarseness, nasal congestion, sinus pressure and sore throat. ; ; Cardiovascular: Negative for chest pain, palpitations, diaphoresis, dyspnea and peripheral edema. ; ; Respiratory: Negative for cough, wheezing and stridor. ; ; Gastrointestinal: +N/V/D, abd pain. Negative for blood in stool, hematemesis, jaundice and rectal bleeding. . ; ; Genitourinary: +"malodorous urine." Negative for dysuria, flank pain and hematuria. ; ; Musculoskeletal: Negative for back pain and neck pain. Negative for swelling and trauma.; ; Skin: Negative for pruritus, rash, abrasions, blisters, bruising and skin lesion.; ; Neuro: Negative for headache, lightheadedness and neck stiffness. Negative for weakness, altered level of consciousness, altered mental status, extremity weakness, paresthesias, involuntary movement, seizure and syncope.  Allergies  Aspirin; Codeine sulfate; Darvocet; Tramadol; and Penicillins  Home Medications   Prior to Admission medications   Medication Sig Start Date End Date Taking? Authorizing Provider  atorvastatin (LIPITOR) 20 MG tablet Take 20 mg by mouth every evening.    Yes Historical Provider, MD  clindamycin (CLEOCIN T) 1 % external solution Apply 1 application topically every evening. Applies to underarms 11/08/09  Yes Historical Provider, MD  fluticasone (FLOVENT HFA) 220 MCG/ACT inhaler Inhale 2 puffs into the lungs every 4 (four) hours. 06/24/09  Yes Historical Provider, MD  ipratropium (ATROVENT) 0.02 % nebulizer solution Inhale  2 mLs into the lungs 4 (four) times daily. 06/16/15  Yes Historical Provider, MD  latanoprost (XALATAN) 0.005 % ophthalmic solution Place 1 drop into both eyes at bedtime.   Yes Historical Provider, MD  levocetirizine (XYZAL) 5 MG tablet Take 5 mg by mouth every evening. 06/04/15  Yes Historical Provider, MD  omalizumab Arvid Right) 150 MG injection Inject 300 mg into the skin every 28 (twenty-eight) days.  06/11/15  Yes Historical Provider, MD  albuterol (PROVENTIL HFA;VENTOLIN HFA) 108 (90 BASE) MCG/ACT inhaler Inhale 2 puffs into the lungs every 4 (four) hours as needed for wheezing.     Historical Provider, MD  albuterol (PROVENTIL) (2.5 MG/3ML) 0.083% nebulizer solution Take 2.5 mg by nebulization every 6 (six) hours as needed for wheezing or shortness of breath.    Historical Provider, MD  aluminum chloride (DRYSOL) 20 % external solution Apply 1 application topically at bedtime as needed (for sweating and applies under arms).     Historical Provider, MD  calcium citrate (CALCITRATE - DOSED IN MG ELEMENTAL CALCIUM) 950 MG tablet Take 1 tablet by mouth daily.    Historical Provider, MD  Cholecalciferol (VITAMIN D) 2000 UNITS tablet Take 2,000 Units by mouth daily.    Historical Provider, MD  citalopram (CELEXA) 20 MG tablet Take 20 mg by mouth at bedtime.     Historical Provider, MD  DULoxetine (CYMBALTA) 30 MG capsule Take 30 mg by mouth daily.  08/10/15   Historical Provider, MD  EPINEPHrine (EPIPEN JR) 0.15 MG/0.3ML injection Inject 0.15 mg into the muscle Once PRN. 06/11/15   Historical Provider, MD  fluticasone (FLONASE) 50 MCG/ACT nasal spray Place 2 sprays in each nostril daily as needed for allergies 06/12/15   Historical Provider, MD  gabapentin (NEURONTIN) 300 MG capsule Take 900 mg by mouth 3 (three) times daily.     Historical Provider, MD  HYDROcodone-acetaminophen (NORCO) 5-325 MG tablet Take 1-2 tablets by mouth every 4 (four) hours as needed for moderate pain or severe pain.  08/30/15    Historical Provider, MD  lisinopril-hydrochlorothiazide (PRINZIDE,ZESTORETIC) 20-25 MG per tablet Take 1 tablet by mouth daily.    Historical Provider, MD  metoprolol tartrate (LOPRESSOR) 25 MG tablet Take 12.5 mg by mouth 2 (two) times daily. 07/18/15   Historical Provider, MD  montelukast (SINGULAIR) 10 MG tablet Take 10 mg by mouth at bedtime.    Historical Provider, MD  Multiple Vitamin (MULTIVITAMIN) capsule Take 1 capsule by mouth daily.    Historical Provider, MD  nitroGLYCERIN (NITROSTAT) 0.4 MG SL tablet Place 0.4 mg under the tongue every 5 (five) minutes as needed for chest pain.    Historical Provider, MD  ondansetron (ZOFRAN ODT) 8 MG disintegrating tablet Take 1 tablet (8 mg total) by mouth every 8 (eight) hours as needed for nausea or vomiting. Patient not taking: Reported on 08/28/2015 06/08/15   Evalee Jefferson, PA-C  ondansetron [facility])  4 MG tablet Take 1 tablet (4 mg total) by mouth every 8 (eight) hours as needed for nausea or vomiting. 08/28/15   Carlis Stable, NP  phentermine 15 MG capsule Take 15 mg by mouth as needed (severe nausea and vomitting).     Historical Provider, MD  potassium chloride SA (K-DUR,KLOR-CON) 20 MEQ tablet Take 20 mEq by mouth 2 (two) times daily.    Historical Provider, MD  predniSONE (DELTASONE) 10 MG tablet Take 10 mg by mouth daily.  07/19/15   Historical Provider, MD  promethazine (PHENERGAN) 25 MG tablet Take 25 mg by mouth every 6 (six) hours as needed for nausea or vomiting.  06/26/09   Historical Provider, MD  rivaroxaban (XARELTO) 20 MG TABS tablet Take 1 tablet (20 mg total) by mouth daily with supper. 05/13/14   Modena Jansky, MD  tiotropium (SPIRIVA HANDIHALER) 18 MCG inhalation capsule Place 1 capsule into inhaler and inhale daily. 12/09/09   Historical Provider, MD  VOLTAREN 1 % GEL Apply 2 g topically as needed (for OA pain).  08/02/15   Historical Provider, MD   BP 127/102 mmHg  Pulse 94  Temp(Src) 99.9 F (37.7 C) (Oral)  Resp 20  Ht 5\' 2"   (1.575 m)  Wt 230 lb (104.327 kg)  BMI 42.06 kg/m2  SpO2 99%   Patient Vitals for the past 24 hrs:  BP Temp Temp src Pulse Resp SpO2 Height Weight  10/30/15 1710 96/66 mmHg 98.3 F (36.8 C) Oral 85 (!) 28 99 % - -  10/30/15 1600 107/70 mmHg - - 92 23 97 % - -  10/30/15 1530 107/72 mmHg - - 95 21 98 % - -  10/30/15 1515 100/79 mmHg - - 97 24 98 % - -  10/30/15 1430 129/83 mmHg - - 93 (!) 29 100 % - -  10/30/15 1400 (!) 127/102 mmHg 99.9 F (37.7 C) Oral 94 20 99 % - -  10/30/15 1400 (!) 127/102 mmHg - - 94 20 100 % - -  10/30/15 1330 110/82 mmHg - - 99 23 99 % - -  10/30/15 1300 96/74 mmHg - - 101 20 97 % - -  10/30/15 1243 98/61 mmHg 102.3 F (39.1 C) Rectal 106 23 98 % - -  10/30/15 1230 98/61 mmHg - - 108 - 98 % - -  10/30/15 1224 95/63 mmHg 100 F (37.8 C) Oral 108 18 100 % - -  10/30/15 1030 103/66 mmHg - - 97 - 98 % - -  10/30/15 1020 110/69 mmHg 99.7 F (37.6 C) Oral 102 16 98 % 5\' 2"  (1.575 m) 230 lb (104.327 kg)     Physical Exam  1235: Physical examination:  Nursing notes reviewed; Vital signs and O2 SAT reviewed; +febrile.;; Constitutional: Well developed, Well nourished, Well hydrated, In no acute distress; Head:  Normocephalic, atraumatic; Eyes: EOMI, PERRL, No scleral icterus; ENMT: Mouth and pharynx normal, Mucous membranes moist; Neck: Supple, Full range of motion, No lymphadenopathy; Cardiovascular: Tachycardic rate and rhythm, No gallop; Respiratory: Breath sounds clear & equal bilaterally, No wheezes.  Speaking full sentences with ease, Normal respiratory effort/excursion; Chest: Nontender, Movement normal; Abdomen: Soft, +diffuse tenderness to palp. Nondistended, Normal bowel sounds; Genitourinary: No CVA tenderness; Extremities: Pulses normal, No tenderness, No edema, No calf edema or asymmetry.; Spine:  No midline CS, TS, LS tenderness. +small open area with visualized SQ tissue distal midline lumbar surgical wound, no purulent drainage, no bleeding, no erythema.  +multiple superficial abrasions to bilat lumbar  areas. ;;. Neuro: AA&Ox3, Major CN grossly intact.  Speech clear. No gross focal motor or sensory deficits in extremities.; Skin: Color normal, Warm, Dry.   ED Course  Procedures (including critical care time) Labs Review Imaging Review  I have personally reviewed and evaluated these images and lab results as part of my medical decision-making.   EKG Interpretation   Date/Time:  Wednesday October 30 2015 12:43:22 EDT Ventricular Rate:  106 PR Interval:    QRS Duration: 88 QT Interval:  349 QTC Calculation: 464 R Axis:   12 Text Interpretation:  Sinus tachycardia Abnormal R-wave progression, early  transition Borderline repolarization abnormality When compared with ECG of  09/01/2015 Rate faster Confirmed by [facility]  MD, Nunzio Cory [phone]) on  10/30/2015 2:33:44 PM      MDM  MDM Reviewed: previous chart, nursing note and vitals Reviewed previous: labs and ECG Interpretation: labs, ECG, x-ray and CT scan Total time providing critical care: 30-74 minutes. This excludes time spent performing separately reportable procedures and services. Consults: admitting MD   CRITICAL CARE Performed by: Alfonzo Feller Total critical care time: 35 minutes Critical care time was exclusive of separately billable procedures and treating other patients. Critical care was necessary to treat or prevent imminent or life-threatening deterioration. Critical care was time spent personally by me on the following activities: development of treatment plan with patient and/or surrogate as well as nursing, discussions with consultants, evaluation of patient's response to treatment, examination of patient, obtaining history from patient or surrogate, ordering and performing treatments and interventions, ordering and review of laboratory studies, ordering and review of radiographic studies, pulse oximetry and re-evaluation of patient's condition.    Results for  orders placed or performed during the hospital encounter of 10/30/15  CBC with Differential  Result Value Ref Range   WBC 23.7 (H) 4.0 - 10.5 K/uL   RBC 3.81 (L) 3.87 - 5.11 MIL/uL   Hemoglobin 10.8 (L) 12.0 - 15.0 g/dL   HCT 32.6 (L) 36.0 - 46.0 %   MCV 85.6 78.0 - 100.0 fL   MCH 28.3 26.0 - 34.0 pg   MCHC 33.1 30.0 - 36.0 g/dL   RDW 15.0 11.5 - 15.5 %   Platelets 493 (H) 150 - 400 K/uL   Neutrophils Relative % 93 %   Neutro Abs 21.9 (H) 1.7 - 7.7 K/uL   Lymphocytes Relative 2 %   Lymphs Abs 0.4 (L) 0.7 - 4.0 K/uL   Monocytes Relative 6 %   Monocytes Absolute 1.3 (H) 0.1 - 1.0 K/uL   Eosinophils Relative 0 %   Eosinophils Absolute 0.0 0.0 - 0.7 K/uL   Basophils Relative 0 %   Basophils Absolute 0.0 0.0 - 0.1 K/uL  Basic metabolic panel  Result Value Ref Range   Sodium 135 135 - 145 mmol/L   Potassium 2.1 (LL) 3.5 - 5.1 mmol/L   Chloride 100 (L) 101 - 111 mmol/L   CO2 27 22 - 32 mmol/L   Glucose, Bld 86 65 - 99 mg/dL   BUN 19 6 - 20 mg/dL   Creatinine, Ser 1.36 (H) 0.44 - 1.00 mg/dL   Calcium 7.1 (L) 8.9 - 10.3 mg/dL   GFR calc non Af Amer 42 (L) >60 mL/min   GFR calc Af Amer 49 (L) >60 mL/min   Anion gap 8 5 - 15  Lipase, blood  Result Value Ref Range   Lipase 9 (L) 11 - 51 U/L  Urinalysis, Routine w reflex microscopic (not at [facility])  Result  Value Ref Range   Color, Urine YELLOW YELLOW   APPearance HAZY (A) CLEAR   Specific Gravity, Urine 1.025 1.005 - 1.030   pH 6.0 5.0 - 8.0   Glucose, UA NEGATIVE NEGATIVE mg/dL   Hgb urine dipstick NEGATIVE NEGATIVE   Bilirubin Urine SMALL (A) NEGATIVE   Ketones, ur TRACE (A) NEGATIVE mg/dL   Protein, ur NEGATIVE NEGATIVE mg/dL   Nitrite NEGATIVE NEGATIVE   Leukocytes, UA NEGATIVE NEGATIVE  Hepatic function panel  Result Value Ref Range   Total Protein 4.8 (L) 6.5 - 8.1 g/dL   Albumin 2.1 (L) 3.5 - 5.0 g/dL   AST 16 15 - 41 U/L   ALT 13 (L) 14 - 54 U/L   Alkaline Phosphatase 88 38 - 126 U/L   Total Bilirubin 1.1 0.3 - 1.2  mg/dL   Bilirubin, Direct 0.4 0.1 - 0.5 mg/dL   Indirect Bilirubin 0.7 0.3 - 0.9 mg/dL  Troponin I  Result Value Ref Range   Troponin I <0.03 <0.03 ng/mL  Lactic acid, plasma  Result Value Ref Range   Lactic Acid, Venous 1.8 0.5 - 1.9 mmol/L  Lactic acid, plasma  Result Value Ref Range   Lactic Acid, Venous 1.5 0.5 - 1.9 mmol/L  Magnesium  Result Value Ref Range   Magnesium 1.7 1.7 - 2.4 mg/dL   Ct Abdomen Pelvis Wo Contrast 10/30/2015  CLINICAL DATA:  Nausea with vomiting and diarrhea. Abdominal pain for 1 week. EXAM: CT ABDOMEN AND PELVIS WITHOUT CONTRAST TECHNIQUE: Multidetector CT imaging of the abdomen and pelvis was performed following the standard protocol without IV contrast. COMPARISON:  None. FINDINGS: Lower chest:  Bilateral dependent atelectasis. Hepatobiliary: No focal abnormality in the liver on this study without intravenous contrast. No evidence of hepatomegaly. Gallbladder surgically absent. No intrahepatic or extrahepatic biliary dilation. Pancreas: No focal mass lesion. No dilatation of the main duct. No intraparenchymal cyst. No peripancreatic edema. Spleen: No splenomegaly. No focal mass lesion. Adrenals/Urinary Tract: No adrenal nodule or mass. No hydronephrosis in either kidney. Uninfused imaging shows no focal abnormality of either kidney. No hydroureter. Foley catheter decompresses the urinary bladder. Stomach/Bowel: Patient is status post gastric bypass surgery. Duodenum is normally positioned as is the ligament of Treitz. No small bowel wall thickening. No small bowel dilatation. Terminal ileum unremarkable. Appendix not well seen. Marked circumferential wall thickening and edema is seen in the colon diffusely, from the cecal tip to the level of the rectum. This is associated with diffuse pericolonic edema/ inflammation. Vascular/Lymphatic: There is abdominal aortic atherosclerosis without aneurysm. There is no gastrohepatic or hepatoduodenal ligament lymphadenopathy. No  intraperitoneal or retroperitoneal lymphadenopathy. No pelvic sidewall lymphadenopathy. Reproductive: The uterus is surgically absent. There is no adnexal mass. Other: No substantial intraperitoneal free fluid. Musculoskeletal: Patient is status post lower lumbar fusion. Bone windows reveal no worrisome lytic or sclerotic osseous lesions. IMPRESSION: Diffuse edematous wall thickening in the colon, from the cecal tip to the rectum associated with diffuse pericolonic edema/ inflammation. Imaging features are compatible with infectious/inflammatory pancolitis. Electronically Signed   By: Misty Stanley M.D.   On: 10/30/2015 17:03   Dg Chest Port 1 View  10/30/2015  CLINICAL DATA:  Abdominal pain with nausea vomiting and diarrhea. EXAM: PORTABLE CHEST 1 VIEW COMPARISON:  07/06/2015. FINDINGS: 1249 hours. Lung volumes are low. The lungs are clear wiithout focal pneumonia, edema, pneumothorax or pleural effusion. Cardiopericardial silhouette is at upper limits of normal for size. The visualized bony structures of the thorax are intact. Telemetry leads overlie  the chest. IMPRESSION: Low volume film without acute cardiopulmonary findings. Electronically Signed   By: Misty Stanley M.D.   On: 10/30/2015 13:00    1800:  Shortly after arrival: pt's SBP began to drop and temp increased to 102.3. Code Sepsis called, IVF bolus given as well as IV abx after BC and UC obtained.  IV steroid given for hx adrenal insufficiency.  Potassium repleted IV. APAP given for fever. Dx and testing d/w pt.  Questions answered.  Verb understanding, agreeable to admit. T/C to [facility] Dr. Jonnie Finner, case discussed, including:  HPI, pertinent PM/SHx, VS/PE, dx testing, ED course and treatment:  Agreeable to admit, requests to write temporary orders, obtain medical bed to team APAdmits.   Francine Graven, DO 11/02/15 2140

## 2015-10-30 NOTE — ED Notes (Signed)
Pt c/o potassium causing burning in her IV site, rate decreased again to 30 ml/hr.

## 2015-10-30 NOTE — ED Notes (Signed)
Multiple attempts by nurses to obtain IV sites. Lab at bedside.

## 2015-10-30 NOTE — ED Notes (Signed)
Clean dressings applied to wounds. Placed with non adherent gauze and paper tape.

## 2015-10-30 NOTE — ED Notes (Signed)
Per EMS, pt c/o abd pain, n/v/d, and malodorous urine x 5 days. EMS reports pt initially was hypotensive and tachycardic. Pt BP 81/47, HR in 120s. Pt given approx 400 cc of NS and HR decreased to 62 and BP 120/70.

## 2015-10-30 NOTE — ED Notes (Signed)
Report given to receiving RN. Pt ready for transfer.

## 2015-10-30 NOTE — ED Notes (Signed)
Critical value of K+ 2.1 given to Dr. Lacinda Axon.

## 2015-10-30 NOTE — ED Notes (Signed)
Daughter, Mikela Mayoral [phone]. OK per pt to give information.

## 2015-10-31 DIAGNOSIS — I10 Essential (primary) hypertension: Secondary | ICD-10-CM

## 2015-10-31 DIAGNOSIS — A0472 Enterocolitis due to Clostridium difficile, not specified as recurrent: Secondary | ICD-10-CM | POA: Diagnosis present

## 2015-10-31 LAB — C DIFFICILE QUICK SCREEN W PCR REFLEX
C DIFFICILE (CDIFF) INTERP: DETECTED
C Diff antigen: POSITIVE — AB
C Diff toxin: POSITIVE — AB

## 2015-10-31 LAB — URINE CULTURE: Culture: NO GROWTH

## 2015-10-31 LAB — BASIC METABOLIC PANEL
ANION GAP: 10 (ref 5–15)
BUN: 22 mg/dL — ABNORMAL HIGH (ref 6–20)
CHLORIDE: 107 mmol/L (ref 101–111)
CO2: 21 mmol/L — AB (ref 22–32)
Calcium: 6.6 mg/dL — ABNORMAL LOW (ref 8.9–10.3)
Creatinine, Ser: 1.12 mg/dL — ABNORMAL HIGH (ref 0.44–1.00)
GFR calc non Af Amer: 53 mL/min — ABNORMAL LOW (ref >60)
Glucose, Bld: 109 mg/dL — ABNORMAL HIGH (ref 65–99)
Potassium: 3.1 mmol/L — ABNORMAL LOW (ref 3.5–5.1)
Sodium: 138 mmol/L (ref 135–145)

## 2015-10-31 LAB — CBC
HEMATOCRIT: 37.2 % (ref 36.0–46.0)
HEMOGLOBIN: 12.3 g/dL (ref 12.0–15.0)
MCH: 28.1 pg (ref 26.0–34.0)
MCHC: 33.1 g/dL (ref 30.0–36.0)
MCV: 84.9 fL (ref 78.0–100.0)
Platelets: 429 10*3/uL — ABNORMAL HIGH (ref 150–400)
RBC: 4.38 MIL/uL (ref 3.87–5.11)
RDW: 15.5 % (ref 11.5–15.5)
WBC: 25.9 10*3/uL — ABNORMAL HIGH (ref 4.0–10.5)

## 2015-10-31 MED ORDER — HYDROMORPHONE HCL 1 MG/ML IJ SOLN
0.5000 mg | INTRAMUSCULAR | Status: DC | PRN
  Administered 2015-10-31 – 2015-11-14 (×48): 1 mg via INTRAVENOUS
  Filled 2015-10-31 (×49): qty 1

## 2015-10-31 MED ORDER — POTASSIUM CHLORIDE IN NACL 40-0.9 MEQ/L-% IV SOLN
INTRAVENOUS | Status: DC
  Administered 2015-10-31 – 2015-11-01 (×2): 125 mL/h via INTRAVENOUS

## 2015-10-31 MED ORDER — HEPARIN SODIUM (PORCINE) 5000 UNIT/ML IJ SOLN
5000.0000 [IU] | Freq: Three times a day (TID) | INTRAMUSCULAR | Status: DC
  Administered 2015-10-31 – 2015-11-12 (×36): 5000 [IU] via SUBCUTANEOUS
  Filled 2015-10-31 (×36): qty 1

## 2015-10-31 NOTE — Progress Notes (Addendum)
Initial Nutrition Assessment  DOCUMENTATION CODES:  Morbid obesity  INTERVENTION:  Food requests noted  Pt reports she is too sick at this time to eat anything. EXpect return of normal appetite once health returns. Will monitor for length of time w/o nutrition and can add supplements if warranted    NUTRITION DIAGNOSIS:  Inadequate oral intake related to nausea, acute illness and diarrhea evidenced by an estimated energy intake that met < or equal to 50% of needs for > or equal to 1 month.  GOAL:  Patient will meet greater than or equal to 90% of their needs  MONITOR:  PO intake, Diet advancement, Supplement acceptance, Labs, I & O's  REASON FOR ASSESSMENT:  Malnutrition Screening Tool    ASSESSMENT:  55 y/o female history obesity, DJD, allergies, HTN, HLD, chronic pain, afib, recent back surgery. Presents with 2 week history of abdominal pain and diarrhea which began after recent back surgery. CT in ED showed diffuse pancolitis. Admitted for management. Work up is positive for CDIFF  On RD arrival, pt was feeling very unwell and did not wish to speak for any extended amount of time. She reports she has been eating "nothing" for the past month, no meals. She denies even consuming small snacks. She says she tried to drink fluids and stay hydrated. The major inhibitor of PO intake was severe nausea when she tried to eat. She says even water would cause nausea.   She says she has lost 30 lbs in the last month however upon chart review, her current weight shows in increase in her weight. The initial weight of 230 lbs was likely reported. Wt history does show she may have lost 15 lbs in the last year, but this is not clinically significant.   RD took food preferences though she says she has no appetite whatsoever at this time.   NFPE: Not conducted   Labs reviewed: CDIFF positive, WBC: 25.9, albumin: 2.1, total pro: 4.8   Recent Labs Lab 10/30/15 1057 10/31/15 0547  NA 135 138  K  2.1* 3.1*  CL 100* 107  CO2 27 21*  BUN 19 22*  CREATININE 1.36* 1.12*  CALCIUM 7.1* 6.6*  MG 1.7  --   GLUCOSE 86 109*   Diet Order:  Diet clear liquid Room service appropriate?: Yes; Fluid consistency:: Thin  Skin: Surgical wound mid back, multiple open skin/wounds on back, wound to buttocks   Last BM:  7/20- diarhea   Height:  Ht Readings from Last 1 Encounters:  10/30/15 5' 2"  (1.575 m)   Weight:  Wt Readings from Last 1 Encounters:  10/31/15 246 lb 9.6 oz (111.857 kg)   Wt Readings from Last 10 Encounters:  10/31/15 246 lb 9.6 oz (111.857 kg)  09/17/15 247 lb (112.038 kg)  08/28/15 247 lb 12.8 oz (112.401 kg)  08/26/15 240 lb (108.863 kg)  06/21/15 253 lb (114.76 kg)  06/07/15 236 lb (107.049 kg)  10/30/14 260 lb (117.935 kg)  09/11/14 262 lb 5.6 oz (119 kg)  05/13/14 257 lb 9.6 oz (116.847 kg)  11/26/12 235 lb (106.595 kg)   Ideal Body Weight:  50 kg  BMI:  Body mass index is 45.09 kg/(m^2).  Estimated Nutritional Needs:  Kcal:  1400-1600 kcals Protein:  55-65 g Pro (1.1-1.3 g/kg ibw) Fluid:  1.4-1.6 liters fluid  EDUCATION NEEDS:  No education needs identified at this time  Burtis Junes RD, LDN, [place] [facility] Pager: [phone] 10/31/2015 3:04 PM

## 2015-10-31 NOTE — Progress Notes (Signed)
PROGRESS NOTE    Deborah Murray  [id number] DOB: 01-07-1961 DOA: 10/30/2015 PCP: Sol Passer, MD    Brief Narrative:  Patient is a 55 y.o. female with history of atrial fibrillation, HTN severe asthma/allergies, adrenal insufficiency, and recent back surgery, who presented to the ED on 10/30/2015 with a chief complaint of abdominal pain and diarrhea. She was discharged from the hospital from back surgery approximately 2 weeks ago. When she got home, she started having loose stools. She does not recall being treated with an antibiotic during the hospitalization for back surgery. She reported fevers at home, but no nausea or vomiting. She had felt very weak.  In the ED, she was febrile with a temperature of 102.3. Her blood pressure was on the low side of normal. Her white blood cell count was 23.7. CT of her abdomen and pelvis revealed diffuse edematous wall thickening in the colon from the cecal tip to the rectum associated with diffuse pericolonic edema/inflammation; compatible with infectious/inflammatory pancolitis. She was admitted for further evaluation and management.  Assessment & Plan:   Principal Problem:   Pancolitis ([place]) Active Problems:   C. difficile diarrhea   C. difficile colitis   Hypokalemia   Essential hypertension   Severe persistent asthma   Morbid obesity (HCC)   Hypotension   History of atrial fibrillation- NSR now   History of adrenal insufficiency   History of hypertension   1. Pancolitis secondary to C. difficile colitis causing diarrhea. Admitting physician, Dr. Lorrin Jackson discussed the patient with the on-call gastroenterologist. Per recommendation, she was started on IV Flagyl and oral vancomycin for presumed C. difficile colitis. Stool studies were ordered and this C. difficile PCR came back positive. -White blood cell count increased, likely from IV steroid. -We'll continue Flagyl and vancomycin. We'll continue supportive treatment with analgesics as  needed and IV fluid hydration. -We'll hold off on advancing diet. Continue clear liquids as tolerated.  Hypokalemia. Patient's serum potassium was 2.1 on admission. Her magnesium level was 1.7. She was given several runs of IV potassium. Her potassium improved, still requires supplementation. -We'll add potassium chloride to the maintenance IV fluids. We'll hold on oral potassium due to generalized colitis and ill feeling.  Adrenal insufficiency. The patient takes 15 mg of prednisone daily. On admission, she was given 125 mg of Solu-Medrol. She was subsequently started on Solu-Cortef IV every 8 hours. -We'll continue current management with the plan to taper quickly over the next 24 hours.  Hypotension secondary to hypovolemia from diarrhea and adrenal insufficiency. Treating with IV steroid and volume repletion with IV fluids. Her blood pressure is improving.  Chronic asthma. Currently stable. She was continued on Singulair, Spiriva and Pulmicort.   DVT prophylaxis: Subcutaneous heparin Code Status: Full code Family Communication: Family not available Disposition Plan: Discharge when clinically appropriate, likely in a few days.   Consultants:   None  Procedures:   None  Antimicrobials:   Oral vancomycin 10/30/15  IV metronidazole 10/30/15    Subjective: Patient continues to have loose/watery diarrhea numbering 3-4 this morning. She acknowledges diffuse abdominal pain mostly left lower quadrant, but the pain medicine is helping.  Objective: Filed Vitals:   10/30/15 2303 10/31/15 0539 10/31/15 0815 10/31/15 0918  BP:  117/82    Pulse:  98    Temp:  97.6 F (36.4 C)    TempSrc:  Oral    Resp:  24    Height:      Weight:  111.857 kg (246 lb  9.6 oz)    SpO2: 97% 98% 100% 97%    Intake/Output Summary (Last 24 hours) at 10/31/15 1525 Last data filed at 10/31/15 0540  Gross per 24 hour  Intake      0 ml  Output    528 ml  Net   -528 ml   Filed Weights    10/30/15 1020 10/30/15 2100 10/31/15 0539  Weight: 104.327 kg (230 lb) 111.4 kg (245 lb 9.5 oz) 111.857 kg (246 lb 9.6 oz)    Examination:  General exam: Appears calm and comfortable; obese 56 year old African-American woman who appears ill.  Respiratory system: Clear to auscultation. Respiratory effort normal. Cardiovascular system: S1 & S2 heard, RRR. No JVD, murmurs, rubs, gallops or clicks. No pedal edema. Gastrointestinal system: Abdomen is obese and moderately diffusely tender, but worse on the left lower quadrant. No organomegaly or masses felt. Normal bowel sounds heard. Central nervous system: Alert and oriented. No focal neurological deficits. Extremities: Symmetric 5 x 5 power. Skin: No rashes, lesions or ulcers Psychiatry: Judgement and insight appear normal. Mood & affect appropriate.     Data Reviewed: I have personally reviewed following labs and imaging studies  CBC:  Recent Labs Lab 10/30/15 1057 10/31/15 0547  WBC 23.7* 25.9*  NEUTROABS 21.9*  --   HGB 10.8* 12.3  HCT 32.6* 37.2  MCV 85.6 84.9  PLT 493* [id number]*   Basic Metabolic Panel:  Recent Labs Lab 10/30/15 1057 10/31/15 0547  NA 135 138  K 2.1* 3.1*  CL 100* 107  CO2 27 21*  GLUCOSE 86 109*  BUN 19 22*  CREATININE 1.36* 1.12*  CALCIUM 7.1* 6.6*  MG 1.7  --    GFR: Estimated Creatinine Clearance: 65.4 mL/min (by C-G formula based on Cr of 1.12). Liver Function Tests:  Recent Labs Lab 10/30/15 1239  AST 16  ALT 13*  ALKPHOS 88  BILITOT 1.1  PROT 4.8*  ALBUMIN 2.1*    Recent Labs Lab 10/30/15 1057  LIPASE 9*   No results for input(s): AMMONIA in the last 168 hours. Coagulation Profile: No results for input(s): INR, PROTIME in the last 168 hours. Cardiac Enzymes:  Recent Labs Lab 10/30/15 1239  TROPONINI <0.03   BNP (last 3 results) No results for input(s): PROBNP in the last 8760 hours. HbA1C: No results for input(s): HGBA1C in the last 72 hours. CBG: No results for  input(s): GLUCAP in the last 168 hours. Lipid Profile: No results for input(s): CHOL, HDL, LDLCALC, TRIG, CHOLHDL, LDLDIRECT in the last 72 hours. Thyroid Function Tests: No results for input(s): TSH, T4TOTAL, FREET4, T3FREE, THYROIDAB in the last 72 hours. Anemia Panel: No results for input(s): VITAMINB12, FOLATE, FERRITIN, TIBC, IRON, RETICCTPCT in the last 72 hours. Sepsis Labs:  Recent Labs Lab 10/30/15 1239 10/30/15 1525  LATICACIDVEN 1.8 1.5    Recent Results (from the past 240 hour(s))  Urine culture     Status: None   Collection Time: 10/30/15 12:38 PM  Result Value Ref Range Status   Specimen Description URINE, CATHETERIZED  Final   Special Requests NONE  Final   Culture NO GROWTH Performed at [facility]   Final   Report Status 10/31/2015 FINAL  Final  Culture, blood (routine x 2)     Status: None (Preliminary result)   Collection Time: 10/30/15 12:39 PM  Result Value Ref Range Status   Specimen Description BLOOD LEFT ANTECUBITAL  Final   Special Requests BOTTLES DRAWN AEROBIC AND ANAEROBIC [place]  Final  Culture NO GROWTH < 24 HOURS  Final   Report Status PENDING  Incomplete  Culture, blood (routine x 2)     Status: None (Preliminary result)   Collection Time: 10/30/15 12:53 PM  Result Value Ref Range Status   Specimen Description BLOOD RIGHT ANTECUBITAL  Final   Special Requests BOTTLES DRAWN AEROBIC AND ANAEROBIC 6CC EACH  Final   Culture NO GROWTH < 24 HOURS  Final   Report Status PENDING  Incomplete  C difficile quick scan w PCR reflex     Status: Abnormal   Collection Time: 10/31/15  2:21 AM  Result Value Ref Range Status   C Diff antigen POSITIVE (A) NEGATIVE Final   C Diff toxin POSITIVE (A) NEGATIVE Final   C Diff interpretation Toxin producing C. difficile detected.  Final    Comment: CRITICAL RESULT CALLED TO, READ BACK BY AND VERIFIED WITH: Marylouise Stacks AT [id number] ON [id number] BY FORSYTH K          Radiology Studies: Ct Abdomen Pelvis  Wo Contrast  10/30/2015  CLINICAL DATA:  Nausea with vomiting and diarrhea. Abdominal pain for 1 week. EXAM: CT ABDOMEN AND PELVIS WITHOUT CONTRAST TECHNIQUE: Multidetector CT imaging of the abdomen and pelvis was performed following the standard protocol without IV contrast. COMPARISON:  None. FINDINGS: Lower chest:  Bilateral dependent atelectasis. Hepatobiliary: No focal abnormality in the liver on this study without intravenous contrast. No evidence of hepatomegaly. Gallbladder surgically absent. No intrahepatic or extrahepatic biliary dilation. Pancreas: No focal mass lesion. No dilatation of the main duct. No intraparenchymal cyst. No peripancreatic edema. Spleen: No splenomegaly. No focal mass lesion. Adrenals/Urinary Tract: No adrenal nodule or mass. No hydronephrosis in either kidney. Uninfused imaging shows no focal abnormality of either kidney. No hydroureter. Foley catheter decompresses the urinary bladder. Stomach/Bowel: Patient is status post gastric bypass surgery. Duodenum is normally positioned as is the ligament of Treitz. No small bowel wall thickening. No small bowel dilatation. Terminal ileum unremarkable. Appendix not well seen. Marked circumferential wall thickening and edema is seen in the colon diffusely, from the cecal tip to the level of the rectum. This is associated with diffuse pericolonic edema/ inflammation. Vascular/Lymphatic: There is abdominal aortic atherosclerosis without aneurysm. There is no gastrohepatic or hepatoduodenal ligament lymphadenopathy. No intraperitoneal or retroperitoneal lymphadenopathy. No pelvic sidewall lymphadenopathy. Reproductive: The uterus is surgically absent. There is no adnexal mass. Other: No substantial intraperitoneal free fluid. Musculoskeletal: Patient is status post lower lumbar fusion. Bone windows reveal no worrisome lytic or sclerotic osseous lesions. IMPRESSION: Diffuse edematous wall thickening in the colon, from the cecal tip to the rectum  associated with diffuse pericolonic edema/ inflammation. Imaging features are compatible with infectious/inflammatory pancolitis. Electronically Signed   By: Misty Stanley M.D.   On: 10/30/2015 17:03   Dg Chest Port 1 View  10/30/2015  CLINICAL DATA:  Abdominal pain with nausea vomiting and diarrhea. EXAM: PORTABLE CHEST 1 VIEW COMPARISON:  07/06/2015. FINDINGS: 1249 hours. Lung volumes are low. The lungs are clear wiithout focal pneumonia, edema, pneumothorax or pleural effusion. Cardiopericardial silhouette is at upper limits of normal for size. The visualized bony structures of the thorax are intact. Telemetry leads overlie the chest. IMPRESSION: Low volume film without acute cardiopulmonary findings. Electronically Signed   By: Misty Stanley M.D.   On: 10/30/2015 13:00        Scheduled Meds: . sodium chloride   Intravenous STAT  . budesonide  0.5 mg Nebulization BID  . citalopram  20 mg Oral  QHS  . DULoxetine  30 mg Oral Daily  . gabapentin  600 mg Oral TID  . heparin subcutaneous  5,000 Units Subcutaneous Q8H  . hydrocortisone sod succinate (SOLU-CORTEF) inj  25 mg Intravenous Q8H  . latanoprost  1 drop Both Eyes QHS  . loratadine  10 mg Oral Daily  . metronidazole  500 mg Intravenous Q8H  . montelukast  10 mg Oral QHS  . tiotropium  1 capsule Inhalation Daily  . vancomycin  250 mg Oral Q6H   Continuous Infusions: . 0.9 % NaCl with KCl 40 mEq / L 125 mL/hr (10/31/15 1411)     LOS: 1 day    Time spent: 35-40 minutes    Rexene Alberts, MD [facility] Pager [phone]  If 7PM-7AM, please contact night-coverage [email or web address] Password [email or web address] 10/31/2015, 3:25 PM

## 2015-11-01 DIAGNOSIS — E872 Acidosis, unspecified: Secondary | ICD-10-CM | POA: Diagnosis not present

## 2015-11-01 LAB — BASIC METABOLIC PANEL
ANION GAP: 11 (ref 5–15)
BUN: 30 mg/dL — ABNORMAL HIGH (ref 6–20)
CALCIUM: 6.3 mg/dL — AB (ref 8.9–10.3)
CO2: 17 mmol/L — AB (ref 22–32)
CREATININE: 1.81 mg/dL — AB (ref 0.44–1.00)
Chloride: 110 mmol/L (ref 101–111)
GFR, EST AFRICAN AMERICAN: 35 mL/min — AB (ref >60)
GFR, EST NON AFRICAN AMERICAN: 30 mL/min — AB (ref >60)
Glucose, Bld: 110 mg/dL — ABNORMAL HIGH (ref 65–99)
Potassium: 4.4 mmol/L (ref 3.5–5.1)
SODIUM: 138 mmol/L (ref 135–145)

## 2015-11-01 LAB — GASTROINTESTINAL PANEL BY PCR, STOOL (REPLACES STOOL CULTURE)
ADENOVIRUS F40/41: NOT DETECTED
Astrovirus: NOT DETECTED
CAMPYLOBACTER SPECIES: NOT DETECTED
Cryptosporidium: NOT DETECTED
Cyclospora cayetanensis: NOT DETECTED
E. coli O157: NOT DETECTED
ENTEROAGGREGATIVE E COLI (EAEC): NOT DETECTED
ENTEROPATHOGENIC E COLI (EPEC): NOT DETECTED
Entamoeba histolytica: NOT DETECTED
Enterotoxigenic E coli (ETEC): NOT DETECTED
GIARDIA LAMBLIA: NOT DETECTED
NOROVIRUS GI/GII: NOT DETECTED
PLESIMONAS SHIGELLOIDES: NOT DETECTED
Rotavirus A: NOT DETECTED
Salmonella species: NOT DETECTED
Sapovirus (I, II, IV, and V): NOT DETECTED
Shiga like toxin producing E coli (STEC): NOT DETECTED
Shigella/Enteroinvasive E coli (EIEC): NOT DETECTED
Vibrio cholerae: NOT DETECTED
Vibrio species: NOT DETECTED
YERSINIA ENTEROCOLITICA: NOT DETECTED

## 2015-11-01 LAB — CBC
HEMATOCRIT: 37.9 % (ref 36.0–46.0)
Hemoglobin: 12.3 g/dL (ref 12.0–15.0)
MCH: 27.9 pg (ref 26.0–34.0)
MCHC: 32.5 g/dL (ref 30.0–36.0)
MCV: 85.9 fL (ref 78.0–100.0)
Platelets: 420 10*3/uL — ABNORMAL HIGH (ref 150–400)
RBC: 4.41 MIL/uL (ref 3.87–5.11)
RDW: 16.1 % — AB (ref 11.5–15.5)
WBC: 22.7 10*3/uL — AB (ref 4.0–10.5)

## 2015-11-01 LAB — COMPREHENSIVE METABOLIC PANEL
ALBUMIN: 1.9 g/dL — AB (ref 3.5–5.0)
ALK PHOS: 79 U/L (ref 38–126)
ALT: 14 U/L (ref 14–54)
AST: 26 U/L (ref 15–41)
Anion gap: 7 (ref 5–15)
BUN: 34 mg/dL — AB (ref 6–20)
CALCIUM: 6 mg/dL — AB (ref 8.9–10.3)
CHLORIDE: 108 mmol/L (ref 101–111)
CO2: 15 mmol/L — AB (ref 22–32)
CREATININE: 2.65 mg/dL — AB (ref 0.44–1.00)
GFR calc non Af Amer: 19 mL/min — ABNORMAL LOW (ref >60)
GFR, EST AFRICAN AMERICAN: 22 mL/min — AB (ref >60)
GLUCOSE: 124 mg/dL — AB (ref 65–99)
Potassium: 4.3 mmol/L (ref 3.5–5.1)
SODIUM: 130 mmol/L — AB (ref 135–145)
Total Bilirubin: 0.4 mg/dL (ref 0.3–1.2)
Total Protein: 4.6 g/dL — ABNORMAL LOW (ref 6.5–8.1)

## 2015-11-01 MED ORDER — SODIUM BICARBONATE 8.4 % IV SOLN
INTRAVENOUS | Status: DC
  Administered 2015-11-01 – 2015-11-02 (×3): via INTRAVENOUS
  Filled 2015-11-01 (×7): qty 1000

## 2015-11-01 MED ORDER — HYDROCORTISONE NA SUCCINATE PF 100 MG IJ SOLR
25.0000 mg | Freq: Two times a day (BID) | INTRAMUSCULAR | Status: DC
  Administered 2015-11-01 – 2015-11-02 (×2): 25 mg via INTRAVENOUS
  Filled 2015-11-01 (×2): qty 2

## 2015-11-01 MED ORDER — CALCIUM CARBONATE 1250 (500 CA) MG PO TABS
1000.0000 mg | ORAL_TABLET | Freq: Once | ORAL | Status: AC
  Administered 2015-11-01: 1000 mg via ORAL
  Filled 2015-11-01: qty 1

## 2015-11-01 MED ORDER — SODIUM BICARBONATE 8.4 % IV SOLN
INTRAVENOUS | Status: DC
  Filled 2015-11-01 (×7): qty 1000

## 2015-11-01 NOTE — Progress Notes (Signed)
PROGRESS NOTE    Deborah Murray  [id number] DOB: 12-08-1960 DOA: 10/30/2015 PCP: Sol Passer, MD    Brief Narrative:  Patient is a 55 y.o. female with history of atrial fibrillation, HTN severe asthma/allergies, adrenal insufficiency, and recent back surgery, who presented to the ED on 10/30/2015 with a chief complaint of abdominal pain and diarrhea. She was discharged from the hospital from back surgery approximately 2 weeks ago. When she got home, she started having loose stools. She does not recall being treated with an antibiotic during the hospitalization for back surgery. She reported fevers at home, but no nausea or vomiting. She had felt very weak.  In the ED, she was febrile with a temperature of 102.3. Her blood pressure was on the low side of normal. Her white blood cell count was 23.7. CT of her abdomen and pelvis revealed diffuse edematous wall thickening in the colon from the cecal tip to the rectum associated with diffuse pericolonic edema/inflammation; compatible with infectious/inflammatory pancolitis. She was admitted for further evaluation and management.  Assessment & Plan:   Principal Problem:   Pancolitis ([place]) Active Problems:   C. difficile diarrhea   C. difficile colitis   Metabolic acidosis   Hypokalemia   Essential hypertension   Severe persistent asthma   Morbid obesity (HCC)   Hypotension   History of atrial fibrillation- NSR now   History of adrenal insufficiency   History of hypertension   Hypocalcemia   1. Pancolitis secondary to C. difficile colitis causing diarrhea. Admitting physician, Dr. Lorrin Jackson discussed the patient with the on-call gastroenterologist. Per recommendation, she was started on IV Flagyl and oral vancomycin for presumed C. difficile colitis. Stool studies were ordered and the C. difficile PCR came back positive. GI pathogen panel is still pending. -White blood cell count has trended down some, but increase is partly from IV  steroids. -We'll continue Flagyl and vancomycin. We'll continue supportive treatment with analgesics as needed and IV fluid hydration. -We'll advance her diet to full liquids as tolerated.  Hypokalemia. Patient's serum potassium was 2.1 on admission. Her magnesium level was 1.7. She was given several runs of IV potassium. She was subsequently given more potassium chloride in the maintenance IV fluids. Her serum potassium has improved. We'll continue to monitor.  Metabolic acidosis. The patient's CO2 has decreased from 21 to 17, likely from diarrhea. She apparently lost IV access early morning of 11/01/15.-PICC line placed. Will change IV fluids to add bicarbonate.  Adrenal insufficiency. The patient takes 15 mg of prednisone daily. On admission, she was given 125 mg of Solu-Medrol. She was subsequently started on Solu-Cortef IV every 8 hours. -We'll continue current management with the plan to taper quickly over the next 24 hours.  Acute kidney injury. The patient's creatinine increased from 1.1-1.8; query the etiology other than the patient not receiving IV fluids due to lack of IV access late 10/31/15-early 11/01/15. Foley catheter seems to be draining dark-colored urine. -We'll continue to monitor. -Will order urinalysis.  Hypocalcemia. The patient's serum was 7.1 on admission. It has decreased to 6.3. -We'll order ionized calcium, phosphorus, and albumin for assessment.  Hypotension secondary to hypovolemia from diarrhea and adrenal insufficiency. Treating with IV steroid and volume repletion with IV fluids. Her blood pressure is improving.  Chronic asthma. Currently stable. She was continued on Singulair, Spiriva and Pulmicort.   DVT prophylaxis: Subcutaneous heparin Code Status: Full code Family Communication: Family not available Disposition Plan: Discharge when clinically appropriate, likely in a few  days.   Consultants:   None  Procedures:   None  Antimicrobials:    Oral vancomycin 10/30/15  IV metronidazole 10/30/15    Subjective: Patient continues to have watery stools, but the volume has decreased. She believes she is feeling somewhat better, but still has a lot of abdominal "soreness". She denies pain with urination but has a Foley catheter in place. It appears to be draining dark-colored urine.  Objective: Filed Vitals:   10/31/15 1936 10/31/15 1954 11/01/15 0500 11/01/15 0801  BP: 112/59  118/62   Pulse: 104 84 100   Temp: 97.8 F (36.6 C)  98 F (36.7 C)   TempSrc: Oral  Oral   Resp: 22 18 21    Height:      Weight:   114.805 kg (253 lb 1.6 oz)   SpO2: 98%  98% 94%    Intake/Output Summary (Last 24 hours) at 11/01/15 0940 Last data filed at 11/01/15 0931  Gross per 24 hour  Intake   1340 ml  Output    506 ml  Net    834 ml   Filed Weights   10/30/15 2100 10/31/15 0539 11/01/15 0500  Weight: 111.4 kg (245 lb 9.5 oz) 111.857 kg (246 lb 9.6 oz) 114.805 kg (253 lb 1.6 oz)    Examination:  General exam: Appears calm and comfortable; obese 55 year old African-American woman who looks better generally.  Respiratory system: Clear to auscultation. Respiratory effort normal. Cardiovascular system: S1 & S2 heard, RRR. No JVD, murmurs, rubs, gallops or clicks. No pedal edema. Gastrointestinal system: Abdomen is obese and moderately diffusely tender, but worse on the left lower quadrant. No organomegaly or masses felt. Normal bowel sounds heard. GU: Indwelling Foley catheter draining tea-colored urine. Central nervous system: Alert and oriented. No focal neurological deficits. Extremities: Symmetric 5 x 5 power. Skin: No rashes, lesions or ulcers Psychiatry: Judgement and insight appear normal. Mood & affect appropriate.     Data Reviewed: I have personally reviewed following labs and imaging studies  CBC:  Recent Labs Lab 10/30/15 1057 10/31/15 0547 11/01/15 0500  WBC 23.7* 25.9* 22.7*  NEUTROABS 21.9*  --   --   HGB 10.8*  12.3 12.3  HCT 32.6* 37.2 37.9  MCV 85.6 84.9 85.9  PLT 493* 429* 0000000*   Basic Metabolic Panel:  Recent Labs Lab 10/30/15 1057 10/31/15 0547 11/01/15 0500  NA 135 138 138  K 2.1* 3.1* 4.4  CL 100* 107 110  CO2 27 21* 17*  GLUCOSE 86 109* 110*  BUN 19 22* 30*  CREATININE 1.36* 1.12* 1.81*  CALCIUM 7.1* 6.6* 6.3*  MG 1.7  --   --    GFR: Estimated Creatinine Clearance: 41.1 mL/min (by C-G formula based on Cr of 1.81). Liver Function Tests:  Recent Labs Lab 10/30/15 1239  AST 16  ALT 13*  ALKPHOS 88  BILITOT 1.1  PROT 4.8*  ALBUMIN 2.1*    Recent Labs Lab 10/30/15 1057  LIPASE 9*   No results for input(s): AMMONIA in the last 168 hours. Coagulation Profile: No results for input(s): INR, PROTIME in the last 168 hours. Cardiac Enzymes:  Recent Labs Lab 10/30/15 1239  TROPONINI <0.03   BNP (last 3 results) No results for input(s): PROBNP in the last 8760 hours. HbA1C: No results for input(s): HGBA1C in the last 72 hours. CBG: No results for input(s): GLUCAP in the last 168 hours. Lipid Profile: No results for input(s): CHOL, HDL, LDLCALC, TRIG, CHOLHDL, LDLDIRECT in the last 72 hours.  Thyroid Function Tests: No results for input(s): TSH, T4TOTAL, FREET4, T3FREE, THYROIDAB in the last 72 hours. Anemia Panel: No results for input(s): VITAMINB12, FOLATE, FERRITIN, TIBC, IRON, RETICCTPCT in the last 72 hours. Sepsis Labs:  Recent Labs Lab 10/30/15 1239 10/30/15 1525  LATICACIDVEN 1.8 1.5    Recent Results (from the past 240 hour(s))  Urine culture     Status: None   Collection Time: 10/30/15 12:38 PM  Result Value Ref Range Status   Specimen Description URINE, CATHETERIZED  Final   Special Requests NONE  Final   Culture NO GROWTH Performed at [facility]   Final   Report Status 10/31/2015 FINAL  Final  Culture, blood (routine x 2)     Status: None (Preliminary result)   Collection Time: 10/30/15 12:39 PM  Result Value Ref Range Status    Specimen Description BLOOD LEFT ANTECUBITAL  Final   Special Requests BOTTLES DRAWN AEROBIC AND ANAEROBIC 10CC EACH  Final   Culture NO GROWTH < 24 HOURS  Final   Report Status PENDING  Incomplete  Culture, blood (routine x 2)     Status: None (Preliminary result)   Collection Time: 10/30/15 12:53 PM  Result Value Ref Range Status   Specimen Description BLOOD RIGHT ANTECUBITAL  Final   Special Requests BOTTLES DRAWN AEROBIC AND ANAEROBIC 6CC EACH  Final   Culture NO GROWTH < 24 HOURS  Final   Report Status PENDING  Incomplete  C difficile quick scan w PCR reflex     Status: Abnormal   Collection Time: 10/31/15  2:21 AM  Result Value Ref Range Status   C Diff antigen POSITIVE (A) NEGATIVE Final   C Diff toxin POSITIVE (A) NEGATIVE Final   C Diff interpretation Toxin producing C. difficile detected.  Final    Comment: CRITICAL RESULT CALLED TO, READ BACK BY AND VERIFIED WITH: Marylouise Stacks AT [id number] ON [id number] BY FORSYTH K          Radiology Studies: Ct Abdomen Pelvis Wo Contrast  10/30/2015  CLINICAL DATA:  Nausea with vomiting and diarrhea. Abdominal pain for 1 week. EXAM: CT ABDOMEN AND PELVIS WITHOUT CONTRAST TECHNIQUE: Multidetector CT imaging of the abdomen and pelvis was performed following the standard protocol without IV contrast. COMPARISON:  None. FINDINGS: Lower chest:  Bilateral dependent atelectasis. Hepatobiliary: No focal abnormality in the liver on this study without intravenous contrast. No evidence of hepatomegaly. Gallbladder surgically absent. No intrahepatic or extrahepatic biliary dilation. Pancreas: No focal mass lesion. No dilatation of the main duct. No intraparenchymal cyst. No peripancreatic edema. Spleen: No splenomegaly. No focal mass lesion. Adrenals/Urinary Tract: No adrenal nodule or mass. No hydronephrosis in either kidney. Uninfused imaging shows no focal abnormality of either kidney. No hydroureter. Foley catheter decompresses the urinary bladder.  Stomach/Bowel: Patient is status post gastric bypass surgery. Duodenum is normally positioned as is the ligament of Treitz. No small bowel wall thickening. No small bowel dilatation. Terminal ileum unremarkable. Appendix not well seen. Marked circumferential wall thickening and edema is seen in the colon diffusely, from the cecal tip to the level of the rectum. This is associated with diffuse pericolonic edema/ inflammation. Vascular/Lymphatic: There is abdominal aortic atherosclerosis without aneurysm. There is no gastrohepatic or hepatoduodenal ligament lymphadenopathy. No intraperitoneal or retroperitoneal lymphadenopathy. No pelvic sidewall lymphadenopathy. Reproductive: The uterus is surgically absent. There is no adnexal mass. Other: No substantial intraperitoneal free fluid. Musculoskeletal: Patient is status post lower lumbar fusion. Bone windows reveal no worrisome lytic or sclerotic osseous  lesions. IMPRESSION: Diffuse edematous wall thickening in the colon, from the cecal tip to the rectum associated with diffuse pericolonic edema/ inflammation. Imaging features are compatible with infectious/inflammatory pancolitis. Electronically Signed   By: Misty Stanley M.D.   On: 10/30/2015 17:03   Dg Chest Port 1 View  10/30/2015  CLINICAL DATA:  Abdominal pain with nausea vomiting and diarrhea. EXAM: PORTABLE CHEST 1 VIEW COMPARISON:  07/06/2015. FINDINGS: 1249 hours. Lung volumes are low. The lungs are clear wiithout focal pneumonia, edema, pneumothorax or pleural effusion. Cardiopericardial silhouette is at upper limits of normal for size. The visualized bony structures of the thorax are intact. Telemetry leads overlie the chest. IMPRESSION: Low volume film without acute cardiopulmonary findings. Electronically Signed   By: Misty Stanley M.D.   On: 10/30/2015 13:00        Scheduled Meds: . budesonide  0.5 mg Nebulization BID  . citalopram  20 mg Oral QHS  . DULoxetine  30 mg Oral Daily  .  gabapentin  600 mg Oral TID  . heparin subcutaneous  5,000 Units Subcutaneous Q8H  . hydrocortisone sod succinate (SOLU-CORTEF) inj  25 mg Intravenous Q12H  . latanoprost  1 drop Both Eyes QHS  . loratadine  10 mg Oral Daily  . metronidazole  500 mg Intravenous Q8H  . montelukast  10 mg Oral QHS  . tiotropium  1 capsule Inhalation Daily  . vancomycin  250 mg Oral Q6H   Continuous Infusions: . 0.9 % NaCl with KCl 40 mEq / L 125 mL/hr (11/01/15 0002)     LOS: 2 days    Time spent: 62 minutes    Rexene Alberts, MD [facility] Pager [phone]  If 7PM-7AM, please contact night-coverage [email or web address] Password [email or web address] 11/01/2015, 9:40 AM

## 2015-11-01 NOTE — Progress Notes (Signed)
Patient slept through neb, does not arouse easily. Sleeping sound

## 2015-11-01 NOTE — Care Management Note (Signed)
Case Management Note  Patient Details  Name: Deborah Murray MRN: [id number] Date of Birth: December 11, 1960  Subjective/Objective:   Patient admitted from home with Pancolitis. She is active with [facility] for home health. Resumption order placed.                 Action/Plan: DC home with home health services. Orders placed.   Expected Discharge Date:        11/01/2015          Expected Discharge Plan:  [place]  In-House Referral:  Clinical Social Work  Discharge planning Services  CM Consult  Post Acute Care Choice:  Resumption of Svcs/PTA Provider, Home Health Choice offered to:     DME Arranged:    DME Agency:     HH Arranged:    [place]:  [place]  Status of Service:  In process, will continue to follow  If discussed at Long Length of Stay Meetings, dates discussed:    Additional Comments:  Mekaila Tarnow, Chauncey Reading, RN 11/01/2015, 4:05 PM

## 2015-11-02 ENCOUNTER — Inpatient Hospital Stay (HOSPITAL_COMMUNITY): Payer: Medicare Other

## 2015-11-02 DIAGNOSIS — N179 Acute kidney failure, unspecified: Secondary | ICD-10-CM | POA: Diagnosis not present

## 2015-11-02 DIAGNOSIS — E871 Hypo-osmolality and hyponatremia: Secondary | ICD-10-CM | POA: Diagnosis present

## 2015-11-02 LAB — CBC
HCT: 32 % — ABNORMAL LOW (ref 36.0–46.0)
Hemoglobin: 10.6 g/dL — ABNORMAL LOW (ref 12.0–15.0)
MCH: 28.5 pg (ref 26.0–34.0)
MCHC: 33.1 g/dL (ref 30.0–36.0)
MCV: 86 fL (ref 78.0–100.0)
PLATELETS: 267 10*3/uL (ref 150–400)
RBC: 3.72 MIL/uL — AB (ref 3.87–5.11)
RDW: 16.2 % — ABNORMAL HIGH (ref 11.5–15.5)
WBC: 28.6 10*3/uL — ABNORMAL HIGH (ref 4.0–10.5)

## 2015-11-02 LAB — BASIC METABOLIC PANEL
ANION GAP: 4 — AB (ref 5–15)
ANION GAP: 5 (ref 5–15)
BUN: 27 mg/dL — ABNORMAL HIGH (ref 6–20)
BUN: 36 mg/dL — AB (ref 6–20)
CHLORIDE: 119 mmol/L — AB (ref 101–111)
CHLORIDE: 105 mmol/L (ref 101–111)
CO2: 12 mmol/L — AB (ref 22–32)
CO2: 20 mmol/L — ABNORMAL LOW (ref 22–32)
Calcium: 4.1 mg/dL — CL (ref 8.9–10.3)
Calcium: 6.5 mg/dL — ABNORMAL LOW (ref 8.9–10.3)
Creatinine, Ser: 1.81 mg/dL — ABNORMAL HIGH (ref 0.44–1.00)
Creatinine, Ser: 2.46 mg/dL — ABNORMAL HIGH (ref 0.44–1.00)
GFR calc non Af Amer: 30 mL/min — ABNORMAL LOW (ref >60)
GFR calc non Af Amer: 21 mL/min — ABNORMAL LOW (ref >60)
GFR, EST AFRICAN AMERICAN: 35 mL/min — AB (ref >60)
GFR, EST AFRICAN AMERICAN: 24 mL/min — AB (ref >60)
Glucose, Bld: 107 mg/dL — ABNORMAL HIGH (ref 65–99)
Glucose, Bld: 117 mg/dL — ABNORMAL HIGH (ref 65–99)
POTASSIUM: 2.5 mmol/L — AB (ref 3.5–5.1)
POTASSIUM: 4.5 mmol/L (ref 3.5–5.1)
SODIUM: 130 mmol/L — AB (ref 135–145)
Sodium: 135 mmol/L (ref 135–145)

## 2015-11-02 LAB — BLOOD GAS, ARTERIAL
ACID-BASE DEFICIT: 5.8 mmol/L — AB (ref 0.0–2.0)
Bicarbonate: 20.6 mEq/L (ref 20.0–24.0)
Drawn by: 277331
FIO2: 0.21
O2 Saturation: 96.4 %
PCO2 ART: 23.9 mmHg — AB (ref 35.0–45.0)
PO2 ART: 81.8 mmHg (ref 80.0–100.0)
Patient temperature: 37
TCO2: 23.9 mmol/L (ref 0–100)
pH, Arterial: 7.471 — ABNORMAL HIGH (ref 7.350–7.450)

## 2015-11-02 LAB — URINALYSIS, ROUTINE W REFLEX MICROSCOPIC
GLUCOSE, UA: 100 mg/dL — AB
Ketones, ur: 15 mg/dL — AB
Nitrite: POSITIVE — AB
PH: 5 (ref 5.0–8.0)
PROTEIN: 100 mg/dL — AB
Specific Gravity, Urine: 1.025 (ref 1.005–1.030)

## 2015-11-02 LAB — OSMOLALITY: Osmolality: 285 mOsm/kg (ref 275–295)

## 2015-11-02 LAB — URINE MICROSCOPIC-ADD ON

## 2015-11-02 LAB — LACTIC ACID, PLASMA
LACTIC ACID, VENOUS: 2.3 mmol/L — AB (ref 0.5–1.9)
LACTIC ACID, VENOUS: 2.2 mmol/L — AB (ref 0.5–1.9)

## 2015-11-02 LAB — NA AND K (SODIUM & POTASSIUM), RAND UR: Potassium Urine: 42 mmol/L

## 2015-11-02 LAB — MAGNESIUM: MAGNESIUM: 1.1 mg/dL — AB (ref 1.7–2.4)

## 2015-11-02 LAB — CREATININE, URINE, RANDOM: Creatinine, Urine: 225.34 mg/dL

## 2015-11-02 LAB — OSMOLALITY, URINE: Osmolality, Ur: 390 mOsm/kg (ref 300–900)

## 2015-11-02 LAB — PHOSPHORUS: Phosphorus: 1.5 mg/dL — ABNORMAL LOW (ref 2.5–4.6)

## 2015-11-02 LAB — CHLORIDE, URINE, RANDOM: Chloride Urine: 45 mmol/L

## 2015-11-02 MED ORDER — SODIUM BICARBONATE 8.4 % IV SOLN
INTRAVENOUS | Status: DC
  Filled 2015-11-02 (×6): qty 1000

## 2015-11-02 MED ORDER — POTASSIUM CHLORIDE 10 MEQ/100ML IV SOLN
10.0000 meq | INTRAVENOUS | Status: AC
  Administered 2015-11-02 (×5): 10 meq via INTRAVENOUS
  Filled 2015-11-02: qty 100

## 2015-11-02 MED ORDER — VANCOMYCIN HCL 500 MG IV SOLR
500.0000 mg | Freq: Four times a day (QID) | INTRAVENOUS | Status: DC
  Administered 2015-11-03: 500 mg via RECTAL
  Filled 2015-11-02 (×7): qty 500

## 2015-11-02 MED ORDER — MAGNESIUM SULFATE 50 % IJ SOLN: 2.0000 g | Freq: Once | INTRAVENOUS | Status: DC

## 2015-11-02 MED ORDER — HYDROCORTISONE NA SUCCINATE PF 100 MG IJ SOLR
25.0000 mg | Freq: Two times a day (BID) | INTRAMUSCULAR | Status: DC
  Administered 2015-11-02 – 2015-11-03 (×2): 25 mg via INTRAVENOUS
  Administered 2015-11-03: 125 mg via INTRAVENOUS
  Filled 2015-11-02 (×3): qty 2

## 2015-11-02 MED ORDER — VANCOMYCIN 50 MG/ML ORAL SOLUTION
500.0000 mg | Freq: Four times a day (QID) | ORAL | Status: AC
  Administered 2015-11-03 – 2015-11-14 (×46): 500 mg via ORAL
  Filled 2015-11-02 (×51): qty 10

## 2015-11-02 MED ORDER — FUROSEMIDE 10 MG/ML IJ SOLN
20.0000 mg | Freq: Once | INTRAMUSCULAR | Status: AC
Start: 2015-11-02 — End: 2015-11-02
  Administered 2015-11-02: 20 mg via INTRAVENOUS
  Filled 2015-11-02: qty 2

## 2015-11-02 MED ORDER — SODIUM CHLORIDE 0.9 % IV BOLUS (SEPSIS)
1000.0000 mL | Freq: Once | INTRAVENOUS | Status: AC
  Administered 2015-11-02: 1000 mL via INTRAVENOUS

## 2015-11-02 MED ORDER — DIATRIZOATE MEGLUMINE & SODIUM 66-10 % PO SOLN
ORAL | Status: AC
  Filled 2015-11-02: qty 30

## 2015-11-02 MED ORDER — HYDROCORTISONE NA SUCCINATE PF 100 MG IJ SOLR: 25.0000 mg | INTRAMUSCULAR | Status: DC

## 2015-11-02 MED ORDER — VANCOMYCIN HCL 500 MG IV SOLR
INTRAVENOUS | Status: AC
  Filled 2015-11-02: qty 1000

## 2015-11-02 MED ORDER — MAGNESIUM SULFATE 2 GM/50ML IV SOLN
2.0000 g | Freq: Once | INTRAVENOUS | Status: AC
  Administered 2015-11-02: 2 g via INTRAVENOUS
  Filled 2015-11-02: qty 50

## 2015-11-02 MED ORDER — POTASSIUM CHLORIDE 2 MEQ/ML IV SOLN
INTRAVENOUS | Status: DC
  Administered 2015-11-02: 15:00:00 via INTRAVENOUS
  Filled 2015-11-02 (×6): qty 1000

## 2015-11-02 MED ORDER — K PHOS MONO-SOD PHOS DI & MONO 155-852-130 MG PO TABS
500.0000 mg | ORAL_TABLET | Freq: Two times a day (BID) | ORAL | Status: DC
  Administered 2015-11-02 – 2015-11-04 (×5): 500 mg via ORAL
  Filled 2015-11-02 (×9): qty 2

## 2015-11-02 MED ORDER — ALBUMIN HUMAN 25 % IV SOLN
25.0000 g | Freq: Two times a day (BID) | INTRAVENOUS | Status: DC
  Administered 2015-11-02 – 2015-11-04 (×6): 25 g via INTRAVENOUS
  Filled 2015-11-02 (×7): qty 100

## 2015-11-02 MED ORDER — POTASSIUM CHLORIDE 10 MEQ/50ML IV SOLN
10.0000 meq | INTRAVENOUS | Status: DC
  Filled 2015-11-02 (×5): qty 50

## 2015-11-02 MED ORDER — FUROSEMIDE 10 MG/ML IJ SOLN: 80.0000 mg | Freq: Two times a day (BID) | INTRAMUSCULAR | Status: DC

## 2015-11-02 MED ORDER — CIPROFLOXACIN IN D5W 400 MG/200ML IV SOLN
400.0000 mg | INTRAVENOUS | Status: DC
  Administered 2015-11-02: 400 mg via INTRAVENOUS
  Filled 2015-11-02: qty 200

## 2015-11-02 NOTE — Progress Notes (Addendum)
During bedside rounding/report hand-off,  MD was notified that pt's BP was soft, her potassium was low and her lactic acid was elevated. MD went and assessed pt and determined pt would need to have VS Q4 with electrolyte replacement. Pt continues to be alert and oriented x 4 with family at bedside. EMR updated per MD orders.

## 2015-11-02 NOTE — Progress Notes (Signed)
Called by Radiologist re worsening of CT of the abdomen. More dilated colon, concerning for developing megacolon. I examined her abdomen.  No rebound, but diffusely tender. Lactic acid was 2.6. Hypercholoremic metabolic acidosis noted. Spoke with Dr Arnoldo Morale, recommended continue with medical Tx. He will see her in the morning. I will repeat Bmet and lactic acid. Will increase Vancomycin to 500mg  Q6, and start vancomycin retention enema. Continue with IV Flagyl. Give IVF and hold Lasix today.  Thanks,  Orvan Falconer MD FACP. Hospitalist.

## 2015-11-02 NOTE — Progress Notes (Signed)
RN noticed urine in F/C  bag was tea-colored and a scant amount. After visualizing catheter noticed it was not secured with a securement device and that a lot of the catheter seemed to be outside. Patient on IV fluids at 15ml/hr and taking PO.  0015 D/C'd F/C. Placed a new F/C but didn't get any urine output after 20 minutes. Bladder scanned patient and saw 122ml in bladder. 0045 removed f/c and inserted another F/C to ensure proper placement. Again very little output only about 68ml's after inserting almost the entire catheter tube into bladder. F/C secured to leg with securement device and placed below the bladder. Will continue to monitor output. Patient tolerated both insertions and denies any pain or feeling of needed to urinate.

## 2015-11-02 NOTE — Progress Notes (Addendum)
PROGRESS NOTE    Deborah Murray  [id number] DOB: Apr 02, 1961 DOA: 10/30/2015 PCP: Sol Passer, MD    Brief Narrative:  Patient is a 55 y.o. female with history of atrial fibrillation, HTN severe asthma/allergies, adrenal insufficiency, and recent back surgery, who presented to the ED on 10/30/2015 with a chief complaint of abdominal pain and diarrhea. She was discharged from the hospital from back surgery approximately 2 weeks ago. When she got home, she started having loose stools. She does not recall being treated with an antibiotic during the hospitalization for back surgery. She reported fevers at home, but no nausea or vomiting. She had felt very weak.  In the ED, she was febrile with a temperature of 102.3. Her blood pressure was on the low side of normal. Her white blood cell count was 23.7. CT of her abdomen and pelvis revealed diffuse edematous wall thickening in the colon from the cecal tip to the rectum associated with diffuse pericolonic edema/inflammation; compatible with infectious/inflammatory pancolitis. She was admitted for further evaluation and management.  Assessment & Plan:   Principal Problem:   Pancolitis ([place]) Active Problems:   C. difficile diarrhea   C. difficile colitis   Acute kidney injury ([place])   Metabolic acidosis   Hypokalemia   Essential hypertension   Severe persistent asthma   Morbid obesity ([facility])   Hypotension   History of atrial fibrillation- NSR now   History of adrenal insufficiency   History of hypertension   Hypocalcemia   Hyponatremia   1. Pancolitis secondary to C. difficile colitis causing diarrhea. Admitting physician, Dr. Lorrin Jackson discussed the patient with the on-call gastroenterologist. Per recommendation, she was started on IV Flagyl and oral vancomycin for presumed C. difficile colitis. Stool studies were ordered and the C. difficile PCR came back positive. GI pathogen panel is still pending. -White blood cell count did trend  down some, but it has increased again. Some contribution from IV Solu-Cortef. She remains afebrile. Lactic acid was ordered and it was modestly elevated at 2.3. -She is still having loose stools with intermittent small to large volume; she still has some diffuse abdominal is comfort. -She is eating a little of the full liquids diet. -We'll continue Flagyl and vancomycin. We'll continue supportive treatment with analgesics as needed and IV fluid hydration. -We will reimage her with a CT scan of her abdomen and pelvis with oral contrast if she can tolerate it; if findings are worse, will consult GI and/or surgery.  Acute kidney injury. The patient's creatinine increased from 1.1-1.8; query the etiology other than the patient not receiving IV fluids due to lack of IV access late 10/31/15-early 11/01/15. Foley catheter started draining very dark Coca[employer]a colored urine. -Creatinine increased to 2.65 on 11/01/15. Nursing reports a decrease in urine output despite 2 L bolus given on the night of 11/01/15. -We will continue vigorous IV fluids. Due to some peripheral edema, Lasix 20 g IV 1 was ordered. Will order renal ultrasound. Will consult nephrology. -Etiology possibly ATN from the infections.  Hypokalemia. Patient's serum potassium was 2.1 on admission. Her magnesium level was 1.7. She was given several runs of IV potassium. She was subsequently given more potassium chloride in the maintenance IV fluids. Her serum potassium has improved; BMET pending for today for reassessment>>> it revealed potassium of 2.5. Will order potassium IV runs. Hypomagnesemia. Magnesium came back at 1.1. Will treat with magnesium sulfate. Hypocalcemia. The patient's serum was 7.1 on admission. It has decreased to 6.3. She was started  on calcium supplementation. -Her albumin is only 1.9 which is continued into the hypocalcemia, but with correction, it is still below 8. -Her phosphorus is low at 1.5. -Nephrology was  consulted. Briefly discussed with Dr. Lowanda Foster who will start phosphorus supplementation and adjust IV fluids. He will also order albumin infusions.  Metabolic acidosis. The patient's CO2 has decreased from 21 to 17, likely from diarrhea. She apparently lost IV access early morning of 11/01/15.-PICC line placed. IV fluids changed to add to answer bicarbonate per liter on 11/01/15. -Follow-up Bmet was pending, but later revealed a PCO2 of 12. Her lactic acid level was only 2.3.   Adrenal insufficiency. The patient takes 15 mg of prednisone daily. On admission, she was given 125 mg of Solu-Medrol. She was subsequently started on Solu-Cortef IV every 8 hours. -We'll continue current management with the plan to taper over the next 24-48 hours as tolerated.  Acute kidney injury with oliguria The patient's creatinine increased from 1.1-1.8; query the etiology other than the patient not receiving IV fluids due to lack of IV access late 10/31/15-early 11/01/15. Foley catheter seems to be draining dark-colored urine. -Urinalysis on 7/22 was indicative of infection. Will start Cipro cautiously in the setting of C. difficile colitis.  Peripheral edema. Patient is now developing peripheral edema, presumably from her low urine output and diarrhea. Lasix will be given 1. Further adjustments per Dr. Lowanda Foster.  Hypotension secondary to hypovolemia from diarrhea and adrenal insufficiency. Treating with IV steroid and volume repletion with IV fluids. Her blood pressure has improved but is still soft.  Chronic asthma. Currently stable. She was continued on Singulair, Spiriva and Pulmicort.   DVT prophylaxis: Subcutaneous heparin Code Status: Full code Family Communication: Family not available Disposition Plan: Discharge when clinically appropriate, likely in a few days.   Consultants:   None  Procedures:   None  Antimicrobials:   Oral vancomycin 10/30/15  IV metronidazole 10/30/15     Subjective: Patient complains of diffuse abdominal pain and some abdominal bloating. Her sister notices that her legs are more swollen. She denies vomiting, but has had some nausea.  Objective: Filed Vitals:   11/01/15 2145 11/02/15 0500 11/02/15 0822 11/02/15 1421  BP: 115/69 104/60  94/51  Pulse: 97 95  98  Temp: 98.3 F (36.8 C) 98.5 F (36.9 C)  98.6 F (37 C)  TempSrc: Oral Oral  Oral  Resp: 18 18  18   Height:      Weight:  114.52 kg (252 lb 7.5 oz)    SpO2: 97% 96% 96% 90%    Intake/Output Summary (Last 24 hours) at 11/02/15 1442 Last data filed at 11/02/15 1158  Gross per 24 hour  Intake    840 ml  Output      9 ml  Net    831 ml   Filed Weights   10/31/15 0539 11/01/15 0500 11/02/15 0500  Weight: 111.857 kg (246 lb 9.6 oz) 114.805 kg (253 lb 1.6 oz) 114.52 kg (252 lb 7.5 oz)    Examination:  General exam: Appears calm and comfortable; obese 55 year old African-American In no acute distress.  Respiratory system: Clear to auscultation. Respiratory effort normal. Cardiovascular system: S1 & S2 heard, RRR. No JVD, murmurs, rubs, gallops or clicks. 1+ bilateral lower extremity edema. Gastrointestinal system: Abdomen is obese and moderately diffusely tender; mildly to moderately distended No organomegaly or masses felt. Normal bowel sounds heard. GU: Indwelling Foley catheter draining dark cold cola-colored urine. Central nervous system: Alert and oriented. No focal  neurological deficits. Extremities: Symmetric 5 x 5 power. Skin: No rashes, lesions or ulcers Psychiatry: Judgement and insight appear normal. Mood & affect appropriate.     Data Reviewed: I have personally reviewed following labs and imaging studies  CBC:  Recent Labs Lab 10/30/15 1057 10/31/15 0547 11/01/15 0500 11/02/15 0623  WBC 23.7* 25.9* 22.7* 28.6*  NEUTROABS 21.9*  --   --   --   HGB 10.8* 12.3 12.3 10.6*  HCT 32.6* 37.2 37.9 32.0*  MCV 85.6 84.9 85.9 86.0  PLT 493* 429* 420*  [id number]   Basic Metabolic Panel:  Recent Labs Lab 10/30/15 1057 10/31/15 0547 11/01/15 0500 11/01/15 2038 11/02/15 0623 11/02/15 1245  NA 135 138 138 130*  --  135  K 2.1* 3.1* 4.4 4.3  --  2.5*  CL 100* 107 110 108  --  119*  CO2 27 21* 17* 15*  --  12*  GLUCOSE 86 109* 110* 124*  --  107*  BUN 19 22* 30* 34*  --  27*  CREATININE 1.36* 1.12* 1.81* 2.65*  --  1.81*  CALCIUM 7.1* 6.6* 6.3* 6.0*  --  4.1*  MG 1.7  --   --   --   --   --   PHOS  --   --   --   --  1.5*  --    GFR: Estimated Creatinine Clearance: 41.1 mL/min (by C-G formula based on Cr of 1.81). Liver Function Tests:  Recent Labs Lab 10/30/15 1239 11/01/15 2038  AST 16 26  ALT 13* 14  ALKPHOS 88 79  BILITOT 1.1 0.4  PROT 4.8* 4.6*  ALBUMIN 2.1* 1.9*    Recent Labs Lab 10/30/15 1057  LIPASE 9*   No results for input(s): AMMONIA in the last 168 hours. Coagulation Profile: No results for input(s): INR, PROTIME in the last 168 hours. Cardiac Enzymes:  Recent Labs Lab 10/30/15 1239  TROPONINI <0.03   BNP (last 3 results) No results for input(s): PROBNP in the last 8760 hours. HbA1C: No results for input(s): HGBA1C in the last 72 hours. CBG: No results for input(s): GLUCAP in the last 168 hours. Lipid Profile: No results for input(s): CHOL, HDL, LDLCALC, TRIG, CHOLHDL, LDLDIRECT in the last 72 hours. Thyroid Function Tests: No results for input(s): TSH, T4TOTAL, FREET4, T3FREE, THYROIDAB in the last 72 hours. Anemia Panel: No results for input(s): VITAMINB12, FOLATE, FERRITIN, TIBC, IRON, RETICCTPCT in the last 72 hours. Sepsis Labs:  Recent Labs Lab 10/30/15 1239 10/30/15 1525 11/02/15 1245  LATICACIDVEN 1.8 1.5 2.3*    Recent Results (from the past 240 hour(s))  Urine culture     Status: None   Collection Time: 10/30/15 12:38 PM  Result Value Ref Range Status   Specimen Description URINE, CATHETERIZED  Final   Special Requests NONE  Final   Culture NO GROWTH Performed at [facility]   Final   Report Status 10/31/2015 FINAL  Final  Culture, blood (routine x 2)     Status: None (Preliminary result)   Collection Time: 10/30/15 12:39 PM  Result Value Ref Range Status   Specimen Description BLOOD LEFT ANTECUBITAL  Final   Special Requests BOTTLES DRAWN AEROBIC AND ANAEROBIC 10CC EACH  Final   Culture NO GROWTH 3 DAYS  Final   Report Status PENDING  Incomplete  Culture, blood (routine x 2)     Status: None (Preliminary result)   Collection Time: 10/30/15 12:53 PM  Result Value Ref Range Status  Specimen Description BLOOD RIGHT ANTECUBITAL  Final   Special Requests BOTTLES DRAWN AEROBIC AND ANAEROBIC 6CC EACH  Final   Culture NO GROWTH 3 DAYS  Final   Report Status PENDING  Incomplete  Gastrointestinal Panel by PCR , Stool     Status: None   Collection Time: 10/31/15  2:21 AM  Result Value Ref Range Status   Campylobacter species NOT DETECTED NOT DETECTED Final   Plesimonas shigelloides NOT DETECTED NOT DETECTED Final   Salmonella species NOT DETECTED NOT DETECTED Final   Yersinia enterocolitica NOT DETECTED NOT DETECTED Final   Vibrio species NOT DETECTED NOT DETECTED Final   Vibrio cholerae NOT DETECTED NOT DETECTED Final   Enteroaggregative E coli (EAEC) NOT DETECTED NOT DETECTED Final   Enteropathogenic E coli (EPEC) NOT DETECTED NOT DETECTED Final   Enterotoxigenic E coli (ETEC) NOT DETECTED NOT DETECTED Final   Shiga like toxin producing E coli (STEC) NOT DETECTED NOT DETECTED Final   E. coli O157 NOT DETECTED NOT DETECTED Final   Shigella/Enteroinvasive E coli (EIEC) NOT DETECTED NOT DETECTED Final   Cryptosporidium NOT DETECTED NOT DETECTED Final   Cyclospora cayetanensis NOT DETECTED NOT DETECTED Final   Entamoeba histolytica NOT DETECTED NOT DETECTED Final   Giardia lamblia NOT DETECTED NOT DETECTED Final   Adenovirus F40/41 NOT DETECTED NOT DETECTED Final   Astrovirus NOT DETECTED NOT DETECTED Final   Norovirus GI/GII NOT DETECTED NOT  DETECTED Final   Rotavirus A NOT DETECTED NOT DETECTED Final   Sapovirus (I, II, IV, and V) NOT DETECTED NOT DETECTED Final  C difficile quick scan w PCR reflex     Status: Abnormal   Collection Time: 10/31/15  2:21 AM  Result Value Ref Range Status   C Diff antigen POSITIVE (A) NEGATIVE Final   C Diff toxin POSITIVE (A) NEGATIVE Final   C Diff interpretation Toxin producing C. difficile detected.  Final    Comment: CRITICAL RESULT CALLED TO, READ BACK BY AND VERIFIED WITH: Marylouise Stacks AT [id number] ON [id number] BY FORSYTH K   Stool culture (children & immunocomp patients)     Status: None (Preliminary result)   Collection Time: 10/31/15  2:21 AM  Result Value Ref Range Status   Salmonella/Shigella Screen PENDING  Incomplete   Campylobacter Culture PENDING  Incomplete   E coli, Shiga toxin Assay Negative Negative Final    Comment: (NOTE) Performed At: [facility] [place], [place] [id number] Lindon Romp MD [id number]          Radiology Studies: No results found.      Scheduled Meds: . albumin human  25 g Intravenous BID  . budesonide  0.5 mg Nebulization BID  . ciprofloxacin  400 mg Intravenous Q24H  . citalopram  20 mg Oral QHS  . DULoxetine  30 mg Oral Daily  . furosemide  80 mg Intravenous BID  . gabapentin  600 mg Oral TID  . heparin subcutaneous  5,000 Units Subcutaneous Q8H  . hydrocortisone sod succinate (SOLU-CORTEF) inj  25 mg Intravenous Q12H  . latanoprost  1 drop Both Eyes QHS  . loratadine  10 mg Oral Daily  . metronidazole  500 mg Intravenous Q8H  . montelukast  10 mg Oral QHS  . phosphorus  500 mg Oral BID  . tiotropium  1 capsule Inhalation Daily  . vancomycin  250 mg Oral Q6H   Continuous Infusions: . dextrose 5 % 1,000 mL with potassium chloride 10 mEq, sodium bicarbonate 100 mEq infusion  LOS: 3 days    Time spent: 73 minutes    Rexene Alberts, MD [facility] Pager [phone]  If 7PM-7AM, please  contact night-coverage [email or web address] Password [email or web address] 11/02/2015, 2:42 PM

## 2015-11-02 NOTE — Progress Notes (Signed)
Patient still has not yet voided. Creatinine level increased today 7/20 0547: 1.12, 7/21 0500: 1.81 and then 7/21 2038 2.65. Notified Dr.Le who ordered NS bolus 1,056ml. Medication hung. Will continue to monitor output.

## 2015-11-02 NOTE — Progress Notes (Signed)
Pharmacy Antibiotic Note  Deborah Murray is a 55 y.o. female admitted on 10/30/2015 with UTI.  Pharmacy has been consulted for cipro dosing.  Plan: Cipro 400 mg IV q24 hours F/u renal function, cultures and clinical course  Height: 5\' 2"  (157.5 cm) Weight: 252 lb 7.5 oz (114.52 kg) IBW/kg (Calculated) : 50.1  Temp (24hrs), Avg:98.5 F (36.9 C), Min:98.3 F (36.8 C), Max:98.7 F (37.1 C)   Recent Labs Lab 10/30/15 1057 10/30/15 1239 10/30/15 1525 10/31/15 0547 11/01/15 0500 11/01/15 2038 11/02/15 0623  WBC 23.7*  --   --  25.9* 22.7*  --  28.6*  CREATININE 1.36*  --   --  1.12* 1.81* 2.65*  --   LATICACIDVEN  --  1.8 1.5  --   --   --   --     Estimated Creatinine Clearance: 28.1 mL/min (by C-G formula based on Cr of 2.65).    Allergies  Allergen Reactions  . Aspirin Shortness Of Breath and Palpitations  . Codeine Sulfate Shortness Of Breath and Palpitations  . Darvocet [Propoxyphene N-Acetaminophen] Shortness Of Breath and Palpitations  . Tramadol Shortness Of Breath  . Penicillins Other (See Comments)    Has patient had a PCN reaction causing immediate rash, facial/tongue/throat swelling, SOB or lightheadedness with hypotension: No Has patient had a PCN reaction causing severe rash involving mucus membranes or skin necrosis: No Has patient had a PCN reaction that required hospitalization No Has patient had a PCN reaction occurring within the last 10 years: Yes If all of the above answers are "NO", then may proceed with Cephalosporin use.     Antimicrobials this admission: flagyl 7/19 >>  cipro 7/22 >>  Po vanc for C diff 7/20>>  Microbiology results: 7/20 C diff positive 7/19 BCx: ngtd 7/20 UCx: ng final    Thank you for allowing pharmacy to be a part of this patient's care.  Excell Seltzer Poteet 11/02/2015 11:37 AM

## 2015-11-02 NOTE — Progress Notes (Signed)
[phone].Marland Kitchen Pt with complex issues.  Spoke with MD throughout this time. Orders received and initiated as given throughout this time Flexiseal placed, Bladder scan 319 cc of urine noted.  Irrigated foley with 65 cc of. Urine of irrigate and urine. Pt tolerated well. No resistance met.  Urine cleared from dark tea color to light tea color. Report given to oncoming RN

## 2015-11-02 NOTE — Progress Notes (Signed)
Spoke with Dr. Caryn Section about pt's scheduled IV lasix. Pt's manual BP is 102/72. Per MD, the nephrologist ordered the IV lasix and is aware the pt's BP has been on the soft side. Dr. Caryn Section asked me to give the IV lasix, but wait until pt returned back from abd CT. IV lasix rescheduled to be given at [place] so that dose can be given after CT scan. In addition, MD asked that pt be accompanied to CT on heart monitor by a nurse. Monitor placed in room so that pt can be accompanied to CT by nurse.

## 2015-11-02 NOTE — Consult Note (Signed)
Reason for Consult: Acute kidney injury, hypocalcemia Referring Physician: Dr. [name] is an 55 y.o. female.  HPI: She is a patient with history of hypertension, a trial fibrillation, obesity presently came with history of diarrhea multiple times a day for the last several weeks. Patient also complains of some abdominal pain, nausea but no vomiting. When patient was evaluated she was found to have C. difficile colitis hence admitted to the hospital for further treatment. Presently still patient complains of abdominal pain and diarrhea. Presently her creatinine seems to be increasing hence consult is called. Patient claims she is now feeling much better. States she has some nausea but no vomiting. Abdominal pain is getting better. She has some difficulty in breathing but states that that his stable as she has asthma and she has been on inhaler before.  Past Medical History  Diagnosis Date  . Asthma   . Hyperlipidemia   . Hypertension   . Syncope and collapse 05/11/11  adrenal insuffiency  treated at [facility]  . Thyroid disease   . Headache   . Vocal cord dysfunction   . Chronic back pain   . Lumbar radiculopathy   . Adrenal abnormality ([place])   . COPD (chronic obstructive pulmonary disease) ([place])   . Atrial fibrillation ([place])   . Adrenal insufficiency ([place]) 2013    Tx at [facility]    Past Surgical History  Procedure Laterality Date  . Cardiac catheterization  2012 at [facility]  . 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 Abdominal hysterectomy    . Cholecystectomy, laparoscopic    . Eye surg for glaucoma    . Right knee surg    . Cholecystectomy    . Esophagogastroduodenoscopy N/A 09/17/2015    Procedure: ESOPHAGOGASTRODUODENOSCOPY (EGD);  Surgeon: Daneil Dolin, MD;  [facility]: [facility];  Service: Endoscopy;  Laterality: N/A;  0488  . Back surgery      disc     Family History  Problem Relation Age of Onset  . Heart attack Mother   . Hypertension Mother    . Heart attack Father   . Asthma Father   . Hyperlipidemia Father   . Hypertension Father   . Heart attack Sister   . Arrhythmia Sister   . Asthma Sister   . Hyperlipidemia Sister   . Hypertension Sister   . Asthma Brother   . Hypertension Brother     Social History:  reports that she has never smoked. She has never used smokeless tobacco. She reports that she does not drink alcohol or use illicit drugs.  Allergies:  Allergies  Allergen Reactions  . Aspirin Shortness Of Breath and Palpitations  . Codeine Sulfate Shortness Of Breath and Palpitations  . Darvocet [Propoxyphene N-Acetaminophen] Shortness Of Breath and Palpitations  . Tramadol Shortness Of Breath  . Penicillins Other (See Comments)    Has patient had a PCN reaction causing immediate rash, facial/tongue/throat swelling, SOB or lightheadedness with hypotension: No Has patient had a PCN reaction causing severe rash involving mucus membranes or skin necrosis: No Has patient had a PCN reaction that required hospitalization No Has patient had a PCN reaction occurring within the last 10 years: Yes If all of the above answers are "NO", then may proceed with Cephalosporin use.     Medications: I have reviewed the patient's current medications.  Results for orders placed or performed during the hospital encounter of 10/30/15 (from the past 48 hour(s))  CBC     Status: Abnormal   Collection Time: 11/01/15  5:00 AM  Result Value Ref Range   WBC 22.7 (H) 4.0 - 10.5 K/uL   RBC 4.41 3.87 - 5.11 MIL/uL   Hemoglobin 12.3 12.0 - 15.0 g/dL   HCT 37.9 36.0 - 46.0 %   MCV 85.9 78.0 - 100.0 fL   MCH 27.9 26.0 - 34.0 pg   MCHC 32.5 30.0 - 36.0 g/dL   RDW 16.1 (H) 11.5 - 15.5 %   Platelets 420 (H) 150 - 400 K/uL  Basic metabolic panel     Status: Abnormal   Collection Time: 11/01/15  5:00 AM  Result Value Ref Range   Sodium 138 135 - 145 mmol/L   Potassium 4.4 3.5 - 5.1 mmol/L    Comment: DELTA CHECK NOTED   Chloride 110 101  - 111 mmol/L   CO2 17 (L) 22 - 32 mmol/L   Glucose, Bld 110 (H) 65 - 99 mg/dL   BUN 30 (H) 6 - 20 mg/dL   Creatinine, Ser 1.81 (H) 0.44 - 1.00 mg/dL   Calcium 6.3 (LL) 8.9 - 10.3 mg/dL    Comment: CRITICAL RESULT CALLED TO, READ BACK BY AND VERIFIED WITH: [name],J AT 7:00AM ON 11/01/15 BY [name],C    GFR calc non Af Amer 30 (L) >60 mL/min   GFR calc Af Amer 35 (L) >60 mL/min    Comment: (NOTE) The eGFR has been calculated using the CKD EPI equation. This calculation has not been validated in all clinical situations. eGFR's persistently <60 mL/min signify possible Chronic Kidney Disease.    Anion gap 11 5 - 15  Comprehensive metabolic panel     Status: Abnormal   Collection Time: 11/01/15  8:38 PM  Result Value Ref Range   Sodium 130 (L) 135 - 145 mmol/L    Comment: DELTA CHECK NOTED   Potassium 4.3 3.5 - 5.1 mmol/L   Chloride 108 101 - 111 mmol/L   CO2 15 (L) 22 - 32 mmol/L   Glucose, Bld 124 (H) 65 - 99 mg/dL   BUN 34 (H) 6 - 20 mg/dL   Creatinine, Ser 2.65 (H) 0.44 - 1.00 mg/dL   Calcium 6.0 (LL) 8.9 - 10.3 mg/dL    Comment: CRITICAL RESULT CALLED TO, READ BACK BY AND VERIFIED WITH: [name],K ON 11/01/15 AT 2150 BY [name],C    Total Protein 4.6 (L) 6.5 - 8.1 g/dL   Albumin 1.9 (L) 3.5 - 5.0 g/dL   AST 26 15 - 41 U/L   ALT 14 14 - 54 U/L   Alkaline Phosphatase 79 38 - 126 U/L   Total Bilirubin 0.4 0.3 - 1.2 mg/dL   GFR calc non Af Amer 19 (L) >60 mL/min   GFR calc Af Amer 22 (L) >60 mL/min    Comment: (NOTE) The eGFR has been calculated using the CKD EPI equation. This calculation has not been validated in all clinical situations. eGFR's persistently <60 mL/min signify possible Chronic Kidney Disease.    Anion gap 7 5 - 15  Urinalysis, Routine w reflex microscopic (not at [facility])     Status: Abnormal   Collection Time: 11/02/15  5:25 AM  Result Value Ref Range   Color, Urine BROWN (A) YELLOW    Comment: BIOCHEMICALS MAY BE AFFECTED BY COLOR   APPearance CLOUDY  (A) CLEAR   Specific Gravity, Urine 1.025 1.005 - 1.030   pH 5.0 5.0 - 8.0   Glucose, UA 100 (A) NEGATIVE mg/dL  Hgb urine dipstick LARGE (A) NEGATIVE   Bilirubin Urine MODERATE (A) NEGATIVE   Ketones, ur 15 (A) NEGATIVE mg/dL   Protein, ur 100 (A) NEGATIVE mg/dL   Nitrite POSITIVE (A) NEGATIVE   Leukocytes, UA TRACE (A) NEGATIVE  Urine microscopic-add on     Status: Abnormal   Collection Time: 11/02/15  5:25 AM  Result Value Ref Range   Squamous Epithelial / LPF 0-5 (A) NONE SEEN   WBC, UA 6-30 0 - 5 WBC/hpf   RBC / HPF 6-30 0 - 5 RBC/hpf   Bacteria, UA MANY (A) NONE SEEN   Casts HYALINE CASTS (A) NEGATIVE    Comment: GRANULAR CAST  CBC     Status: Abnormal   Collection Time: 11/02/15  6:23 AM  Result Value Ref Range   WBC 28.6 (H) 4.0 - 10.5 K/uL   RBC 3.72 (L) 3.87 - 5.11 MIL/uL   Hemoglobin 10.6 (L) 12.0 - 15.0 g/dL   HCT 32.0 (L) 36.0 - 46.0 %   MCV 86.0 78.0 - 100.0 fL   MCH 28.5 26.0 - 34.0 pg   MCHC 33.1 30.0 - 36.0 g/dL   RDW 16.2 (H) 11.5 - 15.5 %   Platelets 267 150 - 400 K/uL  Phosphorus     Status: Abnormal   Collection Time: 11/02/15  6:23 AM  Result Value Ref Range   Phosphorus 1.5 (L) 2.5 - 4.6 mg/dL    No results found.  Review of Systems  Constitutional: Positive for chills.  Respiratory: Positive for shortness of breath. Negative for cough and hemoptysis.   Cardiovascular: Positive for leg swelling. Negative for chest pain and orthopnea.  Gastrointestinal: Positive for nausea, abdominal pain and diarrhea. Negative for vomiting.  Neurological: Positive for weakness.   Blood pressure 104/60, pulse 95, temperature 98.5 F (36.9 C), temperature source Oral, resp. rate 18, height 5' 2"  (1.575 m), weight 114.52 kg (252 lb 7.5 oz), SpO2 96 %. Physical Exam  Constitutional: She is oriented to person, place, and time. No distress.  Eyes: No scleral icterus.  Neck: No JVD present.  Cardiovascular: Normal rate and regular rhythm.   No murmur  heard. Respiratory: No respiratory distress. She has no rales. She exhibits no tenderness.  Decreased breath sound bilaterally  GI: There is tenderness. There is no rebound.  Obese  Musculoskeletal: She exhibits edema.  Neurological: She is alert and oriented to person, place, and time.    Assessment/Plan: Problem #1 acute kidney injury. Presently seems to be oliguric. Her BUN and creatinine has increased significantly. Possibly ATN. Patient denies any previous history of renal failure or kidney stone. Problem #2 C. difficile colitis: Presently she is on by mouth vancomycin. Patient presently afebrile but her white blood cell count is high. Patient also continued to have diarrhea. Problem #3 hypotension: Most likely from her infection. Patient presently is not on any antihypertensive medications. Problem #4 history of asthma Problem #5 Hypokalemia: Her potassium has corrected Problem #6 low CO2 possibly metabolic. Patient presently on sodium bicarbonate Problem #7 hypocalcemia: Calcium is 6 and corrected for albumin of 1.9 calcium is above 7.6. Problem #8 obesity  Problem #9 hypophosphatemia Plan: We'll check ABG, urine chloride, today. We'll start patient on Lasix 80 mg IV twice a day We'll use albumin 25% IV twice a day We'll start patient on K-Phos Neutra 500 mg by mouth twice a day We'll decrease potassium to 10 mEq  In the IV fluid until get back her basic metabolic panel from today We'll  check her basic metabolic panel in the morning  Deborah Murray S 11/02/2015, 12:28 PM

## 2015-11-02 NOTE — Progress Notes (Signed)
After 2nd 1000 ml bolus patient finally voided 10 ml's of dark brown colored urine. Specimen collected and sent to lab as ordered.

## 2015-11-03 ENCOUNTER — Inpatient Hospital Stay (HOSPITAL_COMMUNITY): Payer: Medicare Other

## 2015-11-03 DIAGNOSIS — A047 Enterocolitis due to Clostridium difficile: Secondary | ICD-10-CM

## 2015-11-03 DIAGNOSIS — E876 Hypokalemia: Secondary | ICD-10-CM

## 2015-11-03 DIAGNOSIS — K5100 Ulcerative (chronic) pancolitis without complications: Secondary | ICD-10-CM

## 2015-11-03 LAB — BASIC METABOLIC PANEL
ANION GAP: 8 (ref 5–15)
BUN: 37 mg/dL — AB (ref 6–20)
CHLORIDE: 104 mmol/L (ref 101–111)
CO2: 19 mmol/L — AB (ref 22–32)
Calcium: 6.8 mg/dL — ABNORMAL LOW (ref 8.9–10.3)
Creatinine, Ser: 2.1 mg/dL — ABNORMAL HIGH (ref 0.44–1.00)
GFR calc Af Amer: 29 mL/min — ABNORMAL LOW (ref >60)
GFR calc non Af Amer: 25 mL/min — ABNORMAL LOW (ref >60)
GLUCOSE: 119 mg/dL — AB (ref 65–99)
POTASSIUM: 4.1 mmol/L (ref 3.5–5.1)
Sodium: 131 mmol/L — ABNORMAL LOW (ref 135–145)

## 2015-11-03 LAB — CBC
HEMATOCRIT: 36.9 % (ref 36.0–46.0)
HEMOGLOBIN: 12.5 g/dL (ref 12.0–15.0)
MCH: 28 pg (ref 26.0–34.0)
MCHC: 33.9 g/dL (ref 30.0–36.0)
MCV: 82.6 fL (ref 78.0–100.0)
Platelets: 272 10*3/uL (ref 150–400)
RBC: 4.47 MIL/uL (ref 3.87–5.11)
RDW: 16 % — ABNORMAL HIGH (ref 11.5–15.5)
WBC: 32.2 10*3/uL — AB (ref 4.0–10.5)

## 2015-11-03 LAB — MRSA PCR SCREENING: MRSA BY PCR: NEGATIVE

## 2015-11-03 LAB — CALCIUM, IONIZED: Calcium, Ionized, Serum: 3.3 mg/dL — ABNORMAL LOW (ref 4.5–5.6)

## 2015-11-03 LAB — URINE CULTURE: Culture: NO GROWTH

## 2015-11-03 LAB — CK: Total CK: 36 U/L — ABNORMAL LOW (ref 38–234)

## 2015-11-03 LAB — MAGNESIUM
MAGNESIUM: 2.1 mg/dL (ref 1.7–2.4)
Magnesium: 2.1 mg/dL (ref 1.7–2.4)

## 2015-11-03 MED ORDER — MUPIROCIN 2 % EX OINT: 1.0000 "application " | TOPICAL_OINTMENT | Freq: Two times a day (BID) | CUTANEOUS | Status: DC

## 2015-11-03 MED ORDER — PHENYLEPHRINE HCL 10 MG/ML IJ SOLN
0.0000 ug/min | INTRAVENOUS | Status: DC
  Administered 2015-11-03 (×2): 20 ug/min via INTRAVENOUS
  Administered 2015-11-04: 10 ug/min via INTRAVENOUS
  Filled 2015-11-03: qty 1

## 2015-11-03 MED ORDER — POTASSIUM CL IN DEXTROSE 5% 20 MEQ/L IV SOLN
20.0000 meq | INTRAVENOUS | Status: DC
  Administered 2015-11-03 – 2015-11-05 (×5): 20 meq via INTRAVENOUS

## 2015-11-03 MED ORDER — HYDROCORTISONE NA SUCCINATE PF 100 MG IJ SOLR
50.0000 mg | Freq: Three times a day (TID) | INTRAMUSCULAR | Status: DC
  Administered 2015-11-03 – 2015-11-06 (×8): 50 mg via INTRAVENOUS
  Filled 2015-11-03 (×7): qty 2

## 2015-11-03 MED ORDER — CHLORHEXIDINE GLUCONATE CLOTH 2 % EX PADS: 6.0000 | MEDICATED_PAD | Freq: Every day | CUTANEOUS | Status: DC

## 2015-11-03 MED ORDER — PHENYLEPHRINE HCL 10 MG/ML IJ SOLN
INTRAMUSCULAR | Status: AC
  Filled 2015-11-03: qty 1

## 2015-11-03 MED ORDER — FUROSEMIDE 10 MG/ML IJ SOLN
80.0000 mg | Freq: Two times a day (BID) | INTRAMUSCULAR | Status: DC
  Administered 2015-11-03 – 2015-11-04 (×2): 80 mg via INTRAVENOUS
  Filled 2015-11-03 (×2): qty 8

## 2015-11-03 MED ORDER — POTASSIUM CHLORIDE 2 MEQ/ML IV SOLN: INTRAVENOUS | Status: DC

## 2015-11-03 NOTE — Consult Note (Signed)
Referring Provider: Rexene Alberts, MD Primary Care Physician:  Sol Passer, MD Primary Gastroenterologist:  Dr. Laural Golden  Reason for Consultation:    C. difficile colitis not responding to therapy.  HPI:   Patient is 55 year old African-American female who was admitted to hospitalist service for days ago while emergency room where she presented with nausea vomiting diarrhea and abdominal pain. Patient states she has had diarrhea for several weeks. It had gotten worse in the preceding few days prior to admission and she was having more abdominal pain primarily on the left side. Now she is complaining of generalized pain. She recalls taking Levaquin for 10 days over 2 months ago. She had lumbar spine surgery at [facility] on 10/17/2015 and was discharged on 10/21/2015. She states she did have diarrhea during that admission and was treated with Imodium but did not have any stool studies. She also complains of anorexia and 30 pound weight loss over the last few months. She has been dealing with severe abdominal pain for several months. She does note the pain medication has been increased over the last couple of months. There is no prior history of C. difficile colitis. She denies rectal bleeding. She states she's been having fever as high as 102 F. Patient was begun on vancomycin on 10/31/2015 at a dose of 250 mg by mouth 4 times a day. She also received single vancomycin enema early this morning. This subsequently has been discontinued by Dr. Rexene Alberts. Vancomycin dose was increased to 500 mg 4 times a day earlier today. Patient was transferred to ICU earlier today for closer monitoring. Patient serum creatinine on admission was 1.36. Peak to 2.46 yesterday and is down to 2.10 today. Dr. Hinda Lenis of nephrology was consulted yesterday. Patient is receiving IV albumin as well as furosemide. Ration continues to complain of nausea and generalized abdominal pain. She states she feels worse than  she did yesterday. She had blood cultures on admission and these are negative.  Patient had colonoscopy at [facility] in July 2010 which according to patient was normal.  Patient is retired [employer]. She's been retired for 8 years. She has 2 grownup daughters. She has never married. He does not smoke cigarettes or drink alcohol. She lives alone.    Past Medical History:  Diagnosis Date      . Adrenal insufficiency ([place]) 2013   Tx at [facility]  . Asthma   . Atrial fibrillation ([place])   . Chronic back pain   . COPD (chronic obstructive pulmonary disease) ([place])   . Headache   . Hyperlipidemia   . Hypertension   . Lumbar radiculopathy   . Syncope and collapse Obesity 05/11/11  adrenal insuffiency  treated at [facility]  . Thyroid disease   . Vocal cord dysfunction     Past Surgical History:  Procedure Laterality Date  . ABDOMINAL HYSTERECTOMY    . BACK SURGERY     disc   . CARDIAC CATHETERIZATION  2012 at [facility]  . CHOLECYSTECTOMY    . CHOLECYSTECTOMY, LAPAROSCOPIC    . ESOPHAGOGASTRODUODENOSCOPY N/A 09/17/2015   Procedure: ESOPHAGOGASTRODUODENOSCOPY (EGD);  Surgeon: Daneil Dolin, MD;  Location[facility];  Service: Endoscopy;  Laterality: [id number]  . eye surg for glaucoma    . gastic by-pass  05-11-11   Gastric by-pass in 2008  . right knee surg      Prior to Admission medications   Medication Sig Start Date End Date Taking? Authorizing Provider  atorvastatin (  LIPITOR) 20 MG tablet Take 20 mg by mouth every evening.    Yes Historical Provider, MD  clindamycin (CLEOCIN T) 1 % external solution Apply 1 application topically every evening. Applies to underarms 11/08/09  Yes Historical Provider, MD  fluticasone (FLOVENT HFA) 220 MCG/ACT inhaler Inhale 2 puffs into the lungs every 4 (four) hours. 06/24/09  Yes Historical Provider, MD  ipratropium (ATROVENT) 0.02 % nebulizer solution Inhale 2 mLs into the lungs 4 (four) times daily. 06/16/15  Yes Historical Provider, MD  latanoprost  (XALATAN) 0.005 % ophthalmic solution Place 1 drop into both eyes at bedtime.   Yes Historical Provider, MD  levocetirizine (XYZAL) 5 MG tablet Take 5 mg by mouth every evening. 06/04/15  Yes Historical Provider, MD  omalizumab Arvid Right) 150 MG injection Inject 300 mg into the skin every 28 (twenty-eight) days.  06/11/15  Yes Historical Provider, MD  albuterol (PROVENTIL HFA;VENTOLIN HFA) 108 (90 BASE) MCG/ACT inhaler Inhale 2 puffs into the lungs every 4 (four) hours as needed for wheezing.     Historical Provider, MD  albuterol (PROVENTIL) (2.5 MG/3ML) 0.083% nebulizer solution Take 2.5 mg by nebulization every 6 (six) hours as needed for wheezing or shortness of breath.    Historical Provider, MD  aluminum chloride (DRYSOL) 20 % external solution Apply 1 application topically at bedtime as needed (for sweating and applies under arms).     Historical Provider, MD  calcium citrate (CALCITRATE - DOSED IN MG ELEMENTAL CALCIUM) 950 MG tablet Take 1 tablet by mouth daily.    Historical Provider, MD  Cholecalciferol (VITAMIN D) 2000 UNITS tablet Take 2,000 Units by mouth daily.    Historical Provider, MD  citalopram (CELEXA) 20 MG tablet Take 20 mg by mouth at bedtime.     Historical Provider, MD  DULoxetine (CYMBALTA) 30 MG capsule Take 30 mg by mouth daily.  08/10/15   Historical Provider, MD  EPINEPHrine (EPIPEN JR) 0.15 MG/0.3ML injection Inject 0.15 mg into the muscle Once PRN. 06/11/15   Historical Provider, MD  fluticasone (FLONASE) 50 MCG/ACT nasal spray Place 2 sprays in each nostril daily as needed for allergies 06/12/15   Historical Provider, MD  gabapentin (NEURONTIN) 300 MG capsule Take 900 mg by mouth 3 (three) times daily.     Historical Provider, MD  HYDROcodone-acetaminophen (NORCO) 5-325 MG tablet Take 1-2 tablets by mouth every 4 (four) hours as needed for moderate pain or severe pain.  08/30/15   Historical Provider, MD  lisinopril-hydrochlorothiazide (PRINZIDE,ZESTORETIC) 20-25 MG per tablet  Take 1 tablet by mouth daily.    Historical Provider, MD  metoprolol tartrate (LOPRESSOR) 25 MG tablet Take 12.5 mg by mouth 2 (two) times daily. 07/18/15   Historical Provider, MD  montelukast (SINGULAIR) 10 MG tablet Take 10 mg by mouth at bedtime.    Historical Provider, MD  Multiple Vitamin (MULTIVITAMIN) capsule Take 1 capsule by mouth daily.    Historical Provider, MD  nitroGLYCERIN (NITROSTAT) 0.4 MG SL tablet Place 0.4 mg under the tongue every 5 (five) minutes as needed for chest pain.    Historical Provider, MD  ondansetron (ZOFRAN ODT) 8 MG disintegrating tablet Take 1 tablet (8 mg total) by mouth every 8 (eight) hours as needed for nausea or vomiting. Patient not taking: Reported on 08/28/2015 06/08/15   Evalee Jefferson, PA-C  ondansetron (ZOFRAN) 4 MG tablet Take 1 tablet (4 mg total) by mouth every 8 (eight) hours as needed for nausea or vomiting. 08/28/15   Carlis Stable, NP  phentermine 15  MG capsule Take 15 mg by mouth as needed (severe nausea and vomitting).     Historical Provider, MD  potassium chloride SA (K-DUR,KLOR-CON) 20 MEQ tablet Take 20 mEq by mouth 2 (two) times daily.    Historical Provider, MD  predniSONE (DELTASONE) 10 MG tablet Take 10 mg by mouth daily.  07/19/15   Historical Provider, MD  promethazine (PHENERGAN) 25 MG tablet Take 25 mg by mouth every 6 (six) hours as needed for nausea or vomiting.  06/26/09   Historical Provider, MD  rivaroxaban (XARELTO) 20 MG TABS tablet Take 1 tablet (20 mg total) by mouth daily with supper. 05/13/14   Modena Jansky, MD  tiotropium (SPIRIVA HANDIHALER) 18 MCG inhalation capsule Place 1 capsule into inhaler and inhale daily. 12/09/09   Historical Provider, MD  VOLTAREN 1 % GEL Apply 2 g topically as needed (for OA pain).  08/02/15   Historical Provider, MD    Current Facility-Administered Medications  Medication Dose Route Frequency Provider Last Rate Last Dose  . albumin human 25 % solution 25 g  25 g Intravenous BID Fran Lowes, MD    25 g at 11/03/15 1141  . albuterol (PROVENTIL) (2.5 MG/3ML) 0.083% nebulizer solution 2.5 mg  2.5 mg Nebulization Q6H PRN Roney Jaffe, MD      . budesonide (PULMICORT) nebulizer solution 0.5 mg  0.5 mg Nebulization BID Roney Jaffe, MD   0.5 mg at 11/03/15 0749  . citalopram (CELEXA) tablet 20 mg  20 mg Oral QHS Roney Jaffe, MD   20 mg at 11/02/15 2242  . dextrose 5 % with KCl 20 mEq / L  infusion  20 mEq Intravenous Continuous Rexene Alberts, MD 125 mL/hr at 11/03/15 1400 20 mEq at 11/03/15 1400  . DULoxetine (CYMBALTA) DR capsule 30 mg  30 mg Oral Daily Roney Jaffe, MD   30 mg at 11/03/15 0933  . furosemide (LASIX) injection 80 mg  80 mg Intravenous BID Fran Lowes, MD      . gabapentin (NEURONTIN) capsule 600 mg  600 mg Oral TID Roney Jaffe, MD   600 mg at 11/03/15 1140  . heparin injection 5,000 Units  5,000 Units Subcutaneous Q8H Rexene Alberts, MD   5,000 Units at 11/03/15 1307  . hydrocortisone sodium succinate (SOLU-CORTEF) 100 MG injection 25 mg  25 mg Intravenous Q12H Rexene Alberts, MD   25 mg at 11/03/15 1140  . HYDROmorphone (DILAUDID) injection 0.5-1 mg  0.5-1 mg Intravenous Q3H PRN Rexene Alberts, MD   1 mg at 11/03/15 1321  . latanoprost (XALATAN) 0.005 % ophthalmic solution 1 drop  1 drop Both Eyes QHS Roney Jaffe, MD   1 drop at 11/01/15 2311  . loratadine (CLARITIN) tablet 10 mg  10 mg Oral Daily Roney Jaffe, MD   10 mg at 11/03/15 0933  . metroNIDAZOLE (FLAGYL) IVPB 500 mg  500 mg Intravenous Q8H Roney Jaffe, MD   500 mg at 11/03/15 1307  . montelukast (SINGULAIR) tablet 10 mg  10 mg Oral QHS Roney Jaffe, MD   10 mg at 11/02/15 2241  . ondansetron (ZOFRAN) tablet 4 mg  4 mg Oral Q6H PRN Roney Jaffe, MD   4 mg at 11/01/15 1150   Or  . ondansetron (ZOFRAN) injection 4 mg  4 mg Intravenous Q6H PRN Roney Jaffe, MD   4 mg at 11/01/15 2312  . phenylephrine (NEO-SYNEPHRINE) 10 mg in dextrose 5 % 250 mL (0.04 mg/mL) infusion  0-400 mcg/min Intravenous  Titrated Rexene Alberts, MD 30 mL/hr  at 11/03/15 1400 20 mcg/min at 11/03/15 1400  . phosphorus (K PHOS NEUTRAL) tablet 500 mg  500 mg Oral BID Fran Lowes, MD   500 mg at 11/03/15 0933  . tiotropium (SPIRIVA) inhalation capsule 18 mcg  1 capsule Inhalation Daily Roney Jaffe, MD   18 mcg at 11/03/15 0749  . vancomycin (VANCOCIN) 50 mg/mL oral solution 500 mg  500 mg Oral Q6H Orvan Falconer, MD   500 mg at 11/03/15 1141    Allergies as of 10/30/2015 - Review Complete 10/30/2015  Allergen Reaction Noted  . Aspirin Shortness Of Breath and Palpitations 05/11/2011  . Codeine sulfate Shortness Of Breath and Palpitations 05/11/2011  . Darvocet [propoxyphene n-acetaminophen] Shortness Of Breath and Palpitations 05/11/2011  . Tramadol Shortness Of Breath 10/30/2014  . Penicillins Other (See Comments) 05/11/2011    Family History  Problem Relation Age of Onset  . Heart attack Mother   . Hypertension Mother   . Heart attack Father   . Asthma Father   . Hyperlipidemia Father   . Hypertension Father   . Heart attack Sister   . Arrhythmia Sister   . Asthma Sister   . Hyperlipidemia Sister   . Hypertension Sister   . Asthma Brother   . Hypertension Brother     Social History   Social History  . Marital status: Single    Spouse name: N/A  . Number of children: N/A  . Years of education: N/A   Occupational History  . Not on file.   Social History Main Topics  . Smoking status: Never Smoker  . Smokeless tobacco: Never Used  . Alcohol use No  . Drug use: No  . Sexual activity: No   Other Topics Concern  . Not on file   Social History Narrative  . No narrative on file    Review of Systems: See HPI, otherwise normal ROS  Physical Exam: Temp:  [97.1 F (36.2 C)-99.6 F (37.6 C)] 97.1 F (36.2 C) (07/23 1315) Pulse Rate:  [85-107] 96 (07/23 1400) Resp:  [16-28] 22 (07/23 1400) BP: (85-146)/(46-107) 106/83 (07/23 1400) SpO2:  [94 %-100 %] 96 % (07/23 1400) Weight:  [278  lb (126.1 kg)] 278 lb (126.1 kg) (07/23 0600) Last BM Date: 11/01/15  Patient is alert and in no acute distress. Conjunctiva was pink. Sclerae nonicteric. Oropharyngeal mucosa is normal. No neck masses or thyromegaly noted. Cardiac exam with regular rhythm normal S1 and S2. No murmur or gallop noted. Abdomen is full with lower midline scar. Bowel sounds are normal. On palpation abdomen is soft but with generalized tenderness. She has more tenderness across upper abdomen with some guarding but no rebound. No organomegaly or masses. She has 2+ pitting edema involving both legs.  Lab Results:  Recent Labs  11/01/15 0500 11/02/15 0623 11/03/15 0806  WBC 22.7* 28.6* 32.2*  HGB 12.3 10.6* 12.5  HCT 37.9 32.0* 36.9  PLT 420* 267 272   BMET  Recent Labs  11/02/15 1245 11/02/15 2252 11/03/15 0806  NA 135 130* 131*  K 2.5* 4.5 4.1  CL 119* 105 104  CO2 12* 20* 19*  GLUCOSE 107* 117* 119*  BUN 27* 36* 37*  CREATININE 1.81* 2.46* 2.10*  CALCIUM 4.1* 6.5* 6.8*   LFT  Recent Labs  11/01/15 2038  PROT 4.6*  ALBUMIN 1.9*  AST 26  ALT 14  ALKPHOS 79  BILITOT 0.4    Studies/Results: Abdominopelvic CT films from 719 and 11/02/2015 reviewed. Yesterday's study shows small pericardial and  pleural effusions diffuse colonic wall thickening, mesial colonic edema there is more gas in the colon but it is not normally distended.   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 Therefore I agree with increasing vancomycin dose to 2 g per day in divided dose. Patient is at risk for toxic megacolon but I do not believe she has megacolon on comparing ET from 10/30/2015 and from yesterday.  Ascites is secondary to to C. difficile colitis and hyperalbuminemia and not necessarily indicated above and other diagnosis.  History of adrenal insufficiency. Patient is on IV hydrocortisone at a dose of 25 mg twice daily  which may be insufficient given her acute illness.  Acute renal injury secondary to intravascular volume depletion/dehydration. Renal function has improved in the last 24 hours. Dr. Rhona Leavens help appreciated. Patient's serum albumin was 1.9 two days ago. Therefore she would benefit from IV albumin H was begun this morning.   Recommendations;  Continue vancomycin at a dose of 500 mg by mouth every 6 hours. Agree with stopping vancomycin enemas as risk outweighs benefit which is questionable at best. Increase hydrocortisone to 50 mg IV 8 hours. Continue NPO status except for by mouth medications. The sitter NG tube placement if patient starts vomiting. Portable plain abdominal film in a.m. Patient will be reevaluated tomorrow morning.    LOS: 4 days   Najeeb Rehman  11/03/2015, 2:24 PM

## 2015-11-03 NOTE — Progress Notes (Addendum)
PROGRESS NOTE    Deborah Murray  [id number] DOB: 04-18-1960 DOA: 10/30/2015 PCP: Sol Passer, MD    Brief Narrative:  Patient is a 55 y.o. female with history of atrial fibrillation, HTN severe asthma/allergies, adrenal insufficiency, and recent back surgery, who presented to the ED on 10/30/2015 with a chief complaint of abdominal pain and diarrhea. She was discharged from the hospital from back surgery approximately 2 weeks ago. When she got home, she started having loose stools. She does not recall being treated with an antibiotic during the hospitalization for back surgery. She reported fevers at home, but no nausea or vomiting. She had felt very weak.  In the ED, she was febrile with a temperature of 102.3. Her blood pressure was on the low side of normal. Her white blood cell count was 23.7. CT of her abdomen and pelvis revealed diffuse edematous wall thickening in the colon from the cecal tip to the rectum associated with diffuse pericolonic edema/inflammation; compatible with infectious/inflammatory pancolitis. She was admitted for further evaluation and management.  Assessment & Plan:   Principal Problem:   Pancolitis ([place]) Active Problems:   C. difficile diarrhea   C. difficile colitis   Acute kidney injury ([place])   Metabolic acidosis   Hypokalemia   Essential hypertension   Severe persistent asthma   Morbid obesity ([facility])   Hypotension   History of atrial fibrillation- NSR now   History of adrenal insufficiency   History of hypertension   Hypocalcemia   Hyponatremia   Pancolitis secondary to C. difficile colitis >>>?  Admitting physician, Dr. Lorrin Jackson discussed the patient with the on-call gastroenterologist. Per recommendation, she was started on IV Flagyl and oral vancomycin for presumed C. difficile colitis. Stool studies were ordered and the C. difficile PCR came back positive. GI pathogen panel came back negative. -Due to increased white blood cell count and  worsening abdominal pain, CT of the abdomen/pelvis was ordered on 7/22. It revealed worsening C. difficile pancolitis, concerning for toxic megacolon. Vancomycin enema was given to patient 1 per Dr. Marin Comment. Oral vancomycin increased to every 6 hours. -General surgeon, Dr. Arnoldo Morale has been consulted. GI has been consulted. ? About continuing vancomycin enemas. -Due to patient's low blood pressure, will transfer her to the ICU and start her on Neo-Synephrine drip.  Sepsis (not present on admission) secondary to C. difficile colitis. -Treatment as above.  Acute kidney injury, possibly from ATN/cortical necrosis. The patient's creatinine has progressively increased; high of 2.46, currently at 2.10. Her urine output has been oliguric, 500 cc or less. She was given several boluses of IV fluids on 11/01/15 with no significant increase in her urine output. Foley catheter started draining very dark Coca-[employer]a colored urine. -Renal ultrasound ordered, is pending. -Dr. Lowanda Foster was consulted and started IV albumin infusion, IV Lasix, and adjustments in her IV fluids. However, due to her low blood pressure, Lasix was withheld. We'll start Neo-Synephrine to improve her blood pressure and with improvement, will start IV Lasix. Patient's urine output appears to be oliguric. -We'll order total CK and urine myoglobin.  Hypokalemia. Patient's serum potassium was 2.1 on admission. Her magnesium level was 1.7. She was given several runs of IV potassium. Her serum potassium improved, but decreased again to 2.5, possibly from bicarbonate in IV fluids and diarrhea. She was given additional IV runs of potassium chloride with improvement. We'll continue to monitor. Hypomagnesemia. Magnesium was 1.1. She was given 2 g of magnesium sulfate. Her magnesium level improved to 2.1. Hypocalcemia/hypophosphatemia.  The patient's serum was 7.1 on admission. It has decreased to 6.3. She was started on calcium supplementation. -Her  albumin was only 1.9 which is contributing to hypocalcemia, but with correction, it was still below 8. -Her phosphorus is low at 1.5. -Nephrology was consulted.  Dr. Lowanda Foster   will besedtarted phosphorus supplementation and adjust IV fluids. He  ordered albumin infusions. -Her calcium is improving. We'll continue to monitor.  Metabolic acidosis. The patient's CO2(bicarbonate) progressively decreased to a nadir of 12. She was started on bicarbonate infusion. Her lactic acid level increased from 1.5-2.3. ABG on 7/22 revealed pH of 7.4, PCO2 of 24, and a PO2 of 82; indicating compensated alkalosis. -Her CO2 is improving. Continue bicarbonate infusion.  Adrenal insufficiency. The patient takes 15 mg of prednisone daily. On admission, she was given 125 mg of Solu-Medrol. She was subsequently started on Solu-Cortef IV every 8 hours. It was weaned down to every 12 hours. -We'll hold it there for now.  UTI with oliguria and large hemoglobin-in 6-20 RBCs. -Foley catheter started draining dark-colored urine. -Urinalysis on 7/22 was indicative of infection. Cipro was started 1 dose, but it was discontinued in light of worsening colitis. -We will order a total CK and urine myoglobin.  Peripheral edema. Patient is now developing peripheral edema, presumably from her low urine output and diarrhea.  20 mg was Lasix  given 1. Dr. Lowanda Foster ordered 80 mg of Lasix IV every 12 hours, but it has not been given yet due to hypotension.   Hypotension secondary to hypovolemia from diarrhea and adrenal insufficiency. Treating with IV steroid and volume repletion with IV fluids. Her blood pressure did improve, but is trending down; secondary to sepsis. -We will treat with Neo-Synephrine.  Chronic asthma. Currently stable. She was continued on Singulair, Spiriva and Pulmicort.      DVT prophylaxis: Subcutaneous heparin Code Status: Full code Family Communication: Family not available Disposition  Plan: Discharge when clinically appropriate, likely in a few days.   Consultants:   None  Procedures:   None  Antimicrobials:   Oral vancomycin 10/30/15  IV metronidazole 10/30/15    Subjective: Patient complains of diffuse abdominal pain and some abdominal bloating. She continues to have intermittent small amounts and large amounts of diarrhea; rectal tube was removed because it was leaking. She denies vomiting.   Objective: Vitals:   11/03/15 0600 11/03/15 0751 11/03/15 0752 11/03/15 0900  BP:    (!) 85/46  Pulse:  99    Resp:  18    Temp:      TempSrc:      SpO2:   96%   Weight: 126.1 kg (278 lb)     Height:        Intake/Output Summary (Last 24 hours) at 11/03/15 1015 Last data filed at 11/03/15 0500  Gross per 24 hour  Intake              480 ml  Output              426 ml  Net               54 ml   Filed Weights   11/01/15 0500 11/02/15 0500 11/03/15 0600  Weight: 114.8 kg (253 lb 1.6 oz) 114.5 kg (252 lb 7.5 oz) 126.1 kg (278 lb)    Examination:  General exam: Appears calm and comfortable; obese 55 year old African-American who appears ill.  Respiratory system: Clear to auscultation. Respiratory effort normal. Cardiovascular system: S1 & S2 heard,  RRR. No JVD, murmurs, rubs, gallops or clicks. 1-2+ bilateral lower extremity edema. Gastrointestinal system: Abdomen is obese and moderately diffusely tender; mildly to moderately distended No organomegaly or masses felt. Normal bowel sounds heard. GU: Indwelling Foley catheter draining dark cold cola-colored urine. Central nervous system: Alert and oriented. No focal neurological deficits. Extremities: Symmetric 5 x 5 power. Skin: No rashes, lesions or ulcers Psychiatry: Judgement and insight appear normal. Mood & affect appropriate.     Data Reviewed: I have personally reviewed following labs and imaging studies  CBC:  Recent Labs Lab 10/30/15 1057 10/31/15 0547 11/01/15 0500 11/02/15 0623  11/03/15 0806  WBC 23.7* 25.9* 22.7* 28.6* 32.2*  NEUTROABS 21.9*  --   --   --   --   HGB 10.8* 12.3 12.3 10.6* 12.5  HCT 32.6* 37.2 37.9 32.0* 36.9  MCV 85.6 84.9 85.9 86.0 82.6  PLT 493* 429* 420* 267 [id number]   Basic Metabolic Panel:  Recent Labs Lab 10/30/15 1057  11/01/15 0500 11/01/15 2038 11/02/15 0623 11/02/15 1245 11/02/15 2252 11/03/15 0328 11/03/15 0806  NA 135  < > 138 130*  --  135 130*  --  131*  K 2.1*  < > 4.4 4.3  --  2.5* 4.5  --  4.1  CL 100*  < > 110 108  --  119* 105  --  104  CO2 27  < > 17* 15*  --  12* 20*  --  19*  GLUCOSE 86  < > 110* 124*  --  107* 117*  --  119*  BUN 19  < > 30* 34*  --  27* 36*  --  37*  CREATININE 1.36*  < > 1.81* 2.65*  --  1.81* 2.46*  --  2.10*  CALCIUM 7.1*  < > 6.3* 6.0*  --  4.1* 6.5*  --  6.8*  MG 1.7  --   --   --   --  1.1*  --  2.1 2.1  PHOS  --   --   --   --  1.5*  --   --   --   --   < > = values in this interval not displayed. GFR: Estimated Creatinine Clearance: 37.6 mL/min (by C-G formula based on SCr of 2.1 mg/dL). Liver Function Tests:  Recent Labs Lab 10/30/15 1239 11/01/15 2038  AST 16 26  ALT 13* 14  ALKPHOS 88 79  BILITOT 1.1 0.4  PROT 4.8* 4.6*  ALBUMIN 2.1* 1.9*    Recent Labs Lab 10/30/15 1057  LIPASE 9*   No results for input(s): AMMONIA in the last 168 hours. Coagulation Profile: No results for input(s): INR, PROTIME in the last 168 hours. Cardiac Enzymes:  Recent Labs Lab 10/30/15 1239  TROPONINI <0.03   BNP (last 3 results) No results for input(s): PROBNP in the last 8760 hours. HbA1C: No results for input(s): HGBA1C in the last 72 hours. CBG: No results for input(s): GLUCAP in the last 168 hours. Lipid Profile: No results for input(s): CHOL, HDL, LDLCALC, TRIG, CHOLHDL, LDLDIRECT in the last 72 hours. Thyroid Function Tests: No results for input(s): TSH, T4TOTAL, FREET4, T3FREE, THYROIDAB in the last 72 hours. Anemia Panel: No results for input(s): VITAMINB12, FOLATE,  FERRITIN, TIBC, IRON, RETICCTPCT in the last 72 hours. Sepsis Labs:  Recent Labs Lab 10/30/15 1239 10/30/15 1525 11/02/15 1245 11/02/15 2252  LATICACIDVEN 1.8 1.5 2.3* 2.2*    Recent Results (from the past 240 hour(s))  Urine culture  Status: None   Collection Time: 10/30/15 12:38 PM  Result Value Ref Range Status   Specimen Description URINE, CATHETERIZED  Final   Special Requests NONE  Final   Culture NO GROWTH Performed at [facility]   Final   Report Status 10/31/2015 FINAL  Final  Culture, blood (routine x 2)     Status: None (Preliminary result)   Collection Time: 10/30/15 12:39 PM  Result Value Ref Range Status   Specimen Description BLOOD LEFT ANTECUBITAL  Final   Special Requests BOTTLES DRAWN AEROBIC AND ANAEROBIC 10CC EACH  Final   Culture NO GROWTH 4 DAYS  Final   Report Status PENDING  Incomplete  Culture, blood (routine x 2)     Status: None (Preliminary result)   Collection Time: 10/30/15 12:53 PM  Result Value Ref Range Status   Specimen Description BLOOD RIGHT ANTECUBITAL  Final   Special Requests BOTTLES DRAWN AEROBIC AND ANAEROBIC 6CC EACH  Final   Culture NO GROWTH 4 DAYS  Final   Report Status PENDING  Incomplete  Gastrointestinal Panel by PCR , Stool     Status: None   Collection Time: 10/31/15  2:21 AM  Result Value Ref Range Status   Campylobacter species NOT DETECTED NOT DETECTED Final   Plesimonas shigelloides NOT DETECTED NOT DETECTED Final   Salmonella species NOT DETECTED NOT DETECTED Final   Yersinia enterocolitica NOT DETECTED NOT DETECTED Final   Vibrio species NOT DETECTED NOT DETECTED Final   Vibrio cholerae NOT DETECTED NOT DETECTED Final   Enteroaggregative E coli (EAEC) NOT DETECTED NOT DETECTED Final   Enteropathogenic E coli (EPEC) NOT DETECTED NOT DETECTED Final   Enterotoxigenic E coli (ETEC) NOT DETECTED NOT DETECTED Final   Shiga like toxin producing E coli (STEC) NOT DETECTED NOT DETECTED Final   E. coli O157 NOT  DETECTED NOT DETECTED Final   Shigella/Enteroinvasive E coli (EIEC) NOT DETECTED NOT DETECTED Final   Cryptosporidium NOT DETECTED NOT DETECTED Final   Cyclospora cayetanensis NOT DETECTED NOT DETECTED Final   Entamoeba histolytica NOT DETECTED NOT DETECTED Final   Giardia lamblia NOT DETECTED NOT DETECTED Final   Adenovirus F40/41 NOT DETECTED NOT DETECTED Final   Astrovirus NOT DETECTED NOT DETECTED Final   Norovirus GI/GII NOT DETECTED NOT DETECTED Final   Rotavirus A NOT DETECTED NOT DETECTED Final   Sapovirus (I, II, IV, and V) NOT DETECTED NOT DETECTED Final  C difficile quick scan w PCR reflex     Status: Abnormal   Collection Time: 10/31/15  2:21 AM  Result Value Ref Range Status   C Diff antigen POSITIVE (A) NEGATIVE Final   C Diff toxin POSITIVE (A) NEGATIVE Final   C Diff interpretation Toxin producing C. difficile detected.  Final    Comment: CRITICAL RESULT CALLED TO, READ BACK BY AND VERIFIED WITH: Marylouise Stacks AT [id number] ON [id number] BY FORSYTH K   Stool culture (children & immunocomp patients)     Status: None (Preliminary result)   Collection Time: 10/31/15  2:21 AM  Result Value Ref Range Status   Salmonella/Shigella Screen Final report  Final    Comment: (NOTE) Performed At: [facility] [place], [place] [id number] Lindon Romp MD [id number]    Campylobacter Culture PENDING  Incomplete   E coli, Shiga toxin Assay Negative Negative Final    Comment: (NOTE) Performed At: [facility] [address] [place], [place] [id number] Lindon Romp MD [id number]   STOOL CULTURE REFLEX - [place]  Status: None   Collection Time: 10/31/15  2:21 AM  Result Value Ref Range Status   Stool Culture result 1 ([facility]ASHR) Comment  Final    Comment: (NOTE) No Salmonella or Shigella recovered. Performed At: [facility] [place], [place] [id number] Lindon Romp MD [id number]          Radiology Studies: Ct  Abdomen Pelvis Wo Contrast  Result Date: 11/02/2015 CLINICAL DATA:  C difficile colitis.  Abdominal pain EXAM: CT ABDOMEN AND PELVIS WITHOUT CONTRAST TECHNIQUE: Multidetector CT imaging of the abdomen and pelvis was performed following the standard protocol without IV contrast. COMPARISON:  10/30/2015 FINDINGS: Lower chest and abdominal wall: Small pericardial effusion. Small pleural effusions. Aortic atherosclerosis, notable for age. Hepatobiliary: No focal liver abnormality.Cholecystectomy. No suspected bile duct dilatation. Pancreas: Unremarkable. Spleen: Unremarkable. Adrenals/Urinary Tract: Negative adrenals. No hydronephrosis or stone. The bladder is decompressed by Foley catheter. Stomach/Bowel: Diffuse marked colonic wall thickening and mesocolonic fat edema has worsened. Inflammation is pancolonic. The colon is more distended than previously. No visible pneumatosis or perforation. No bowel obstruction. No focal pericecal inflammation. Status post gastric bypass. Reproductive:No pathologic findings. Vascular/Lymphatic: No acute vascular abnormality. No mass or adenopathy. Other: No pneumoperitoneum. Musculoskeletal: L4-5 discectomy. Interbody fusion is questionable but there is likely posterior-lateral solid bone. Remote L1 superior endplate fracture. Spondylosis. These results were called by telephone at the time of interpretation on 11/02/2015 at 9:17 pm to Dr Marin Comment, who verbally acknowledged these results. IMPRESSION: 1. Worsening C difficile pancolitis. The colon is more distended than previously, concerning for developing toxic megacolon. No perforation. 2. New small ascites and pleural effusions. Electronically Signed   By: Monte Fantasia M.D.   On: 11/02/2015 21:35        Scheduled Meds: . albumin human  25 g Intravenous BID  . budesonide  0.5 mg Nebulization BID  . citalopram  20 mg Oral QHS  . DULoxetine  30 mg Oral Daily  . furosemide  80 mg Intravenous BID  . gabapentin  600 mg Oral  TID  . heparin subcutaneous  5,000 Units Subcutaneous Q8H  . hydrocortisone sod succinate (SOLU-CORTEF) inj  25 mg Intravenous Q12H  . latanoprost  1 drop Both Eyes QHS  . loratadine  10 mg Oral Daily  . metronidazole  500 mg Intravenous Q8H  . montelukast  10 mg Oral QHS  . phosphorus  500 mg Oral BID  . tiotropium  1 capsule Inhalation Daily  . vancomycin  500 mg Oral Q6H  . vancomycin (VANCOCIN) rectal ENEMA  500 mg Rectal Q6H   Continuous Infusions: . dextrose 5 % with KCl 20 mEq / L    . phenylephrine (NEO-SYNEPHRINE) Adult infusion       LOS: 4 days    Time spent: 45 critical care minutes    Rexene Alberts, MD [facility] Pager [phone]  If 7PM-7AM, please contact night-coverage [email or web address] Password [facility] 11/03/2015, 10:15 AM

## 2015-11-03 NOTE — Progress Notes (Signed)
Subjective: Interval History: has complaints of diarrhea multiple times. Presently she claims there is no much improvement. She has also some abdominal pain but no nausea or vomiting. Presently she denies any difficulty breathing..  Objective: Vital signs in last 24 hours: Temp:  [97.8 F (36.6 C)-99.6 F (37.6 C)] 97.8 F (36.6 C) (07/23 0500) Pulse Rate:  [85-101] 99 (07/23 0751) Resp:  [16-20] 18 (07/23 0751) BP: (94-146)/(51-107) 110/71 (07/23 0500) SpO2:  [90 %-100 %] 96 % (07/23 0752) Weight:  [126.1 kg (278 lb)] 126.1 kg (278 lb) (07/23 0600) Weight change: 11.6 kg (25 lb 8.5 oz)  Intake/Output from previous day: 07/22 0701 - 07/23 0700 In: 720 [P.O.:720] Out: 427 [Urine:425; Stool:2] Intake/Output this shift: No intake/output data recorded.  General appearance: alert, cooperative and no distress Resp: clear to auscultation bilaterally Cardio: regular rate and rhythm, S1, S2 normal, no murmur, click, rub or gallop GI: Patient with some abdominal tenderness but no rebound tenderness. Positive bowel sounds Extremities: edema 1-2+ edema  Lab Results:  Recent Labs  11/01/15 0500 11/02/15 0623  WBC 22.7* 28.6*  HGB 12.3 10.6*  HCT 37.9 32.0*  PLT 420* 267   BMET:  Recent Labs  11/02/15 1245 11/02/15 2252  NA 135 130*  K 2.5* 4.5  CL 119* 105  CO2 12* 20*  GLUCOSE 107* 117*  BUN 27* 36*  CREATININE 1.81* 2.46*  CALCIUM 4.1* 6.5*   No results for input(s): PTH in the last 72 hours. Iron Studies: No results for input(s): IRON, TIBC, TRANSFERRIN, FERRITIN in the last 72 hours.  Studies/Results: Ct Abdomen Pelvis Wo Contrast  Result Date: 11/02/2015 CLINICAL DATA:  C difficile colitis.  Abdominal pain EXAM: CT ABDOMEN AND PELVIS WITHOUT CONTRAST TECHNIQUE: Multidetector CT imaging of the abdomen and pelvis was performed following the standard protocol without IV contrast. COMPARISON:  10/30/2015 FINDINGS: Lower chest and abdominal wall: Small pericardial  effusion. Small pleural effusions. Aortic atherosclerosis, notable for age. Hepatobiliary: No focal liver abnormality.Cholecystectomy. No suspected bile duct dilatation. Pancreas: Unremarkable. Spleen: Unremarkable. Adrenals/Urinary Tract: Negative adrenals. No hydronephrosis or stone. The bladder is decompressed by Foley catheter. Stomach/Bowel: Diffuse marked colonic wall thickening and mesocolonic fat edema has worsened. Inflammation is pancolonic. The colon is more distended than previously. No visible pneumatosis or perforation. No bowel obstruction. No focal pericecal inflammation. Status post gastric bypass. Reproductive:No pathologic findings. Vascular/Lymphatic: No acute vascular abnormality. No mass or adenopathy. Other: No pneumoperitoneum. Musculoskeletal: L4-5 discectomy. Interbody fusion is questionable but there is likely posterior-lateral solid bone. Remote L1 superior endplate fracture. Spondylosis. These results were called by telephone at the time of interpretation on 11/02/2015 at 9:17 pm to Dr Marin Comment, who verbally acknowledged these results. IMPRESSION: 1. Worsening C difficile pancolitis. The colon is more distended than previously, concerning for developing toxic megacolon. No perforation. 2. New small ascites and pleural effusions. Electronically Signed   By: Monte Fantasia M.D.   On: 11/02/2015 21:35    I have reviewed the patient's current medications.  Assessment/Plan: Problem #1 acute kidney injury: Presently her basic metabolic panel is pending. Patient with ATN/possible cortical necrosis. Her renal function showed some improvement today Problem #2 C. difficile colitis: Patient on vancomycin. Still her white blood cell count is high and patient continued to have diarrhea Problem #3 history of asthma: She is on inhalers Problem #4 low CO2: Blood gas from yesterday showed pH of 7.47 [place]. PCO2 was 24 and CO2 of 12. This seems to be secondary to respiratory alkalosis. Problem #5  hypophosphatemia: Presently patient is started on K-Phos Neutra Problem #6 hypokalemia: Patient to potassium supplement . Her potassium has corrected Problem #7 hypocalcemia on calcium supplement. Her calcium has improved. Plan: We'll DC sodium bicarbonate 2] we'll increase Lasix to 80 mg IV twice a day 3] we'll check compressive metabolic panel and  phosphorus in the morning    LOS: 4 days   Deborah Murray S 11/03/2015,8:34 AM

## 2015-11-03 NOTE — Consult Note (Signed)
Reason for Consult: Pancolitis, C difficile Referring Physician: Dr. Gabriel Earing is an 55 y.o. female.  HPI: Patient is a 55 year old black female status post back surgery at [facility] on 10/17/2015 who presented with worsening diarrhea. She was admitted to the hospital for further evaluation treatment and found to have C. difficile colitis. She underwent a CT scan of the abdomen yesterday which revealed pan colitis, worrisome for toxic megacolon. She states her abdominal pain has not worsened since yesterday. She does get nauseated easily with full liquid diet. She is having ongoing diarrhea and passing flatus. Her abdominal pain does not worsen with movement. She has multiple other medical problems including adrenal insufficiency, atrial fibrillation, COPD, and morbid obesity. She is about to be transferred to the stepdown unit for further monitoring.  Past Medical History:  Diagnosis Date  . Adrenal abnormality ([place])   . Adrenal insufficiency ([place]) 2013   Tx at [facility]  . Asthma   . Atrial fibrillation ([place])   . Chronic back pain   . COPD (chronic obstructive pulmonary disease) ([place])   . Headache   . Hyperlipidemia   . Hypertension   . Lumbar radiculopathy   . Syncope and collapse 05/11/11  adrenal insuffiency  treated at [facility]  . Thyroid disease   . Vocal cord dysfunction     Past Surgical History:  Procedure Laterality Date  . ABDOMINAL HYSTERECTOMY    . BACK SURGERY     disc   . CARDIAC CATHETERIZATION  2012 at [facility]  . CHOLECYSTECTOMY    . CHOLECYSTECTOMY, LAPAROSCOPIC    . ESOPHAGOGASTRODUODENOSCOPY N/A 09/17/2015   Procedure: ESOPHAGOGASTRODUODENOSCOPY (EGD);  Surgeon: Daneil Dolin, MD;  Location: [facility];  Service: Endoscopy;  Laterality: N/A;  4008  . eye surg for glaucoma    . gastic by-pass  05-11-11   Gastric by-pass in 2008  . right knee surg      Family History  Problem Relation Age of Onset  . Heart attack Mother   . Hypertension  Mother   . Heart attack Father   . Asthma Father   . Hyperlipidemia Father   . Hypertension Father   . Heart attack Sister   . Arrhythmia Sister   . Asthma Sister   . Hyperlipidemia Sister   . Hypertension Sister   . Asthma Brother   . Hypertension Brother     Social History:  reports that she has never smoked. She has never used smokeless tobacco. She reports that she does not drink alcohol or use drugs.  Allergies:  Allergies  Allergen Reactions  . Aspirin Shortness Of Breath and Palpitations  . Codeine Sulfate Shortness Of Breath and Palpitations  . Darvocet [Propoxyphene N-Acetaminophen] Shortness Of Breath and Palpitations  . Tramadol Shortness Of Breath  . Penicillins Other (See Comments)    Has patient had a PCN reaction causing immediate rash, facial/tongue/throat swelling, SOB or lightheadedness with hypotension: No Has patient had a PCN reaction causing severe rash involving mucus membranes or skin necrosis: No Has patient had a PCN reaction that required hospitalization No Has patient had a PCN reaction occurring within the last 10 years: Yes If all of the above answers are "NO", then may proceed with Cephalosporin use.     Medications:  Prior to Admission:  Prescriptions Prior to Admission  Medication Sig Dispense Refill Last Dose  . atorvastatin (LIPITOR) 20 MG tablet Take 20 mg by mouth every evening.    3 days at  Unknown time  . clindamycin (CLEOCIN T) 1 % external solution Apply 1 application topically every evening. Applies to underarms   Past Week at Unknown time  . fluticasone (FLOVENT HFA) 220 MCG/ACT inhaler Inhale 2 puffs into the lungs every 4 (four) hours.   Past Week at Unknown time  . ipratropium (ATROVENT) 0.02 % nebulizer solution Inhale 2 mLs into the lungs 4 (four) times daily.  0 Past Week at Unknown time  . latanoprost (XALATAN) 0.005 % ophthalmic solution Place 1 drop into both eyes at bedtime.   [id number] at Unknown time  . levocetirizine (XYZAL)  5 MG tablet Take 5 mg by mouth every evening.   3 days at Unknown time  . omalizumab (XOLAIR) 150 MG injection Inject 300 mg into the skin every 28 (twenty-eight) days.    Past Month at Unknown time  . albuterol (PROVENTIL HFA;VENTOLIN HFA) 108 (90 BASE) MCG/ACT inhaler Inhale 2 puffs into the lungs every 4 (four) hours as needed for wheezing.    unknown  . albuterol (PROVENTIL) (2.5 MG/3ML) 0.083% nebulizer solution Take 2.5 mg by nebulization every 6 (six) hours as needed for wheezing or shortness of breath.   unknown  . aluminum chloride (DRYSOL) 20 % external solution Apply 1 application topically at bedtime as needed (for sweating and applies under arms).    unknown  . calcium citrate (CALCITRATE - DOSED IN MG ELEMENTAL CALCIUM) 950 MG tablet Take 1 tablet by mouth daily.   3 days  . Cholecalciferol (VITAMIN D) 2000 UNITS tablet Take 2,000 Units by mouth daily.   3 days  . citalopram (CELEXA) 20 MG tablet Take 20 mg by mouth at bedtime.    3 days  . DULoxetine (CYMBALTA) 30 MG capsule Take 30 mg by mouth daily.   3 3 days  . EPINEPHrine (EPIPEN JR) 0.15 MG/0.3ML injection Inject 0.15 mg into the muscle Once PRN.   never  . fluticasone (FLONASE) 50 MCG/ACT nasal spray Place 2 sprays in each nostril daily as needed for allergies  5 unknown  . gabapentin (NEURONTIN) 300 MG capsule Take 900 mg by mouth 3 (three) times daily.    3 days  . HYDROcodone-acetaminophen (NORCO) 5-325 MG tablet Take 1-2 tablets by mouth every 4 (four) hours as needed for moderate pain or severe pain.    unknown  . lisinopril-hydrochlorothiazide (PRINZIDE,ZESTORETIC) 20-25 MG per tablet Take 1 tablet by mouth daily.   3 days  . metoprolol tartrate (LOPRESSOR) 25 MG tablet Take 12.5 mg by mouth 2 (two) times daily.  0 3 days at 800  . montelukast (SINGULAIR) 10 MG tablet Take 10 mg by mouth at bedtime.   3 days  . Multiple Vitamin (MULTIVITAMIN) capsule Take 1 capsule by mouth daily.   3 days  . nitroGLYCERIN (NITROSTAT) 0.4  MG SL tablet Place 0.4 mg under the tongue every 5 (five) minutes as needed for chest pain.   unknown  . ondansetron (ZOFRAN ODT) 8 MG disintegrating tablet Take 1 tablet (8 mg total) by mouth every 8 (eight) hours as needed for nausea or vomiting. (Patient not taking: Reported on 08/28/2015) 20 tablet 0 Unknown at Unknown time  . ondansetron (ZOFRAN) 4 MG tablet Take 1 tablet (4 mg total) by mouth every 8 (eight) hours as needed for nausea or vomiting. 30 tablet 1 unknown  . phentermine 15 MG capsule Take 15 mg by mouth as needed (severe nausea and vomitting).    unknown  . potassium chloride SA (K-DUR,KLOR-CON) 20 MEQ  tablet Take 20 mEq by mouth 2 (two) times daily.   3 days  . predniSONE (DELTASONE) 10 MG tablet Take 10 mg by mouth daily.   0 3 days  . promethazine (PHENERGAN) 25 MG tablet Take 25 mg by mouth every 6 (six) hours as needed for nausea or vomiting.    unknown  . rivaroxaban (XARELTO) 20 MG TABS tablet Take 1 tablet (20 mg total) by mouth daily with supper. 30 tablet 0 3 days at 2000  . tiotropium (SPIRIVA HANDIHALER) 18 MCG inhalation capsule Place 1 capsule into inhaler and inhale daily.   3 days  . VOLTAREN 1 % GEL Apply 2 g topically as needed (for OA pain).   3 unknown   Scheduled: . albumin human  25 g Intravenous BID  . budesonide  0.5 mg Nebulization BID  . citalopram  20 mg Oral QHS  . DULoxetine  30 mg Oral Daily  . furosemide  80 mg Intravenous BID  . gabapentin  600 mg Oral TID  . heparin subcutaneous  5,000 Units Subcutaneous Q8H  . hydrocortisone sod succinate (SOLU-CORTEF) inj  25 mg Intravenous Q12H  . latanoprost  1 drop Both Eyes QHS  . loratadine  10 mg Oral Daily  . metronidazole  500 mg Intravenous Q8H  . montelukast  10 mg Oral QHS  . phosphorus  500 mg Oral BID  . tiotropium  1 capsule Inhalation Daily  . vancomycin  500 mg Oral Q6H  . vancomycin (VANCOCIN) rectal ENEMA  500 mg Rectal Q6H    Results for orders placed or performed during the hospital  encounter of 10/30/15 (from the past 48 hour(s))  Comprehensive metabolic panel     Status: Abnormal   Collection Time: 11/01/15  8:38 PM  Result Value Ref Range   Sodium 130 (L) 135 - 145 mmol/L    Comment: DELTA CHECK NOTED   Potassium 4.3 3.5 - 5.1 mmol/L   Chloride 108 101 - 111 mmol/L   CO2 15 (L) 22 - 32 mmol/L   Glucose, Bld 124 (H) 65 - 99 mg/dL   BUN 34 (H) 6 - 20 mg/dL   Creatinine, Ser 2.65 (H) 0.44 - 1.00 mg/dL   Calcium 6.0 (LL) 8.9 - 10.3 mg/dL    Comment: CRITICAL RESULT CALLED TO, READ BACK BY AND VERIFIED WITH: [name],K ON 11/01/15 AT 2150 BY [name],C    Total Protein 4.6 (L) 6.5 - 8.1 g/dL   Albumin 1.9 (L) 3.5 - 5.0 g/dL   AST 26 15 - 41 U/L   ALT 14 14 - 54 U/L   Alkaline Phosphatase 79 38 - 126 U/L   Total Bilirubin 0.4 0.3 - 1.2 mg/dL   GFR calc non Af Amer 19 (L) >60 mL/min   GFR calc Af Amer 22 (L) >60 mL/min    Comment: (NOTE) The eGFR has been calculated using the CKD EPI equation. This calculation has not been validated in all clinical situations. eGFR's persistently <60 mL/min signify possible Chronic Kidney Disease.    Anion gap 7 5 - 15  Urinalysis, Routine w reflex microscopic (not at [facility])     Status: Abnormal   Collection Time: 11/02/15  5:25 AM  Result Value Ref Range   Color, Urine BROWN (A) YELLOW    Comment: BIOCHEMICALS MAY BE AFFECTED BY COLOR   APPearance CLOUDY (A) CLEAR   Specific Gravity, Urine 1.025 1.005 - 1.030   pH 5.0 5.0 - 8.0   Glucose, UA 100 (A) NEGATIVE  mg/dL   Hgb urine dipstick LARGE (A) NEGATIVE   Bilirubin Urine MODERATE (A) NEGATIVE   Ketones, ur 15 (A) NEGATIVE mg/dL   Protein, ur 100 (A) NEGATIVE mg/dL   Nitrite POSITIVE (A) NEGATIVE   Leukocytes, UA TRACE (A) NEGATIVE  Urine microscopic-add on     Status: Abnormal   Collection Time: 11/02/15  5:25 AM  Result Value Ref Range   Squamous Epithelial / LPF 0-5 (A) NONE SEEN   WBC, UA 6-30 0 - 5 WBC/hpf   RBC / HPF 6-30 0 - 5 RBC/hpf   Bacteria, UA MANY (A) NONE  SEEN   Casts HYALINE CASTS (A) NEGATIVE    Comment: GRANULAR CAST  CBC     Status: Abnormal   Collection Time: 11/02/15  6:23 AM  Result Value Ref Range   WBC 28.6 (H) 4.0 - 10.5 K/uL   RBC 3.72 (L) 3.87 - 5.11 MIL/uL   Hemoglobin 10.6 (L) 12.0 - 15.0 g/dL   HCT 32.0 (L) 36.0 - 46.0 %   MCV 86.0 78.0 - 100.0 fL   MCH 28.5 26.0 - 34.0 pg   MCHC 33.1 30.0 - 36.0 g/dL   RDW 16.2 (H) 11.5 - 15.5 %   Platelets 267 150 - 400 K/uL  Phosphorus     Status: Abnormal   Collection Time: 11/02/15  6:23 AM  Result Value Ref Range   Phosphorus 1.5 (L) 2.5 - 4.6 mg/dL  Magnesium     Status: Abnormal   Collection Time: 11/02/15 12:45 PM  Result Value Ref Range   Magnesium 1.1 (L) 1.7 - 2.4 mg/dL  Lactic acid, plasma     Status: Abnormal   Collection Time: 11/02/15 12:45 PM  Result Value Ref Range   Lactic Acid, Venous 2.3 (HH) 0.5 - 1.9 mmol/L    Comment: CRITICAL RESULT CALLED TO, READ BACK BY AND VERIFIED WITH: MOORE,A. AT 1332 ON 11/02/2015 BY AGUNDIZ,E.   Osmolality     Status: None   Collection Time: 11/02/15 12:45 PM  Result Value Ref Range   Osmolality 285 275 - 295 mOsm/kg    Comment: Performed at [facility] metabolic panel     Status: Abnormal   Collection Time: 11/02/15 12:45 PM  Result Value Ref Range   Sodium 135 135 - 145 mmol/L   Potassium 2.5 (LL) 3.5 - 5.1 mmol/L    Comment: CRITICAL RESULT CALLED TO, READ BACK BY AND VERIFIED WITH: MOORE,A. AT 1325 ON 11/02/2015 BY [email or web address].    Chloride 119 (H) 101 - 111 mmol/L   CO2 12 (L) 22 - 32 mmol/L   Glucose, Bld 107 (H) 65 - 99 mg/dL   BUN 27 (H) 6 - 20 mg/dL   Creatinine, Ser 1.81 (H) 0.44 - 1.00 mg/dL   Calcium 4.1 (LL) 8.9 - 10.3 mg/dL   GFR calc non Af Amer 30 (L) >60 mL/min   GFR calc Af Amer 35 (L) >60 mL/min    Comment: (NOTE) The eGFR has been calculated using the CKD EPI equation. This calculation has not been validated in all clinical situations. eGFR's persistently <60 mL/min signify possible  Chronic Kidney Disease.    Anion gap 4 (L) 5 - 15  Blood gas, arterial     Status: Abnormal   Collection Time: 11/02/15  1:50 PM  Result Value Ref Range   FIO2 0.21    pH, Arterial 7.471 (H) 7.350 - 7.450   pCO2 arterial 23.9 (L) 35.0 - 45.0 mmHg  pO2, Arterial 81.8 80.0 - 100.0 mmHg   Bicarbonate 20.6 20.0 - 24.0 mEq/L   TCO2 23.9 0 - 100 mmol/L   Acid-base deficit 5.8 (H) 0.0 - 2.0 mmol/L   O2 Saturation 96.4 %   Patient temperature 37.0    Collection site RIGHT RADIAL    Drawn by [id number]    Sample type ARTERIAL DRAW    Allens test (pass/fail) PASS PASS  Osmolality, urine     Status: None   Collection Time: 11/02/15  4:02 PM  Result Value Ref Range   Osmolality, Ur 390 300 - 900 mOsm/kg    Comment: Performed at [facility]  Na and K (sodium & potassium), rand urine     Status: None   Collection Time: 11/02/15  4:02 PM  Result Value Ref Range   Sodium, Ur <10 mmol/L   Potassium Urine 42 mmol/L  Creatinine, urine, random     Status: None   Collection Time: 11/02/15  4:02 PM  Result Value Ref Range   Creatinine, Urine 225.34 mg/dL  Chloride, urine, random     Status: None   Collection Time: 11/02/15  4:02 PM  Result Value Ref Range   Chloride Urine 45 mmol/L  Basic metabolic panel     Status: Abnormal   Collection Time: 11/02/15 10:52 PM  Result Value Ref Range   Sodium 130 (L) 135 - 145 mmol/L   Potassium 4.5 3.5 - 5.1 mmol/L    Comment: DELTA CHECK NOTED   Chloride 105 101 - 111 mmol/L   CO2 20 (L) 22 - 32 mmol/L   Glucose, Bld 117 (H) 65 - 99 mg/dL   BUN 36 (H) 6 - 20 mg/dL   Creatinine, Ser 2.46 (H) 0.44 - 1.00 mg/dL   Calcium 6.5 (L) 8.9 - 10.3 mg/dL    Comment: DELTA CHECK NOTED   GFR calc non Af Amer 21 (L) >60 mL/min   GFR calc Af Amer 24 (L) >60 mL/min    Comment: (NOTE) The eGFR has been calculated using the CKD EPI equation. This calculation has not been validated in all clinical situations. eGFR's persistently <60 mL/min signify possible  Chronic Kidney Disease.    Anion gap 5 5 - 15  Lactic acid, plasma     Status: Abnormal   Collection Time: 11/02/15 10:52 PM  Result Value Ref Range   Lactic Acid, Venous 2.2 (HH) 0.5 - 1.9 mmol/L    Comment: CRITICAL RESULT CALLED TO, READ BACK BY AND VERIFIED WITH:  JOHNSON,B @ 2247 ON 11/02/15 BY WOODIE,J   Magnesium     Status: None   Collection Time: 11/03/15  3:28 AM  Result Value Ref Range   Magnesium 2.1 1.7 - 2.4 mg/dL  CBC     Status: Abnormal   Collection Time: 11/03/15  8:06 AM  Result Value Ref Range   WBC 32.2 (H) 4.0 - 10.5 K/uL   RBC 4.47 3.87 - 5.11 MIL/uL   Hemoglobin 12.5 12.0 - 15.0 g/dL   HCT 36.9 36.0 - 46.0 %   MCV 82.6 78.0 - 100.0 fL   MCH 28.0 26.0 - 34.0 pg   MCHC 33.9 30.0 - 36.0 g/dL   RDW 16.0 (H) 11.5 - 15.5 %   Platelets 272 150 - 400 K/uL  Basic metabolic panel     Status: Abnormal   Collection Time: 11/03/15  8:06 AM  Result Value Ref Range   Sodium 131 (L) 135 - 145 mmol/L   Potassium 4.1 3.5 -  5.1 mmol/L   Chloride 104 101 - 111 mmol/L   CO2 19 (L) 22 - 32 mmol/L   Glucose, Bld 119 (H) 65 - 99 mg/dL   BUN 37 (H) 6 - 20 mg/dL   Creatinine, Ser 2.10 (H) 0.44 - 1.00 mg/dL   Calcium 6.8 (L) 8.9 - 10.3 mg/dL   GFR calc non Af Amer 25 (L) >60 mL/min   GFR calc Af Amer 29 (L) >60 mL/min    Comment: (NOTE) The eGFR has been calculated using the CKD EPI equation. This calculation has not been validated in all clinical situations. eGFR's persistently <60 mL/min signify possible Chronic Kidney Disease.    Anion gap 8 5 - 15  Magnesium     Status: None   Collection Time: 11/03/15  8:06 AM  Result Value Ref Range   Magnesium 2.1 1.7 - 2.4 mg/dL    Ct Abdomen Pelvis Wo Contrast  Result Date: 11/02/2015 CLINICAL DATA:  C difficile colitis.  Abdominal pain EXAM: CT ABDOMEN AND PELVIS WITHOUT CONTRAST TECHNIQUE: Multidetector CT imaging of the abdomen and pelvis was performed following the standard protocol without IV contrast. COMPARISON:   10/30/2015 FINDINGS: Lower chest and abdominal wall: Small pericardial effusion. Small pleural effusions. Aortic atherosclerosis, notable for age. Hepatobiliary: No focal liver abnormality.Cholecystectomy. No suspected bile duct dilatation. Pancreas: Unremarkable. Spleen: Unremarkable. Adrenals/Urinary Tract: Negative adrenals. No hydronephrosis or stone. The bladder is decompressed by Foley catheter. Stomach/Bowel: Diffuse marked colonic wall thickening and mesocolonic fat edema has worsened. Inflammation is pancolonic. The colon is more distended than previously. No visible pneumatosis or perforation. No bowel obstruction. No focal pericecal inflammation. Status post gastric bypass. Reproductive:No pathologic findings. Vascular/Lymphatic: No acute vascular abnormality. No mass or adenopathy. Other: No pneumoperitoneum. Musculoskeletal: L4-5 discectomy. Interbody fusion is questionable but there is likely posterior-lateral solid bone. Remote L1 superior endplate fracture. Spondylosis. These results were called by telephone at the time of interpretation on 11/02/2015 at 9:17 pm to Dr Marin Comment, who verbally acknowledged these results. IMPRESSION: 1. Worsening C difficile pancolitis. The colon is more distended than previously, concerning for developing toxic megacolon. No perforation. 2. New small ascites and pleural effusions. Electronically Signed   By: Monte Fantasia M.D.   On: 11/02/2015 21:35    ROS:  Pertinent items are noted in HPI.  Blood pressure (!) 85/46, pulse 99, temperature 97.8 F (36.6 C), temperature source Oral, resp. rate 18, height 5' 2"  (1.575 m), weight 126.1 kg (278 lb), SpO2 96 %. Physical Exam: Pleasant black female in no acute distress. Head is normocephalic, atraumatic. Neck is supple without lymphadenopathy or carotid bruits. I could not appreciate JVD due to body habitus. Lungs clear auscultation with decreased breath sounds bilaterally, but no wheezing or rales are noted. Heart  examination reveals a regular rate and rhythm without S3, S4, murmurs. The abdomen is rotund with some fullness noted in the upper abdomen, but no rigidity is noted. No point tenderness is noted. No hepatosplenomegaly is appreciated due to body habitus.  Assessment/Plan: Impression: Pancolitis secondary to Clostridium difficile colitis. Patient does not have a surgical abdomen at this time. Agree with transfer to stepdown unit. Awaiting further evaluation by GI. Patient may need stress dose steroids due to her history of adrenal insufficiency. Will follow with you.  Deborah Murray A 11/03/2015, 10:14 AM

## 2015-11-04 ENCOUNTER — Inpatient Hospital Stay (HOSPITAL_COMMUNITY): Payer: Medicare Other

## 2015-11-04 ENCOUNTER — Encounter (HOSPITAL_COMMUNITY): Payer: Self-pay | Admitting: Internal Medicine

## 2015-11-04 DIAGNOSIS — R110 Nausea: Secondary | ICD-10-CM

## 2015-11-04 DIAGNOSIS — Z8639 Personal history of other endocrine, nutritional and metabolic disease: Secondary | ICD-10-CM

## 2015-11-04 DIAGNOSIS — R509 Fever, unspecified: Secondary | ICD-10-CM

## 2015-11-04 DIAGNOSIS — K5100 Ulcerative (chronic) pancolitis without complications: Secondary | ICD-10-CM

## 2015-11-04 DIAGNOSIS — A047 Enterocolitis due to Clostridium difficile: Principal | ICD-10-CM

## 2015-11-04 DIAGNOSIS — A419 Sepsis, unspecified organism: Secondary | ICD-10-CM

## 2015-11-04 DIAGNOSIS — N179 Acute kidney failure, unspecified: Secondary | ICD-10-CM

## 2015-11-04 DIAGNOSIS — R1084 Generalized abdominal pain: Secondary | ICD-10-CM

## 2015-11-04 HISTORY — DX: Sepsis, unspecified organism: A41.9

## 2015-11-04 LAB — CULTURE, BLOOD (ROUTINE X 2)
CULTURE: NO GROWTH
CULTURE: NO GROWTH

## 2015-11-04 LAB — RENAL FUNCTION PANEL
ANION GAP: 8 (ref 5–15)
Albumin: 2.9 g/dL — ABNORMAL LOW (ref 3.5–5.0)
BUN: 37 mg/dL — ABNORMAL HIGH (ref 6–20)
CHLORIDE: 99 mmol/L — AB (ref 101–111)
CO2: 19 mmol/L — AB (ref 22–32)
Calcium: 7.9 mg/dL — ABNORMAL LOW (ref 8.9–10.3)
Creatinine, Ser: 1.93 mg/dL — ABNORMAL HIGH (ref 0.44–1.00)
GFR calc non Af Amer: 28 mL/min — ABNORMAL LOW (ref >60)
GFR, EST AFRICAN AMERICAN: 32 mL/min — AB (ref >60)
Glucose, Bld: 130 mg/dL — ABNORMAL HIGH (ref 65–99)
Phosphorus: 4.4 mg/dL (ref 2.5–4.6)
Potassium: 4.5 mmol/L (ref 3.5–5.1)
Sodium: 126 mmol/L — ABNORMAL LOW (ref 135–145)

## 2015-11-04 LAB — COMPREHENSIVE METABOLIC PANEL
ALBUMIN: 2.6 g/dL — AB (ref 3.5–5.0)
ALK PHOS: 62 U/L (ref 38–126)
ALT: 10 U/L — ABNORMAL LOW (ref 14–54)
ANION GAP: 9 (ref 5–15)
AST: 23 U/L (ref 15–41)
BUN: 38 mg/dL — AB (ref 6–20)
CALCIUM: 7.9 mg/dL — AB (ref 8.9–10.3)
CO2: 19 mmol/L — AB (ref 22–32)
Chloride: 102 mmol/L (ref 101–111)
Creatinine, Ser: 1.95 mg/dL — ABNORMAL HIGH (ref 0.44–1.00)
GFR calc Af Amer: 32 mL/min — ABNORMAL LOW (ref >60)
GFR calc non Af Amer: 27 mL/min — ABNORMAL LOW (ref >60)
GLUCOSE: 131 mg/dL — AB (ref 65–99)
POTASSIUM: 4.4 mmol/L (ref 3.5–5.1)
SODIUM: 130 mmol/L — AB (ref 135–145)
Total Bilirubin: 0.3 mg/dL (ref 0.3–1.2)
Total Protein: 4.3 g/dL — ABNORMAL LOW (ref 6.5–8.1)

## 2015-11-04 LAB — CBC
HEMATOCRIT: 32.2 % — AB (ref 36.0–46.0)
HEMOGLOBIN: 10.8 g/dL — AB (ref 12.0–15.0)
MCH: 28.1 pg (ref 26.0–34.0)
MCHC: 33.5 g/dL (ref 30.0–36.0)
MCV: 83.9 fL (ref 78.0–100.0)
Platelets: 235 10*3/uL (ref 150–400)
RBC: 3.84 MIL/uL — ABNORMAL LOW (ref 3.87–5.11)
RDW: 15.8 % — AB (ref 11.5–15.5)
WBC: 24.4 10*3/uL — ABNORMAL HIGH (ref 4.0–10.5)

## 2015-11-04 LAB — STOOL CULTURE REFLEX - RSASHR

## 2015-11-04 LAB — STOOL CULTURE: E coli, Shiga toxin Assay: NEGATIVE

## 2015-11-04 LAB — PHOSPHORUS: Phosphorus: 4.2 mg/dL (ref 2.5–4.6)

## 2015-11-04 LAB — MAGNESIUM: Magnesium: 2.1 mg/dL (ref 1.7–2.4)

## 2015-11-04 LAB — STOOL CULTURE REFLEX - CMPCXR

## 2015-11-04 MED ORDER — PHENYLEPHRINE HCL 10 MG/ML IJ SOLN
INTRAMUSCULAR | Status: AC
  Filled 2015-11-04: qty 1

## 2015-11-04 MED ORDER — FUROSEMIDE 10 MG/ML IJ SOLN
8.0000 mg/h | INTRAVENOUS | Status: DC
  Administered 2015-11-04 – 2015-11-05 (×2): 8 mg/h via INTRAVENOUS
  Filled 2015-11-04 (×6): qty 25

## 2015-11-04 NOTE — Progress Notes (Signed)
Subjective: Patient states her abdominal pain has slight wheeze. She is having multiple stones of diarrhea. She only has abdominal pain with cramping on occasion, resolved with passing flatus or having a bowel movement.  Objective: Vital signs in last 24 hours: Temp:  [97.1 F (36.2 C)-97.6 F (36.4 C)] 97.5 F (36.4 C) (07/24 0952) Pulse Rate:  [79-106] 94 (07/24 1145) Resp:  [15-28] 17 (07/24 1145) BP: (88-139)/(53-119) 131/91 (07/24 1145) SpO2:  [92 %-100 %] 98 % (07/24 1145) Weight:  [128.3 kg (282 lb 13.6 oz)] 128.3 kg (282 lb 13.6 oz) (07/24 0500) Last BM Date: 11/04/15  Intake/Output from previous day: 07/23 0701 - 07/24 0700 In: 2535.3 [P.O.:240; I.V.:1695.3; IV Piggyback:600] Out: 850 [Urine:850] Intake/Output this shift: Total I/O In: 725 [I.V.:625; IV Piggyback:100] Out: -   General appearance: alert, cooperative and no distress GI: Soft, less distended. No rigidity noted. Bowel sounds active.  Lab Results:   Recent Labs  11/03/15 0806 11/04/15 0439  WBC 32.2* 24.4*  HGB 12.5 10.8*  HCT 36.9 32.2*  PLT 272 235   BMET  Recent Labs  11/03/15 0806 11/04/15 0439  NA 131* 130*  K 4.1 4.4  CL 104 102  CO2 19* 19*  GLUCOSE 119* 131*  BUN 37* 38*  CREATININE 2.10* 1.95*  CALCIUM 6.8* 7.9*   PT/INR No results for input(s): LABPROT, INR in the last 72 hours.  Studies/Results: Ct Abdomen Pelvis Wo Contrast  Result Date: 11/02/2015 CLINICAL DATA:  C difficile colitis.  Abdominal pain EXAM: CT ABDOMEN AND PELVIS WITHOUT CONTRAST TECHNIQUE: Multidetector CT imaging of the abdomen and pelvis was performed following the standard protocol without IV contrast. COMPARISON:  10/30/2015 FINDINGS: Lower chest and abdominal wall: Small pericardial effusion. Small pleural effusions. Aortic atherosclerosis, notable for age. Hepatobiliary: No focal liver abnormality.Cholecystectomy. No suspected bile duct dilatation. Pancreas: Unremarkable. Spleen: Unremarkable.  Adrenals/Urinary Tract: Negative adrenals. No hydronephrosis or stone. The bladder is decompressed by Foley catheter. Stomach/Bowel: Diffuse marked colonic wall thickening and mesocolonic fat edema has worsened. Inflammation is pancolonic. The colon is more distended than previously. No visible pneumatosis or perforation. No bowel obstruction. No focal pericecal inflammation. Status post gastric bypass. Reproductive:No pathologic findings. Vascular/Lymphatic: No acute vascular abnormality. No mass or adenopathy. Other: No pneumoperitoneum. Musculoskeletal: L4-5 discectomy. Interbody fusion is questionable but there is likely posterior-lateral solid bone. Remote L1 superior endplate fracture. Spondylosis. These results were called by telephone at the time of interpretation on 11/02/2015 at 9:17 pm to Dr Marin Comment, who verbally acknowledged these results. IMPRESSION: 1. Worsening C difficile pancolitis. The colon is more distended than previously, concerning for developing toxic megacolon. No perforation. 2. New small ascites and pleural effusions. Electronically Signed   By: Monte Fantasia M.D.   On: 11/02/2015 21:35   US Renal  Result Date: 11/03/2015 CLINICAL DATA:  Acute kidney injury, UTI EXAM: RENAL / URINARY TRACT ULTRASOUND COMPLETE COMPARISON:  CT abdomen pelvis dated 11/02/2015 FINDINGS: Right Kidney: Length: 10.5 cm.  No mass or hydronephrosis. Left Kidney: Length: 10.3 cm.  No mass or hydronephrosis. Bladder: Poorly visualized/underdistended. IMPRESSION: No hydronephrosis. Electronically Signed   By: Julian Hy M.D.   On: 11/03/2015 11:58   Anti-infectives: Anti-infectives    Start     Dose/Rate Route Frequency Ordered Stop   11/03/15 0000  vancomycin (VANCOCIN) 50 mg/mL oral solution 500 mg     500 mg Oral Every 6 hours 11/02/15 2210     11/03/15 0000  vancomycin (VANCOCIN) 500 mg in sodium chloride irrigation 0.9 %  100 mL ENEMA  Status:  Discontinued     500 mg Rectal Every 6 hours 11/02/15  2210 11/03/15 1145   11/02/15 1145  ciprofloxacin (CIPRO) IVPB 400 mg  Status:  Discontinued     400 mg 200 mL/hr over 60 Minutes Intravenous Every 24 hours 11/02/15 1137 11/02/15 2203   10/31/15 0000  vancomycin (VANCOCIN) 50 mg/mL oral solution 250 mg  Status:  Discontinued     250 mg Oral Every 6 hours 10/30/15 2048 11/02/15 2210   10/30/15 2200  metroNIDAZOLE (FLAGYL) IVPB 500 mg     500 mg 100 mL/hr over 60 Minutes Intravenous Every 8 hours 10/30/15 2048     10/30/15 1745  ciprofloxacin (CIPRO) IVPB 400 mg  Status:  Discontinued     400 mg 200 mL/hr over 60 Minutes Intravenous  Once 10/30/15 1739 10/30/15 1740   10/30/15 1745  metroNIDAZOLE (FLAGYL) IVPB 500 mg  Status:  Discontinued     500 mg 100 mL/hr over 60 Minutes Intravenous  Once 10/30/15 1739 10/30/15 1740   10/30/15 1300  vancomycin (VANCOCIN) 2,000 mg in sodium chloride 0.9 % 500 mL IVPB     2,000 mg 250 mL/hr over 120 Minutes Intravenous  Once 10/30/15 1254 10/30/15 1815   10/30/15 1245  levofloxacin (LEVAQUIN) IVPB 750 mg     750 mg 100 mL/hr over 90 Minutes Intravenous  Once 10/30/15 1238 10/30/15 1515   10/30/15 1245  aztreonam (AZACTAM) 2 g in dextrose 5 % 50 mL IVPB     2 g 100 mL/hr over 30 Minutes Intravenous  Once 10/30/15 1238 10/30/15 1546   10/30/15 1245  vancomycin (VANCOCIN) IVPB 1000 mg/200 mL premix  Status:  Discontinued     1,000 mg 200 mL/hr over 60 Minutes Intravenous  Once 10/30/15 1238 10/30/15 1254      Assessment/Plan: Impression: Pancolitis secondary to Clostridium difficile colitis. Patient is somewhat improved since yesterday. Her steroids have been increased and she does have a decreasing leukocytosis. No need for acute surgical intervention at this time. Will follow peripherally with you.  LOS: 5 days    Marcos Peloso A 11/04/2015

## 2015-11-04 NOTE — Progress Notes (Signed)
Subjective: Interval History: Patient presently offers no complaints. She doesn't have any nausea or vomiting. Denies also any difficulty in breathing.  Objective: Vital signs in last 24 hours: Temp:  [97.1 F (36.2 C)-97.6 F (36.4 C)] 97.4 F (36.3 C) (07/24 0400) Pulse Rate:  [81-107] 88 (07/24 0900) Resp:  [16-28] 16 (07/24 0900) BP: (88-138)/(53-119) 118/81 (07/24 0900) SpO2:  [92 %-100 %] 100 % (07/24 0900) Weight:  [128.3 kg (282 lb 13.6 oz)] 128.3 kg (282 lb 13.6 oz) (07/24 0500) Weight change: 2.2 kg (4 lb 13.6 oz)  Intake/Output from previous day: 07/23 0701 - 07/24 0700 In: 2535.3 [P.O.:240; I.V.:1695.3; IV Piggyback:600] Out: 850 [Urine:850] Intake/Output this shift: No intake/output data recorded.  General appearance: alert, cooperative and no distress Resp: clear to auscultation bilaterally Cardio: regular rate and rhythm, S1, S2 normal, no murmur, click, rub or gallop GI: Patient with some abdominal tenderness but no rebound tenderness. Positive bowel sounds Extremities: edema 1-2+ edema  Lab Results:  Recent Labs  11/03/15 0806 11/04/15 0439  WBC 32.2* 24.4*  HGB 12.5 10.8*  HCT 36.9 32.2*  PLT 272 235   BMET:   Recent Labs  11/03/15 0806 11/04/15 0439  NA 131* 130*  K 4.1 4.4  CL 104 102  CO2 19* 19*  GLUCOSE 119* 131*  BUN 37* 38*  CREATININE 2.10* 1.95*  CALCIUM 6.8* 7.9*   No results for input(s): PTH in the last 72 hours. Iron Studies: No results for input(s): IRON, TIBC, TRANSFERRIN, FERRITIN in the last 72 hours.  Studies/Results: Ct Abdomen Pelvis Wo Contrast  Result Date: 11/02/2015 CLINICAL DATA:  C difficile colitis.  Abdominal pain EXAM: CT ABDOMEN AND PELVIS WITHOUT CONTRAST TECHNIQUE: Multidetector CT imaging of the abdomen and pelvis was performed following the standard protocol without IV contrast. COMPARISON:  10/30/2015 FINDINGS: Lower chest and abdominal wall: Small pericardial effusion. Small pleural effusions. Aortic  atherosclerosis, notable for age. Hepatobiliary: No focal liver abnormality.Cholecystectomy. No suspected bile duct dilatation. Pancreas: Unremarkable. Spleen: Unremarkable. Adrenals/Urinary Tract: Negative adrenals. No hydronephrosis or stone. The bladder is decompressed by Foley catheter. Stomach/Bowel: Diffuse marked colonic wall thickening and mesocolonic fat edema has worsened. Inflammation is pancolonic. The colon is more distended than previously. No visible pneumatosis or perforation. No bowel obstruction. No focal pericecal inflammation. Status post gastric bypass. Reproductive:No pathologic findings. Vascular/Lymphatic: No acute vascular abnormality. No mass or adenopathy. Other: No pneumoperitoneum. Musculoskeletal: L4-5 discectomy. Interbody fusion is questionable but there is likely posterior-lateral solid bone. Remote L1 superior endplate fracture. Spondylosis. These results were called by telephone at the time of interpretation on 11/02/2015 at 9:17 pm to Dr Marin Comment, who verbally acknowledged these results. IMPRESSION: 1. Worsening C difficile pancolitis. The colon is more distended than previously, concerning for developing toxic megacolon. No perforation. 2. New small ascites and pleural effusions. Electronically Signed   By: Monte Fantasia M.D.   On: 11/02/2015 21:35   US Renal  Result Date: 11/03/2015 CLINICAL DATA:  Acute kidney injury, UTI EXAM: RENAL / URINARY TRACT ULTRASOUND COMPLETE COMPARISON:  CT abdomen pelvis dated 11/02/2015 FINDINGS: Right Kidney: Length: 10.5 cm.  No mass or hydronephrosis. Left Kidney: Length: 10.3 cm.  No mass or hydronephrosis. Bladder: Poorly visualized/underdistended. IMPRESSION: No hydronephrosis. Electronically Signed   By: Julian Hy M.D.   On: 11/03/2015 11:58   I have reviewed the patient's current medications.  Assessment/Plan: Problem #1 acute kidney injury: Patient is non-oliguric. Her renal function continued to improve. Problem #2 C.  difficile colitis: Patient continued to  have diarrhea. Presently followed by GI. Problem #3 history of asthma: She is on inhalers Problem #4 low CO2: Respiratory alkalosis. Her CO2 is stable. Problem #5 hypophosphatemia: Her phosphorus has corrected. Presently she is on Neutra-Phos. Problem #6 hypokalemia: Patient to potassium supplement . Her potassium is normal. Problem #7 hypocalcemia on calcium supplement. Her calcium is normal. Problem #8 anasarca: Presently she is on albumin and Lasix. She had only 800 mL of urine output. Most likely the Lasix was discontinued yesterday because of hypotension. Presently she was given 1 dose. Patient has gained about 24 kg since her admission because of IV fluid being administered at that for hypotension. Problem #9 hypotension: Presently she is on Neo-Synephrine and her blood pressure is high Plan:  2] we'll change Lasix to 8 mg per hour continuous drip. 3] we'll continue his albumin for another day 4] we'll DC K-Phos Neutra 5] we'll check her renal panel in the morning   LOS: 5 days   Deborah Murray S 11/04/2015,9:35 AM

## 2015-11-04 NOTE — Care Management Important Message (Signed)
Important Message  Patient Details  Name: Deborah Murray MRN: [id number] Date of Birth: Dec 24, 1960   Medicare Important Message Given:  Yes    Sherald Barge, RN 11/04/2015, 11:45 AM

## 2015-11-04 NOTE — Progress Notes (Signed)
Subjective: Continued nausea, no vomiting. Continued diarrhea, no hematochezia or melena per patient and nursing staff. Continued bilateral LE edema. Abdominal pain is generalized, sharp. No other upper or lower GI complaints.  Objective: Vital signs in last 24 hours: Temp:  [97.1 F (36.2 C)-97.6 F (36.4 C)] 97.5 F (36.4 C) (07/24 0952) Pulse Rate:  [81-107] 85 (07/24 0945) Resp:  [16-28] 16 (07/24 0945) BP: (88-138)/(53-119) 129/98 (07/24 0945) SpO2:  [92 %-100 %] 98 % (07/24 0945) Weight:  [282 lb 13.6 oz (128.3 kg)] 282 lb 13.6 oz (128.3 kg) (07/24 0500) Last BM Date: 11/04/15 General:   Morbidly obese female alert and oriented, pleasant Eyes:  No icterus, sclera clear. Conjuctiva pink.  Heart:  S1, S2 present, no murmurs noted.  Lungs: Clear to auscultation bilaterally, without wheezing, rales, or rhonchi.  Abdomen:  Bowel sounds present, obese but soft, non-distended. Generalized abdominal tenderness worse with palpation. No rebound or guarding. Msk:  Symmetrical without gross deformities. Extremities:  Moderate bilateral LE edema, 2-3 plus pitting. Neurologic:  Alert and  oriented x4;  grossly normal neurologically. Psych:  Alert and cooperative. Normal mood and affect.  Intake/Output from previous day: 07/23 0701 - 07/24 0700 In: 2535.3 [P.O.:240; I.V.:1695.3; IV Piggyback:600] Out: 850 [Urine:850] Intake/Output this shift: No intake/output data recorded.  Lab Results:  Recent Labs  11/02/15 0623 11/03/15 0806 11/04/15 0439  WBC 28.6* 32.2* 24.4*  HGB 10.6* 12.5 10.8*  HCT 32.0* 36.9 32.2*  PLT 267 272 235   BMET  Recent Labs  11/02/15 2252 11/03/15 0806 11/04/15 0439  NA 130* 131* 130*  K 4.5 4.1 4.4  CL 105 104 102  CO2 20* 19* 19*  GLUCOSE 117* 119* 131*  BUN 36* 37* 38*  CREATININE 2.46* 2.10* 1.95*  CALCIUM 6.5* 6.8* 7.9*   LFT  Recent Labs  11/01/15 2038 11/04/15 0439  PROT 4.6* 4.3*  ALBUMIN 1.9* 2.6*  AST 26 23  ALT 14 10*   ALKPHOS 79 62  BILITOT 0.4 0.3   PT/INR No results for input(s): LABPROT, INR in the last 72 hours. Hepatitis Panel No results for input(s): HEPBSAG, HCVAB, HEPAIGM, HEPBIGM in the last 72 hours.   Studies/Results: Ct Abdomen Pelvis Wo Contrast  Result Date: 11/02/2015 CLINICAL DATA:  C difficile colitis.  Abdominal pain EXAM: CT ABDOMEN AND PELVIS WITHOUT CONTRAST TECHNIQUE: Multidetector CT imaging of the abdomen and pelvis was performed following the standard protocol without IV contrast. COMPARISON:  10/30/2015 FINDINGS: Lower chest and abdominal wall: Small pericardial effusion. Small pleural effusions. Aortic atherosclerosis, notable for age. Hepatobiliary: No focal liver abnormality.Cholecystectomy. No suspected bile duct dilatation. Pancreas: Unremarkable. Spleen: Unremarkable. Adrenals/Urinary Tract: Negative adrenals. No hydronephrosis or stone. The bladder is decompressed by Foley catheter. Stomach/Bowel: Diffuse marked colonic wall thickening and mesocolonic fat edema has worsened. Inflammation is pancolonic. The colon is more distended than previously. No visible pneumatosis or perforation. No bowel obstruction. No focal pericecal inflammation. Status post gastric bypass. Reproductive:No pathologic findings. Vascular/Lymphatic: No acute vascular abnormality. No mass or adenopathy. Other: No pneumoperitoneum. Musculoskeletal: L4-5 discectomy. Interbody fusion is questionable but there is likely posterior-lateral solid bone. Remote L1 superior endplate fracture. Spondylosis. These results were called by telephone at the time of interpretation on 11/02/2015 at 9:17 pm to Dr Marin Comment, who verbally acknowledged these results. IMPRESSION: 1. Worsening C difficile pancolitis. The colon is more distended than previously, concerning for developing toxic megacolon. No perforation. 2. New small ascites and pleural effusions. Electronically Signed   By: Angelica Chessman  Watts M.D.   On: 11/02/2015 21:35   US  Renal  Result Date: 11/03/2015 CLINICAL DATA:  Acute kidney injury, UTI EXAM: RENAL / URINARY TRACT ULTRASOUND COMPLETE COMPARISON:  CT abdomen pelvis dated 11/02/2015 FINDINGS: Right Kidney: Length: 10.5 cm.  No mass or hydronephrosis. Left Kidney: Length: 10.3 cm.  No mass or hydronephrosis. Bladder: Poorly visualized/underdistended. IMPRESSION: No hydronephrosis. Electronically Signed   By: Julian Hy M.D.   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 Patient is at risk for toxic megacolon but I do not believe she has megacolon on comparing ET from 10/30/2015 and from yesterday.  Ascites is secondary to to C. difficile colitis and hyperalbuminemia and not necessarily indicated above and other diagnosis.  History of adrenal insufficiency. Patient is on IV hydrocortisone at a dose of 25 mg twice daily which may be insufficient given her acute illness.  Acute renal injury secondary to intravascular volume depletion/dehydration. Renal function has improved in the last 24 hours. Dr. Rhona Leavens help appreciated.  Vanco continued yesterday at 500 mg q 6 hours, vanco enemas stopped. Hydrocortisone continued, continue NPO, orders for NGT if vomiting. Abdominal film ordered for today   Today her labs show WBC decreased from 32 to 24. Cr stable and slightly improved, LFTs normal. Per nursing staff patient with decreased urine output and placed on Lasix gtt per Nephrology; continued IV albumin.   Today she is stable if no somewhat improved. Continues on antibiotics, Nephrology treating kidney dysfunction, now on IV lasix gtt. Continued edema. Continued abdominal pain and nausea. Leucocytosis improved. Pain improved with pain medication.   Plan: 1. Plan abdominal film this morning per previous plan 2. Continued pain and nausea management 3. Continued Vancomycin 4. Remain NPO for now 5. Notify of  any severely worsening symptoms, frank GI bleed.   Walden Field, AGNP-C Adult & Gerontological Nurse Practitioner [facility]     LOS: 5 days    11/04/2015, 10:17 AM

## 2015-11-04 NOTE — Progress Notes (Signed)
PROGRESS NOTE    Deborah Murray  [id number] DOB: 1960-11-16 DOA: 10/30/2015 PCP: Sol Passer, MD    Brief Narrative:  Patient is a 55 y.o. female with history of atrial fibrillation, HTN severe asthma/allergies, adrenal insufficiency, and recent back surgery, who presented to the ED on 10/30/2015 with a chief complaint of abdominal pain and diarrhea. She was discharged from the hospital from back surgery approximately 2 weeks ago. When she got home, she started having loose stools. She does not recall being treated with an antibiotic during the hospitalization for back surgery. She reported fevers at home, but no nausea or vomiting. She had felt very weak.  In the ED, she was febrile with a temperature of 102.3. Her blood pressure was on the low side of normal. Her white blood cell count was 23.7. CT of her abdomen and pelvis revealed diffuse edematous wall thickening in the colon from the cecal tip to the rectum associated with diffuse pericolonic edema/inflammation; compatible with infectious/inflammatory pancolitis. C. diff toxin PCR was positive. GI pathogen panel was negative. Follow-up CT of the abdomen and pelvis revealed worsening pancolitis. Surgery and GI were consulted. Nephrology consulted for acute renal failure.  Assessment & Plan:   Principal Problem:   Pancolitis ([place]) Active Problems:   C. difficile diarrhea   C. difficile colitis   Acute kidney injury ([place])   Metabolic acidosis   Hypokalemia   Essential hypertension   Severe persistent asthma   Morbid obesity ([facility])   Hypotension   History of atrial fibrillation- NSR now   History of adrenal insufficiency   History of hypertension   Hypocalcemia   Hyponatremia   Pancolitis secondary to C. difficile colitis >>>? Megacolon Admitting physician, Dr. Lorrin Jackson discussed the patient with the on-call gastroenterologist. Per recommendation, she was started on IV Flagyl and oral vancomycin for presumed C. difficile  colitis. Stool studies were ordered and the C. difficile PCR came back positive. GI pathogen panel came back negative. -Due to increased white blood cell count and worsening abdominal pain, follow-up CT of the abdomen/pelvis was ordered on 7/22. It revealed worsening C. difficile pancolitis, concerning for toxic megacolon. Vancomycin enema was given to patient 1 per Dr. Marin Comment. Oral vancomycin increased to every 6 hours. -General surgeon, Dr. Arnoldo Morale has been consulted. He did not believe she had a surgical abdomen. -GI, Dr. Laural Golden was consulted. GI has been consulted. He agreed with increasing the oral vancomycin and continuing IV Flagyl. He discontinued the vancomycin enemas. He increase the hydrocortisone to 50 mg IV every 8 hours. -Patient's white blood cell count is trending down. We'll continue to monitor  Hypotension secondary to Sepsis/initially volume depletion/adrenal insufficiency.  (sepsis not present on admission). Patient was started on vigorous IV fluids for diarrhea. Stress dose Solumedrol at 125 mg was given 1, then she was placed on every 8 hour IV hydrocortisone. Due to decreasing blood pressures and to start of Lasix, she was transferred to the ICU for closer management. IV Neo-Synephrine was ordered. -Blood cultures are negative to date. Urine culture is negative today. -We'll continue IV fluids and pressors and supportive treatment.  Acute kidney injury, possibly from ATN/cortical necrosis. The patient's creatinine has progressively increased; high of 2.46, currently at 2.10. Her urine output had been oliguric, 500 cc or less. She was given several boluses of IV fluids on 11/01/15 with no significant increase in her urine output. Foley catheter started draining very dark Coca[employer]a colored urine. -Renal ultrasound was unremarkable. -Dr. Lowanda Foster was consulted  and started IV albumin infusion, IV Lasix, and adjustments in her IV fluids. However, due to her low blood pressure, Lasix was  withheld. After Neo-Synephrine was started, IV Lasix was given. -CK ordered and was within normal limits. Urine myoglobin is pending. -Her urine output appears to be picking up. Her creatinine shows slight improvement to 1.95.  Hypokalemia. Patient's serum potassium was 2.1 on admission. Her magnesium level was 1.7. She was given several runs of IV potassium. Her serum potassium improved, but decreased again to 2.5, possibly from bicarbonate in IV fluids and diarrhea. With additional runs, her serum potassium has improved. Hypomagnesemia. Magnesium was 1.1. She was given 2 g of magnesium sulfate. Her magnesium level improved to 2.1. Hypocalcemia/hypophosphatemia. The patient's serum was 7.1 on admission. It has decreased to 6.3. She was started on calcium supplementation. -Her albumin was only 1.9 which contributed to hypocalcemia. Calcium supplementation was started. Her calcium was also low. Dr. Lowanda Foster started phosphorus supplementation. As above, albumin infusions were started. -Her calcium and phosphorus have improved. Her albumin has improved.  Metabolic acidosis. The patient's CO2(bicarbonate) progressively decreased to a nadir of 12. She was started on bicarbonate infusion. Her lactic acid level increased from 1.5-2.3. ABG on 7/22 revealed pH of 7.4, PCO2 of 24, and a PO2 of 82; indicating compensated alkalosis. -Her CO2 is improving, but is still below normal range. Continue bicarbonate infusion.  Adrenal insufficiency. The patient takes 15 mg of prednisone daily. On admission, she was given 125 mg of Solu-Medrol. She was subsequently started on Solu-Cortef IV every 8 hours. It was weaned down to every 12 hours. -Due to decreased blood pressures, site Cortef was titrated back up to 50 mg every 8 hours.  UTI with oliguria and large hemoglobin-in 6-20 RBCs. -Foley catheter started draining dark-colored urine. -Urinalysis on 7/22 was indicative of infection. Cipro was started 1  dose, but it was discontinued in light of worsening colitis. Urine culture is negative today. Urine myoglobin is pending.  Peripheral edema. Patient is now developing peripheral edema, presumably from her low urine output and diarrhea.  20 mg was Lasix  given 1. Dr. Lowanda Foster ordered 80 mg of Lasix IV every 12 hours, but it was initially held due to hypotension.   Chronic asthma. Currently stable. She was continued on Singulair, Spiriva and Pulmicort.      DVT prophylaxis: Subcutaneous heparin Code Status: Full code Family Communication: Family not available Disposition Plan: Discharge when clinically appropriate.   Consultants:   None  Procedures:   None  Antimicrobials:   Oral vancomycin 10/30/15  IV metronidazole 10/30/15    Subjective: Patient believes she is feeling a little better, but continues to have a very poor appetite. She has mild nausea but no vomiting. She continues to have abdominal pain, at his eased to with IV hydromorphone.  Objective: Vitals:   11/04/15 0743 11/04/15 0745 11/04/15 0830 11/04/15 0845  BP:  (!) 123/94 98/68 103/80  Pulse:  82 83 81  Resp:  18 20 18   Temp:      TempSrc:      SpO2: 100% 100% 92% 99%  Weight:      Height:        Intake/Output Summary (Last 24 hours) at 11/04/15 0920 Last data filed at 11/04/15 0500  Gross per 24 hour  Intake          2535.33 ml  Output              850 ml  Net  1685.33 ml   Filed Weights   11/02/15 0500 11/03/15 0600 11/04/15 0500  Weight: 114.5 kg (252 lb 7.5 oz) 126.1 kg (278 lb) 128.3 kg (282 lb 13.6 oz)    Examination:  General exam: Appears calm and comfortable; obese 55 year old African-American who appears In no acute distress.  Respiratory system: Clear to auscultation. Respiratory effort normal. Cardiovascular system: S1 & S2 heard, RRR. No JVD, murmurs, rubs, gallops or clicks. 1-2+ bilateral lower extremity edema. Gastrointestinal system: Abdomen is obese and  moderately diffusely tender; mildly to moderately distended No organomegaly or masses felt. Normal bowel sounds heard. Rectal tube with watery light brown stool. GU: Indwelling Foley catheter draining dark cold cola-colored urine. Central nervous system: Alert and oriented. No focal neurological deficits. Extremities: Symmetric 5 x 5 power. Skin: No rashes, lesions or ulcers Psychiatry: Judgement and insight appear normal. Mood & affect appropriate.     Data Reviewed: I have personally reviewed following labs and imaging studies  CBC:  Recent Labs Lab 10/30/15 1057 10/31/15 0547 11/01/15 0500 11/02/15 0623 11/03/15 0806 11/04/15 0439  WBC 23.7* 25.9* 22.7* 28.6* 32.2* 24.4*  NEUTROABS 21.9*  --   --   --   --   --   HGB 10.8* 12.3 12.3 10.6* 12.5 10.8*  HCT 32.6* 37.2 37.9 32.0* 36.9 32.2*  MCV 85.6 84.9 85.9 86.0 82.6 83.9  PLT 493* 429* 420* 267 272 [id number]   Basic Metabolic Panel:  Recent Labs Lab 10/30/15 1057  11/01/15 2038 11/02/15 0623 11/02/15 1245 11/02/15 2252 11/03/15 0328 11/03/15 0806 11/04/15 0439  NA 135  < > 130*  --  135 130*  --  131* 130*  K 2.1*  < > 4.3  --  2.5* 4.5  --  4.1 4.4  CL 100*  < > 108  --  119* 105  --  104 102  CO2 27  < > 15*  --  12* 20*  --  19* 19*  GLUCOSE 86  < > 124*  --  107* 117*  --  119* 131*  BUN 19  < > 34*  --  27* 36*  --  37* 38*  CREATININE 1.36*  < > 2.65*  --  1.81* 2.46*  --  2.10* 1.95*  CALCIUM 7.1*  < > 6.0*  --  4.1* 6.5*  --  6.8* 7.9*  MG 1.7  --   --   --  1.1*  --  2.1 2.1 2.1  PHOS  --   --   --  1.5*  --   --   --   --  4.2  < > = values in this interval not displayed. GFR: Estimated Creatinine Clearance: 40.9 mL/min (by C-G formula based on SCr of 1.95 mg/dL). Liver Function Tests:  Recent Labs Lab 10/30/15 1239 11/01/15 2038 11/04/15 0439  AST 16 26 23   ALT 13* 14 10*  ALKPHOS 88 79 62  BILITOT 1.1 0.4 0.3  PROT 4.8* 4.6* 4.3*  ALBUMIN 2.1* 1.9* 2.6*    Recent Labs Lab 10/30/15 1057    LIPASE 9*   No results for input(s): AMMONIA in the last 168 hours. Coagulation Profile: No results for input(s): INR, PROTIME in the last 168 hours. Cardiac Enzymes:  Recent Labs Lab 10/30/15 1239 11/03/15 0957  CKTOTAL  --  36*  TROPONINI <0.03  --    BNP (last 3 results) No results for input(s): PROBNP in the last 8760 hours. HbA1C: No results for input(s): HGBA1C in the  last 72 hours. CBG: No results for input(s): GLUCAP in the last 168 hours. Lipid Profile: No results for input(s): CHOL, HDL, LDLCALC, TRIG, CHOLHDL, LDLDIRECT in the last 72 hours. Thyroid Function Tests: No results for input(s): TSH, T4TOTAL, FREET4, T3FREE, THYROIDAB in the last 72 hours. Anemia Panel: No results for input(s): VITAMINB12, FOLATE, FERRITIN, TIBC, IRON, RETICCTPCT in the last 72 hours. Sepsis Labs:  Recent Labs Lab 10/30/15 1239 10/30/15 1525 11/02/15 1245 11/02/15 2252  LATICACIDVEN 1.8 1.5 2.3* 2.2*    Recent Results (from the past 240 hour(s))  Urine culture     Status: None   Collection Time: 10/30/15 12:38 PM  Result Value Ref Range Status   Specimen Description URINE, CATHETERIZED  Final   Special Requests NONE  Final   Culture NO GROWTH Performed at [facility]   Final   Report Status 10/31/2015 FINAL  Final  Culture, blood (routine x 2)     Status: None (Preliminary result)   Collection Time: 10/30/15 12:39 PM  Result Value Ref Range Status   Specimen Description BLOOD LEFT ANTECUBITAL  Final   Special Requests BOTTLES DRAWN AEROBIC AND ANAEROBIC 10CC EACH  Final   Culture NO GROWTH 4 DAYS  Final   Report Status PENDING  Incomplete  Culture, blood (routine x 2)     Status: None (Preliminary result)   Collection Time: 10/30/15 12:53 PM  Result Value Ref Range Status   Specimen Description BLOOD RIGHT ANTECUBITAL  Final   Special Requests BOTTLES DRAWN AEROBIC AND ANAEROBIC 6CC EACH  Final   Culture NO GROWTH 4 DAYS  Final   Report Status PENDING   Incomplete  Gastrointestinal Panel by PCR , Stool     Status: None   Collection Time: 10/31/15  2:21 AM  Result Value Ref Range Status   Campylobacter species NOT DETECTED NOT DETECTED Final   Plesimonas shigelloides NOT DETECTED NOT DETECTED Final   Salmonella species NOT DETECTED NOT DETECTED Final   Yersinia enterocolitica NOT DETECTED NOT DETECTED Final   Vibrio species NOT DETECTED NOT DETECTED Final   Vibrio cholerae NOT DETECTED NOT DETECTED Final   Enteroaggregative E coli (EAEC) NOT DETECTED NOT DETECTED Final   Enteropathogenic E coli (EPEC) NOT DETECTED NOT DETECTED Final   Enterotoxigenic E coli (ETEC) NOT DETECTED NOT DETECTED Final   Shiga like toxin producing E coli (STEC) NOT DETECTED NOT DETECTED Final   E. coli O157 NOT DETECTED NOT DETECTED Final   Shigella/Enteroinvasive E coli (EIEC) NOT DETECTED NOT DETECTED Final   Cryptosporidium NOT DETECTED NOT DETECTED Final   Cyclospora cayetanensis NOT DETECTED NOT DETECTED Final   Entamoeba histolytica NOT DETECTED NOT DETECTED Final   Giardia lamblia NOT DETECTED NOT DETECTED Final   Adenovirus F40/41 NOT DETECTED NOT DETECTED Final   Astrovirus NOT DETECTED NOT DETECTED Final   Norovirus GI/GII NOT DETECTED NOT DETECTED Final   Rotavirus A NOT DETECTED NOT DETECTED Final   Sapovirus (I, II, IV, and V) NOT DETECTED NOT DETECTED Final  C difficile quick scan w PCR reflex     Status: Abnormal   Collection Time: 10/31/15  2:21 AM  Result Value Ref Range Status   C Diff antigen POSITIVE (A) NEGATIVE Final   C Diff toxin POSITIVE (A) NEGATIVE Final   C Diff interpretation Toxin producing C. difficile detected.  Final    Comment: CRITICAL RESULT CALLED TO, READ BACK BY AND VERIFIED WITH: Marylouise Stacks AT 0341 ON [id number] BY FORSYTH K   Stool  culture (children & immunocomp patients)     Status: None   Collection Time: 10/31/15  2:21 AM  Result Value Ref Range Status   Salmonella/Shigella Screen Final report  Final    Campylobacter Culture Final report  Final   E coli, Shiga toxin Assay Negative Negative Final    Comment: (NOTE) Performed At: [facility] [place], [place] [id number] Lindon Romp MD [id number]   STOOL CULTURE REFLEX - RSASHR     Status: None   Collection Time: 10/31/15  2:21 AM  Result Value Ref Range Status   Stool Culture result 1 (RSASHR) Comment  Final    Comment: (NOTE) No Salmonella or Shigella recovered. Performed At: [facility] [address] [place], [place] [id number] Lindon Romp MD [id number]   STOOL CULTURE Reflex - CMPCXR     Status: None   Collection Time: 10/31/15  2:21 AM  Result Value Ref Range Status   Stool Culture result 1 (CMPCXR) Comment  Final    Comment: (NOTE) No Campylobacter species isolated. Performed At: [facility] [place], [place] [id number] Lindon Romp MD [id number]   Culture, Urine     Status: None   Collection Time: 11/02/15  4:02 PM  Result Value Ref Range Status   Specimen Description URINE, CATHETERIZED  Final   Special Requests Immunocompromised  Final   Culture NO GROWTH Performed at [facility]   Final   Report Status 11/03/2015 FINAL  Final  MRSA PCR Screening     Status: None   Collection Time: 11/03/15 12:10 PM  Result Value Ref Range Status   MRSA by PCR NEGATIVE NEGATIVE Final    Comment:        The GeneXpert MRSA Assay (FDA approved for NASAL specimens only), is one component of a comprehensive MRSA colonization surveillance program. It is not intended to diagnose MRSA infection nor to guide or monitor treatment for MRSA infections.          Radiology Studies: Ct Abdomen Pelvis Wo Contrast  Result Date: 11/02/2015 CLINICAL DATA:  C difficile colitis.  Abdominal pain EXAM: CT ABDOMEN AND PELVIS WITHOUT CONTRAST TECHNIQUE: Multidetector CT imaging of the abdomen and pelvis was performed following the standard protocol without  IV contrast. COMPARISON:  10/30/2015 FINDINGS: Lower chest and abdominal wall: Small pericardial effusion. Small pleural effusions. Aortic atherosclerosis, notable for age. Hepatobiliary: No focal liver abnormality.Cholecystectomy. No suspected bile duct dilatation. Pancreas: Unremarkable. Spleen: Unremarkable. Adrenals/Urinary Tract: Negative adrenals. No hydronephrosis or stone. The bladder is decompressed by Foley catheter. Stomach/Bowel: Diffuse marked colonic wall thickening and mesocolonic fat edema has worsened. Inflammation is pancolonic. The colon is more distended than previously. No visible pneumatosis or perforation. No bowel obstruction. No focal pericecal inflammation. Status post gastric bypass. Reproductive:No pathologic findings. Vascular/Lymphatic: No acute vascular abnormality. No mass or adenopathy. Other: No pneumoperitoneum. Musculoskeletal: L4-5 discectomy. Interbody fusion is questionable but there is likely posterior-lateral solid bone. Remote L1 superior endplate fracture. Spondylosis. These results were called by telephone at the time of interpretation on 11/02/2015 at 9:17 pm to Dr Marin Comment, who verbally acknowledged these results. IMPRESSION: 1. Worsening C difficile pancolitis. The colon is more distended than previously, concerning for developing toxic megacolon. No perforation. 2. New small ascites and pleural effusions. Electronically Signed   By: Monte Fantasia M.D.   On: 11/02/2015 21:35   US Renal  Result Date: 11/03/2015 CLINICAL DATA:  Acute kidney injury, UTI EXAM: RENAL / URINARY TRACT ULTRASOUND  COMPLETE COMPARISON:  CT abdomen pelvis dated 11/02/2015 FINDINGS: Right Kidney: Length: 10.5 cm.  No mass or hydronephrosis. Left Kidney: Length: 10.3 cm.  No mass or hydronephrosis. Bladder: Poorly visualized/underdistended. IMPRESSION: No hydronephrosis. Electronically Signed   By: Julian Hy M.D.   On: 11/03/2015 11:58       Scheduled Meds: . albumin human  25 g  Intravenous BID  . budesonide  0.5 mg Nebulization BID  . citalopram  20 mg Oral QHS  . DULoxetine  30 mg Oral Daily  . furosemide  80 mg Intravenous BID  . gabapentin  600 mg Oral TID  . heparin subcutaneous  5,000 Units Subcutaneous Q8H  . hydrocortisone sod succinate (SOLU-CORTEF) inj  50 mg Intravenous Q8H  . latanoprost  1 drop Both Eyes QHS  . loratadine  10 mg Oral Daily  . metronidazole  500 mg Intravenous Q8H  . montelukast  10 mg Oral QHS  . phosphorus  500 mg Oral BID  . tiotropium  1 capsule Inhalation Daily  . vancomycin  500 mg Oral Q6H   Continuous Infusions: . dextrose 5 % with KCl 20 mEq / L 20 mEq (11/04/15 [id number])  . phenylephrine (NEO-SYNEPHRINE) Adult infusion 10 mcg/min (11/04/15 0611)     LOS: 5 days    Time spent: 41 critical care minutes    Rexene Alberts, MD [facility] Pager [phone]  If 7PM-7AM, please contact night-coverage [email or web address] Password [email or web address] 11/04/2015, 9:20 AM

## 2015-11-05 DIAGNOSIS — E43 Unspecified severe protein-calorie malnutrition: Secondary | ICD-10-CM | POA: Insufficient documentation

## 2015-11-05 DIAGNOSIS — E871 Hypo-osmolality and hyponatremia: Secondary | ICD-10-CM

## 2015-11-05 LAB — RENAL FUNCTION PANEL
ALBUMIN: 2.6 g/dL — AB (ref 3.5–5.0)
ANION GAP: 5 (ref 5–15)
BUN: 37 mg/dL — AB (ref 6–20)
CO2: 20 mmol/L — AB (ref 22–32)
Calcium: 7.6 mg/dL — ABNORMAL LOW (ref 8.9–10.3)
Chloride: 101 mmol/L (ref 101–111)
Creatinine, Ser: 1.85 mg/dL — ABNORMAL HIGH (ref 0.44–1.00)
GFR calc Af Amer: 34 mL/min — ABNORMAL LOW (ref >60)
GFR calc non Af Amer: 29 mL/min — ABNORMAL LOW (ref >60)
GLUCOSE: 135 mg/dL — AB (ref 65–99)
PHOSPHORUS: 4.3 mg/dL (ref 2.5–4.6)
POTASSIUM: 3.8 mmol/L (ref 3.5–5.1)
SODIUM: 126 mmol/L — AB (ref 135–145)

## 2015-11-05 LAB — MYOGLOBIN, URINE: Myoglobin, Ur: <2 ng/mL (ref 0–13)

## 2015-11-05 LAB — CBC
HEMATOCRIT: 32.5 % — AB (ref 36.0–46.0)
Hemoglobin: 11.1 g/dL — ABNORMAL LOW (ref 12.0–15.0)
MCH: 28.8 pg (ref 26.0–34.0)
MCHC: 34.2 g/dL (ref 30.0–36.0)
MCV: 84.2 fL (ref 78.0–100.0)
Platelets: 198 10*3/uL (ref 150–400)
RBC: 3.86 MIL/uL — ABNORMAL LOW (ref 3.87–5.11)
RDW: 16 % — AB (ref 11.5–15.5)
WBC: 18.3 10*3/uL — ABNORMAL HIGH (ref 4.0–10.5)

## 2015-11-05 LAB — MAGNESIUM: MAGNESIUM: 2 mg/dL (ref 1.7–2.4)

## 2015-11-05 MED ORDER — NYSTATIN 100000 UNIT/ML MT SUSP
5.0000 mL | Freq: Four times a day (QID) | OROMUCOSAL | Status: DC
  Administered 2015-11-05 – 2015-11-19 (×36): 500000 [IU] via ORAL
  Filled 2015-11-05 (×39): qty 5

## 2015-11-05 MED ORDER — SODIUM BICARBONATE 8.4 % IV SOLN
INTRAVENOUS | Status: DC
  Administered 2015-11-05 – 2015-11-09 (×8): via INTRAVENOUS
  Filled 2015-11-05 (×18): qty 1000

## 2015-11-05 MED ORDER — FUROSEMIDE 10 MG/ML IJ SOLN
INTRAMUSCULAR | Status: AC
  Filled 2015-11-05: qty 30

## 2015-11-05 MED ORDER — BOOST / RESOURCE BREEZE PO LIQD
1.0000 | Freq: Two times a day (BID) | ORAL | Status: DC
  Administered 2015-11-06 – 2015-11-07 (×3): 1 via ORAL

## 2015-11-05 NOTE — Progress Notes (Addendum)
Nutrition Follow-up  DOCUMENTATION CODES:  Severe malnutrition in context of acute illness/injury, Morbid obesity   Pt meets criteria for SEVERE MALNUTRITION in the context of ACUTE ILLNESS as evidenced by an estimated oral intake that met < or equal to 50% of needs for > or equal to 5 Days.  INTERVENTION:  Recommend diet advancement if medically able -Pt meets criteria for severe malnutrition and is unable to meet caloric requirements with clear liquid diet. She reports tolerance to her liquids today and also desires diet advancement -addendum, spoke with Dr. Laural Golden who felt patient was suitable for diet advancement   Boost Breeze po BID, each supplement provides 250 kcal and 9 grams of protein  NUTRITION DIAGNOSIS:  Inadequate oral intake related to nausea, acute illness and diarrhea evidenced by an estimated energy intake that met < or equal to 50% of needs for > or equal to 5 days  GOAL:  Patient will meet greater than or equal to 90% of their needs  MONITOR:  PO intake, Supplement acceptance, Diet advancement, Labs, Weight trends, I & O's   ASSESSMENT:  55 y/o female history obesity, DJD, allergies, HTN, HLD, chronic pain, afib, recent back surgery. Presents with 2 week history of abdominal pain and diarrhea which began after recent back surgery. CT in ED showed diffuse pancolitis. Admitted for management. Work up is positive for CDIFF  Interval history as of 7/25: Pt's diet was advanced to Full liquid on 7/25. She became acidotic in metabolic setting. Was found to have UTI. Nephrology consulted due to rising creatinine/aki. Her diarrhea has not improved. Developed peripheral edema. CT on 7/22 showed worsening inflammation, potentially megacolon. Transferred to ICU and diet downgraded to CL on 7/23. Continuous to have high stool output.   Intake history for admission No intake from 7/19-7/20 7/21 240, 240, 315ms fluid of CL diet 7/22: 240, 240, 240 of FL diet 7/23: 240 ml CL  diet 7/24 120, 120, 120, 240, 240 of CL diet 7/25 120 of CL diet  Pt only consumed one day worth of a FL diet. CL calorie provision is inadequate.   Today pt reports that she is feeling "a bit better". She is seen with her lunch tray with roughly 50% consumed. She says she was able to tolerate the clear liquids fairly well. She does not believe they caused her any extra pain or diarrhea. She is asking when her diet can be advanced. Specifically she is requesting soft foods and vegetables.   RD stated that if MD gave approval, he would have no problem with this if she would drink a supplement for protein. Went over the importance of protein, especially with her wound dehiscence. She agreed to trying a supplement.   RD will ask MD if patients diet can be advanced at this time   She has gained roughly 40 lbs since she was first seen. Assessed lower extremities. Moderate edema noted.   Labs reviewed: CDIFF positive, WBC: 18.3 (improved), albumin: 2.9 (improved), total pro: 4.8   Recent Labs Lab 11/02/15 0623  11/03/15 0806 11/04/15 0439 11/04/15 1627 11/05/15 0440  NA  --   < > 131* 130* 126*  --   K  --   < > 4.1 4.4 4.5  --   CL  --   < > 104 102 99*  --   CO2  --   < > 19* 19* 19*  --   BUN  --   < > 37* 38* 37*  --  CREATININE  --   < > 2.10* 1.95* 1.93*  --   CALCIUM  --   < > 6.8* 7.9* 7.9*  --   MG  --   < > 2.1 2.1  --  2.0  PHOS 1.5*  --   --  4.2 4.4  --   GLUCOSE  --   < > 119* 131* 130*  --   < > = values in this interval not displayed. Diet Order:  Diet clear liquid Room service appropriate? Yes; Fluid consistency: Thin  Skin: MSAD perineum, Dehisced/open back wound x3,  Dehisced/open wound to L buttocks,   Last BM:  7/25-diarrhea  Height:  Ht Readings from Last 1 Encounters:  10/30/15 _0  (1.575 m)   Weight:  Wt Readings from Last 1 Encounters:  11/05/15 286 lb 9.6 oz (130 kg)   Wt Readings from Last 10 Encounters:  11/05/15 286 lb 9.6 oz (130 kg)   09/17/15 247 lb (112 kg)  08/28/15 247 lb 12.8 oz (112.4 kg)  08/26/15 240 lb (108.9 kg)  06/21/15 253 lb (114.8 kg)  06/07/15 236 lb (107 kg)  10/30/14 260 lb (117.9 kg)  09/11/14 262 lb 5.6 oz (119 kg)  05/13/14 257 lb 9.6 oz (116.8 kg)  11/26/12 235 lb (106.6 kg)  Dosing weight: 246 lbs-admit weight  Ideal Body Weight:  50 kg  BMI:  Body mass index is 52.42 kg/m.  Estimated Nutritional Needs:  Kcal:  1550-1750 kcals  Protein:  65-75 g Pro (1.3-1.5 g/kg IBW) Fluid:  >1.6 liters fluid + enough to replace stool losses  EDUCATION NEEDS:  No education needs identified at this time  Burtis Junes RD, LDN, [facility] Pager: [phone] 11/05/2015 3:35 PM

## 2015-11-05 NOTE — Progress Notes (Signed)
Subjective: Abdominal pain persists but not unchanged or worsened. Still with watery stools. Poor appetite. Alert and conversant. Complains of sore throat. 700 ml in 24 hours, prior to this was measured in occurrences.   Objective: Vital signs in last 24 hours: Temp:  [96.7 F (35.9 C)-97.5 F (36.4 C)] 97 F (36.1 C) (07/25 0849) Pulse Rate:  [79-98] 87 (07/25 0600) Resp:  [14-22] 18 (07/25 0600) BP: (92-139)/(61-109) 125/109 (07/25 0600) SpO2:  [81 %-100 %] 100 % (07/25 0900) Weight:  [286 lb 9.6 oz (130 kg)] 286 lb 9.6 oz (130 kg) (07/25 0500) Last BM Date: 11/05/15 General:   Alert and oriented, pleasant Mouth:  White plaque noted on tongue   Abdomen:  Bowel sounds present, obese, mild to moderate TTP diffusely but without rebound or guarding, no peritoneal signs  Extremities:  3+ pitting edema bilateral lower extremities, generalized anasarca  Neurologic:  Alert and  oriented x4 Psych:  Alert and cooperative.   Intake/Output from previous day: 07/24 0701 - 07/25 0700 In: 4181 [P.O.:960; I.V.:2721; IV Piggyback:500] Out: 1375 [Urine:675; Stool:700] Intake/Output this shift: No intake/output data recorded.  Lab Results:  Recent Labs  11/03/15 0806 11/04/15 0439 11/05/15 0440  WBC 32.2* 24.4* 18.3*  HGB 12.5 10.8* 11.1*  HCT 36.9 32.2* 32.5*  PLT 272 235 198   BMET  Recent Labs  11/03/15 0806 11/04/15 0439 11/04/15 1627  NA 131* 130* 126*  K 4.1 4.4 4.5  CL 104 102 99*  CO2 19* 19* 19*  GLUCOSE 119* 131* 130*  BUN 37* 38* 37*  CREATININE 2.10* 1.95* 1.93*  CALCIUM 6.8* 7.9* 7.9*   LFT  Recent Labs  11/04/15 0439 11/04/15 1627  PROT 4.3*  --   ALBUMIN 2.6* 2.9*  AST 23  --   ALT 10*  --   ALKPHOS 62  --   BILITOT 0.3  --      Studies/Results: US Renal  Result Date: 11/03/2015 CLINICAL DATA:  Acute kidney injury, UTI EXAM: RENAL / URINARY TRACT ULTRASOUND COMPLETE COMPARISON:  CT abdomen pelvis dated 11/02/2015 FINDINGS: Right Kidney:  Length: 10.5 cm.  No mass or hydronephrosis. Left Kidney: Length: 10.3 cm.  No mass or hydronephrosis. Bladder: Poorly visualized/underdistended. IMPRESSION: No hydronephrosis. Electronically Signed   By: Julian Hy M.D.   On: 11/03/2015 11:58  Dg Abd Portable 1v  Result Date: 11/04/2015 CLINICAL DATA:  Colitis EXAM: PORTABLE ABDOMEN - 1 VIEW COMPARISON:  CT 11/02/2015 FINDINGS: Nonobstructive bowel gas pattern. No visible free air on these supine views. No organomegaly or suspicious calcification. Prior fusion in the lower lumbar spine. IMPRESSION: No evidence of bowel obstruction or free air. Electronically Signed   By: Rolm Baptise M.D.   On: 11/04/2015 12:09   Assessment: 55 year old female with severe/complicated Cdiff colitis that has been slowly responding to therapy. Although she notes abdominal pain, this is unchanged from yesterday and she is without any concerning physical findings on exam. Alert, conversant, and smiling during conversation. Appears that clinically, she is much improved from admission, with leukocytosis improving, renal function improving. Stool output has been difficult to measure, as some has been leaking from around the rectal tube. Recommend strict I/Os if at all possible. Could consider off-label Questran for supportive measures if continues to improve. Appreciate nephrology recommendations and support.   Anasarca: in setting of hypoalbuminemia, acute illness.   Adrenal insufficiency: on IV steroids.   Acute renal injury: nephrology on board.  Oral thrush: new finding. Patient noted sore throat  with swallowing. Will start nystatin swish and swallow.     Plan: Vancomycin 500 mg QID Continue Flagyl 500 mg IV  Add Nystatin swish and swallow Strict I/Os Could consider off-label Questran for supportive measures if patient continues to clinically improve Monitor for signs/symptoms of toxic megacolon  Orvil Feil, ANP-BC [facility]      LOS: 6 days    11/05/2015, 9:01 AM

## 2015-11-05 NOTE — Progress Notes (Signed)
Subjective: Interval History: Patient states that she is still having diarrhea and is not getting any better. She doesn't have any abdominal pain nausea or vomiting.  Objective: Vital signs in last 24 hours: Temp:  [96.7 F (35.9 C)-97.5 F (36.4 C)] 97.2 F (36.2 C) (07/25 0400) Pulse Rate:  [79-98] 87 (07/25 0600) Resp:  [14-22] 18 (07/25 0600) BP: (92-139)/(61-109) 125/109 (07/25 0600) SpO2:  [81 %-100 %] 100 % (07/25 0600) Weight:  [130 kg (286 lb 9.6 oz)] 130 kg (286 lb 9.6 oz) (07/25 0500) Weight change: 1.7 kg (3 lb 12 oz)  Intake/Output from previous day: 07/24 0701 - 07/25 0700 In: 4181 [P.O.:960; I.V.:2721; IV Piggyback:500] Out: 1375 [Urine:675; Stool:700] Intake/Output this shift: No intake/output data recorded.  General appearance: alert, cooperative and no distress Resp: clear to auscultation bilaterally Cardio: regular rate and rhythm, S1, S2 normal, no murmur, click, rub or gallop GI: Patient with some abdominal tenderness but no rebound tenderness. Positive bowel sounds Extremities: edema 1-2+ edema  Lab Results:  Recent Labs  11/04/15 0439 11/05/15 0440  WBC 24.4* 18.3*  HGB 10.8* 11.1*  HCT 32.2* 32.5*  PLT 235 198   BMET:   Recent Labs  11/04/15 0439 11/04/15 1627  NA 130* 126*  K 4.4 4.5  CL 102 99*  CO2 19* 19*  GLUCOSE 131* 130*  BUN 38* 37*  CREATININE 1.95* 1.93*  CALCIUM 7.9* 7.9*   No results for input(s): PTH in the last 72 hours. Iron Studies: No results for input(s): IRON, TIBC, TRANSFERRIN, FERRITIN in the last 72 hours.  Studies/Results: US Renal  Result Date: 11/03/2015 CLINICAL DATA:  Acute kidney injury, UTI EXAM: RENAL / URINARY TRACT ULTRASOUND COMPLETE COMPARISON:  CT abdomen pelvis dated 11/02/2015 FINDINGS: Right Kidney: Length: 10.5 cm.  No mass or hydronephrosis. Left Kidney: Length: 10.3 cm.  No mass or hydronephrosis. Bladder: Poorly visualized/underdistended. IMPRESSION: No hydronephrosis. Electronically Signed    By: Julian Hy M.D.   On: 11/03/2015 11:58  Dg Abd Portable 1v  Result Date: 11/04/2015 CLINICAL DATA:  Colitis EXAM: PORTABLE ABDOMEN - 1 VIEW COMPARISON:  CT 11/02/2015 FINDINGS: Nonobstructive bowel gas pattern. No visible free air on these supine views. No organomegaly or suspicious calcification. Prior fusion in the lower lumbar spine. IMPRESSION: No evidence of bowel obstruction or free air. Electronically Signed   By: Rolm Baptise M.D.   On: 11/04/2015 12:09   I have reviewed the patient's current medications.  Assessment/Plan: Problem #1 acute kidney injury: Renal function seems to be stable. Patient is put on IV Lasix presently she is non-oliguric but her urine output didn't improve significantly. Problem #2 . Pancolitis: Still patient continue with diarrhea. Presently her blood pressure is better but her white blood cell count is increasing. Problem #3 history of asthma: She is on inhalers Problem #4 low CO2: Respiratory alkalosis. Her CO2 is stable. Problem #5 hypophosphatemia: Her phosphorus is normal Problem #6 hypokalemia: Patient to potassium supplement . Her potassium has normalized Problem #7 hypocalcemia on calcium supplement. Her calcium is normal. Problem #8 anasarca: Presently she is on albumin and Lasix. She had only 600 mL of urine output. Patient also has about 700 mL of diarrhea. Still patient with fluid overload. Problem #9 hypotension: Her blood pressure is much better. Plan: 1] we'll continue his Lasix 2] we'll DC albumin 3] we'll check her renal panel in the morning   LOS: 6 days   Caroline Longie S 11/05/2015,7:31 AM

## 2015-11-05 NOTE — Progress Notes (Signed)
PROGRESS NOTE    Deborah Murray  [id number] DOB: October 29, 1960 DOA: 10/30/2015 PCP: Sol Passer, MD    Brief Narrative:  Patient is a 55 y.o. female with history of atrial fibrillation, HTN severe asthma/allergies, adrenal insufficiency, and recent back surgery, who presented to the ED on 10/30/2015 with a chief complaint of abdominal pain and diarrhea. She was discharged from the hospital from back surgery approximately 2 weeks ago. When she got home, she started having loose stools. She does not recall being treated with an antibiotic during the hospitalization for back surgery. She reported fevers at home, but no nausea or vomiting. She had felt very weak.  In the ED, she was febrile with a temperature of 102.3. Her blood pressure was on the low side of normal. Her white blood cell count was 23.7. CT of her abdomen and pelvis revealed diffuse edematous wall thickening in the colon from the cecal tip to the rectum associated with diffuse pericolonic edema/inflammation; compatible with infectious/inflammatory pancolitis. C. diff toxin PCR was positive. GI pathogen panel was negative. Follow-up CT of the abdomen and pelvis revealed worsening pancolitis. Surgery and GI were consulted. Nephrology consulted for acute renal failure. She will l need several more days in the hospital for treatment.  Assessment & Plan:   Principal Problem:   Pancolitis ([place]) Active Problems:   C. difficile diarrhea   C. difficile colitis   Acute kidney injury ([place])   Sepsis ([place])   Metabolic acidosis   Hypokalemia   Essential hypertension   Severe persistent asthma   Morbid obesity ([facility])   Hypotension   History of atrial fibrillation- NSR now   History of adrenal insufficiency   History of hypertension   Hypocalcemia   Hyponatremia   AKI (acute kidney injury) ([place])   Pyrexia   Pancolitis secondary to C. difficile colitis >>>? Megacolon Admitting physician, Dr. Lorrin Jackson discussed the patient with the  on-call gastroenterologist. Per recommendation, she was started on IV Flagyl and oral vancomycin for presumed C. difficile colitis. Stool studies were ordered and the C. difficile PCR came back positive. GI pathogen panel came back negative. -Due to increased white blood cell count and worsening abdominal pain, follow-up CT of the abdomen/pelvis was ordered on 7/22. It revealed worsening C. difficile pancolitis, concerning for toxic megacolon. Vancomycin enema was given to patient 1 per Dr. Marin Comment. Oral vancomycin increased to every 6 hours. -General surgeon, Dr. Arnoldo Morale has been consulted. He did not believe she had a surgical abdomen. -GI, Dr. Laural Golden was consulted. G He agreed with increasing the oral vancomycin and continuing IV Flagyl. He discontinued the vancomycin enemas. He increased the hydrocortisone to 50 mg IV every 8 hours on 11/03/15. -She continues to have copious amounts of watery stool. Deferring to GI regarding anything that can help to decrease the stool volume output. -Patient's white blood cell count is starting to trend down.  Hypotension secondary to Sepsis/initially volume depletion/adrenal insufficiency.  (sepsis not present on admission). Patient was started on vigorous IV fluids for diarrhea. Stress dose Solumedrol at 125 mg was given 1, then she was placed on every 8 hour IV hydrocortisone. Due to decreasing blood pressures and to start of Lasix, she was transferred to the ICU for closer management. IV Neo-Synephrine was ordered. -Blood cultures and urine culture are negative to date. -Her blood pressure has improved. We'll discontinue Neo-Synephrine and continue IV fluids.  Hyponatremia. Bicarbonate was inadvertently stopped in the IV fluids, so the patient has been getting hypotonic fluid  for the past couple days. This has caused her sodium level to decrease to 126. -IV fluids changed to half-normal saline with an amp of bicarbonate today. -We'll continue to monitor her  serum sodium.  Acute kidney injury, possibly from ATN/cortical necrosis. The patient's creatinine has progressively increased; high of 2.46, currently at 2.10. Her urine output had been oliguric, 500 cc or less. She was given several boluses of IV fluids on 11/01/15 with no significant increase in her urine output. Foley catheter started draining very dark Coca[employer]a colored urine. -Renal ultrasound was unremarkable. -Dr. Lowanda Foster was consulted and started IV albumin infusion, IV Lasix, and adjustments in her IV fluids. However, due to her low blood pressure, Lasix was initially withheld. After Neo-Synephrine was started, IV Lasix was changed to a Lasix drip. -CK ordered and was within normal limits. Urine myoglobin is pending. -Her urine output is picking up and is nonoliguric, but most of the output has been watery stool. Her creatinine has improved slightly to 1.93.  Hypokalemia. Patient's serum potassium was 2.1 on admission. Her magnesium level was 1.7. She was given several runs of IV potassium. Her serum potassium improved, but decreased again to 2.5, possibly from bicarbonate in IV fluids and diarrhea. With additional runs, her serum potassium has improved. Hypomagnesemia. Magnesium was 1.1. She was given 2 g of magnesium sulfate. Her magnesium level improved to 2.0. Hypocalcemia/hypophosphatemia. The patient's serum was 7.1 on admission. It has decreased to 6.3. She was started on calcium supplementation. -Her albumin was only 1.9 which contributed to hypocalcemia. Calcium supplementation was started. Her calcium was also low. Dr. Lowanda Foster started phosphorus supplementation. As above, albumin infusions were started for several days, but will be discontinued today per Dr. Lowanda Foster. -Her calcium and phosphorus have improved. Her albumin has improved.  Metabolic acidosis. The patient's CO2(bicarbonate) progressively decreased to a nadir of 12. She was started on bicarbonate infusion. Her  lactic acid level increased from 1.5-2.3. ABG on 7/22 revealed pH of 7.4, PCO2 of 24, and a PO2 of 82; indicating compensated alkalosis. -Her CO2 is improving, but is still below normal range. Restart bicarbonate infusion.  Adrenal insufficiency. The patient takes 15 mg of prednisone daily. On admission, she was given 125 mg of Solu-Medrol. She was subsequently started on Solu-Cortef IV every 8 hours. It was weaned down to every 12 hours. -Due to decreased blood pressures, Solu-Cortef was titrated back up to 50 mg every 8 hours on 11/03/15.  UTI with oliguria and large hemoglobin-in 6-20 RBCs. -Foley catheter started draining dark-colored urine. -Urinalysis on 7/22 was indicative of infection. Cipro was started 1 dose, but it was discontinued in light of worsening colitis. Urine culture is negative. Urine myoglobin is pending.  Peripheral edema. Patient is now developing peripheral edema, presumably from her low urine output and diarrhea.  20 mg was Lasix  given 1. Dr. Lowanda Foster ordered 80 mg of Lasix IV every 12 hours, but it was initially held due to hypotension. Her blood pressure is improved. Lasix changed to drip.  Chronic asthma. Currently stable. She was continued on Singulair, Spiriva and Pulmicort.        DVT prophylaxis: Subcutaneous heparin Code Status: Full code Family Communication: Family not available Disposition Plan: Discharge when clinically appropriate, in several days   Consultants:   None  Procedures:   None  Antimicrobials:   Oral vancomycin 10/30/15  IV metronidazole 10/30/15    Subjective: Patient is somewhat aggravated by the watery stools despite the rectal tube.  Objective: Vitals:  11/05/15 0600 11/05/15 0849 11/05/15 0857 11/05/15 0900  BP: (!) 125/109     Pulse: 87     Resp: 18     Temp:  97 F (36.1 C)    TempSrc:  Axillary    SpO2: 100%  100% 100%  Weight:      Height:        Intake/Output Summary (Last 24 hours) at  11/05/15 1157 Last data filed at 11/05/15 1100  Gross per 24 hour  Intake             3501 ml  Output             1375 ml  Net             2126 ml   Filed Weights   11/03/15 0600 11/04/15 0500 11/05/15 0500  Weight: 126.1 kg (278 lb) 128.3 kg (282 lb 13.6 oz) 130 kg (286 lb 9.6 oz)    Examination:  General exam: Appears calm and comfortable; obese 55 year old African-American who appears In no acute distress.  Respiratory system: Clear to auscultation. Respiratory effort normal. Cardiovascular system: S1 & S2 heard, RRR. No JVD, murmurs, rubs, gallops or clicks. 1-2+ bilateral lower extremity edema. Gastrointestinal system: Abdomen is obese and moderately diffusely tender; mildly to moderately distended No organomegaly or masses felt. Normal bowel sounds heard. Rectal tube with watery light brown stool. GU: Indwelling Foley catheter draining dark cold cola-colored urine, but lighter than yesterday. Central nervous system: Alert and oriented. No focal neurological deficits. Extremities: Symmetric 5 x 5 power. Skin: No rashes, lesions or ulcers Psychiatry: Judgement and insight appear normal. Mood & affect appropriate.     Data Reviewed: I have personally reviewed following labs and imaging studies  CBC:  Recent Labs Lab 10/30/15 1057  11/01/15 0500 11/02/15 0623 11/03/15 0806 11/04/15 0439 11/05/15 0440  WBC 23.7*  < > 22.7* 28.6* 32.2* 24.4* 18.3*  NEUTROABS 21.9*  --   --   --   --   --   --   HGB 10.8*  < > 12.3 10.6* 12.5 10.8* 11.1*  HCT 32.6*  < > 37.9 32.0* 36.9 32.2* 32.5*  MCV 85.6  < > 85.9 86.0 82.6 83.9 84.2  PLT 493*  < > 420* 267 272 235 198  < > = values in this interval not displayed. Basic Metabolic Panel:  Recent Labs Lab 11/02/15 0623 11/02/15 1245 11/02/15 2252 11/03/15 0328 11/03/15 0806 11/04/15 0439 11/04/15 1627 11/05/15 0440  NA  --  135 130*  --  131* 130* 126*  --   K  --  2.5* 4.5  --  4.1 4.4 4.5  --   CL  --  119* 105  --  104  102 99*  --   CO2  --  12* 20*  --  19* 19* 19*  --   GLUCOSE  --  107* 117*  --  119* 131* 130*  --   BUN  --  27* 36*  --  37* 38* 37*  --   CREATININE  --  1.81* 2.46*  --  2.10* 1.95* 1.93*  --   CALCIUM  --  4.1* 6.5*  --  6.8* 7.9* 7.9*  --   MG  --  1.1*  --  2.1 2.1 2.1  --  2.0  PHOS 1.5*  --   --   --   --  4.2 4.4  --    GFR: Estimated Creatinine Clearance: 41.7 mL/min (by  C-G formula based on SCr of 1.93 mg/dL). Liver Function Tests:  Recent Labs Lab 10/30/15 1239 11/01/15 2038 11/04/15 0439 11/04/15 1627  AST 16 26 23   --   ALT 13* 14 10*  --   ALKPHOS 88 79 62  --   BILITOT 1.1 0.4 0.3  --   PROT 4.8* 4.6* 4.3*  --   ALBUMIN 2.1* 1.9* 2.6* 2.9*    Recent Labs Lab 10/30/15 1057  LIPASE 9*   No results for input(s): AMMONIA in the last 168 hours. Coagulation Profile: No results for input(s): INR, PROTIME in the last 168 hours. Cardiac Enzymes:  Recent Labs Lab 10/30/15 1239 11/03/15 0957  CKTOTAL  --  36*  TROPONINI <0.03  --    BNP (last 3 results) No results for input(s): PROBNP in the last 8760 hours. HbA1C: No results for input(s): HGBA1C in the last 72 hours. CBG: No results for input(s): GLUCAP in the last 168 hours. Lipid Profile: No results for input(s): CHOL, HDL, LDLCALC, TRIG, CHOLHDL, LDLDIRECT in the last 72 hours. Thyroid Function Tests: No results for input(s): TSH, T4TOTAL, FREET4, T3FREE, THYROIDAB in the last 72 hours. Anemia Panel: No results for input(s): VITAMINB12, FOLATE, FERRITIN, TIBC, IRON, RETICCTPCT in the last 72 hours. Sepsis Labs:  Recent Labs Lab 10/30/15 1239 10/30/15 1525 11/02/15 1245 11/02/15 2252  LATICACIDVEN 1.8 1.5 2.3* 2.2*    Recent Results (from the past 240 hour(s))  Urine culture     Status: None   Collection Time: 10/30/15 12:38 PM  Result Value Ref Range Status   Specimen Description URINE, CATHETERIZED  Final   Special Requests NONE  Final   Culture NO GROWTH Performed at [facility]   Final   Report Status 10/31/2015 FINAL  Final  Culture, blood (routine x 2)     Status: None   Collection Time: 10/30/15 12:39 PM  Result Value Ref Range Status   Specimen Description BLOOD LEFT ANTECUBITAL  Final   Special Requests BOTTLES DRAWN AEROBIC AND ANAEROBIC 10CC EACH  Final   Culture NO GROWTH 5 DAYS  Final   Report Status 11/04/2015 FINAL  Final  Culture, blood (routine x 2)     Status: None   Collection Time: 10/30/15 12:53 PM  Result Value Ref Range Status   Specimen Description BLOOD RIGHT ANTECUBITAL  Final   Special Requests BOTTLES DRAWN AEROBIC AND ANAEROBIC 6CC EACH  Final   Culture NO GROWTH 5 DAYS  Final   Report Status 11/04/2015 FINAL  Final  Gastrointestinal Panel by PCR , Stool     Status: None   Collection Time: 10/31/15  2:21 AM  Result Value Ref Range Status   Campylobacter species NOT DETECTED NOT DETECTED Final   Plesimonas shigelloides NOT DETECTED NOT DETECTED Final   Salmonella species NOT DETECTED NOT DETECTED Final   Yersinia enterocolitica NOT DETECTED NOT DETECTED Final   Vibrio species NOT DETECTED NOT DETECTED Final   Vibrio cholerae NOT DETECTED NOT DETECTED Final   Enteroaggregative E coli (EAEC) NOT DETECTED NOT DETECTED Final   Enteropathogenic E coli (EPEC) NOT DETECTED NOT DETECTED Final   Enterotoxigenic E coli (ETEC) NOT DETECTED NOT DETECTED Final   Shiga like toxin producing E coli (STEC) NOT DETECTED NOT DETECTED Final   E. coli O157 NOT DETECTED NOT DETECTED Final   Shigella/Enteroinvasive E coli (EIEC) NOT DETECTED NOT DETECTED Final   Cryptosporidium NOT DETECTED NOT DETECTED Final   Cyclospora cayetanensis NOT DETECTED NOT DETECTED Final  Entamoeba histolytica NOT DETECTED NOT DETECTED Final   Giardia lamblia NOT DETECTED NOT DETECTED Final   Adenovirus F40/41 NOT DETECTED NOT DETECTED Final   Astrovirus NOT DETECTED NOT DETECTED Final   Norovirus GI/GII NOT DETECTED NOT DETECTED Final   Rotavirus A NOT DETECTED  NOT DETECTED Final   Sapovirus (I, II, IV, and V) NOT DETECTED NOT DETECTED Final  C difficile quick scan w PCR reflex     Status: Abnormal   Collection Time: 10/31/15  2:21 AM  Result Value Ref Range Status   C Diff antigen POSITIVE (A) NEGATIVE Final   C Diff toxin POSITIVE (A) NEGATIVE Final   C Diff interpretation Toxin producing C. difficile detected.  Final    Comment: CRITICAL RESULT CALLED TO, READ BACK BY AND VERIFIED WITH: Marylouise Stacks AT [id number] ON [id number] BY FORSYTH K   Stool culture (children & immunocomp patients)     Status: None   Collection Time: 10/31/15  2:21 AM  Result Value Ref Range Status   Salmonella/Shigella Screen Final report  Final   Campylobacter Culture Final report  Final   E coli, Shiga toxin Assay Negative Negative Final    Comment: (NOTE) Performed At: [facility] [place], [place] [id number] Lindon Romp MD [id number]   STOOL CULTURE REFLEX - RSASHR     Status: None   Collection Time: 10/31/15  2:21 AM  Result Value Ref Range Status   Stool Culture result 1 (RSASHR) Comment  Final    Comment: (NOTE) No Salmonella or Shigella recovered. Performed At: [facility] [address] [place], [place] [id number] Lindon Romp MD [id number]   STOOL CULTURE Reflex - CMPCXR     Status: None   Collection Time: 10/31/15  2:21 AM  Result Value Ref Range Status   Stool Culture result 1 (CMPCXR) Comment  Final    Comment: (NOTE) No Campylobacter species isolated. Performed At: [facility] [place], [place] [id number] Lindon Romp MD [id number]   Culture, Urine     Status: None   Collection Time: 11/02/15  4:02 PM  Result Value Ref Range Status   Specimen Description URINE, CATHETERIZED  Final   Special Requests Immunocompromised  Final   Culture NO GROWTH Performed at [facility]   Final   Report Status 11/03/2015 FINAL  Final  MRSA PCR Screening     Status: None    Collection Time: 11/03/15 12:10 PM  Result Value Ref Range Status   MRSA by PCR NEGATIVE NEGATIVE Final    Comment:        The GeneXpert MRSA Assay (FDA approved for NASAL specimens only), is one component of a comprehensive MRSA colonization surveillance program. It is not intended to diagnose MRSA infection nor to guide or monitor treatment for MRSA infections.          Radiology Studies: Dg Abd Portable 1v  Result Date: 11/04/2015 CLINICAL DATA:  Colitis EXAM: PORTABLE ABDOMEN - 1 VIEW COMPARISON:  CT 11/02/2015 FINDINGS: Nonobstructive bowel gas pattern. No visible free air on these supine views. No organomegaly or suspicious calcification. Prior fusion in the lower lumbar spine. IMPRESSION: No evidence of bowel obstruction or free air. Electronically Signed   By: Rolm Baptise M.D.   On: 11/04/2015 12:09       Scheduled Meds: . budesonide  0.5 mg Nebulization BID  . citalopram  20 mg Oral QHS  . DULoxetine  30 mg Oral Daily  .  gabapentin  600 mg Oral TID  . heparin subcutaneous  5,000 Units Subcutaneous Q8H  . hydrocortisone sod succinate (SOLU-CORTEF) inj  50 mg Intravenous Q8H  . latanoprost  1 drop Both Eyes QHS  . loratadine  10 mg Oral Daily  . metronidazole  500 mg Intravenous Q8H  . montelukast  10 mg Oral QHS  . nystatin  5 mL Oral QID  . tiotropium  1 capsule Inhalation Daily  . vancomycin  500 mg Oral Q6H   Continuous Infusions: . dextrose 5 % and 0.45% NaCl 1,000 mL with sodium bicarbonate 50 mEq infusion 125 mL/hr at 11/05/15 1029  . furosemide (LASIX) infusion 8 mg/hr (11/05/15 1100)  . phenylephrine (NEO-SYNEPHRINE) Adult infusion Stopped (11/04/15 1037)     LOS: 6 days    Time spent: 35 Minutes.    Rexene Alberts, MD [facility] Pager [phone]  If 7PM-7AM, please contact night-coverage [email or web address] Password [facility] 11/05/2015, 11:57 AM

## 2015-11-06 LAB — RENAL FUNCTION PANEL
Albumin: 2.5 g/dL — ABNORMAL LOW (ref 3.5–5.0)
Anion gap: 7 (ref 5–15)
BUN: 36 mg/dL — AB (ref 6–20)
CHLORIDE: 101 mmol/L (ref 101–111)
CO2: 21 mmol/L — AB (ref 22–32)
CREATININE: 1.83 mg/dL — AB (ref 0.44–1.00)
Calcium: 7.5 mg/dL — ABNORMAL LOW (ref 8.9–10.3)
GFR, EST AFRICAN AMERICAN: 34 mL/min — AB (ref >60)
GFR, EST NON AFRICAN AMERICAN: 30 mL/min — AB (ref >60)
Glucose, Bld: 120 mg/dL — ABNORMAL HIGH (ref 65–99)
POTASSIUM: 3.7 mmol/L (ref 3.5–5.1)
Phosphorus: 4.2 mg/dL (ref 2.5–4.6)
Sodium: 129 mmol/L — ABNORMAL LOW (ref 135–145)

## 2015-11-06 LAB — CBC
HEMATOCRIT: 32.7 % — AB (ref 36.0–46.0)
Hemoglobin: 11.2 g/dL — ABNORMAL LOW (ref 12.0–15.0)
MCH: 28.4 pg (ref 26.0–34.0)
MCHC: 34.3 g/dL (ref 30.0–36.0)
MCV: 82.8 fL (ref 78.0–100.0)
PLATELETS: 201 10*3/uL (ref 150–400)
RBC: 3.95 MIL/uL (ref 3.87–5.11)
RDW: 15.8 % — AB (ref 11.5–15.5)
WBC: 18.2 10*3/uL — AB (ref 4.0–10.5)

## 2015-11-06 LAB — MAGNESIUM: Magnesium: 1.8 mg/dL (ref 1.7–2.4)

## 2015-11-06 MED ORDER — LATANOPROST 0.005 % OP SOLN
OPHTHALMIC | Status: AC
  Filled 2015-11-06: qty 2.5

## 2015-11-06 MED ORDER — SACCHAROMYCES BOULARDII 250 MG PO CAPS
250.0000 mg | ORAL_CAPSULE | Freq: Two times a day (BID) | ORAL | Status: DC
  Administered 2015-11-06 – 2015-11-19 (×27): 250 mg via ORAL
  Filled 2015-11-06 (×27): qty 1

## 2015-11-06 MED ORDER — FAMOTIDINE 20 MG PO TABS
20.0000 mg | ORAL_TABLET | Freq: Two times a day (BID) | ORAL | Status: DC
  Administered 2015-11-06 – 2015-11-09 (×6): 20 mg via ORAL
  Filled 2015-11-06 (×6): qty 1

## 2015-11-06 MED ORDER — HYDROCORTISONE NA SUCCINATE PF 100 MG IJ SOLR
50.0000 mg | Freq: Two times a day (BID) | INTRAMUSCULAR | Status: DC
  Administered 2015-11-06 – 2015-11-07 (×2): 50 mg via INTRAVENOUS
  Filled 2015-11-06 (×2): qty 2

## 2015-11-06 NOTE — Care Management Important Message (Signed)
Important Message  Patient Details  Name: Deborah Murray MRN: [id number] Date of Birth: 20-Sep-1960   Medicare Important Message Given:  Yes    Sherald Barge, RN 11/06/2015, 10:37 AM

## 2015-11-06 NOTE — Progress Notes (Signed)
Deborah Murray  MRN: [id number]  DOB/AGE: Jul 03, 1960 55 y.o.  Primary Care Physician:[name], [name], MD  Admit date: 10/30/2015  Chief Complaint:  Chief Complaint  Patient presents with  . Abdominal Pain    S-Pt presented on  10/30/2015 with  Chief Complaint  Patient presents with  . Abdominal Pain  .    Pt today feels better   . budesonide  0.5 mg Nebulization BID  . citalopram  20 mg Oral QHS  . DULoxetine  30 mg Oral Daily  . feeding supplement  1 Container Oral BID BM  . gabapentin  600 mg Oral TID  . heparin subcutaneous  5,000 Units Subcutaneous Q8H  . hydrocortisone sod succinate (SOLU-CORTEF) inj  50 mg Intravenous Q12H  . latanoprost  1 drop Both Eyes QHS  . loratadine  10 mg Oral Daily  . metronidazole  500 mg Intravenous Q8H  . montelukast  10 mg Oral QHS  . nystatin  5 mL Oral QID  . saccharomyces boulardii  250 mg Oral BID  . tiotropium  1 capsule Inhalation Daily  . vancomycin  500 mg Oral Q6H      Physical Exam: Vital signs in last 24 hours: Temp:  [97.6 F (36.4 C)-98 F (36.7 C)] 97.7 F (36.5 C) (07/26 1305) Pulse Rate:  [32-133] 80 (07/26 0600) Resp:  [13-24] 14 (07/26 0600) BP: (102-141)/(62-112) 108/86 (07/26 0600) SpO2:  [34 %-100 %] 100 % (07/26 0722) Weight:  [286 lb 9.6 oz (130 kg)] 286 lb 9.6 oz (130 kg) (07/26 0500) Weight change: 0 lb (0 kg) Last BM Date: 11/06/15  Intake/Output from previous day: 07/25 0701 - 07/26 0700 In: 3323 [P.O.:1200; I.V.:1823; IV Piggyback:300] Out: 550 [Urine:550] No intake/output data recorded.   Physical Exam: General- pt is awake,alert, oriented to time place and person Resp- No acute REsp distress, CTA B/L NO Rhonchi CVS- S1S2 regular in rate and rhythm GIT- BS+, soft, NT, ND EXT- NO LE Edema, Cyanosis   Lab Results: CBC  Recent Labs  11/05/15 0440 11/06/15 0425  WBC 18.3* 18.2*  HGB 11.1* 11.2*  HCT 32.5* 32.7*  PLT 198 201   WBC trend 32=>24=>18  BMET  Recent Labs  11/05/15 1645 11/06/15 0425  NA 126* 129*  K 3.8 3.7  CL 101 101  CO2 20* 21*  GLUCOSE 135* 120*  BUN 37* 36*  CREATININE 1.85* 1.83*  CALCIUM 7.6* 7.5*   Creat trend 2017  1.3=>2.65=>1.85=>1.83 2016  1.2 2014  1.4 2013   1.3    MICRO Recent Results (from the past 240 hour(s))  Urine culture     Status: None   Collection Time: 10/30/15 12:38 PM  Result Value Ref Range Status   Specimen Description URINE, CATHETERIZED  Final   Special Requests NONE  Final   Culture NO GROWTH Performed at [facility]   Final   Report Status 10/31/2015 FINAL  Final  Culture, blood (routine x 2)     Status: None   Collection Time: 10/30/15 12:39 PM  Result Value Ref Range Status   Specimen Description BLOOD LEFT ANTECUBITAL  Final   Special Requests BOTTLES DRAWN AEROBIC AND ANAEROBIC 10CC EACH  Final   Culture NO GROWTH 5 DAYS  Final   Report Status 11/04/2015 FINAL  Final  Culture, blood (routine x 2)     Status: None   Collection Time: 10/30/15 12:53 PM  Result Value Ref Range Status   Specimen Description BLOOD RIGHT ANTECUBITAL  Final  Special Requests BOTTLES DRAWN AEROBIC AND ANAEROBIC 6CC EACH  Final   Culture NO GROWTH 5 DAYS  Final   Report Status 11/04/2015 FINAL  Final  Gastrointestinal Panel by PCR , Stool     Status: None   Collection Time: 10/31/15  2:21 AM  Result Value Ref Range Status   Campylobacter species NOT DETECTED NOT DETECTED Final   Plesimonas shigelloides NOT DETECTED NOT DETECTED Final   Salmonella species NOT DETECTED NOT DETECTED Final   Yersinia enterocolitica NOT DETECTED NOT DETECTED Final   Vibrio species NOT DETECTED NOT DETECTED Final   Vibrio cholerae NOT DETECTED NOT DETECTED Final   Enteroaggregative E coli (EAEC) NOT DETECTED NOT DETECTED Final   Enteropathogenic E coli (EPEC) NOT DETECTED NOT DETECTED Final   Enterotoxigenic E coli (ETEC) NOT DETECTED NOT DETECTED Final   Shiga like toxin producing E coli (STEC) NOT DETECTED NOT  DETECTED Final   E. coli O157 NOT DETECTED NOT DETECTED Final   Shigella/Enteroinvasive E coli (EIEC) NOT DETECTED NOT DETECTED Final   Cryptosporidium NOT DETECTED NOT DETECTED Final   Cyclospora cayetanensis NOT DETECTED NOT DETECTED Final   Entamoeba histolytica NOT DETECTED NOT DETECTED Final   Giardia lamblia NOT DETECTED NOT DETECTED Final   Adenovirus F40/41 NOT DETECTED NOT DETECTED Final   Astrovirus NOT DETECTED NOT DETECTED Final   Norovirus GI/GII NOT DETECTED NOT DETECTED Final   Rotavirus A NOT DETECTED NOT DETECTED Final   Sapovirus (I, II, IV, and V) NOT DETECTED NOT DETECTED Final  C difficile quick scan w PCR reflex     Status: Abnormal   Collection Time: 10/31/15  2:21 AM  Result Value Ref Range Status   C Diff antigen POSITIVE (A) NEGATIVE Final   C Diff toxin POSITIVE (A) NEGATIVE Final   C Diff interpretation Toxin producing C. difficile detected.  Final    Comment: CRITICAL RESULT CALLED TO, READ BACK BY AND VERIFIED WITH: Marylouise Stacks AT [id number] ON [id number] BY FORSYTH K   Stool culture (children & immunocomp patients)     Status: None   Collection Time: 10/31/15  2:21 AM  Result Value Ref Range Status   Salmonella/Shigella Screen Final report  Final   Campylobacter Culture Final report  Final   E coli, Shiga toxin Assay Negative Negative Final    Comment: (NOTE) Performed At: [facility] [place], [place] [id number] Lindon Romp MD [id number]   STOOL CULTURE REFLEX - RSASHR     Status: None   Collection Time: 10/31/15  2:21 AM  Result Value Ref Range Status   Stool Culture result 1 (RSASHR) Comment  Final    Comment: (NOTE) No Salmonella or Shigella recovered. Performed At: [facility] [address] [place], [place] [id number] Lindon Romp MD [id number]   STOOL CULTURE Reflex - CMPCXR     Status: None   Collection Time: 10/31/15  2:21 AM  Result Value Ref Range Status   Stool Culture result 1 (CMPCXR) Comment   Final    Comment: (NOTE) No Campylobacter species isolated. Performed At: [facility] [place], [place] [id number] Lindon Romp MD [id number]   Culture, Urine     Status: None   Collection Time: 11/02/15  4:02 PM  Result Value Ref Range Status   Specimen Description URINE, CATHETERIZED  Final   Special Requests Immunocompromised  Final   Culture NO GROWTH Performed at [facility]   Final   Report Status 11/03/2015  FINAL  Final  MRSA PCR Screening     Status: None   Collection Time: 11/03/15 12:10 PM  Result Value Ref Range Status   MRSA by PCR NEGATIVE NEGATIVE Final    Comment:        The GeneXpert MRSA Assay (FDA approved for NASAL specimens only), is one component of a comprehensive MRSA colonization surveillance program. It is not intended to diagnose MRSA infection nor to guide or monitor treatment for MRSA infections.       Lab Results  Component Value Date   CALCIUM 7.5 (L) 11/06/2015   CAION 1.14 10/24/2011   PHOS 4.2 11/06/2015               Impression: 1)Renal  AKI secondary to ATN                AKI sec to Hypovolemia/HYpotension/ ACE as outpt/ HCTZ as outpt                AKI on CKD               CKD stage 3.               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      Dainelle Hun S 11/06/2015, 2:35 PM

## 2015-11-06 NOTE — Progress Notes (Addendum)
PROGRESS NOTE    Deborah Murray  [id number] DOB: 1961-04-13 DOA: 10/30/2015 PCP: Sol Passer, MD  Outpatient Specialists:  Gastroenterology: Daneil Dolin, MD    Brief Narrative: 57 yof with hx of A-fib, HTN, severe asthma/allergies, adrenal insufficiency, and recent back surgery, presented with complaints of abd pain and diarrhea that onset 2 weeks ago from her back surgery. She reported having loose stool once she arrived at home. Also, she reported having a fever and feeling very weak at home. While in the ED, temperature 102.3, BP normal, and WBC 23.7. CT abd/pelvis showed diffuse edematous wall thickening in the colon, from the cecal tip to the rectum associated with diffuse pericolonic edema/ inflammation which is compatible with pancolitis. C. Diff toxin positive. GI pathogen panel negative. Follow up CT abd/pelvis showed worsening pancolitis. Surgery and GI were consulted. Nephrology was consulted for her acute renal failure. She will need several more days of treatment.   Assessment & Plan:   Principal Problem:   Pancolitis ([place]) Active Problems:   Hypokalemia   Essential hypertension   Severe persistent asthma   Morbid obesity (H[facility])   C. difficile diarrhea   Hypotension   History of atrial fibrillation- NSR now   History of adrenal insufficiency   History of hypertension   C. difficile colitis   Metabolic acidosis   Hypocalcemia   Acute kidney injury ([place])   Hyponatremia   Sepsis (H[facility])   AKI (acute kidney injury) ([place])   Pyrexia   Protein-calorie malnutrition, severe   1. Pancolitis secondary to C. Diff colitis. C. Diff PCR positive. GI pathogen negative. General surgery consult appreciated. It was not felt that surgical intervention was necessary at this time. GI consult appreciated. Pt. continues to require rectal tube. Continue oral vanc and IV Flagyl. Will add florestor. 2. Essential HTN, was hypotensive on admission. Outpatient meds were held.  Hypotension resolved with IV fluids. She briefly require neo-senephrine but has been weaned off. Continue IV Fluids.  3. Hyponatremia likely hypovalemic. Sodium remains low. Continue lasix 4. AKI. Creatinine from today is still pending although it has been improving over the last few days. Nephrology has been consulted. Continue to monitor in the setting of IV fluid and lasix. Currently on lasix infusion. 5. Hypokalemia. Resolved 6. Hypomagnesemia. Within normal limits 7. Hypocalcemia. When calcium is corrected for hypoalbunemia   Calcium is 8.7 which is approaching normal range. Continue calcium supplementation. 8. Severe malnutrition in the context of acute illness/injury, Morbid obesity. Nutrition following.   DVT prophylaxis: SQ heparin Code Status: Full Family Communication: No family at bedside Disposition Plan: Discharge once improved    Consultants:   GI  Nutrition   General surgery  Procedures:   None  Antimicrobials:   Vanc   7/23 >>>  Metronidazole  7/19 >>>   Subjective: Pt denies vomiting. She is currently breathing well. She reports generalized abd pain. Diarrhea is still persistent.  Objective: Vitals:   11/06/15 0100 11/06/15 0200 11/06/15 0300 11/06/15 0400  BP: (!) 136/111 (!) 141/112 110/62 (!) 126/98  Pulse: 84 86 82 83  Resp: 13 13 13 13   Temp:      TempSrc:      SpO2: 99% 99% 100% 100%  Weight:      Height:        Intake/Output Summary (Last 24 hours) at 11/06/15 0610 Last data filed at 11/06/15 0300  Gross per 24 hour  Intake  2826 ml  Output                0 ml  Net             2826 ml   Filed Weights   11/03/15 0600 11/04/15 0500 11/05/15 0500  Weight: 126.1 kg (278 lb) 128.3 kg (282 lb 13.6 oz) 130 kg (286 lb 9.6 oz)    Examination:  General exam: Appears calm and comfortable  Respiratory system: Clear to auscultation. Respiratory effort normal. Cardiovascular system: S1 & S2 heard, RRR. No JVD, murmurs, rubs,  gallops or clicks. 2+ pedal edema. Gastrointestinal system: Abdomen soft and diffusely tender. No organomegaly or masses felt. Bowel sounds are sluggish. Central nervous system: Alert and oriented. No focal neurological deficits. Extremities: Symmetric 5 x 5 power. Skin: No rashes, lesions or ulcers Psychiatry: Judgement and insight appear normal. Mood & affect appropriate.     Data Reviewed:  CBC:  Recent Labs Lab 10/30/15 1057  11/02/15 0623 11/03/15 0806 11/04/15 0439 11/05/15 0440 11/06/15 0425  WBC 23.7*  < > 28.6* 32.2* 24.4* 18.3* 18.2*  NEUTROABS 21.9*  --   --   --   --   --   --   HGB 10.8*  < > 10.6* 12.5 10.8* 11.1* 11.2*  HCT 32.6*  < > 32.0* 36.9 32.2* 32.5* 32.7*  MCV 85.6  < > 86.0 82.6 83.9 84.2 82.8  PLT 493*  < > 267 272 235 198 201  < > = values in this interval not displayed. Basic Metabolic Panel:  Recent Labs Lab 11/02/15 0623 11/02/15 1245 11/02/15 2252 11/03/15 0328 11/03/15 0806 11/04/15 0439 11/04/15 1627 11/05/15 0440 11/05/15 1645  NA  --  135 130*  --  131* 130* 126*  --  126*  K  --  2.5* 4.5  --  4.1 4.4 4.5  --  3.8  CL  --  119* 105  --  104 102 99*  --  101  CO2  --  12* 20*  --  19* 19* 19*  --  20*  GLUCOSE  --  107* 117*  --  119* 131* 130*  --  135*  BUN  --  27* 36*  --  37* 38* 37*  --  37*  CREATININE  --  1.81* 2.46*  --  2.10* 1.95* 1.93*  --  1.85*  CALCIUM  --  4.1* 6.5*  --  6.8* 7.9* 7.9*  --  7.6*  MG  --  1.1*  --  2.1 2.1 2.1  --  2.0  --   PHOS 1.5*  --   --   --   --  4.2 4.4  --  4.3   GFR: Estimated Creatinine Clearance: 43.5 mL/min (by C-G formula based on SCr of 1.85 mg/dL). Liver Function Tests:  Recent Labs Lab 10/30/15 1239 11/01/15 2038 11/04/15 0439 11/04/15 1627 11/05/15 1645  AST 16 26 23   --   --   ALT 13* 14 10*  --   --   ALKPHOS 88 79 62  --   --   BILITOT 1.1 0.4 0.3  --   --   PROT 4.8* 4.6* 4.3*  --   --   ALBUMIN 2.1* 1.9* 2.6* 2.9* 2.6*    Recent Labs Lab 10/30/15 1057    LIPASE 9*   No results for input(s): AMMONIA in the last 168 hours. Coagulation Profile: No results for input(s): INR, PROTIME in the last 168 hours. Cardiac  Enzymes:  Recent Labs Lab 10/30/15 1239 11/03/15 0957  CKTOTAL  --  36*  TROPONINI <0.03  --    BNP (last 3 results) No results for input(s): PROBNP in the last 8760 hours. HbA1C: No results for input(s): HGBA1C in the last 72 hours. CBG: No results for input(s): GLUCAP in the last 168 hours. Lipid Profile: No results for input(s): CHOL, HDL, LDLCALC, TRIG, CHOLHDL, LDLDIRECT in the last 72 hours. Thyroid Function Tests: No results for input(s): TSH, T4TOTAL, FREET4, T3FREE, THYROIDAB in the last 72 hours. Anemia Panel: No results for input(s): VITAMINB12, FOLATE, FERRITIN, TIBC, IRON, RETICCTPCT in the last 72 hours. Urine analysis:    Component Value Date/Time   COLORURINE BROWN (A) 11/02/2015 0525   APPEARANCEUR CLOUDY (A) 11/02/2015 0525   LABSPEC 1.025 11/02/2015 0525   PHURINE 5.0 11/02/2015 0525   GLUCOSEU 100 (A) 11/02/2015 0525   HGBUR LARGE (A) 11/02/2015 0525   BILIRUBINUR MODERATE (A) 11/02/2015 0525   KETONESUR 15 (A) 11/02/2015 0525   PROTEINUR 100 (A) 11/02/2015 0525   UROBILINOGEN 1.0 05/12/2014 1319   NITRITE POSITIVE (A) 11/02/2015 0525   LEUKOCYTESUR TRACE (A) 11/02/2015 0525   Sepsis Labs: @LABRCNTIP (procalcitonin:4,lacticidven:4)  ) Recent Results (from the past 240 hour(s))  Urine culture     Status: None   Collection Time: 10/30/15 12:38 PM  Result Value Ref Range Status   Specimen Description URINE, CATHETERIZED  Final   Special Requests NONE  Final   Culture NO GROWTH Performed at [facility]   Final   Report Status 10/31/2015 FINAL  Final  Culture, blood (routine x 2)     Status: None   Collection Time: 10/30/15 12:39 PM  Result Value Ref Range Status   Specimen Description BLOOD LEFT ANTECUBITAL  Final   Special Requests BOTTLES DRAWN AEROBIC AND ANAEROBIC 10CC EACH   Final   Culture NO GROWTH 5 DAYS  Final   Report Status 11/04/2015 FINAL  Final  Culture, blood (routine x 2)     Status: None   Collection Time: 10/30/15 12:53 PM  Result Value Ref Range Status   Specimen Description BLOOD RIGHT ANTECUBITAL  Final   Special Requests BOTTLES DRAWN AEROBIC AND ANAEROBIC 6CC EACH  Final   Culture NO GROWTH 5 DAYS  Final   Report Status 11/04/2015 FINAL  Final  Gastrointestinal Panel by PCR , Stool     Status: None   Collection Time: 10/31/15  2:21 AM  Result Value Ref Range Status   Campylobacter species NOT DETECTED NOT DETECTED Final   Plesimonas shigelloides NOT DETECTED NOT DETECTED Final   Salmonella species NOT DETECTED NOT DETECTED Final   Yersinia enterocolitica NOT DETECTED NOT DETECTED Final   Vibrio species NOT DETECTED NOT DETECTED Final   Vibrio cholerae NOT DETECTED NOT DETECTED Final   Enteroaggregative E coli (EAEC) NOT DETECTED NOT DETECTED Final   Enteropathogenic E coli (EPEC) NOT DETECTED NOT DETECTED Final   Enterotoxigenic E coli (ETEC) NOT DETECTED NOT DETECTED Final   Shiga like toxin producing E coli (STEC) NOT DETECTED NOT DETECTED Final   E. coli O157 NOT DETECTED NOT DETECTED Final   Shigella/Enteroinvasive E coli (EIEC) NOT DETECTED NOT DETECTED Final   Cryptosporidium NOT DETECTED NOT DETECTED Final   Cyclospora cayetanensis NOT DETECTED NOT DETECTED Final   Entamoeba histolytica NOT DETECTED NOT DETECTED Final   Giardia lamblia NOT DETECTED NOT DETECTED Final   Adenovirus F40/41 NOT DETECTED NOT DETECTED Final   Astrovirus NOT DETECTED NOT DETECTED  Final   Norovirus GI/GII NOT DETECTED NOT DETECTED Final   Rotavirus A NOT DETECTED NOT DETECTED Final   Sapovirus (I, II, IV, and V) NOT DETECTED NOT DETECTED Final  C difficile quick scan w PCR reflex     Status: Abnormal   Collection Time: 10/31/15  2:21 AM  Result Value Ref Range Status   C Diff antigen POSITIVE (A) NEGATIVE Final   C Diff toxin POSITIVE (A) NEGATIVE  Final   C Diff interpretation Toxin producing C. difficile detected.  Final    Comment: CRITICAL RESULT CALLED TO, READ BACK BY AND VERIFIED WITH: Marylouise Stacks AT [id number] ON [id number] BY FORSYTH K   Stool culture (children & immunocomp patients)     Status: None   Collection Time: 10/31/15  2:21 AM  Result Value Ref Range Status   Salmonella/Shigella Screen Final report  Final   Campylobacter Culture Final report  Final   E coli, Shiga toxin Assay Negative Negative Final    Comment: (NOTE) Performed At: [facility] [place], [place] [id number] Lindon Romp MD [id number]   STOOL CULTURE REFLEX - RSASHR     Status: None   Collection Time: 10/31/15  2:21 AM  Result Value Ref Range Status   Stool Culture result 1 (RSASHR) Comment  Final    Comment: (NOTE) No Salmonella or Shigella recovered. Performed At: [facility] [address] [place], [place] [id number] Lindon Romp MD [id number]   STOOL CULTURE Reflex - CMPCXR     Status: None   Collection Time: 10/31/15  2:21 AM  Result Value Ref Range Status   Stool Culture result 1 (CMPCXR) Comment  Final    Comment: (NOTE) No Campylobacter species isolated. Performed At: [facility] [place], [place] [id number] Lindon Romp MD [id number]   Culture, Urine     Status: None   Collection Time: 11/02/15  4:02 PM  Result Value Ref Range Status   Specimen Description URINE, CATHETERIZED  Final   Special Requests Immunocompromised  Final   Culture NO GROWTH Performed at [facility]   Final   Report Status 11/03/2015 FINAL  Final  MRSA PCR Screening     Status: None   Collection Time: 11/03/15 12:10 PM  Result Value Ref Range Status   MRSA by PCR NEGATIVE NEGATIVE Final    Comment:        The GeneXpert MRSA Assay (FDA approved for NASAL specimens only), is one component of a comprehensive MRSA colonization surveillance program. It is not intended to  diagnose MRSA infection nor to guide or monitor treatment for MRSA infections.          Radiology Studies: Dg Abd Portable 1v  Result Date: 11/04/2015 CLINICAL DATA:  Colitis EXAM: PORTABLE ABDOMEN - 1 VIEW COMPARISON:  CT 11/02/2015 FINDINGS: Nonobstructive bowel gas pattern. No visible free air on these supine views. No organomegaly or suspicious calcification. Prior fusion in the lower lumbar spine. IMPRESSION: No evidence of bowel obstruction or free air. Electronically Signed   By: Rolm Baptise M.D.   On: 11/04/2015 12:09       Scheduled Meds: . budesonide  0.5 mg Nebulization BID  . citalopram  20 mg Oral QHS  . DULoxetine  30 mg Oral Daily  . feeding supplement  1 Container Oral BID BM  . gabapentin  600 mg Oral TID  . heparin subcutaneous  5,000 Units Subcutaneous Q8H  . hydrocortisone sod succinate (SOLU-CORTEF) inj  50 mg Intravenous Q8H  . latanoprost  1 drop Both Eyes QHS  . loratadine  10 mg Oral Daily  . metronidazole  500 mg Intravenous Q8H  . montelukast  10 mg Oral QHS  . nystatin  5 mL Oral QID  . tiotropium  1 capsule Inhalation Daily  . vancomycin  500 mg Oral Q6H   Continuous Infusions: . dextrose 5 % and 0.45% NaCl 1,000 mL with sodium bicarbonate 50 mEq infusion 125 mL/hr at 11/06/15 0454  . furosemide (LASIX) infusion 8 mg/hr (11/06/15 0300)     LOS: 7 days    Time spent: 25 minutes    Merry Proud, MD [facility]  If 7PM-7AM, please contact night-coverage [email or web address] Password [email or web address] 11/06/2015, 6:10 AM   By signing my name below, I, Hilbert Odor, attest that this documentation has been prepared under the direction and in the presence of Kathie Dike, MD. Electronically signed: Hilbert Odor, Scribe.  11/06/15, 8:45 AM   I, Kathie Dike, personally performed the services described in this documentation. All medical record entries made by the scribe were at my discretion and in my presence. Kathie Dike,  MD

## 2015-11-06 NOTE — Progress Notes (Signed)
Subjective:  Feels a lot better. Per nursing, her stool output has decreased, no longer coming around the tube. Patient states abdominal pain better this morning. Tolerating mechanical soft diet.   Objective: Vital signs in last 24 hours: Temp:  [97 F (36.1 C)-98 F (36.7 C)] 98 F (36.7 C) (07/26 0400) Pulse Rate:  [32-133] 80 (07/26 0600) Resp:  [13-24] 14 (07/26 0600) BP: (102-141)/(62-112) 108/86 (07/26 0600) SpO2:  [34 %-100 %] 100 % (07/26 0722) Weight:  [286 lb 9.6 oz (130 kg)] 286 lb 9.6 oz (130 kg) (07/26 0500) Last BM Date: 11/05/15 General:   Alert,  Well-developed, well-nourished, pleasant and cooperative in NAD Head:  Normocephalic and atraumatic. Eyes:  Sclera clear, no icterus.  Abdomen:  Soft, nontender and nondistended.  Normal bowel sounds, without guarding, and without rebound.   Extremities:  Without clubbing, deformity. 2+edema bilaterally. Neurologic:  Alert and  oriented x4;  grossly normal neurologically. Skin:  Intact without significant lesions or rashes. Psych:  Alert and cooperative. Normal mood and affect.  Intake/Output from previous day: 07/25 0701 - 07/26 0700 In: 3323 [P.O.:1200; I.V.:1823; IV Piggyback:300] Out: 550 [Urine:550] Intake/Output this shift: No intake/output data recorded.  Lab Results: CBC  Recent Labs  11/04/15 0439 11/05/15 0440 11/06/15 0425  WBC 24.4* 18.3* 18.2*  HGB 10.8* 11.1* 11.2*  HCT 32.2* 32.5* 32.7*  MCV 83.9 84.2 82.8  PLT 235 198 201   BMET  Recent Labs  11/04/15 0439 11/04/15 1627 11/05/15 1645  NA 130* 126* 126*  K 4.4 4.5 3.8  CL 102 99* 101  CO2 19* 19* 20*  GLUCOSE 131* 130* 135*  BUN 38* 37* 37*  CREATININE 1.95* 1.93* 1.85*  CALCIUM 7.9* 7.9* 7.6*   LFTs  Recent Labs  11/04/15 0439 11/04/15 1627 11/05/15 1645  BILITOT 0.3  --   --   ALKPHOS 62  --   --   AST 23  --   --   ALT 10*  --   --   PROT 4.3*  --   --   ALBUMIN 2.6* 2.9* 2.6*   No results for input(s): LIPASE in the  last 72 hours. PT/INR No results for input(s): LABPROT, INR in the last 72 hours.    Imaging Studies: Ct Abdomen Pelvis Wo Contrast  Result Date: 11/02/2015 CLINICAL DATA:  C difficile colitis.  Abdominal pain EXAM: CT ABDOMEN AND PELVIS WITHOUT CONTRAST TECHNIQUE: Multidetector CT imaging of the abdomen and pelvis was performed following the standard protocol without IV contrast. COMPARISON:  10/30/2015 FINDINGS: Lower chest and abdominal wall: Small pericardial effusion. Small pleural effusions. Aortic atherosclerosis, notable for age. Hepatobiliary: No focal liver abnormality.Cholecystectomy. No suspected bile duct dilatation. Pancreas: Unremarkable. Spleen: Unremarkable. Adrenals/Urinary Tract: Negative adrenals. No hydronephrosis or stone. The bladder is decompressed by Foley catheter. Stomach/Bowel: Diffuse marked colonic wall thickening and mesocolonic fat edema has worsened. Inflammation is pancolonic. The colon is more distended than previously. No visible pneumatosis or perforation. No bowel obstruction. No focal pericecal inflammation. Status post gastric bypass. Reproductive:No pathologic findings. Vascular/Lymphatic: No acute vascular abnormality. No mass or adenopathy. Other: No pneumoperitoneum. Musculoskeletal: L4-5 discectomy. Interbody fusion is questionable but there is likely posterior-lateral solid bone. Remote L1 superior endplate fracture. Spondylosis. These results were called by telephone at the time of interpretation on 11/02/2015 at 9:17 pm to Dr Marin Comment, who verbally acknowledged these results. IMPRESSION: 1. Worsening C difficile pancolitis. The colon is more distended than previously, concerning for developing toxic megacolon. No perforation. 2. New small  ascites and pleural effusions. Electronically Signed   By: Monte Fantasia M.D.   On: 11/02/2015 21:35   Ct Abdomen Pelvis Wo Contrast  Result Date: 10/30/2015 CLINICAL DATA:  Nausea with vomiting and diarrhea. Abdominal pain for  1 week. EXAM: CT ABDOMEN AND PELVIS WITHOUT CONTRAST TECHNIQUE: Multidetector CT imaging of the abdomen and pelvis was performed following the standard protocol without IV contrast. COMPARISON:  None. FINDINGS: Lower chest:  Bilateral dependent atelectasis. Hepatobiliary: No focal abnormality in the liver on this study without intravenous contrast. No evidence of hepatomegaly. Gallbladder surgically absent. No intrahepatic or extrahepatic biliary dilation. Pancreas: No focal mass lesion. No dilatation of the main duct. No intraparenchymal cyst. No peripancreatic edema. Spleen: No splenomegaly. No focal mass lesion. Adrenals/Urinary Tract: No adrenal nodule or mass. No hydronephrosis in either kidney. Uninfused imaging shows no focal abnormality of either kidney. No hydroureter. Foley catheter decompresses the urinary bladder. Stomach/Bowel: Patient is status post gastric bypass surgery. Duodenum is normally positioned as is the ligament of Treitz. No small bowel wall thickening. No small bowel dilatation. Terminal ileum unremarkable. Appendix not well seen. Marked circumferential wall thickening and edema is seen in the colon diffusely, from the cecal tip to the level of the rectum. This is associated with diffuse pericolonic edema/ inflammation. Vascular/Lymphatic: There is abdominal aortic atherosclerosis without aneurysm. There is no gastrohepatic or hepatoduodenal ligament lymphadenopathy. No intraperitoneal or retroperitoneal lymphadenopathy. No pelvic sidewall lymphadenopathy. Reproductive: The uterus is surgically absent. There is no adnexal mass. Other: No substantial intraperitoneal free fluid. Musculoskeletal: Patient is status post lower lumbar fusion. Bone windows reveal no worrisome lytic or sclerotic osseous lesions. IMPRESSION: Diffuse edematous wall thickening in the colon, from the cecal tip to the rectum associated with diffuse pericolonic edema/ inflammation. Imaging features are compatible with  infectious/inflammatory pancolitis. Electronically Signed   By: Misty Stanley M.D.   On: 10/30/2015 17:03   US Renal  Result Date: 11/03/2015 CLINICAL DATA:  Acute kidney injury, UTI EXAM: RENAL / URINARY TRACT ULTRASOUND COMPLETE COMPARISON:  CT abdomen pelvis dated 11/02/2015 FINDINGS: Right Kidney: Length: 10.5 cm.  No mass or hydronephrosis. Left Kidney: Length: 10.3 cm.  No mass or hydronephrosis. Bladder: Poorly visualized/underdistended. IMPRESSION: No hydronephrosis. Electronically Signed   By: Julian Hy M.D.   On: 11/03/2015 11:58  Dg Chest Port 1 View  Result Date: 10/30/2015 CLINICAL DATA:  Abdominal pain with nausea vomiting and diarrhea. EXAM: PORTABLE CHEST 1 VIEW COMPARISON:  07/06/2015. FINDINGS: 1249 hours. Lung volumes are low. The lungs are clear wiithout focal pneumonia, edema, pneumothorax or pleural effusion. Cardiopericardial silhouette is at upper limits of normal for size. The visualized bony structures of the thorax are intact. Telemetry leads overlie the chest. IMPRESSION: Low volume film without acute cardiopulmonary findings. Electronically Signed   By: Misty Stanley M.D.   On: 10/30/2015 13:00   Dg Abd Portable 1v  Result Date: 11/04/2015 CLINICAL DATA:  Colitis EXAM: PORTABLE ABDOMEN - 1 VIEW COMPARISON:  CT 11/02/2015 FINDINGS: Nonobstructive bowel gas pattern. No visible free air on these supine views. No organomegaly or suspicious calcification. Prior fusion in the lower lumbar spine. IMPRESSION: No evidence of bowel obstruction or free air. Electronically Signed   By: Rolm Baptise M.D.   On: 11/04/2015 12:09 [2 weeks]  No evidence of bowel obstruction on abd film   Assessment: 56 year old female with severe/complicated Cdiff colitis that has been slowly responding to therapy. Notes improved abdominal pain. Alert, conversant, and smiling during conversation. Appears that clinically, she  is much improved from admission, with leukocytosis improving, renal  function improving. Stool output appears to be decreasing, no longer leaking from around the rectal tube. Recommend strict I/Os if at all possible. Appreciate nephrology recommendations and support.   Anasarca: in setting of hypoalbuminemia, acute illness.   Adrenal insufficiency: on IV steroids.   Acute renal injury: nephrology on board.  Oral thrush: nystatin swish and swallow.   Plan: 1. Continue 7 more days of Flagyl. 2. Continue vancomyin 500mg  orally QID for 7 more days, then 500mg  TID for 5 days, 500mg  BID for 5 days, 500mg  daily for 5 days.  3. Continue Nystatin. 4. Continue to monitor leukocytosis.   Laureen Ochs. Bernarda Caffey [facility] [phone] 7/26/201710:08 AM      LOS: 7 days

## 2015-11-07 ENCOUNTER — Inpatient Hospital Stay (HOSPITAL_COMMUNITY): Payer: Medicare Other

## 2015-11-07 LAB — CBC
HEMATOCRIT: 32.9 % — AB (ref 36.0–46.0)
Hemoglobin: 11.5 g/dL — ABNORMAL LOW (ref 12.0–15.0)
MCH: 28.8 pg (ref 26.0–34.0)
MCHC: 35 g/dL (ref 30.0–36.0)
MCV: 82.3 fL (ref 78.0–100.0)
PLATELETS: 191 10*3/uL (ref 150–400)
RBC: 4 MIL/uL (ref 3.87–5.11)
RDW: 15.7 % — AB (ref 11.5–15.5)
WBC: 19.1 10*3/uL — AB (ref 4.0–10.5)

## 2015-11-07 LAB — RENAL FUNCTION PANEL
Albumin: 2.5 g/dL — ABNORMAL LOW (ref 3.5–5.0)
Anion gap: 10 (ref 5–15)
BUN: 32 mg/dL — AB (ref 6–20)
CHLORIDE: 101 mmol/L (ref 101–111)
CO2: 22 mmol/L (ref 22–32)
CREATININE: 1.6 mg/dL — AB (ref 0.44–1.00)
Calcium: 7.6 mg/dL — ABNORMAL LOW (ref 8.9–10.3)
GFR calc Af Amer: 40 mL/min — ABNORMAL LOW (ref >60)
GFR calc non Af Amer: 35 mL/min — ABNORMAL LOW (ref >60)
GLUCOSE: 113 mg/dL — AB (ref 65–99)
POTASSIUM: 2.8 mmol/L — AB (ref 3.5–5.1)
Phosphorus: 3.7 mg/dL (ref 2.5–4.6)
Sodium: 133 mmol/L — ABNORMAL LOW (ref 135–145)

## 2015-11-07 MED ORDER — POTASSIUM CHLORIDE CRYS ER 20 MEQ PO TBCR
40.0000 meq | EXTENDED_RELEASE_TABLET | Freq: Two times a day (BID) | ORAL | Status: DC
  Administered 2015-11-07 – 2015-11-14 (×14): 40 meq via ORAL
  Filled 2015-11-07 (×14): qty 2

## 2015-11-07 MED ORDER — ALUM & MAG HYDROXIDE-SIMETH 200-200-20 MG/5ML PO SUSP
15.0000 mL | Freq: Four times a day (QID) | ORAL | Status: DC | PRN
  Administered 2015-11-07 – 2015-11-19 (×2): 15 mL via ORAL
  Filled 2015-11-07 (×2): qty 30

## 2015-11-07 MED ORDER — PRO-STAT SUGAR FREE PO LIQD
30.0000 mL | Freq: Two times a day (BID) | ORAL | Status: DC
  Administered 2015-11-07 – 2015-11-17 (×13): 30 mL via ORAL
  Administered 2015-11-18: via ORAL
  Administered 2015-11-19: 30 mL via ORAL
  Filled 2015-11-07 (×17): qty 30

## 2015-11-07 MED ORDER — ONDANSETRON HCL 4 MG/2ML IJ SOLN
4.0000 mg | Freq: Three times a day (TID) | INTRAMUSCULAR | Status: DC
  Administered 2015-11-07 – 2015-11-08 (×3): 4 mg via INTRAVENOUS
  Filled 2015-11-07 (×3): qty 2

## 2015-11-07 MED ORDER — PREDNISONE 20 MG PO TABS
40.0000 mg | ORAL_TABLET | Freq: Every day | ORAL | Status: DC
  Administered 2015-11-07: 40 mg via ORAL
  Filled 2015-11-07: qty 2

## 2015-11-07 MED ORDER — POTASSIUM CHLORIDE 10 MEQ/100ML IV SOLN
10.0000 meq | INTRAVENOUS | Status: AC
  Administered 2015-11-07 (×3): 10 meq via INTRAVENOUS
  Filled 2015-11-07 (×3): qty 100

## 2015-11-07 MED ORDER — HYDRALAZINE HCL 25 MG PO TABS
25.0000 mg | ORAL_TABLET | Freq: Four times a day (QID) | ORAL | Status: DC | PRN
  Administered 2015-11-07: 25 mg via ORAL
  Filled 2015-11-07: qty 1

## 2015-11-07 NOTE — Progress Notes (Signed)
Subjective: Interval History: Patient feeling much better. She'll first note new complaints. She doesn't have any nausea or vomiting.  Objective: Vital signs in last 24 hours: Temp:  [97 F (36.1 C)-97.7 F (36.5 C)] 97.7 F (36.5 C) (07/27 0400) Pulse Rate:  [83-97] 83 (07/27 0600) Resp:  [13-18] 15 (07/27 0600) BP: (124-145)/(90-110) 134/105 (07/27 0600) SpO2:  [96 %-100 %] 100 % (07/27 0600) Weight:  [132.4 kg (291 lb 14.2 oz)] 132.4 kg (291 lb 14.2 oz) (07/27 0500) Weight change: 2.4 kg (5 lb 4.7 oz)  Intake/Output from previous day: 07/26 0701 - 07/27 0700 In: 2036 [P.O.:240; I.V.:1596; IV Piggyback:200] Out: 1100 [Urine:1100] Intake/Output this shift: No intake/output data recorded.  General appearance: alert, cooperative and no distress Resp: clear to auscultation bilaterally Cardio: regular rate and rhythm, S1, S2 normal, no murmur, click, rub or gallop GI: Patient with some abdominal tenderness but no rebound tenderness. Positive bowel sounds Extremities: edema 1-2+ edema  Lab Results:  Recent Labs  11/06/15 0425 11/07/15 0410  WBC 18.2* 19.1*  HGB 11.2* 11.5*  HCT 32.7* 32.9*  PLT 201 191   BMET:   Recent Labs  11/06/15 0425 11/07/15 0410  NA 129* 133*  K 3.7 2.8*  CL 101 101  CO2 21* 22  GLUCOSE 120* 113*  BUN 36* 32*  CREATININE 1.83* 1.60*  CALCIUM 7.5* 7.6*   No results for input(s): PTH in the last 72 hours. Iron Studies: No results for input(s): IRON, TIBC, TRANSFERRIN, FERRITIN in the last 72 hours.  Studies/Results: No results found.  I have reviewed the patient's current medications.  Assessment/Plan: Problem #1 acute kidney injury: Her renal function is progressively improving. Presently she is asymptomatic. Problem #2 . Pancolitis: Still patient continue with diarrhea. Presently her blood pressure is better but her white blood cell count is increasing. Problem #3 history of asthma: She is on inhalers. Patient does not have any  complaints.  Problem #4 low CO2: Respiratory alkalosis. has corrected. Problem #5 hypophosphatemia: Her phosphorus is normal Problem #6 hypokalemia: . Potassium has declined significantly today. Most likely secondary to Lasix.  Problem #7 hypocalcemia on calcium supplement. Her calcium is normal. Problem #8 anasarca: patient on Lasix drip she had 1100 mL of urine output seems to be reasonable. Problem #9 hypotension: Her blood pressure is much better. Plan: 1] we'll continue his Lasix 2] we'll give Lasix 10 mg IV over 1 hour 3 doses 3] we'll start her on KCl 40 mEq by mouth twice a day  4] we'll check her renal panel in the morning   LOS: 8 days   Tanav Orsak S 11/07/2015,7:27 AM

## 2015-11-07 NOTE — Progress Notes (Signed)
Nutrition Follow-up  DOCUMENTATION CODES:  Severe malnutrition in context of acute illness/injury, Morbid obesity   INTERVENTION:  D/C Boost Breeze  Will order 30 mL Prostat BID, each supplement provides 100 kcal and 15 grams of protein.  NUTRITION DIAGNOSIS:  Inadequate oral intake related to nausea, acute illness and diarrhea evidenced by an estimated energy intake that met < or equal to 50% of needs for > or equal to 5 days  -slightly improved  GOAL:  Patient will meet greater than or equal to 90% of their needs   -not met  MONITOR:  PO intake, Supplement acceptance, Diet advancement, Labs, Weight trends, I & O's   ASSESSMENT:  55 y/o female history obesity, DJD, allergies, HTN, HLD, chronic pain, afib, recent back surgery. Presents with 2 week history of abdominal pain and diarrhea which began after recent back surgery. CT in ED showed diffuse pancolitis. Admitted for management. Work up is positive for CDIFF  Interval history as of 7/25: Pt's diet was advanced to Full liquid on 7/25. She became acidotic in metabolic setting. Was found to have UTI. Nephrology consulted due to rising creatinine/aki. Her diarrhea has not improved. Developed peripheral edema. CT on 7/22 showed worsening inflammation, potentially megacolon. Transferred to ICU and diet downgraded to CL on 7/23. Continuous to have high stool output.   Interval history as of 7/27: Diet advanced to soft on 7/25, had ~1 days of fair-good meal intake on 7/25. Patient moved to RNF. Stool output decreased. Feels better overall  Today, pt is seen with untouched Lunch tray in front of her. She says her stomach pain is worse than it was the day prior. RD asked if there was anything at all she thought she could tolerate or wanted and pt stated "I dont want to lie to you, no". She also does not like the Boost Breeze due to sweetness. RD did go over importance of nutrition in her recovery and did ask her to consider a different oral  supplement. RD gave choices. She agreed to Prostat 30 ml BID.   Weight continues to increase.   Even though her stomach pain is worse, she states her diarrhea is much improved and overall does feel better.   Labs reviewed: CDIFF positive, WBC: 19.1, albumin: 2.5 (decreased), total pro: 4.3. CBGs 110-130   Recent Labs Lab 11/04/15 0439  11/05/15 0440 11/05/15 1645 11/06/15 0425 11/07/15 0410  NA 130*  < >  --  126* 129* 133*  K 4.4  < >  --  3.8 3.7 2.8*  CL 102  < >  --  101 101 101  CO2 19*  < >  --  20* 21* 22  BUN 38*  < >  --  37* 36* 32*  CREATININE 1.95*  < >  --  1.85* 1.83* 1.60*  CALCIUM 7.9*  < >  --  7.6* 7.5* 7.6*  MG 2.1  --  2.0  --  1.8  --   PHOS 4.2  < >  --  4.3 4.2 3.7  GLUCOSE 131*  < >  --  135* 120* 113*  < > = values in this interval not displayed. Diet Order:  DIET SOFT Room service appropriate? Yes; Fluid consistency: Thin  Skin: MSAD perineum, Blister to thigh, Dehisced/open back wound x3,  Dehisced/open wound to L buttocks,   Last BM:  7/27-diarrhea  Height:  Ht Readings from Last 1 Encounters:  10/30/15 5' 2" (1.575 m)   Weight:  Wt Readings from   Last 1 Encounters:  11/07/15 291 lb 14.2 oz (132.4 kg)   Wt Readings from Last 10 Encounters:  11/07/15 291 lb 14.2 oz (132.4 kg)  09/17/15 247 lb (112 kg)  08/28/15 247 lb 12.8 oz (112.4 kg)  08/26/15 240 lb (108.9 kg)  06/21/15 253 lb (114.8 kg)  06/07/15 236 lb (107 kg)  10/30/14 260 lb (117.9 kg)  09/11/14 262 lb 5.6 oz (119 kg)  05/13/14 257 lb 9.6 oz (116.8 kg)  11/26/12 235 lb (106.6 kg)  Dosing weight: 246 lbs-admit weight  Ideal Body Weight:  50 kg  BMI:  Body mass index is 53.39 kg/m.  Estimated Nutritional Needs:  Kcal:  1550-1750 kcals  Protein:  65-75 g Pro (1.3-1.5 g/kg IBW) Fluid:  >1.6 liters fluid + enough to replace stool losses  EDUCATION NEEDS:  No education needs identified at this time  Burtis Junes RD, LDN, [place] [facility] Pager:  [phone] 11/07/2015 1:56 PM

## 2015-11-07 NOTE — Progress Notes (Signed)
PROGRESS NOTE    Deborah Murray  [id number] DOB: 1961-03-28 DOA: 10/30/2015 PCP: Sol Passer, MD  Outpatient Specialists:  Gastroenterology: Daneil Dolin, MD    Brief Narrative: 84 yof with hx of A-fib, HTN, severe asthma/allergies, adrenal insufficiency, and recent back surgery, presented with complaints of abd pain and diarrhea that onset 2 weeks ago from her back surgery. She reported having loose stool once she arrived at home. Also, she reported having a fever and feeling very weak at home. While in the ED, temperature 102.3, BP normal, and WBC 23.7. CT abd/pelvis showed diffuse edematous wall thickening in the colon, from the cecal tip to the rectum associated with diffuse pericolonic edema/ inflammation which is compatible with pancolitis. C. Diff toxin positive. GI pathogen panel negative. Follow up CT abd/pelvis showed worsening pancolitis. Surgery and GI were consulted. Nephrology was consulted for her acute renal failure. She will need several more days of treatment.   Assessment & Plan:   Principal Problem:   Pancolitis ([place]) Active Problems:   Hypokalemia   Essential hypertension   Severe persistent asthma   Morbid obesity (H[facility])   C. difficile diarrhea   Hypotension   History of atrial fibrillation- NSR now   History of adrenal insufficiency   History of hypertension   C. difficile colitis   Metabolic acidosis   Hypocalcemia   Acute kidney injury ([place])   Hyponatremia   Sepsis (H[facility])   AKI (acute kidney injury) ([place])   Pyrexia   Protein-calorie malnutrition, severe   1. Pancolitis secondary to C. Diff colitis. C. Diff PCR positive. GI pathogen negative. General surgery consult appreciated. It was not felt that surgical intervention was necessary at this time. GI consult appreciated. Pt. continues to require rectal tube. Continue oral vanc and IV Flagyl. Continue florastor. 2. Essential HTN, was hypotensive on admission. Outpatient meds were held.  Hypotension resolved with IV fluids. She briefly require neo-senephrine but has been weaned off. Continue IV Fluids.  3. Hyponatremia. Sodium improving. Continue lasix 4. AKI. Nephrology has been consulted. Continue to monitor in the setting of IV fluid and lasix. Currently on lasix infusion. Renal function improving. 5. Atrial fibrillation. Currently in NSR. CHADS score 2. Pt was on xarelto prior to admission. Will resume on discharge once acute issues are resolved. 6. Hypokalemia. Replace 7. Hypomagnesemia. Within normal limits 8. Hypocalcemia. When calcium is corrected for hypoalbunemia   Calcium is 8.8 which is approaching normal range. Continue calcium supplementation. 9. Severe malnutrition in the context of acute illness/injury. Nutrition following. 10. Morbid obesity. Nutrition following 11. Adrenal insufficiency. Pt is chronically on prednisone, but since being in the hospital has been receiving stress dose of hydrocortisone. Since she has clinically improved will transition to prednisone taper.   DVT prophylaxis: SQ heparin Code Status: Full Family Communication: No family at bedside  Disposition Plan: Discharge once improved    Consultants:   GI  Nutrition   General surgery  Nephrology  Procedures:   None  Antimicrobials:   Vanc   7/23 >>>  Metronidazole  7/19 >>>   Subjective: Pt reports abdomen is feeling better. She is currently eating well. Breathing is well. Denies vomiting.  Objective: Vitals:   11/07/15 0500 11/07/15 0600 11/07/15 0700 11/07/15 0800  BP: 133/90 (!) 134/105 (!) 122/93   Pulse: 85 83 85   Resp: 14 15 17    Temp:    98.1 F (36.7 C)  TempSrc:    Oral  SpO2: 100% 100% 100%  Weight: 132.4 kg (291 lb 14.2 oz)     Height:        Intake/Output Summary (Last 24 hours) at 11/07/15 0825 Last data filed at 11/07/15 [id number]  Gross per 24 hour  Intake             2036 ml  Output             1100 ml  Net              936 ml   Filed  Weights   11/05/15 0500 11/06/15 0500 11/07/15 0500  Weight: 130 kg (286 lb 9.6 oz) 130 kg (286 lb 9.6 oz) 132.4 kg (291 lb 14.2 oz)   Examination:  General exam: Appears calm and comfortable  Respiratory system: Clear to auscultation. Respiratory effort normal. Cardiovascular system: S1 & S2 heard, RRR. No JVD, murmurs, rubs, gallops or clicks. 2+ pedal edema. Gastrointestinal system: Abdomen is nondistended, soft and nontender. No organomegaly or masses felt. Normal bowel sounds heard. Central nervous system: Alert and oriented. No focal neurological deficits. Extremities: Symmetric 5 x 5 power. Skin: No rashes, lesions or ulcers Psychiatry: Judgement and insight appear normal. Mood & affect appropriate.   Data Reviewed:  CBC:  Recent Labs Lab 11/03/15 0806 11/04/15 0439 11/05/15 0440 11/06/15 0425 11/07/15 0410  WBC 32.2* 24.4* 18.3* 18.2* 19.1*  HGB 12.5 10.8* 11.1* 11.2* 11.5*  HCT 36.9 32.2* 32.5* 32.7* 32.9*  MCV 82.6 83.9 84.2 82.8 82.3  PLT 272 235 198 201 [id number]   Basic Metabolic Panel:  Recent Labs Lab 11/03/15 0328 11/03/15 0806 11/04/15 0439 11/04/15 1627 11/05/15 0440 11/05/15 1645 11/06/15 0425 11/07/15 0410  NA  --  131* 130* 126*  --  126* 129* 133*  K  --  4.1 4.4 4.5  --  3.8 3.7 2.8*  CL  --  104 102 99*  --  101 101 101  CO2  --  19* 19* 19*  --  20* 21* 22  GLUCOSE  --  119* 131* 130*  --  135* 120* 113*  BUN  --  37* 38* 37*  --  37* 36* 32*  CREATININE  --  2.10* 1.95* 1.93*  --  1.85* 1.83* 1.60*  CALCIUM  --  6.8* 7.9* 7.9*  --  7.6* 7.5* 7.6*  MG 2.1 2.1 2.1  --  2.0  --  1.8  --   PHOS  --   --  4.2 4.4  --  4.3 4.2 3.7   GFR: Estimated Creatinine Clearance: 50.8 mL/min (by C-G formula based on SCr of 1.6 mg/dL). Liver Function Tests:  Recent Labs Lab 11/01/15 2038 11/04/15 0439 11/04/15 1627 11/05/15 1645 11/06/15 0425 11/07/15 0410  AST 26 23  --   --   --   --   ALT 14 10*  --   --   --   --   ALKPHOS 79 62  --   --   --    --   BILITOT 0.4 0.3  --   --   --   --   PROT 4.6* 4.3*  --   --   --   --   ALBUMIN 1.9* 2.6* 2.9* 2.6* 2.5* 2.5*   Cardiac Enzymes:  Recent Labs Lab 11/03/15 0957  CKTOTAL 36*   Urine analysis:    Component Value Date/Time   COLORURINE BROWN (A) 11/02/2015 0525   APPEARANCEUR CLOUDY (A) 11/02/2015 0525   LABSPEC 1.025 11/02/2015 [place]  5.0 11/02/2015 0525   GLUCOSEU 100 (A) 11/02/2015 0525   HGBUR LARGE (A) 11/02/2015 0525   BILIRUBINUR MODERATE (A) 11/02/2015 0525   KETONESUR 15 (A) 11/02/2015 0525   PROTEINUR 100 (A) 11/02/2015 0525   UROBILINOGEN 1.0 05/12/2014 1319   NITRITE POSITIVE (A) 11/02/2015 0525   LEUKOCYTESUR TRACE (A) 11/02/2015 0525   Sepsis Labs: @LABRCNTIP (procalcitonin:4,lacticidven:4)  ) Recent Results (from the past 240 hour(s))  Urine culture     Status: None   Collection Time: 10/30/15 12:38 PM  Result Value Ref Range Status   Specimen Description URINE, CATHETERIZED  Final   Special Requests NONE  Final   Culture NO GROWTH Performed at [facility]   Final   Report Status 10/31/2015 FINAL  Final  Culture, blood (routine x 2)     Status: None   Collection Time: 10/30/15 12:39 PM  Result Value Ref Range Status   Specimen Description BLOOD LEFT ANTECUBITAL  Final   Special Requests BOTTLES DRAWN AEROBIC AND ANAEROBIC 10CC EACH  Final   Culture NO GROWTH 5 DAYS  Final   Report Status 11/04/2015 FINAL  Final  Culture, blood (routine x 2)     Status: None   Collection Time: 10/30/15 12:53 PM  Result Value Ref Range Status   Specimen Description BLOOD RIGHT ANTECUBITAL  Final   Special Requests BOTTLES DRAWN AEROBIC AND ANAEROBIC 6CC EACH  Final   Culture NO GROWTH 5 DAYS  Final   Report Status 11/04/2015 FINAL  Final  Gastrointestinal Panel by PCR , Stool     Status: None   Collection Time: 10/31/15  2:21 AM  Result Value Ref Range Status   Campylobacter species NOT DETECTED NOT DETECTED Final   Plesimonas shigelloides  NOT DETECTED NOT DETECTED Final   Salmonella species NOT DETECTED NOT DETECTED Final   Yersinia enterocolitica NOT DETECTED NOT DETECTED Final   Vibrio species NOT DETECTED NOT DETECTED Final   Vibrio cholerae NOT DETECTED NOT DETECTED Final   Enteroaggregative E coli (EAEC) NOT DETECTED NOT DETECTED Final   Enteropathogenic E coli (EPEC) NOT DETECTED NOT DETECTED Final   Enterotoxigenic E coli (ETEC) NOT DETECTED NOT DETECTED Final   Shiga like toxin producing E coli (STEC) NOT DETECTED NOT DETECTED Final   E. coli O157 NOT DETECTED NOT DETECTED Final   Shigella/Enteroinvasive E coli (EIEC) NOT DETECTED NOT DETECTED Final   Cryptosporidium NOT DETECTED NOT DETECTED Final   Cyclospora cayetanensis NOT DETECTED NOT DETECTED Final   Entamoeba histolytica NOT DETECTED NOT DETECTED Final   Giardia lamblia NOT DETECTED NOT DETECTED Final   Adenovirus F40/41 NOT DETECTED NOT DETECTED Final   Astrovirus NOT DETECTED NOT DETECTED Final   Norovirus GI/GII NOT DETECTED NOT DETECTED Final   Rotavirus A NOT DETECTED NOT DETECTED Final   Sapovirus (I, II, IV, and V) NOT DETECTED NOT DETECTED Final  C difficile quick scan w PCR reflex     Status: Abnormal   Collection Time: 10/31/15  2:21 AM  Result Value Ref Range Status   C Diff antigen POSITIVE (A) NEGATIVE Final   C Diff toxin POSITIVE (A) NEGATIVE Final   C Diff interpretation Toxin producing C. difficile detected.  Final    Comment: CRITICAL RESULT CALLED TO, READ BACK BY AND VERIFIED WITH: Deborah Murray AT 0341 ON [id number] BY FORSYTH K   Stool culture (children & immunocomp patients)     Status: None   Collection Time: 10/31/15  2:21 AM  Result Value Ref Range Status  Salmonella/Shigella Screen Final report  Final   Campylobacter Culture Final report  Final   E coli, Shiga toxin Assay Negative Negative Final    Comment: (NOTE) Performed At: [facility] [place], [place] [id number] Lindon Romp MD [id number]     STOOL CULTURE REFLEX - RSASHR     Status: None   Collection Time: 10/31/15  2:21 AM  Result Value Ref Range Status   Stool Culture result 1 (RSASHR) Comment  Final    Comment: (NOTE) No Salmonella or Shigella recovered. Performed At: [facility] [address] [place], [place] [id number] Lindon Romp MD [id number]   STOOL CULTURE Reflex - CMPCXR     Status: None   Collection Time: 10/31/15  2:21 AM  Result Value Ref Range Status   Stool Culture result 1 (CMPCXR) Comment  Final    Comment: (NOTE) No Campylobacter species isolated. Performed At: [facility] [place], [place] [id number] Lindon Romp MD [id number]   Culture, Urine     Status: None   Collection Time: 11/02/15  4:02 PM  Result Value Ref Range Status   Specimen Description URINE, CATHETERIZED  Final   Special Requests Immunocompromised  Final   Culture NO GROWTH Performed at [facility]   Final   Report Status 11/03/2015 FINAL  Final  MRSA PCR Screening     Status: None   Collection Time: 11/03/15 12:10 PM  Result Value Ref Range Status   MRSA by PCR NEGATIVE NEGATIVE Final    Comment:        The GeneXpert MRSA Assay (FDA approved for NASAL specimens only), is one component of a comprehensive MRSA colonization surveillance program. It is not intended to diagnose MRSA infection nor to guide or monitor treatment for MRSA infections.     Scheduled Meds: . budesonide  0.5 mg Nebulization BID  . citalopram  20 mg Oral QHS  . DULoxetine  30 mg Oral Daily  . famotidine  20 mg Oral BID  . feeding supplement  1 Container Oral BID BM  . gabapentin  600 mg Oral TID  . heparin subcutaneous  5,000 Units Subcutaneous Q8H  . hydrocortisone sod succinate (SOLU-CORTEF) inj  50 mg Intravenous Q12H  . latanoprost  1 drop Both Eyes QHS  . loratadine  10 mg Oral Daily  . metronidazole  500 mg Intravenous Q8H  . montelukast  10 mg Oral QHS  . nystatin  5 mL Oral QID   . potassium chloride  10 mEq Intravenous Q1 Hr x 3  . potassium chloride  40 mEq Oral BID  . saccharomyces boulardii  250 mg Oral BID  . tiotropium  1 capsule Inhalation Daily  . vancomycin  500 mg Oral Q6H   Continuous Infusions: . dextrose 5 % and 0.45% NaCl 1,000 mL with sodium bicarbonate 50 mEq infusion 125 mL/hr at 11/06/15 1315  . furosemide (LASIX) infusion 8 mg/hr (11/07/15 0600)     LOS: 8 days    Time spent: 25 minutes    Merry Proud, MD [facility]  If 7PM-7AM, please contact night-coverage [email or web address] Password [email or web address] 11/07/2015, 8:25 AM    By signing my name below, I, Deborah Murray, attest that this documentation has been prepared under the direction and in the presence of Deborah Dike, MD. Electronically signed: Hilbert Murray, Scribe.  11/07/15, 8:27 AM  I, Dr. Kathie Murray, personally performed the services described in this documentaiton. All medical record entries made by the  scribe were at my direction and in my presence. I have reviewed the chart and agree that the record reflects my personal performance and is accurate and complete  Deborah Dike, MD, 11/07/2015 8:49 AM

## 2015-11-07 NOTE — Progress Notes (Signed)
Report called to Deborah Purpura, RN on dept 300. Verbalized understanding. Pt transferred to room 320 in safe and stable condition.

## 2015-11-07 NOTE — Progress Notes (Signed)
Subjective:  Patient doesn't feel as good today. She complains of central crampy abdominal discomfort. Some nausea. No vomiting.   Objective: Vital signs in last 24 hours: Temp:  [97 F (36.1 C)-97.7 F (36.5 C)] 97.7 F (36.5 C) (07/27 0400) Pulse Rate:  [83-97] 85 (07/27 0700) Resp:  [13-18] 17 (07/27 0700) BP: (122-145)/(90-110) 122/93 (07/27 0700) SpO2:  [96 %-100 %] 100 % (07/27 0700) Weight:  [291 lb 14.2 oz (132.4 kg)] 291 lb 14.2 oz (132.4 kg) (07/27 0500) Last BM Date: 11/06/15 General:   Alert,  Well-developed, well-nourished, pleasant and cooperative in NAD Head:  Normocephalic and atraumatic. Eyes:  Sclera clear, no icterus.  Abdomen:  Soft, mild tenderness with palpation in upper to mid abd. Obese but soft.   Normal bowel sounds, without guarding, and without rebound.   Extremities:  Without clubbing, deformity. 2+ edema in LE bilaterally. Neurologic:  Alert and  oriented x4;  grossly normal neurologically. Skin:  Intact without significant lesions or rashes. Psych:  Alert and cooperative. Normal mood and affect.  Intake/Output from previous day: 07/26 0701 - 07/27 0700 In: 2036 [P.O.:240; I.V.:1596; IV Piggyback:200] Out: 1100 [Urine:1100] Intake/Output this shift: No intake/output data recorded.  Lab Results: CBC  Recent Labs  11/05/15 0440 11/06/15 0425 11/07/15 0410  WBC 18.3* 18.2* 19.1*  HGB 11.1* 11.2* 11.5*  HCT 32.5* 32.7* 32.9*  MCV 84.2 82.8 82.3  PLT 198 201 191   BMET  Recent Labs  11/05/15 1645 11/06/15 0425 11/07/15 0410  NA 126* 129* 133*  K 3.8 3.7 2.8*  CL 101 101 101  CO2 20* 21* 22  GLUCOSE 135* 120* 113*  BUN 37* 36* 32*  CREATININE 1.85* 1.83* 1.60*  CALCIUM 7.6* 7.5* 7.6*   LFTs  Recent Labs  11/05/15 1645 11/06/15 0425 11/07/15 0410  ALBUMIN 2.6* 2.5* 2.5*   No results for input(s): LIPASE in the last 72 hours. PT/INR No results for input(s): LABPROT, INR in the last 72 hours.    Imaging Studies: Ct  Abdomen Pelvis Wo Contrast  Result Date: 11/02/2015 CLINICAL DATA:  C difficile colitis.  Abdominal pain EXAM: CT ABDOMEN AND PELVIS WITHOUT CONTRAST TECHNIQUE: Multidetector CT imaging of the abdomen and pelvis was performed following the standard protocol without IV contrast. COMPARISON:  10/30/2015 FINDINGS: Lower chest and abdominal wall: Small pericardial effusion. Small pleural effusions. Aortic atherosclerosis, notable for age. Hepatobiliary: No focal liver abnormality.Cholecystectomy. No suspected bile duct dilatation. Pancreas: Unremarkable. Spleen: Unremarkable. Adrenals/Urinary Tract: Negative adrenals. No hydronephrosis or stone. The bladder is decompressed by Foley catheter. Stomach/Bowel: Diffuse marked colonic wall thickening and mesocolonic fat edema has worsened. Inflammation is pancolonic. The colon is more distended than previously. No visible pneumatosis or perforation. No bowel obstruction. No focal pericecal inflammation. Status post gastric bypass. Reproductive:No pathologic findings. Vascular/Lymphatic: No acute vascular abnormality. No mass or adenopathy. Other: No pneumoperitoneum. Musculoskeletal: L4-5 discectomy. Interbody fusion is questionable but there is likely posterior-lateral solid bone. Remote L1 superior endplate fracture. Spondylosis. These results were called by telephone at the time of interpretation on 11/02/2015 at 9:17 pm to Dr Marin Comment, who verbally acknowledged these results. IMPRESSION: 1. Worsening C difficile pancolitis. The colon is more distended than previously, concerning for developing toxic megacolon. No perforation. 2. New small ascites and pleural effusions. Electronically Signed   By: Monte Fantasia M.D.   On: 11/02/2015 21:35   Ct Abdomen Pelvis Wo Contrast  Result Date: 10/30/2015 CLINICAL DATA:  Nausea with vomiting and diarrhea. Abdominal pain for 1 week. EXAM:  CT ABDOMEN AND PELVIS WITHOUT CONTRAST TECHNIQUE: Multidetector CT imaging of the abdomen and  pelvis was performed following the standard protocol without IV contrast. COMPARISON:  None. FINDINGS: Lower chest:  Bilateral dependent atelectasis. Hepatobiliary: No focal abnormality in the liver on this study without intravenous contrast. No evidence of hepatomegaly. Gallbladder surgically absent. No intrahepatic or extrahepatic biliary dilation. Pancreas: No focal mass lesion. No dilatation of the main duct. No intraparenchymal cyst. No peripancreatic edema. Spleen: No splenomegaly. No focal mass lesion. Adrenals/Urinary Tract: No adrenal nodule or mass. No hydronephrosis in either kidney. Uninfused imaging shows no focal abnormality of either kidney. No hydroureter. Foley catheter decompresses the urinary bladder. Stomach/Bowel: Patient is status post gastric bypass surgery. Duodenum is normally positioned as is the ligament of Treitz. No small bowel wall thickening. No small bowel dilatation. Terminal ileum unremarkable. Appendix not well seen. Marked circumferential wall thickening and edema is seen in the colon diffusely, from the cecal tip to the level of the rectum. This is associated with diffuse pericolonic edema/ inflammation. Vascular/Lymphatic: There is abdominal aortic atherosclerosis without aneurysm. There is no gastrohepatic or hepatoduodenal ligament lymphadenopathy. No intraperitoneal or retroperitoneal lymphadenopathy. No pelvic sidewall lymphadenopathy. Reproductive: The uterus is surgically absent. There is no adnexal mass. Other: No substantial intraperitoneal free fluid. Musculoskeletal: Patient is status post lower lumbar fusion. Bone windows reveal no worrisome lytic or sclerotic osseous lesions. IMPRESSION: Diffuse edematous wall thickening in the colon, from the cecal tip to the rectum associated with diffuse pericolonic edema/ inflammation. Imaging features are compatible with infectious/inflammatory pancolitis. Electronically Signed   By: Misty Stanley M.D.   On: 10/30/2015 17:03    US Renal  Result Date: 11/03/2015 CLINICAL DATA:  Acute kidney injury, UTI EXAM: RENAL / URINARY TRACT ULTRASOUND COMPLETE COMPARISON:  CT abdomen pelvis dated 11/02/2015 FINDINGS: Right Kidney: Length: 10.5 cm.  No mass or hydronephrosis. Left Kidney: Length: 10.3 cm.  No mass or hydronephrosis. Bladder: Poorly visualized/underdistended. IMPRESSION: No hydronephrosis. Electronically Signed   By: Julian Hy M.D.   On: 11/03/2015 11:58  Dg Chest Port 1 View  Result Date: 10/30/2015 CLINICAL DATA:  Abdominal pain with nausea vomiting and diarrhea. EXAM: PORTABLE CHEST 1 VIEW COMPARISON:  07/06/2015. FINDINGS: 1249 hours. Lung volumes are low. The lungs are clear wiithout focal pneumonia, edema, pneumothorax or pleural effusion. Cardiopericardial silhouette is at upper limits of normal for size. The visualized bony structures of the thorax are intact. Telemetry leads overlie the chest. IMPRESSION: Low volume film without acute cardiopulmonary findings. Electronically Signed   By: Misty Stanley M.D.   On: 10/30/2015 13:00   Dg Abd Portable 1v  Result Date: 11/04/2015 CLINICAL DATA:  Colitis EXAM: PORTABLE ABDOMEN - 1 VIEW COMPARISON:  CT 11/02/2015 FINDINGS: Nonobstructive bowel gas pattern. No visible free air on these supine views. No organomegaly or suspicious calcification. Prior fusion in the lower lumbar spine. IMPRESSION: No evidence of bowel obstruction or free air. Electronically Signed   By: Rolm Baptise M.D.   On: 11/04/2015 12:09 [2 weeks]   Assessment:  55 year old female with severe/complicated Cdiff colitis that has been slowly responding to therapy. Notes improved abdominal pain yesterday but somewhat worse today.  Leukocytosis is stable, ?remains elevated in setting of solu-cortef. Is transitioning to oral prednisone today. Unfortunately stool output not recorded the last 24 hours as far as quantity. flexiseal currently full of green liquid stool.    Appreciate nephrology  recommendations and support.   Anasarca: in setting of hypoalbuminemia, acute illness.  Adrenal insufficiency: transitioned to oral prednisone today.   Acute renal injury: nephrology on board.  Oral thrush: nystatin swish and swallow.  Plan: 1. ?worsening abdominal pain in setting of diet advancement. She is on soft diet, "lactose free". She has whole milk on her tray today and ate mac and cheese for dinner. Patient aware to avoid dairy. Nutrition to be notified of dietary requirement. Will continue to monitor for worsening pain.  2. Flagyl and vanc with taper instructions as detailed in yesterday's office note.  3. Dr. Oneida Alar to see patient this afternoon to reevalutate.   Laureen Ochs. Bernarda Caffey [facility] [phone] 7/27/20172:22 PM      LOS: 8 days

## 2015-11-08 DIAGNOSIS — E43 Unspecified severe protein-calorie malnutrition: Secondary | ICD-10-CM

## 2015-11-08 LAB — RENAL FUNCTION PANEL
ALBUMIN: 2.6 g/dL — AB (ref 3.5–5.0)
Anion gap: 8 (ref 5–15)
BUN: 30 mg/dL — ABNORMAL HIGH (ref 6–20)
CHLORIDE: 102 mmol/L (ref 101–111)
CO2: 23 mmol/L (ref 22–32)
CREATININE: 1.64 mg/dL — AB (ref 0.44–1.00)
Calcium: 7.7 mg/dL — ABNORMAL LOW (ref 8.9–10.3)
GFR, EST AFRICAN AMERICAN: 39 mL/min — AB (ref >60)
GFR, EST NON AFRICAN AMERICAN: 34 mL/min — AB (ref >60)
Glucose, Bld: 120 mg/dL — ABNORMAL HIGH (ref 65–99)
PHOSPHORUS: 3.3 mg/dL (ref 2.5–4.6)
POTASSIUM: 3.3 mmol/L — AB (ref 3.5–5.1)
Sodium: 133 mmol/L — ABNORMAL LOW (ref 135–145)

## 2015-11-08 LAB — CBC
HEMATOCRIT: 37.7 % (ref 36.0–46.0)
Hemoglobin: 12.9 g/dL (ref 12.0–15.0)
MCH: 28.3 pg (ref 26.0–34.0)
MCHC: 34.2 g/dL (ref 30.0–36.0)
MCV: 82.7 fL (ref 78.0–100.0)
PLATELETS: 214 10*3/uL (ref 150–400)
RBC: 4.56 MIL/uL (ref 3.87–5.11)
RDW: 16.2 % — ABNORMAL HIGH (ref 11.5–15.5)
WBC: 23.2 10*3/uL — AB (ref 4.0–10.5)

## 2015-11-08 MED ORDER — FUROSEMIDE 10 MG/ML IJ SOLN
120.0000 mg | Freq: Two times a day (BID) | INTRAVENOUS | Status: DC
  Administered 2015-11-08 – 2015-11-11 (×8): 120 mg via INTRAVENOUS
  Filled 2015-11-08 (×9): qty 12

## 2015-11-08 MED ORDER — BOOST / RESOURCE BREEZE PO LIQD
1.0000 | Freq: Three times a day (TID) | ORAL | Status: DC
  Administered 2015-11-09 – 2015-11-19 (×12): 1 via ORAL

## 2015-11-08 MED ORDER — HYDRALAZINE HCL 20 MG/ML IJ SOLN
5.0000 mg | Freq: Once | INTRAMUSCULAR | Status: AC
  Administered 2015-11-08: 5 mg via INTRAVENOUS
  Filled 2015-11-08: qty 1

## 2015-11-08 MED ORDER — PROMETHAZINE HCL 12.5 MG PO TABS: 25.0000 mg | ORAL_TABLET | Freq: Four times a day (QID) | ORAL | Status: DC | PRN

## 2015-11-08 MED ORDER — POTASSIUM CHLORIDE CRYS ER 20 MEQ PO TBCR
40.0000 meq | EXTENDED_RELEASE_TABLET | ORAL | Status: AC
  Administered 2015-11-08 (×2): 40 meq via ORAL
  Filled 2015-11-08: qty 2

## 2015-11-08 MED ORDER — PROMETHAZINE HCL 12.5 MG PO TABS
12.5000 mg | ORAL_TABLET | Freq: Three times a day (TID) | ORAL | Status: DC
  Administered 2015-11-08 – 2015-11-18 (×37): 12.5 mg via ORAL
  Filled 2015-11-08 (×38): qty 1

## 2015-11-08 MED ORDER — PREDNISONE 10 MG PO TABS
15.0000 mg | ORAL_TABLET | Freq: Every day | ORAL | Status: DC
Start: 2015-11-08 — End: 2015-11-19
  Administered 2015-11-08 – 2015-11-15 (×8): 15 mg via ORAL
  Administered 2015-11-16: 08:00:00 via ORAL
  Administered 2015-11-17 – 2015-11-19 (×3): 15 mg via ORAL
  Filled 2015-11-08 (×12): qty 2

## 2015-11-08 NOTE — Progress Notes (Signed)
PROGRESS NOTE    Deborah Murray  [id number] DOB: [birth year] DOA: 10/30/2015 PCP: Sol Passer, MD  Outpatient Specialists:  Gastroenterology: Daneil Dolin, MD  Brief Narrative: 84 yof with hx of A-fib, HTN, severe asthma/allergies, adrenal insufficiency, and recent back surgery, presented with complaints of abd pain and diarrhea that onset 2 weeks ago from her back surgery. She reported having loose stool once she arrived at home. Also, she reported having a fever and feeling very weak at home. While in the ED, temperature 102.3, BP normal, and WBC 23.7. CT abd/pelvis showed diffuse edematous wall thickening in the colon, from the cecal tip to the rectum associated with diffuse pericolonic edema/ inflammation which is compatible with pancolitis. C. Diff toxin positive. GI pathogen panel negative. Follow up CT abd/pelvis showed worsening pancolitis. Surgery and GI were consulted. Nephrology was consulted for her acute renal failure. PT consult has been requested. She will need several more days of treatment.   Assessment & Plan:   Principal Problem:   Pancolitis ([place]) Active Problems:   Hypokalemia   Essential hypertension   Severe persistent asthma   Morbid obesity (H[facility])   C. difficile diarrhea   Hypotension   History of atrial fibrillation- NSR now   History of adrenal insufficiency   History of hypertension   C. difficile colitis   Metabolic acidosis   Hypocalcemia   Acute kidney injury ([place])   Hyponatremia   Sepsis (HCC)   AKI (acute kidney injury) ([place])   Pyrexia   Protein-calorie malnutrition, severe   1. Pancolitis secondary to C. Diff colitis. C. Diff PCR positive. GI pathogen negative. General surgery consult appreciated. It was not felt that surgical intervention was necessary at this time. GI consult appreciated. Her stool appears to be semi-solid/formed, so will remove rectal tube and monitor. She has been immobile recently as well, so will have PT  evaluate. Advance diet as tolerated. Continue oral Vanc and IV Flagyl. Continue florastor. She complains of continued nausea and abd pain. Will continue anti-emetics. Abd xray form yesterday was unremarkable.  2. Essential HTN, was hypotensive on admission. Outpatient meds were held. Hypotension resolved with IV fluids. She briefly require neo-senephrine but has been weaned off. Continue IV Fluids.  3. AKI. Nephrology has been consulted. Continue to monitor in the setting of IV fluid and lasix. Will transition to IV lasix twice a day. Follow up CMP in the AM. 4. Atrial fibrillation. Currently in NSR. CHADS score 2. Pt was on Xarelto prior to admission. Will resume on discharge once acute issues are resolved. 5. Leukocytosis. WBC count 23. Patient is afebrile. Possibly related to steroids. Will decrease prednisone to chronic dose of 15 mg daily. and repeat CBC in the AM. Will repeat BC. 6. Adrenal insufficiency. Pt is chronically on prednisone, but since being in the hospital has been receiving stress dose of hydrocortisone. Since she has clinically improved has been transitioned to prednisone taper. 7. Hypokalemia. Improving.  8. Hyponatremia. Sodium improving. Continue lasix 9. Hypocalcemia. When calcium is corrected for hypoalbumenemia, calcium is wnl. 10. Severe malnutrition in the context of acute illness/injury. Nutrition following. 11. Morbid obesity. Nutrition following    DVT prophylaxis: SQ heparin Code Status: Full Family Communication: No family at bedside  Disposition Plan: Discharge once improved    Consultants:   GI  Nutrition   General surgery  Nephrology  Procedures:   None  Antimicrobials:   Vanc   7/23 >>  Metronidazole  7/19 >>  Subjective: Complains of  generalized abdominal pain. Reports mild back pain. Denies vomiting.   Objective: Vitals:   11/07/15 1950 11/07/15 2111 11/08/15 0111 11/08/15 0447  BP:  (!) 148/114 (!) 140/103 (!) 143/102  Pulse:  94  95 99  Resp:  20 19 18   Temp:  97.9 F (36.6 C) 98.1 F (36.7 C) 97.6 F (36.4 C)  TempSrc:  Oral Oral Oral  SpO2: 96% 98% 99% 100%  Weight:    135 kg (297 lb 9.9 oz)  Height:        Intake/Output Summary (Last 24 hours) at 11/08/15 0703 Last data filed at 11/08/15 0300  Gross per 24 hour  Intake              600 ml  Output             1425 ml  Net             -825 ml   Filed Weights   11/06/15 0500 11/07/15 0500 11/08/15 0447  Weight: 130 kg (286 lb 9.6 oz) 132.4 kg (291 lb 14.2 oz) 135 kg (297 lb 9.9 oz)   Examination:  General exam: Appears calm and comfortable  Respiratory system: Clear to auscultation. Respiratory effort normal. Cardiovascular system: S1 & S2 heard, RRR. No JVD, murmurs, rubs, gallops or clicks. 1-2+ pedal edema. Gastrointestinal system: Abdomen is nondistended, soft and diffusely tender. Bowel sounds are sluggish. No organomegaly or masses felt. Normal bowel sounds heard. Central nervous system: Alert and oriented. No focal neurological deficits. Extremities: Symmetric 5 x 5 power. Skin: No rashes, lesions or ulcers Psychiatry: Judgement and insight appear normal. Mood & affect appropriate.   Data Reviewed:  CBC:  Recent Labs Lab 11/04/15 0439 11/05/15 0440 11/06/15 0425 11/07/15 0410 11/08/15 0500  WBC 24.4* 18.3* 18.2* 19.1* 23.2*  HGB 10.8* 11.1* 11.2* 11.5* 12.9  HCT 32.2* 32.5* 32.7* 32.9* 37.7  MCV 83.9 84.2 82.8 82.3 82.7  PLT 235 198 201 191 [id number]   Basic Metabolic Panel:  Recent Labs Lab 11/03/15 0328 11/03/15 0806 11/04/15 0439 11/04/15 1627 11/05/15 0440 11/05/15 1645 11/06/15 0425 11/07/15 0410 11/08/15 0500  NA  --  131* 130* 126*  --  126* 129* 133* 133*  K  --  4.1 4.4 4.5  --  3.8 3.7 2.8* 3.3*  CL  --  104 102 99*  --  101 101 101 102  CO2  --  19* 19* 19*  --  20* 21* 22 23  GLUCOSE  --  119* 131* 130*  --  135* 120* 113* 120*  BUN  --  37* 38* 37*  --  37* 36* 32* 30*  CREATININE  --  2.10* 1.95* 1.93*  --   1.85* 1.83* 1.60* 1.64*  CALCIUM  --  6.8* 7.9* 7.9*  --  7.6* 7.5* 7.6* 7.7*  MG 2.1 2.1 2.1  --  2.0  --  1.8  --   --   PHOS  --   --  4.2 4.4  --  4.3 4.2 3.7 3.3   GFR: Estimated Creatinine Clearance: 50.2 mL/min (by C-G formula based on SCr of 1.64 mg/dL). Liver Function Tests:  Recent Labs Lab 11/01/15 2038 11/04/15 0439 11/04/15 1627 11/05/15 1645 11/06/15 0425 11/07/15 0410 11/08/15 0500  AST 26 23  --   --   --   --   --   ALT 14 10*  --   --   --   --   --   ALKPHOS 79  62  --   --   --   --   --   BILITOT 0.4 0.3  --   --   --   --   --   PROT 4.6* 4.3*  --   --   --   --   --   ALBUMIN 1.9* 2.6* 2.9* 2.6* 2.5* 2.5* 2.6*   Cardiac Enzymes:  Recent Labs Lab 11/03/15 0957  CKTOTAL 36*   Urine analysis:    Component Value Date/Time   COLORURINE BROWN (A) 11/02/2015 0525   APPEARANCEUR CLOUDY (A) 11/02/2015 0525   LABSPEC 1.025 11/02/2015 0525   PHURINE 5.0 11/02/2015 0525   GLUCOSEU 100 (A) 11/02/2015 0525   HGBUR LARGE (A) 11/02/2015 0525   BILIRUBINUR MODERATE (A) 11/02/2015 0525   KETONESUR 15 (A) 11/02/2015 0525   PROTEINUR 100 (A) 11/02/2015 0525   UROBILINOGEN 1.0 05/12/2014 1319   NITRITE POSITIVE (A) 11/02/2015 0525   LEUKOCYTESUR TRACE (A) 11/02/2015 0525   Sepsis Labs: @LABRCNTIP (procalcitonin:4,lacticidven:4)  ) Recent Results (from the past 240 hour(s))  Urine culture     Status: None   Collection Time: 10/30/15 12:38 PM  Result Value Ref Range Status   Specimen Description URINE, CATHETERIZED  Final   Special Requests NONE  Final   Culture NO GROWTH Performed at [facility]   Final   Report Status 10/31/2015 FINAL  Final  Culture, blood (routine x 2)     Status: None   Collection Time: 10/30/15 12:39 PM  Result Value Ref Range Status   Specimen Description BLOOD LEFT ANTECUBITAL  Final   Special Requests BOTTLES DRAWN AEROBIC AND ANAEROBIC 10CC EACH  Final   Culture NO GROWTH 5 DAYS  Final   Report Status 11/04/2015  FINAL  Final  Culture, blood (routine x 2)     Status: None   Collection Time: 10/30/15 12:53 PM  Result Value Ref Range Status   Specimen Description BLOOD RIGHT ANTECUBITAL  Final   Special Requests BOTTLES DRAWN AEROBIC AND ANAEROBIC 6CC EACH  Final   Culture NO GROWTH 5 DAYS  Final   Report Status 11/04/2015 FINAL  Final  Gastrointestinal Panel by PCR , Stool     Status: None   Collection Time: 10/31/15  2:21 AM  Result Value Ref Range Status   Campylobacter species NOT DETECTED NOT DETECTED Final   Plesimonas shigelloides NOT DETECTED NOT DETECTED Final   Salmonella species NOT DETECTED NOT DETECTED Final   Yersinia enterocolitica NOT DETECTED NOT DETECTED Final   Vibrio species NOT DETECTED NOT DETECTED Final   Vibrio cholerae NOT DETECTED NOT DETECTED Final   Enteroaggregative E coli (EAEC) NOT DETECTED NOT DETECTED Final   Enteropathogenic E coli (EPEC) NOT DETECTED NOT DETECTED Final   Enterotoxigenic E coli (ETEC) NOT DETECTED NOT DETECTED Final   Shiga like toxin producing E coli (STEC) NOT DETECTED NOT DETECTED Final   E. coli O157 NOT DETECTED NOT DETECTED Final   Shigella/Enteroinvasive E coli (EIEC) NOT DETECTED NOT DETECTED Final   Cryptosporidium NOT DETECTED NOT DETECTED Final   Cyclospora cayetanensis NOT DETECTED NOT DETECTED Final   Entamoeba histolytica NOT DETECTED NOT DETECTED Final   Giardia lamblia NOT DETECTED NOT DETECTED Final   Adenovirus F40/41 NOT DETECTED NOT DETECTED Final   Astrovirus NOT DETECTED NOT DETECTED Final   Norovirus GI/GII NOT DETECTED NOT DETECTED Final   Rotavirus A NOT DETECTED NOT DETECTED Final   Sapovirus (I, II, IV, and V) NOT DETECTED NOT DETECTED Final  C difficile quick scan w PCR reflex     Status: Abnormal   Collection Time: 10/31/15  2:21 AM  Result Value Ref Range Status   C Diff antigen POSITIVE (A) NEGATIVE Final   C Diff toxin POSITIVE (A) NEGATIVE Final   C Diff interpretation Toxin producing C. difficile detected.   Final    Comment: CRITICAL RESULT CALLED TO, READ BACK BY AND VERIFIED WITH: Marylouise Stacks AT [id number] ON [id number] BY FORSYTH K   Stool culture (children & immunocomp patients)     Status: None   Collection Time: 10/31/15  2:21 AM  Result Value Ref Range Status   Salmonella/Shigella Screen Final report  Final   Campylobacter Culture Final report  Final   E coli, Shiga toxin Assay Negative Negative Final    Comment: (NOTE) Performed At: [facility] [place], [place] [id number] Lindon Romp MD [id number]   STOOL CULTURE REFLEX - RSASHR     Status: None   Collection Time: 10/31/15  2:21 AM  Result Value Ref Range Status   Stool Culture result 1 (RSASHR) Comment  Final    Comment: (NOTE) No Salmonella or Shigella recovered. Performed At: [facility] [address] [place], [place] [id number] Lindon Romp MD [id number]   STOOL CULTURE Reflex - CMPCXR     Status: None   Collection Time: 10/31/15  2:21 AM  Result Value Ref Range Status   Stool Culture result 1 (CMPCXR) Comment  Final    Comment: (NOTE) No Campylobacter species isolated. Performed At: [facility] [place], [place] [id number] Lindon Romp MD [id number]   Culture, Urine     Status: None   Collection Time: 11/02/15  4:02 PM  Result Value Ref Range Status   Specimen Description URINE, CATHETERIZED  Final   Special Requests Immunocompromised  Final   Culture NO GROWTH Performed at [facility]   Final   Report Status 11/03/2015 FINAL  Final  MRSA PCR Screening     Status: None   Collection Time: 11/03/15 12:10 PM  Result Value Ref Range Status   MRSA by PCR NEGATIVE NEGATIVE Final    Comment:        The GeneXpert MRSA Assay (FDA approved for NASAL specimens only), is one component of a comprehensive MRSA colonization surveillance program. It is not intended to diagnose MRSA infection nor to guide or monitor treatment for MRSA  infections.     Scheduled Meds: . budesonide  0.5 mg Nebulization BID  . citalopram  20 mg Oral QHS  . DULoxetine  30 mg Oral Daily  . famotidine  20 mg Oral BID  . feeding supplement (PRO-STAT SUGAR FREE 64)  30 mL Oral BID  . gabapentin  600 mg Oral TID  . heparin subcutaneous  5,000 Units Subcutaneous Q8H  . latanoprost  1 drop Both Eyes QHS  . loratadine  10 mg Oral Daily  . metronidazole  500 mg Intravenous Q8H  . montelukast  10 mg Oral QHS  . nystatin  5 mL Oral QID  . ondansetron (ZOFRAN) IV  4 mg Intravenous TID WC & HS  . potassium chloride  40 mEq Oral BID  . predniSONE  40 mg Oral Q breakfast  . saccharomyces boulardii  250 mg Oral BID  . tiotropium  1 capsule Inhalation Daily  . vancomycin  500 mg Oral Q6H   Continuous Infusions: . dextrose 5 % and 0.45% NaCl 1,000 mL with sodium  bicarbonate 50 mEq infusion 125 mL/hr at 11/07/15 1909  . furosemide (LASIX) infusion 8 mg/hr (11/07/15 0600)     LOS: 9 days    Time spent: 25 minutes    Merry Proud, MD [facility]  If 7PM-7AM, please contact night-coverage [email or web address] Password [email or web address] 11/08/2015, 7:03 AM   By signing my name below, I, Delene Ruffini, attest that this documentation has been prepared under the direction and in the presence of Kathie Dike, MD. Electronically Signed: Delene Ruffini 11/08/15 9:35am  I, Dr. Kathie Dike, personally performed the services described in this documentaiton. All medical record entries made by the scribe were at my direction and in my presence. I have reviewed the chart and agree that the record reflects my personal performance and is accurate and complete  Kathie Dike, MD, 11/08/2015 10:00 AM

## 2015-11-08 NOTE — Evaluation (Signed)
Physical Therapy Evaluation Patient Details Name: Deborah Murray MRN: [id number] DOB: 06/02/1960 Today's Date: 11/08/2015   History of Present Illness  55 yo F admitted 10/30/2015 due to persistent abdominal pain and diarrhea.  She underwent back surgery 2 weeks ago at [facility].  Dx: pancolitis due to C-diff.  PMH: obesity, DJD, severe asthma, allergies, HTN, chronic back pain, Afib, and recent back surgery, syncope and collapse, thyroid disease, vocal cord dysfunction, lumbar radiculopathy, adrenal abnormality, COPD, gastric bypass, cholecystectomy, R knee surgery.  Clinical Impression  Pt received in bed, sister present, and pt is agreeable to PT evaluation. Pt states that she was using a RW for ambulation at home since her lumbar surgery, and her dtr had been staying with her - she has since returned to her house in [place], but plans to return and stay with the pt upon d/c from this hospitalization.  During PT today, pt expressed dizziness upon sitting on the EOB.  She tolerated sitting on the edge x 5 min while performing LE exercises.  At this point, she required Mod/Max A for bed mobility.  Will continue to progress OOB mobility as she is able.  There is a possibility that she will need SNF if her dtr is unable to stay with her, or she is not able to transfer and ambulate well.      Follow Up Recommendations Home health PT;Supervision/Assistance - 24 hour    Equipment Recommendations  3in1 (PT)    Recommendations for Other Services       Precautions / Restrictions Precautions Precautions: Back;Other (comment) (Rectal tube) Precaution Booklet Issued: No Precaution Comments: Recent spinal fusion 2 weeks prior to admission.  Restrictions Weight Bearing Restrictions: No      Mobility  Bed Mobility Overal bed mobility: Needs Assistance Bed Mobility: Supine to Sit;Sit to Sidelying;Rolling Rolling: Modified independent (Device/Increase time) (with use of bed rails. )   Supine to sit:  Max assist Sit to supine:  (To lift LE's into the bed.  ) Sit to sidelying: Mod assist General bed mobility comments: Once sitting on the EOB, pt expressed feeling dizzy.  Pt states that she has not been OOB in ~1week.  Pt was able to sit on the EOB for ~5 min and do some LE exercises.  Pt expressed need to return back to bed due to continued dizziness.  No BP cuff available in this isolation room.   Transfers                    Ambulation/Gait                Stairs            Wheelchair Mobility    Modified Rankin (Stroke Patients Only)       Balance Overall balance assessment: Needs assistance Sitting-balance support: Bilateral upper extremity supported Sitting balance-Leahy Scale: Good                                       Pertinent Vitals/Pain Pain Assessment: 0-10 Pain Score: 10-Worst pain ever Pain Location: in vagina Pain Intervention(s): Limited activity within patient's tolerance    Home Living   Living Arrangements: Alone Available Help at Discharge: Friend(s);Family (Sister - helps with food) Type of Home: House Home Access: Stairs to enter Entrance Stairs-Rails: Left Entrance Stairs-Number of Steps: 5 on the front. , and 2 going in the back.  Home Layout: One level Home Equipment: Walker - 2 wheels Additional Comments: Pt states that her dtr plans to stay with her at home 24/7 once she is discharged from the hospital.     Prior Function     Gait / Transfers Assistance Needed: Pt using RW since back surgery  ADL's / Homemaking Assistance Needed: Pt was independent with dressing and bathing.         Hand Dominance   Dominant Hand: Right    Extremity/Trunk Assessment   Upper Extremity Assessment: Generalized weakness           Lower Extremity Assessment: Generalized weakness         Communication      Cognition Arousal/Alertness: Awake/alert Behavior During Therapy: WFL for tasks  assessed/performed Overall Cognitive Status: Within Functional Limits for tasks assessed                      General Comments      Exercises General Exercises - Lower Extremity Ankle Circles/Pumps: AROM;Both;20 reps;Seated Long Arc Quad: Strengthening;Both;10 reps;Seated Hip Flexion/Marching: Strengthening;Both;5 reps;Seated;Limitations Hip Flexion/Marching Limitations: Body habitus      Assessment/Plan    PT Assessment Patient needs continued PT services  PT Diagnosis Difficulty walking;Abnormality of gait;Generalized weakness   PT Problem List Decreased strength;Decreased range of motion;Decreased activity tolerance;Decreased balance;Decreased mobility;Decreased knowledge of use of DME;Decreased safety awareness;Decreased knowledge of precautions;Obesity  PT Treatment Interventions Gait training;DME instruction;Functional mobility training;Therapeutic activities;Therapeutic exercise;Balance training;Patient/family education   PT Goals (Current goals can be found in the Care Plan section) Acute Rehab PT Goals Patient Stated Goal: To go home PT Goal Formulation: With patient/family Time For Goal Achievement: 11/15/15 Potential to Achieve Goals: Good    Frequency Min 5X/week   Barriers to discharge        Co-evaluation               End of Session   Activity Tolerance: Patient tolerated treatment well Patient left: in bed;with call bell/phone within reach;with family/visitor present Nurse Communication: Mobility status    Functional Assessment Tool Used: [employer] "6-clicks"  Functional Limitation: Mobility: Walking and moving around Mobility: Walking and Moving Around Current Status [phone]): At least 60 percent but less than 80 percent impaired, limited or restricted Mobility: Walking and Moving Around Goal Status [phone]): At least 40 percent but less than 60 percent impaired, limited or restricted    Time: 1426-1500 PT Time Calculation  (min) (ACUTE ONLY): 34 min   Charges:   PT Evaluation $PT Eval Low Complexity: 1 Procedure PT Treatments $Therapeutic Exercise: 8-22 mins   PT G Codes:   PT G-Codes **NOT FOR INPATIENT CLASS** Functional Assessment Tool Used: [employer] "6-clicks"  Functional Limitation: Mobility: Walking and moving around Mobility: Walking and Moving Around Current Status [phone]): At least 60 percent but less than 80 percent impaired, limited or restricted Mobility: Walking and Moving Around Goal Status [phone]): At least 40 percent but less than 60 percent impaired, limited or restricted    [employer] 11/08/2015, 4:07 PM

## 2015-11-08 NOTE — Progress Notes (Signed)
Subjective: Interval History: Patient complains of poor appetite and some nausea. She didn't have any abdominal pain. She is feeling somewhat better.  Objective: Vital signs in last 24 hours: Temp:  [97.6 F (36.4 C)-98.1 F (36.7 C)] 97.6 F (36.4 C) (07/28 0447) Pulse Rate:  [72-99] 99 (07/28 0447) Resp:  [18-20] 18 (07/28 0447) BP: (140-188)/(99-114) 143/102 (07/28 0447) SpO2:  [96 %-100 %] 96 % (07/28 0728) Weight:  [135 kg (297 lb 9.9 oz)] 135 kg (297 lb 9.9 oz) (07/28 0447) Weight change: 2.6 kg (5 lb 11.7 oz)  Intake/Output from previous day: 07/27 0701 - 07/28 0700 In: 600 [P.O.:600] Out: 1425 [Urine:1325; Stool:100] Intake/Output this shift: No intake/output data recorded.  General appearance: alert, cooperative and no distress Resp: clear to auscultation bilaterally Cardio: regular rate and rhythm, S1, S2 normal, no murmur, click, rub or gallop GI: Patient with some abdominal tenderness but no rebound tenderness. Positive bowel sounds Extremities: edema 1-2+ edema  Lab Results:  Recent Labs  11/07/15 0410 11/08/15 0500  WBC 19.1* 23.2*  HGB 11.5* 12.9  HCT 32.9* 37.7  PLT 191 214   BMET:   Recent Labs  11/07/15 0410 11/08/15 0500  NA 133* 133*  K 2.8* 3.3*  CL 101 102  CO2 22 23  GLUCOSE 113* 120*  BUN 32* 30*  CREATININE 1.60* 1.64*  CALCIUM 7.6* 7.7*   No results for input(s): PTH in the last 72 hours. Iron Studies: No results for input(s): IRON, TIBC, TRANSFERRIN, FERRITIN in the last 72 hours.  Studies/Results: Dg Abd 2 Views  Result Date: 11/07/2015 CLINICAL DATA:  Diffuse abdominal pain for 1 week. EXAM: ABDOMEN - 2 VIEW COMPARISON:  11/04/2015 and CT, 11/02/2015. FINDINGS: Bowel small bowel prominence is noted, without air-fluid levels noted on the decubitus view. There is no evidence of obstruction. There is no convincing free air. Clips are upper quadrant reflect a prior cholecystectomy. There changes from a previous lower lumbar spine  posterior fusion. IMPRESSION: 1. No evidence of bowel obstruction, significant adynamic ileus or free air. Electronically Signed   By: Lajean Manes M.D.   On: 11/07/2015 17:31   I have reviewed the patient's current medications.  Assessment/Plan: Problem #1 acute kidney injury: Her renal function is slightly high this morning otherwise seems to be overall stable. Problem #2 . Pancolitis: Still patient continue with diarrhea. Presently her blood pressure is better but her white blood cell count is increasing. Problem #3 history of asthma: She is on inhalers. Patient does not have any complaints.  Problem #4 low CO2: Respiratory alkalosis. has corrected. Problem #5 hypophosphatemia: Her phosphorus is normal Problem #6 hypokalemia: . Potassium is low. Today' 3.3. She is on potassium and seems to be improving. Problem #7 hypocalcemia on calcium supplement. Her calcium is normal. Problem #8 anasarca: patient on Lasix drip . Still she has some edema. Problem #9 hypotension: Her blood pressure is much better. Plan: 1] we'll DC continuous IV Lasix 2] we'll start her on Lasix 120 mg IV twice a day 3] we'll continue his potassium supplement 4] we'll check compressive metabolic panel in the morning.   LOS: 9 days   Shearon Clonch S 11/08/2015,9:27 AM

## 2015-11-08 NOTE — Progress Notes (Signed)
Subjective: Continued nausea and abdominal pain. No vomiting. Continued liquid stool output. Pain medication haps her pain. Nausea medication does not help her nausea as much. No other complaints at this time.  Objective: Vital signs in last 24 hours: Temp:  [97.6 F (36.4 C)-98.1 F (36.7 C)] 97.6 F (36.4 C) (07/28 0447) Pulse Rate:  [72-99] 99 (07/28 0447) Resp:  [18-20] 18 (07/28 0447) BP: (140-188)/(99-114) 143/102 (07/28 0447) SpO2:  [96 %-100 %] 96 % (07/28 0728) Weight:  [297 lb 9.9 oz (135 kg)] 297 lb 9.9 oz (135 kg) (07/28 0447) Last BM Date: 11/08/15 General:   Alert and oriented, pleasant Eyes:  No icterus, sclera clear. Conjuctiva pink.  Heart:  S1, S2 present, no murmurs noted.  Lungs: Clear to auscultation bilaterally, without wheezing, rales, or rhonchi.  Abdomen:  Bowel sounds present, obese but soft, non-distended. Moderate TTP to palpation all quadrants. No rebound or guarding. Pulses:  Normal bilateral DP pulses noted. Extremities:  2-3+ pitting edema bilaterally. Neurologic:  Alert and  oriented x4;  grossly normal neurologically. Skin:  Warm and dry, intact without significant lesions.  Psych:  Alert and cooperative. Normal mood and affect.  Intake/Output from previous day: 07/27 0701 - 07/28 0700 In: 600 [P.O.:600] Out: 1425 [Urine:1325; Stool:100] Intake/Output this shift: No intake/output data recorded.  Lab Results:  Recent Labs  11/06/15 0425 11/07/15 0410 11/08/15 0500  WBC 18.2* 19.1* 23.2*  HGB 11.2* 11.5* 12.9  HCT 32.7* 32.9* 37.7  PLT 201 191 214   BMET  Recent Labs  11/06/15 0425 11/07/15 0410 11/08/15 0500  NA 129* 133* 133*  K 3.7 2.8* 3.3*  CL 101 101 102  CO2 21* 22 23  GLUCOSE 120* 113* 120*  BUN 36* 32* 30*  CREATININE 1.83* 1.60* 1.64*  CALCIUM 7.5* 7.6* 7.7*   LFT  Recent Labs  11/06/15 0425 11/07/15 0410 11/08/15 0500  ALBUMIN 2.5* 2.5* 2.6*   PT/INR No results for input(s): LABPROT, INR in the  last 72 hours. Hepatitis Panel No results for input(s): HEPBSAG, HCVAB, HEPAIGM, HEPBIGM in the last 72 hours.   Studies/Results: Dg Abd 2 Views  Result Date: 11/07/2015 CLINICAL DATA:  Diffuse abdominal pain for 1 week. EXAM: ABDOMEN - 2 VIEW COMPARISON:  11/04/2015 and CT, 11/02/2015. FINDINGS: Bowel small bowel prominence is noted, without air-fluid levels noted on the decubitus view. There is no evidence of obstruction. There is no convincing free air. Clips are upper quadrant reflect a prior cholecystectomy. There changes from a previous lower lumbar spine posterior fusion. IMPRESSION: 1. No evidence of bowel obstruction, significant adynamic ileus or free air. Electronically Signed   By: Amie Portland M.D.   On: 11/07/2015 17:31   Assessment: 55 year old female with severe/complicated Cdiff colitis that has been slowly responding to therapy. Today CBC with continued leucocytosis; Renal function: baseline sodium, continued low potassium, improved Cr 1.64, persistently low (baseline) Ca, continued hypoalbuminemia.  Adrenal insufficiency- Transitioned to oral prednisone yesterday. Continued elevated WBC today despite clinical improvement ? Steroid effect. Afebrile, no back pain (recent back surgery). No other obvious source of infection  CDiff colitis- remains with rectal tube per patient request; increased abdominal pain and decreased po intake yesterday. Had dairy on her tray (which she consumed) despite request for lactose-free diet. Address with dietary yesterday via diet order. Nausea likely due to antibiotics. Abdominal XRay yesterday without obstruction, significant adynamic ileus, or free air. Stool output from 1900 yesterday to 0700 this morning and green/light brown liquid stool  noted in rectal tubing and bag; not recorded yesterday. Antibiotic taper in note dated 11/06/15.  Lactose intolerance: Patient states milk was brought on her tray again this morning, although she did not  consume it. Will address with dietary.  Anasarca- Continued significant LE edema. Acute illness low albumin. appreciate nutrition assistance. Will add Phenergan for better nausea control and hopefully better po intake. Address lactose free orders with dietary  Discussed with Dr. Roderic Palau who plans to taper down steroids to see if this helps her leucocytosis.   Plan: 1. Call placed to dietary to discuss lactose free diet; will be addressed 2. Continue to monitor for any changes or indication of worsening infection or complication such as obstruction or perforation 3. Will add prn Phenergan for improved nausea control 4. Monitor WBC closely 5. Supportive measures   Walden Field, AGNP-C Adult & Gerontological Nurse Practitioner [facility]     LOS: 9 days    11/08/2015, 8:33 AM

## 2015-11-09 LAB — COMPREHENSIVE METABOLIC PANEL
ALBUMIN: 2.4 g/dL — AB (ref 3.5–5.0)
ALK PHOS: 64 U/L (ref 38–126)
ALT: 12 U/L — ABNORMAL LOW (ref 14–54)
ANION GAP: 8 (ref 5–15)
AST: 23 U/L (ref 15–41)
BUN: 30 mg/dL — ABNORMAL HIGH (ref 6–20)
CALCIUM: 7.7 mg/dL — AB (ref 8.9–10.3)
CHLORIDE: 103 mmol/L (ref 101–111)
CO2: 24 mmol/L (ref 22–32)
Creatinine, Ser: 1.64 mg/dL — ABNORMAL HIGH (ref 0.44–1.00)
GFR calc non Af Amer: 34 mL/min — ABNORMAL LOW (ref >60)
GFR, EST AFRICAN AMERICAN: 39 mL/min — AB (ref >60)
GLUCOSE: 117 mg/dL — AB (ref 65–99)
POTASSIUM: 3.6 mmol/L (ref 3.5–5.1)
SODIUM: 135 mmol/L (ref 135–145)
Total Bilirubin: 0.4 mg/dL (ref 0.3–1.2)
Total Protein: 4.3 g/dL — ABNORMAL LOW (ref 6.5–8.1)

## 2015-11-09 LAB — CBC
HEMATOCRIT: 38.1 % (ref 36.0–46.0)
HEMOGLOBIN: 13 g/dL (ref 12.0–15.0)
MCH: 28.4 pg (ref 26.0–34.0)
MCHC: 34.1 g/dL (ref 30.0–36.0)
MCV: 83.2 fL (ref 78.0–100.0)
Platelets: 203 10*3/uL (ref 150–400)
RBC: 4.58 MIL/uL (ref 3.87–5.11)
RDW: 16.4 % — ABNORMAL HIGH (ref 11.5–15.5)
WBC: 19.2 10*3/uL — ABNORMAL HIGH (ref 4.0–10.5)

## 2015-11-09 LAB — RENAL FUNCTION PANEL
ANION GAP: 9 (ref 5–15)
Albumin: 2.4 g/dL — ABNORMAL LOW (ref 3.5–5.0)
BUN: 29 mg/dL — ABNORMAL HIGH (ref 6–20)
CALCIUM: 7.7 mg/dL — AB (ref 8.9–10.3)
CHLORIDE: 103 mmol/L (ref 101–111)
CO2: 23 mmol/L (ref 22–32)
CREATININE: 1.68 mg/dL — AB (ref 0.44–1.00)
GFR calc non Af Amer: 33 mL/min — ABNORMAL LOW (ref >60)
GFR, EST AFRICAN AMERICAN: 38 mL/min — AB (ref >60)
Glucose, Bld: 118 mg/dL — ABNORMAL HIGH (ref 65–99)
PHOSPHORUS: 3.1 mg/dL (ref 2.5–4.6)
POTASSIUM: 3.5 mmol/L (ref 3.5–5.1)
Sodium: 135 mmol/L (ref 135–145)

## 2015-11-09 MED ORDER — FAMOTIDINE 20 MG PO TABS: 20.0000 mg | ORAL_TABLET | Freq: Two times a day (BID) | ORAL | Status: DC | PRN

## 2015-11-09 MED ORDER — METRONIDAZOLE 500 MG PO TABS
500.0000 mg | ORAL_TABLET | Freq: Three times a day (TID) | ORAL | Status: DC
  Administered 2015-11-09 – 2015-11-10 (×3): 500 mg via ORAL
  Filled 2015-11-09 (×3): qty 1

## 2015-11-09 NOTE — Progress Notes (Signed)
Subjective: Interval History: Patient does not offer any new complaints. Her appetite is better but she has some nausea.   Objective: Vital signs in isn't requiring me I try always a st 24 hours: Temp:  [97.6 F (36.4 C)-99.3 F (37.4 C)] 98 F (36.7 C) (07/29 0625) Pulse Rate:  [105-108] 105 (07/29 0625) Resp:  [18-20] 18 (07/29 0625) BP: (110-137)/(74-101) 110/84 (07/29 0625) SpO2:  [95 %-98 %] 98 % (07/29 0625) Weight:  [134.7 kg (296 lb 14.4 oz)] 134.7 kg (296 lb 14.4 oz) (07/29 0625) Weight change: -0.327 kg (-11.5 oz)  Intake/Output from previous day: 07/28 0701 - 07/29 0700 In: 100 [IV Piggyback:100] Out: 1050 [Urine:1050] Intake/Output this shift: No intake/output data recorded.  General appearance: alert, cooperative and no distress Resp: clear to auscultation bilaterally Cardio: regular rate and rhythm, S1, S2 normal, no murmur, click, rub or gallop GI: Patient with some abdominal tenderness but no rebound tenderness. Positive bowel sounds Extremities: edema 1-2+ edema  Lab Results:  Recent Labs  11/08/15 0500 11/09/15 0700  WBC 23.2* 19.2*  HGB 12.9 13.0  HCT 37.7 38.1  PLT 214 203   BMET:   Recent Labs  11/08/15 0500 11/09/15 0700  NA 133* 135  135  K 3.3* 3.5  3.6  CL 102 103  103  CO2 23 23  24   GLUCOSE 120* 118*  117*  BUN 30* 29*  30*  CREATININE 1.64* 1.68*  1.64*  CALCIUM 7.7* 7.7*  7.7*   No results for input(s): PTH in the last 72 hours. Iron Studies: No results for input(s): IRON, TIBC, TRANSFERRIN, FERRITIN in the last 72 hours.  Studies/Results: Dg Abd 2 Views  Result Date: 11/07/2015 CLINICAL DATA:  Diffuse abdominal pain for 1 week. EXAM: ABDOMEN - 2 VIEW COMPARISON:  11/04/2015 and CT, 11/02/2015. FINDINGS: Bowel small bowel prominence is noted, without air-fluid levels noted on the decubitus view. There is no evidence of obstruction. There is no convincing free air. Clips are upper quadrant reflect a prior  cholecystectomy. There changes from a previous lower lumbar spine posterior fusion. IMPRESSION: 1. No evidence of bowel obstruction, significant adynamic ileus or free air. Electronically Signed   By: Lajean Manes M.D.   On: 11/07/2015 17:31   I have reviewed the patient's current medications.  Assessment/Plan: Problem #1 acute kidney injury: Her renal function doesn't show any significant change. Patient is non-oliguric.  Problem #2 . Pancolitis: Still patient continue with diarrhea.  Problem #3 history of asthma: She is on inhalers. Patient does not have any complaints.  Problem #4 low CO2: Respiratory alkalosis. has corrected. Problem #5 hypophosphatemia: Her phosphorus is normal Problem #6 hypokalemia: . Potassium is has corrected.  Problem #7 hypocalcemia on calcium supplement. Her calcium is normal. Problem #8 anasarca: patient on Lasix she has about 1 L of urine output . Problem #9 hypotension: Her blood pressure is much better. Plan: 1] we'll continue his present management  2] we'll check compressive metabolic panel in the morning.   LOS: 10 days   Sophira Rumler S 11/09/2015,8:35 AM

## 2015-11-09 NOTE — Progress Notes (Addendum)
PROGRESS NOTE    Deborah Murray  [id number] DOB: [birth year] DOA: 10/30/2015 PCP: Sol Passer, MD  Outpatient Specialists:  Gastroenterology: Daneil Dolin, MD  Brief Narrative: 40 yof with hx of A-fib, HTN, severe asthma/allergies, adrenal insufficiency, and recent back surgery, presented with complaints of abd pain and diarrhea that onset 2 weeks ago from her back surgery. She reported having loose stool once she arrived at home. Also, she reported having a fever and feeling very weak at home. While in the ED, temperature 102.3, BP normal, and WBC 23.7. CT abd/pelvis showed diffuse edematous wall thickening in the colon, from the cecal tip to the rectum associated with diffuse pericolonic edema/ inflammation which is compatible with pancolitis. C. Diff toxin positive. GI pathogen panel negative. Follow up CT abd/pelvis showed worsening pancolitis. Surgery and GI were consulted. Nephrology was consulted for her acute renal failure. PT consulted, recommended HH with PT. Additionally, due to increasing WBC count, repeat blood cultures were ordered to rule out any infection. She will need several more days of treatment.   Assessment & Plan:   Principal Problem:   Pancolitis ([place]) Active Problems:   Hypokalemia   Essential hypertension   Severe persistent asthma   Morbid obesity (HCC)   C. difficile diarrhea   Hypotension   History of atrial fibrillation- NSR now   History of adrenal insufficiency   History of hypertension   C. difficile colitis   Metabolic acidosis   Hypocalcemia   Acute kidney injury ([place])   Hyponatremia   Sepsis (HCC)   AKI (acute kidney injury) ([place])   Pyrexia   Protein-calorie malnutrition, severe   1. Pancolitis secondary to C. Diff colitis. C. Diff PCR positive. GI pathogen negative. General surgery consult appreciated. It was not felt that surgical intervention was necessary at this time. GI consult appreciated. Advance diet as tolerated. PT has  evaluated the patient and recommends HH PT and 24 supervision. Continue oral Vanc and Flagyl. Continue florastor. She complains of continued nausea and abd pain. Will continue anti-emetics. Abd xray form 7/27 was unremarkable.  2. Essential HTN, was hypotensive on admission. Outpatient meds were held. Hypotension resolved with IV fluids. She briefly required neo-synephrine but has been weaned off. Continue IV Fluids.  3. AKI. Nephrology has been consulted. Continue to monitor in the setting of IV fluid and lasix. Continue IV lasix twice a day. Follow up CMP in the AM. 4. Atrial fibrillation. Currently in NSR. CHADS score 2. Pt was on Xarelto prior to admission. Will resume on discharge once acute issues are resolved. 5. Leukocytosis. WBC count 19.2. Patient is afebrile. Possibly related to steroids. Continue prednisone at chronic dose of 15 mg daily. and repeat CBC in the AM. Follow up repeat BC. 6. Adrenal insufficiency. Pt is chronically on prednisone, but since being in the hospital has been receiving stress dose of hydrocortisone. Since she has clinically improved has been transitioned back to baseline prednisone dosing. 7. Hypokalemia. Improving.  8. Hyponatremia. Sodium improving. Continue lasix 9. Hypocalcemia. When calcium is corrected for hypoalbumenemia, calcium is wnl. 10. Severe malnutrition in the context of acute illness/injury. Nutrition following. 11. Morbid obesity. Nutrition following    DVT prophylaxis: SQ heparin Code Status: Full Family Communication: Discussed with patient and sister present at bedside. Disposition Plan: Discharge once improved    Consultants:   GI  Nutrition   General surgery  Nephrology  PT  Procedures:   None  Antimicrobials:   Vanc   7/23 >>  Metronidazole  7/19 >>  Subjective: Feels well today. Per sister, patient has been having continued diarrhea.   Objective: Vitals:   11/08/15 2135 11/08/15 2150 11/09/15 0000 11/09/15 0625   BP: (!) 137/101 (!) 124/100 (!) 120/98 (P) 110/84  Pulse: (!) 108   (!) (P) 105  Resp: 20   (P) 18  Temp: 99.3 F (37.4 C)   (P) 98 F (36.7 C)  TempSrc: Oral   (P) Oral  SpO2: 98%   (P) 98%  Weight:      Height:        Intake/Output Summary (Last 24 hours) at 11/09/15 0643 Last data filed at 11/09/15 0246  Gross per 24 hour  Intake                0 ml  Output             1050 ml  Net            -1050 ml   Filed Weights   11/06/15 0500 11/07/15 0500 11/08/15 0447  Weight: 130 kg (286 lb 9.6 oz) 132.4 kg (291 lb 14.2 oz) 135 kg (297 lb 9.9 oz)   Examination:  General exam: Appears calm and comfortable  Respiratory system: Clear to auscultation. Respiratory effort normal. Cardiovascular system: S1 & S2 heard, RRR. No JVD, murmurs, rubs, gallops or clicks. 2+ pedal edema. Gastrointestinal system: Abdomen is nondistended, soft and nontender. No organomegaly or masses felt. Normal bowel sounds heard. Central nervous system: Alert and oriented. No focal neurological deficits. Extremities: Symmetric 5 x 5 power. Skin: No rashes, lesions or ulcers Psychiatry: Judgement and insight appear normal. Mood & affect appropriate.   Data Reviewed:  CBC:  Recent Labs Lab 11/04/15 0439 11/05/15 0440 11/06/15 0425 11/07/15 0410 11/08/15 0500  WBC 24.4* 18.3* 18.2* 19.1* 23.2*  HGB 10.8* 11.1* 11.2* 11.5* 12.9  HCT 32.2* 32.5* 32.7* 32.9* 37.7  MCV 83.9 84.2 82.8 82.3 82.7  PLT 235 198 201 191 [id number]   Basic Metabolic Panel:  Recent Labs Lab 11/03/15 0328 11/03/15 0806  11/04/15 0439 11/04/15 1627 11/05/15 0440 11/05/15 1645 11/06/15 0425 11/07/15 0410 11/08/15 0500  NA  --  131*  --  130* 126*  --  126* 129* 133* 133*  K  --  4.1  --  4.4 4.5  --  3.8 3.7 2.8* 3.3*  CL  --  104  --  102 99*  --  101 101 101 102  CO2  --  19*  --  19* 19*  --  20* 21* 22 23  GLUCOSE  --  119*  --  131* 130*  --  135* 120* 113* 120*  BUN  --  37*  --  38* 37*  --  37* 36* 32* 30*    CREATININE  --  2.10*  --  1.95* 1.93*  --  1.85* 1.83* 1.60* 1.64*  CALCIUM  --  6.8*  --  7.9* 7.9*  --  7.6* 7.5* 7.6* 7.7*  MG 2.1 2.1  --  2.1  --  2.0  --  1.8  --   --   PHOS  --   --   < > 4.2 4.4  --  4.3 4.2 3.7 3.3  < > = values in this interval not displayed. GFR: Estimated Creatinine Clearance: 50.2 mL/min (by C-G formula based on SCr of 1.64 mg/dL). Liver Function Tests:  Recent Labs Lab 11/04/15 0439 11/04/15 1627 11/05/15 1645 11/06/15 0425 11/07/15 0410 11/08/15  0500  AST 23  --   --   --   --   --   ALT 10*  --   --   --   --   --   ALKPHOS 62  --   --   --   --   --   BILITOT 0.3  --   --   --   --   --   PROT 4.3*  --   --   --   --   --   ALBUMIN 2.6* 2.9* 2.6* 2.5* 2.5* 2.6*   Cardiac Enzymes:  Recent Labs Lab 11/03/15 0957  CKTOTAL 36*   Urine analysis:    Component Value Date/Time   COLORURINE BROWN (A) 11/02/2015 0525   APPEARANCEUR CLOUDY (A) 11/02/2015 0525   LABSPEC 1.025 11/02/2015 0525   PHURINE 5.0 11/02/2015 0525   GLUCOSEU 100 (A) 11/02/2015 0525   HGBUR LARGE (A) 11/02/2015 0525   BILIRUBINUR MODERATE (A) 11/02/2015 0525   KETONESUR 15 (A) 11/02/2015 0525   PROTEINUR 100 (A) 11/02/2015 0525   UROBILINOGEN 1.0 05/12/2014 1319   NITRITE POSITIVE (A) 11/02/2015 0525   LEUKOCYTESUR TRACE (A) 11/02/2015 0525   Sepsis Labs: @LABRCNTIP (procalcitonin:4,lacticidven:4)  ) Recent Results (from the past 240 hour(s))  Urine culture     Status: None   Collection Time: 10/30/15 12:38 PM  Result Value Ref Range Status   Specimen Description URINE, CATHETERIZED  Final   Special Requests NONE  Final   Culture NO GROWTH Performed at [facility]   Final   Report Status 10/31/2015 FINAL  Final  Culture, blood (routine x 2)     Status: None   Collection Time: 10/30/15 12:39 PM  Result Value Ref Range Status   Specimen Description BLOOD LEFT ANTECUBITAL  Final   Special Requests BOTTLES DRAWN AEROBIC AND ANAEROBIC 10CC EACH  Final    Culture NO GROWTH 5 DAYS  Final   Report Status 11/04/2015 FINAL  Final  Culture, blood (routine x 2)     Status: None   Collection Time: 10/30/15 12:53 PM  Result Value Ref Range Status   Specimen Description BLOOD RIGHT ANTECUBITAL  Final   Special Requests BOTTLES DRAWN AEROBIC AND ANAEROBIC 6CC EACH  Final   Culture NO GROWTH 5 DAYS  Final   Report Status 11/04/2015 FINAL  Final  Gastrointestinal Panel by PCR , Stool     Status: None   Collection Time: 10/31/15  2:21 AM  Result Value Ref Range Status   Campylobacter species NOT DETECTED NOT DETECTED Final   Plesimonas shigelloides NOT DETECTED NOT DETECTED Final   Salmonella species NOT DETECTED NOT DETECTED Final   Yersinia enterocolitica NOT DETECTED NOT DETECTED Final   Vibrio species NOT DETECTED NOT DETECTED Final   Vibrio cholerae NOT DETECTED NOT DETECTED Final   Enteroaggregative E coli (EAEC) NOT DETECTED NOT DETECTED Final   Enteropathogenic E coli (EPEC) NOT DETECTED NOT DETECTED Final   Enterotoxigenic E coli (ETEC) NOT DETECTED NOT DETECTED Final   Shiga like toxin producing E coli (STEC) NOT DETECTED NOT DETECTED Final   E. coli O157 NOT DETECTED NOT DETECTED Final   Shigella/Enteroinvasive E coli (EIEC) NOT DETECTED NOT DETECTED Final   Cryptosporidium NOT DETECTED NOT DETECTED Final   Cyclospora cayetanensis NOT DETECTED NOT DETECTED Final   Entamoeba histolytica NOT DETECTED NOT DETECTED Final   Giardia lamblia NOT DETECTED NOT DETECTED Final   Adenovirus F40/41 NOT DETECTED NOT DETECTED Final   Astrovirus  NOT DETECTED NOT DETECTED Final   Norovirus GI/GII NOT DETECTED NOT DETECTED Final   Rotavirus A NOT DETECTED NOT DETECTED Final   Sapovirus (I, II, IV, and V) NOT DETECTED NOT DETECTED Final  C difficile quick scan w PCR reflex     Status: Abnormal   Collection Time: 10/31/15  2:21 AM  Result Value Ref Range Status   C Diff antigen POSITIVE (A) NEGATIVE Final   C Diff toxin POSITIVE (A) NEGATIVE Final    C Diff interpretation Toxin producing C. difficile detected.  Final    Comment: CRITICAL RESULT CALLED TO, READ BACK BY AND VERIFIED WITH: Marylouise Stacks AT [id number] ON [id number] BY FORSYTH K   Stool culture (children & immunocomp patients)     Status: None   Collection Time: 10/31/15  2:21 AM  Result Value Ref Range Status   Salmonella/Shigella Screen Final report  Final   Campylobacter Culture Final report  Final   E coli, Shiga toxin Assay Negative Negative Final    Comment: (NOTE) Performed At: [facility] [place], [place] [id number] Lindon Romp MD [id number]   STOOL CULTURE REFLEX - RSASHR     Status: None   Collection Time: 10/31/15  2:21 AM  Result Value Ref Range Status   Stool Culture result 1 (RSASHR) Comment  Final    Comment: (NOTE) No Salmonella or Shigella recovered. Performed At: [facility] [address] [place], [place] [id number] Lindon Romp MD [id number]   STOOL CULTURE Reflex - CMPCXR     Status: None   Collection Time: 10/31/15  2:21 AM  Result Value Ref Range Status   Stool Culture result 1 (CMPCXR) Comment  Final    Comment: (NOTE) No Campylobacter species isolated. Performed At: [facility] [place], [place] [id number] Lindon Romp MD [id number]   Culture, Urine     Status: None   Collection Time: 11/02/15  4:02 PM  Result Value Ref Range Status   Specimen Description URINE, CATHETERIZED  Final   Special Requests Immunocompromised  Final   Culture NO GROWTH Performed at [facility]   Final   Report Status 11/03/2015 FINAL  Final  MRSA PCR Screening     Status: None   Collection Time: 11/03/15 12:10 PM  Result Value Ref Range Status   MRSA by PCR NEGATIVE NEGATIVE Final    Comment:        The GeneXpert MRSA Assay (FDA approved for NASAL specimens only), is one component of a comprehensive MRSA colonization surveillance program. It is not intended to diagnose  MRSA infection nor to guide or monitor treatment for MRSA infections.     Scheduled Meds: . budesonide  0.5 mg Nebulization BID  . citalopram  20 mg Oral QHS  . DULoxetine  30 mg Oral Daily  . famotidine  20 mg Oral BID  . feeding supplement  1 Container Oral TID BM  . feeding supplement (PRO-STAT SUGAR FREE 64)  30 mL Oral BID  . furosemide  120 mg Intravenous BID  . gabapentin  600 mg Oral TID  . heparin subcutaneous  5,000 Units Subcutaneous Q8H  . latanoprost  1 drop Both Eyes QHS  . loratadine  10 mg Oral Daily  . metronidazole  500 mg Intravenous Q8H  . montelukast  10 mg Oral QHS  . nystatin  5 mL Oral QID  . potassium chloride  40 mEq Oral BID  . predniSONE  15 mg Oral  Q breakfast  . promethazine  12.5 mg Oral TID AC & HS  . saccharomyces boulardii  250 mg Oral BID  . tiotropium  1 capsule Inhalation Daily  . vancomycin  500 mg Oral Q6H   Continuous Infusions: . dextrose 5 % and 0.45% NaCl 1,000 mL with sodium bicarbonate 50 mEq infusion 125 mL/hr at 11/09/15 0222     LOS: 10 days    Time spent: 25 minutes    Merry Proud, MD [facility]  If 7PM-7AM, please contact night-coverage [email or web address] Password [email or web address] 11/09/2015, 6:43 AM   By signing my name below, I, Delene Ruffini, attest that this documentation has been prepared under the direction and in the presence of Kathie Dike, MD. Electronically Signed: Delene Ruffini 11/09/15 2:20pm  I, Dr. Kathie Dike, personally performed the services described in this documentaiton. All medical record entries made by the scribe were at my direction and in my presence. I have reviewed the chart and agree that the record reflects my personal performance and is accurate and complete  Kathie Dike, MD, 11/09/2015 2:37 PM

## 2015-11-09 NOTE — Progress Notes (Signed)
Assumed care of pt. From off going RN. No changes in initial shift assessment. Pt resting quietly with eyes  closed. Sister at bedside with the patient. Cont with current plan of care.

## 2015-11-09 NOTE — Progress Notes (Signed)
   Assessment: ADMITTED WITH SEVERE C DIFF COLITIS WHILE TAKING STEROIDS FOR ADRENAL INSUFFICIENCY. PREDNISONE NOW DOWN TO MAINTENANCE DOSE. CONTINUES WITH LOOSE STOOLS. CLINICALLY IMPROVED. AFEBRILE. WBC TRENDING DOWN. CONTINUES WITH ANASARCA(WEIGHT UNCHANGED) AND ON LASIX DRIP WITH MIVFs @ 125 CC/HR. CR TRENDING DOWN.  PLAN: 1. CHANGE FLAGYL TO PO. CONTINUE VANC AND FLORASTOR. 2. REDUCE MIVFs TO 75 CC/HR. 3. PHENERGAN ATC AND ZOFRAN PRN. 4. CHANGE PEPCID TO PRN   Subjective: Since I last evaluated the patient HER NAUSEA IS BETTER. HER LOWER EXTREMITY SWELLING IS BETTER. ABDOMINAL PAIN BETTER. TOLERATING POs.  Objective: Vital signs in last 24 hours: Vitals:   11/09/15 0000 11/09/15 0625  BP: (!) 120/98 110/84  Pulse:  (!) 105  Resp:  18  Temp:  98 F (36.7 C)   General appearance: alert, cooperative and no distress Resp: clear to auscultation bilaterally Cardio: regular rate and rhythm GI: soft, MILD tenderNESS IN RUQ AND RLQ; bowel sounds normal; NO REBOUND OR GUARDING Extremities: edema 1+-3+  Lab Results:  WBC 19.2 Hb 13 Cr 1.68   Studies/Results: No results found.  Medications: I have reviewed the patient's current medications.   LOS: 5 days   Barney Drain 09/21/2013, 2:23 PM

## 2015-11-10 LAB — URINALYSIS, ROUTINE W REFLEX MICROSCOPIC
Bilirubin Urine: NEGATIVE
Glucose, UA: NEGATIVE mg/dL
Ketones, ur: NEGATIVE mg/dL
Leukocytes, UA: NEGATIVE
Nitrite: NEGATIVE
PH: 5.5 (ref 5.0–8.0)
SPECIFIC GRAVITY, URINE: 1.02 (ref 1.005–1.030)

## 2015-11-10 LAB — RENAL FUNCTION PANEL
ALBUMIN: 2.4 g/dL — AB (ref 3.5–5.0)
ANION GAP: 6 (ref 5–15)
BUN: 27 mg/dL — AB (ref 6–20)
CHLORIDE: 103 mmol/L (ref 101–111)
CO2: 23 mmol/L (ref 22–32)
Calcium: 7.6 mg/dL — ABNORMAL LOW (ref 8.9–10.3)
Creatinine, Ser: 1.57 mg/dL — ABNORMAL HIGH (ref 0.44–1.00)
GFR calc Af Amer: 41 mL/min — ABNORMAL LOW (ref >60)
GFR calc non Af Amer: 36 mL/min — ABNORMAL LOW (ref >60)
GLUCOSE: 104 mg/dL — AB (ref 65–99)
PHOSPHORUS: 3.3 mg/dL (ref 2.5–4.6)
POTASSIUM: 3.4 mmol/L — AB (ref 3.5–5.1)
Sodium: 132 mmol/L — ABNORMAL LOW (ref 135–145)

## 2015-11-10 LAB — CBC
HEMATOCRIT: 35.2 % — AB (ref 36.0–46.0)
HEMOGLOBIN: 12.1 g/dL (ref 12.0–15.0)
MCH: 29 pg (ref 26.0–34.0)
MCHC: 34.4 g/dL (ref 30.0–36.0)
MCV: 84.4 fL (ref 78.0–100.0)
Platelets: 229 10*3/uL (ref 150–400)
RBC: 4.17 MIL/uL (ref 3.87–5.11)
RDW: 16.6 % — ABNORMAL HIGH (ref 11.5–15.5)
WBC: 17.3 10*3/uL — AB (ref 4.0–10.5)

## 2015-11-10 LAB — URINE MICROSCOPIC-ADD ON

## 2015-11-10 LAB — GLUCOSE, CAPILLARY: Glucose-Capillary: 107 mg/dL — ABNORMAL HIGH (ref 65–99)

## 2015-11-10 NOTE — Progress Notes (Signed)
PROGRESS NOTE    Deborah Murray  [id number] DOB: April 12, 1961 DOA: 10/30/2015 PCP: Sol Passer, MD  Outpatient Specialists:  Gastroenterology: Daneil Dolin, MD  Brief Narrative: 53 yof with hx of A-fib, HTN, severe asthma/allergies, adrenal insufficiency, and recent back surgery, presented with complaints of abd pain and diarrhea that onset 2 weeks ago from her back surgery. She reported having loose stool once she arrived at home. Also, she reported having a fever and feeling very weak at home. While in the ED, temperature 102.3, BP normal, and WBC 23.7. CT abd/pelvis showed diffuse edematous wall thickening in the colon, from the cecal tip to the rectum associated with diffuse pericolonic edema/ inflammation which is compatible with pancolitis. C. Diff toxin positive. GI pathogen panel negative. Follow up CT abd/pelvis showed worsening pancolitis. Surgery and GI were consulted. Nephrology was consulted for her acute renal failure. PT consulted, recommended HH with PT. Additionally, due to increasing WBC count, repeat blood cultures were ordered to rule out any infection, but have remained negative. Acute stage of infection has resolved so fluids will be discontinued. Will continue IV lasix twice a day.She will need several more days of treatment.   Assessment & Plan:   Principal Problem:   Pancolitis ([place]) Active Problems:   Hypokalemia   Essential hypertension   Severe persistent asthma   Morbid obesity (HCC)   C. difficile diarrhea   Hypotension   History of atrial fibrillation- NSR now   History of adrenal insufficiency   History of hypertension   C. difficile colitis   Metabolic acidosis   Hypocalcemia   Acute kidney injury ([place])   Hyponatremia   Sepsis (HCC)   AKI (acute kidney injury) ([place])   Pyrexia   Protein-calorie malnutrition, severe   1. Pancolitis secondary to C. Diff colitis. C. Diff PCR positive. GI pathogen negative. General surgery consult  appreciated. It was not felt that surgical intervention was necessary at this time. GI consult appreciated. Advance diet as tolerated. PT has evaluated the patient and recommends HH PT and 24 supervision. Blood cultures negative 2 days. Continue oral Vanc. Discontinue flagyl. Continue florastor. She complains of continued nausea and abd pain. Will continue anti-emetics. Abd xray form 7/27 was unremarkable.  2. Essential HTN, was hypotensive on admission. Outpatient meds were held. Hypotension resolved with IV fluids. She briefly required neo-synephrine but has been weaned off. Discontinue IV fluids. 3. AKI. Nephrology has been consulted, input appreciated. Cr improving. Continue to monitor in the setting of IV lasix. IV fluids will be discontinued today. She has evidence of significant volume overload. Will continue IV lasix twice a day.  4. Anasarca. Likely related to aggressive hydration in the setting of hypoalbuminemia. Continue IV lasix. 5. Atrial fibrillation. Currently in NSR. CHADS score 2. Pt was on Xarelto prior to admission. Will resume on discharge once acute issues are resolved. 6. Leukocytosis. WBC count improving. Patient is afebrile. Possibly related to steroids. Continue prednisone at chronic dose of 15 mg daily. BC negative 2 days. 7. Adrenal insufficiency. Pt is chronically on prednisone, but since being in the hospital has been receiving stress dose of hydrocortisone. Since she has clinically improved has been transitioned back to baseline prednisone dosing. 8. Hypokalemia. Improving.  9. Hyponatremia. Sodium improving. Continue lasix 10. Hypocalcemia. When calcium is corrected for hypoalbumenemia, calcium is wnl. 11. Severe malnutrition in the context of acute illness/injury. Nutrition following. 12. Morbid obesity. Nutrition following   DVT prophylaxis: SQ heparin Code Status: Full Family Communication: Discussed  with patient and sister present at bedside.  Disposition Plan:  Discharge once improved    Consultants:   GI  Nutrition   General surgery  Nephrology  PT  Procedures:   None  Antimicrobials:   Vanc   7/23 >>  Flagyl 7/19 >> 7/30  Subjective: Persistent abd pain and diarrhea. Has had 4 episodes of diarrhea since evaluation yesterday.   Objective: Vitals:   11/09/15 1947 11/09/15 2139 11/10/15 0650 11/10/15 0734  BP:  (!) 127/96 (!) 134/92   Pulse:  (!) 104 (!) 106   Resp:  20 20   Temp:  98.2 F (36.8 C) 98.2 F (36.8 C)   TempSrc:  Oral Oral   SpO2: 95% 98% 98% 100%  Weight:      Height:        Intake/Output Summary (Last 24 hours) at 11/10/15 0757 Last data filed at 11/10/15 0500  Gross per 24 hour  Intake              480 ml  Output             2200 ml  Net            -1720 ml   Filed Weights   11/07/15 0500 11/08/15 0447 11/09/15 0625  Weight: 132.4 kg (291 lb 14.2 oz) 135 kg (297 lb 9.9 oz) 134.7 kg (296 lb 14.4 oz)   Examination:  General exam: Appears calm and comfortable  Respiratory system: diminished breath sounds. Respiratory effort normal. Cardiovascular system: S1 & S2 heard, RRR. No JVD, murmurs, rubs, gallops or clicks. 2+ edema. Gastrointestinal system: Abdomen is diffusely tender. No organomegaly or masses felt. Normal bowel sounds heard. Central nervous system: Alert and oriented. No focal neurological deficits. Extremities: Symmetric 5 x 5 power. Skin: No rashes, lesions or ulcers Psychiatry: Judgement and insight appear normal. Mood & affect appropriate.   Data Reviewed:  CBC:  Recent Labs Lab 11/06/15 0425 11/07/15 0410 11/08/15 0500 11/09/15 0700 11/10/15 0619  WBC 18.2* 19.1* 23.2* 19.2* 17.3*  HGB 11.2* 11.5* 12.9 13.0 12.1  HCT 32.7* 32.9* 37.7 38.1 35.2*  MCV 82.8 82.3 82.7 83.2 84.4  PLT 201 191 214 203 [id number]   Basic Metabolic Panel:  Recent Labs Lab 11/03/15 0806 11/04/15 0439  11/05/15 0440  11/06/15 0425 11/07/15 0410 11/08/15 0500 11/09/15 0700 11/10/15 0619    NA 131* 130*  < >  --   < > 129* 133* 133* 135  135 132*  K 4.1 4.4  < >  --   < > 3.7 2.8* 3.3* 3.5  3.6 3.4*  CL 104 102  < >  --   < > 101 101 102 103  103 103  CO2 19* 19*  < >  --   < > 21* 22 23 23  24 23   GLUCOSE 119* 131*  < >  --   < > 120* 113* 120* 118*  117* 104*  BUN 37* 38*  < >  --   < > 36* 32* 30* 29*  30* 27*  CREATININE 2.10* 1.95*  < >  --   < > 1.83* 1.60* 1.64* 1.68*  1.64* 1.57*  CALCIUM 6.8* 7.9*  < >  --   < > 7.5* 7.6* 7.7* 7.7*  7.7* 7.6*  MG 2.1 2.1  --  2.0  --  1.8  --   --   --   --   PHOS  --  4.2  < >  --   < >  4.2 3.7 3.3 3.1 3.3  < > = values in this interval not displayed. GFR: Estimated Creatinine Clearance: 52.4 mL/min (by C-G formula based on SCr of 1.57 mg/dL). Liver Function Tests:  Recent Labs Lab 11/04/15 0439  11/06/15 0425 11/07/15 0410 11/08/15 0500 11/09/15 0700 11/10/15 0619  AST 23  --   --   --   --  23  --   ALT 10*  --   --   --   --  12*  --   ALKPHOS 62  --   --   --   --  64  --   BILITOT 0.3  --   --   --   --  0.4  --   PROT 4.3*  --   --   --   --  4.3*  --   ALBUMIN 2.6*  < > 2.5* 2.5* 2.6* 2.4*  2.4* 2.4*  < > = values in this interval not displayed. Cardiac Enzymes:  Recent Labs Lab 11/03/15 0957  CKTOTAL 36*   Urine analysis:    Component Value Date/Time   COLORURINE BROWN (A) 11/02/2015 0525   APPEARANCEUR CLOUDY (A) 11/02/2015 0525   LABSPEC 1.025 11/02/2015 0525   PHURINE 5.0 11/02/2015 0525   GLUCOSEU 100 (A) 11/02/2015 0525   HGBUR LARGE (A) 11/02/2015 0525   BILIRUBINUR MODERATE (A) 11/02/2015 0525   KETONESUR 15 (A) 11/02/2015 0525   PROTEINUR 100 (A) 11/02/2015 0525   UROBILINOGEN 1.0 05/12/2014 1319   NITRITE POSITIVE (A) 11/02/2015 0525   LEUKOCYTESUR TRACE (A) 11/02/2015 0525   Sepsis Labs: @LABRCNTIP (procalcitonin:4,lacticidven:4)  ) Recent Results (from the past 240 hour(s))  Culture, Urine     Status: None   Collection Time: 11/02/15  4:02 PM  Result Value Ref Range  Status   Specimen Description URINE, CATHETERIZED  Final   Special Requests Immunocompromised  Final   Culture NO GROWTH Performed at [facility]   Final   Report Status 11/03/2015 FINAL  Final  MRSA PCR Screening     Status: None   Collection Time: 11/03/15 12:10 PM  Result Value Ref Range Status   MRSA by PCR NEGATIVE NEGATIVE Final    Comment:        The GeneXpert MRSA Assay (FDA approved for NASAL specimens only), is one component of a comprehensive MRSA colonization surveillance program. It is not intended to diagnose MRSA infection nor to guide or monitor treatment for MRSA infections.   Culture, blood (routine x 2)     Status: None (Preliminary result)   Collection Time: 11/08/15 10:30 AM  Result Value Ref Range Status   Specimen Description BLOOD  Final   Special Requests NONE  Final   Culture NO GROWTH 1 DAY  Final   Report Status PENDING  Incomplete  Culture, blood (routine x 2)     Status: None (Preliminary result)   Collection Time: 11/08/15 10:45 AM  Result Value Ref Range Status   Specimen Description BLOOD  Final   Special Requests NONE  Final   Culture NO GROWTH 1 DAY  Final   Report Status PENDING  Incomplete    Scheduled Meds: . budesonide  0.5 mg Nebulization BID  . citalopram  20 mg Oral QHS  . DULoxetine  30 mg Oral Daily  . feeding supplement  1 Container Oral TID BM  . feeding supplement (PRO-STAT SUGAR FREE 64)  30 mL Oral BID  . furosemide  120 mg Intravenous BID  . gabapentin  600 mg Oral TID  . heparin subcutaneous  5,000 Units Subcutaneous Q8H  . latanoprost  1 drop Both Eyes QHS  . loratadine  10 mg Oral Daily  . metroNIDAZOLE  500 mg Oral TID  . montelukast  10 mg Oral QHS  . nystatin  5 mL Oral QID  . potassium chloride  40 mEq Oral BID  . predniSONE  15 mg Oral Q breakfast  . promethazine  12.5 mg Oral TID AC & HS  . saccharomyces boulardii  250 mg Oral BID  . tiotropium  1 capsule Inhalation Daily  . vancomycin  500 mg  Oral Q6H   Continuous Infusions: . dextrose 5 % and 0.45% NaCl 1,000 mL with sodium bicarbonate 50 mEq infusion 75 mL/hr at 11/09/15 1728     LOS: 11 days    Time spent: 25 minutes    Merry Proud, MD [facility]  If 7PM-7AM, please contact night-coverage [email or web address] Password [email or web address] 11/10/2015, 7:57 AM   By signing my name below, I, Delene Ruffini, attest that this documentation has been prepared under the direction and in the presence of Kathie Dike, MD. Electronically Signed: Delene Ruffini 11/10/15 2:00pm  I, Dr. Kathie Dike, personally performed the services described in this documentaiton. All medical record entries made by the scribe were at my direction and in my presence. I have reviewed the chart and agree that the record reflects my personal performance and is accurate and complete  Kathie Dike, MD, 11/10/2015 2:14 PM

## 2015-11-10 NOTE — Progress Notes (Signed)
Subjective: Interval History: Patient complains of weakness and continued to have diarrhea. She didn't have any nausea or vomiting. Her appetite remains poor  Objective: Vital signs in isn't requiring me I try always a st 24 hours: Temp:  [98.2 F (36.8 C)-98.4 F (36.9 C)] 98.2 F (36.8 C) (07/30 0650) Pulse Rate:  [84-106] 106 (07/30 0650) Resp:  [20] 20 (07/30 0650) BP: (112-134)/(86-96) 134/92 (07/30 0650) SpO2:  [95 %-100 %] 100 % (07/30 0734) Weight change:   Intake/Output from previous day: 07/29 0701 - 07/30 0700 In: 480 [P.O.:480] Out: 2200 [Urine:2200] Intake/Output this shift: No intake/output data recorded.  General appearance: alert, cooperative and no distress Resp: clear to auscultation bilaterally Cardio: regular rate and rhythm, S1, S2 normal, no murmur, click, rub or gallop GI: Patient with some abdominal tenderness but no rebound tenderness. Positive bowel sounds Extremities: edema 1-2+ edema  Lab Results:  Recent Labs  11/09/15 0700 11/10/15 0619  WBC 19.2* 17.3*  HGB 13.0 12.1  HCT 38.1 35.2*  PLT 203 229   BMET:   Recent Labs  11/09/15 0700 11/10/15 0619  NA 135  135 132*  K 3.5  3.6 3.4*  CL 103  103 103  CO2 23  24 23   GLUCOSE 118*  117* 104*  BUN 29*  30* 27*  CREATININE 1.68*  1.64* 1.57*  CALCIUM 7.7*  7.7* 7.6*   No results for input(s): PTH in the last 72 hours. Iron Studies: No results for input(s): IRON, TIBC, TRANSFERRIN, FERRITIN in the last 72 hours.  Studies/Results: No results found.  I have reviewed the patient's current medications.  Assessment/Plan: Problem #1 acute kidney injury: Her renal function Showing progressive and slight improvement. Problem #2 . Pancolitis: Still patient continue with diarrhea.  Problem #3 history of asthma: She is on inhalers. Patient does not have any complaints.  Problem #4 low CO2: Respiratory alkalosis. has corrected. Problem #5 hypophosphatemia: Her phosphorus is  normal Problem #6 hypokalemia: . Potassium is has declined today. Problem #7 hypocalcemia on calcium supplement. Her calcium is normal. Problem #8 anasarca: patient on Lasix she had 2200 mL of urine output which seems to be better. However patient still with significant sign of fluid overload.. Problem #9 hypotension: Her blood pressure is much better. Plan: 1] we'll continue continue his Lasix 2] Will DC IV fluid  3] we'll check compressive metabolic panel in the morning.   LOS: 11 days   Reesha Debes S 11/10/2015,10:04 AM

## 2015-11-10 NOTE — Progress Notes (Signed)
    Assessment/Plan: ADMITTED WITH SEVERE C DIFF COLITIS. CLINICALLY IMPROVED. C/O NAUSEA AND PHENERGAN HELPS BUT DOES NOT RELIEVE SYMPTOMS. DAY 11 OF FLAGYL. UA NEG. CONTINUES ON VANC PO AND FLORASTOR. PREDNISONE DOWN TO 15 MG DAILY. PEPCID PRN.  PLAN: 1. D/C FLAGYL. PEPCID PRN. MONITOR STOOLS AND NAUSEA. 2. CONTINUE VANC AND FLORASTOR. 3. PHENERGAN QAC/HS. ZOFRAN PRN. PT AWARE TO ASK FOR IT IF NEEDED.     Subjective: Since I last evaluated the patient NAUSEA PERSISTS. ABDOMINAL PAIN SLIGHTLY IMPROVED. STOOL MUSHY BUT LESS BMs TODAY. BARI BED FEELS BETTER. FOLEY STILL INPLACE.  Objective: Vital signs in last 24 hours: Vitals:   11/09/15 2139 11/10/15 0650  BP: (!) 127/96 (!) 134/92  Pulse: (!) 104 (!) 106  Resp: 20 20  Temp: 98.2 F (36.8 C) 98.2 F (36.8 C)     General appearance: alert, cooperative and no distress Resp: clear to auscultation bilaterally Cardio: regular rate and rhythm GI: soft, non-tender LUQ/LLQ; MILD RUQ/RLQ TTP, bowel sounds normal; NO REBOUND OR GUARDING Extremities: edema 3-4+ BILATERAL LOWER EXTERMITIES  NEURO: NO NEW FOCAL DEFICITS  Lab Results:  UA: WBC 0-5, RBC 6-30  WBC 17.2 Hb 12.1 Cr 1.57 K 3.4   Studies/Results: No results found.  Medications: I have reviewed the patient's current medications.   LOS: 5 days   Barney Drain 09/21/2013, 2:23 PM

## 2015-11-11 LAB — CBC
HCT: 35.5 % — ABNORMAL LOW (ref 36.0–46.0)
Hemoglobin: 12 g/dL (ref 12.0–15.0)
MCH: 28.6 pg (ref 26.0–34.0)
MCHC: 33.8 g/dL (ref 30.0–36.0)
MCV: 84.7 fL (ref 78.0–100.0)
PLATELETS: 278 10*3/uL (ref 150–400)
RBC: 4.19 MIL/uL (ref 3.87–5.11)
RDW: 17 % — AB (ref 11.5–15.5)
WBC: 15.8 10*3/uL — ABNORMAL HIGH (ref 4.0–10.5)

## 2015-11-11 LAB — RENAL FUNCTION PANEL
Albumin: 2.5 g/dL — ABNORMAL LOW (ref 3.5–5.0)
Anion gap: 10 (ref 5–15)
BUN: 29 mg/dL — ABNORMAL HIGH (ref 6–20)
CALCIUM: 8.2 mg/dL — AB (ref 8.9–10.3)
CO2: 24 mmol/L (ref 22–32)
CREATININE: 1.66 mg/dL — AB (ref 0.44–1.00)
Chloride: 101 mmol/L (ref 101–111)
GFR calc Af Amer: 39 mL/min — ABNORMAL LOW (ref >60)
GFR calc non Af Amer: 33 mL/min — ABNORMAL LOW (ref >60)
GLUCOSE: 92 mg/dL (ref 65–99)
Phosphorus: 3.6 mg/dL (ref 2.5–4.6)
Potassium: 3.9 mmol/L (ref 3.5–5.1)
SODIUM: 135 mmol/L (ref 135–145)

## 2015-11-11 MED ORDER — FUROSEMIDE 10 MG/ML IJ SOLN
INTRAMUSCULAR | Status: AC
  Filled 2015-11-11: qty 20

## 2015-11-11 MED ORDER — SODIUM CHLORIDE 0.9% FLUSH
10.0000 mL | Freq: Two times a day (BID) | INTRAVENOUS | Status: DC
  Administered 2015-11-11: 10 mL
  Administered 2015-11-11: 20 mL
  Administered 2015-11-12: 10 mL
  Administered 2015-11-12: 30 mL
  Administered 2015-11-12: 20 mL
  Administered 2015-11-13 – 2015-11-14 (×3): 10 mL
  Administered 2015-11-15 (×2): 20 mL
  Administered 2015-11-16 – 2015-11-17 (×3): 10 mL
  Administered 2015-11-18: 20 mL
  Administered 2015-11-18 – 2015-11-19 (×2): 10 mL

## 2015-11-11 MED ORDER — SODIUM CHLORIDE 0.9% FLUSH
10.0000 mL | INTRAVENOUS | Status: DC | PRN
  Administered 2015-11-11: 10 mL
  Filled 2015-11-11: qty 40

## 2015-11-11 NOTE — Progress Notes (Signed)
    Subjective: 2 soft stools yesterday, frequency and consistency improved. Nausea improved. Abdominal pain persists but improved. Tolerating diet. Avoiding milk products.   Objective: Vital signs in last 24 hours: Temp:  [97.9 F (36.6 C)-98 F (36.7 C)] 97.9 F (36.6 C) (07/31 [id number]) Pulse Rate:  [88-106] 106 (07/31 0613) Resp:  [20] 20 (07/31 0613) BP: (126-149)/(60-105) 126/88 (07/31 0613) SpO2:  [95 %-100 %] 99 % (07/31 0613) Weight:  [297 lb 9.9 oz (135 kg)-298 lb (135.2 kg)] 297 lb 9.9 oz (135 kg) (07/31 [id number]) Last BM Date: 11/10/15 General:   Alert and oriented, pleasant Head:  Normocephalic and atraumatic.  Abdomen:  Bowel sounds present, soft, obese, mild TTP upper abdomen, no rebound or guarding   Extremities:  Generalized anasarca, 3+ pitting lower extremity edema, pedal edema  Neurologic:  Alert and  oriented x4 Psych:  Alert and cooperative.   Intake/Output from previous day: 07/30 0701 - 07/31 0700 In: 480 [P.O.:480] Out: 1500 [Urine:1500] Intake/Output this shift: No intake/output data recorded.  Lab Results:  Recent Labs  11/09/15 0700 11/10/15 0619 11/11/15 0631  WBC 19.2* 17.3* 15.8*  HGB 13.0 12.1 12.0  HCT 38.1 35.2* 35.5*  PLT 203 229 278   BMET  Recent Labs  11/09/15 0700 11/10/15 0619 11/11/15 0631  NA 135  135 132* 135  K 3.5  3.6 3.4* 3.9  CL 103  103 103 101  CO2 23  24 23 24   GLUCOSE 118*  117* 104* 92  BUN 29*  30* 27* 29*  CREATININE 1.68*  1.64* 1.57* 1.66*  CALCIUM 7.7*  7.7* 7.6* 8.2*   LFT  Recent Labs  11/09/15 0700 11/10/15 0619 11/11/15 0631  PROT 4.3*  --   --   ALBUMIN 2.4*  2.4* 2.4* 2.5*  AST 23  --   --   ALT 12*  --   --   ALKPHOS 64  --   --   BILITOT 0.4  --   --    PT/INR No results for input(s): LABPROT, INR in the last 72 hours.  Assessment: 55 year old female admitted with severe/complicated Cdiff colitis slowly responding to therapy. Clinically improving. Leukocytosis greatly  improved from admission, with persisting mild leukocytosis likely multifactorial in setting of steroids. Off Flagyl now, will need to continue Vanc and follow taper as outlined on progress not dated 7/26.   Anasarca: in setting of hypoalbuminemia, acute illness.   Adrenal insufficiency: now on oral steroids.  Acute kidney injury: appreciate nephrology.   Nausea: without vomiting. Tolerating diet. Continue Phenergan 12.5 mg TID before meals and bedtime. Zofran without improvement previously. Would limit Pepcid to only prn as already doing due to mixed literature regarding H2 blocker/PPI therapy and Cdiff.   Plan: ~ Vanc 500 mg TID through August 3, then Vanc taper to begin on August 4th of 500mg  TID for 5 days, 500mg  BID for 5 days, 500mg  daily for 5 days ~Continue probiotic ~ Continue scheduled Phenergan ~ Continue Zofran prn ~ Continue Pepcid only prn ~Continue Nystatin  ~ Soft diet  ~Monitor stool output ~Supportive measures   Orvil Feil, ANP-BC [facility]    LOS: 12 days    11/11/2015, 7:54 AM

## 2015-11-11 NOTE — Progress Notes (Addendum)
PROGRESS NOTE    Deborah Murray  [id number] DOB: 1960-07-12 DOA: 10/30/2015 PCP: Sol Passer, MD  Outpatient Specialists:   Gastroenterology: Manus Rudd, MD   Brief Narrative:  73 yof with hx of A-fib, HTN, severe asthma/allergies, adrenal insufficiency, and recent back surgery, presented with complaints of abd pain and diarrhea that onset 2 weeks ago from her back surgery. She reported having loose stool once she arrived at home. Also, she reported having a fever and feeling very weak at home. While in the ED, temperature 102.3, BP normal, and WBC 23.7. CT abd/pelvis showed diffuse edematous wall thickening in the colon, from the cecal tip to the rectum associated with diffuse pericolonic edema/ inflammation which is compatible with pancolitis. C. Diff toxin positive. GI pathogen panel negative. Follow up CT abd/pelvis showed worsening pancolitis. Surgery and GI were consulted. Nephrology was consulted for her acute renal failure. PT consulted, recommended HH with PT. Additionally, due to increasing WBC count, repeat blood cultures were ordered to rule out any infection, but have remained negative. Will continue IV lasix twice a day.She will need several more days of treatment.   Assessment & Plan:   Principal Problem:   Pancolitis ([place]) Active Problems:   Hypokalemia   Essential hypertension   Severe persistent asthma   Morbid obesity ([facility])   C. difficile diarrhea   Hypotension   History of atrial fibrillation- NSR now   History of adrenal insufficiency   History of hypertension   C. difficile colitis   Metabolic acidosis   Hypocalcemia   Acute kidney injury ([place])   Hyponatremia   Sepsis (HCC)   AKI (acute kidney injury) ([place])   Pyrexia   Protein-calorie malnutrition, severe  1. Pancolitis secondary to C. Diff colitis. C. Diff PCR positive. GI pathogen negative. General surgery consult appreciated. It was not felt that surgical intervention was necessary at this  time. GI consult appreciated. Tolerating solid diet..PT has evaluated the patient and recommends HH PT and 24 supervision. Blood cultures negative 2 days. Continue oral Vanc. Discontinue flagyl. Continue florastor. Overall feels she is improving. Will continue anti-emetics. Abd xray form 7/27 was unremarkable.  2. Essential HTN, was hypotensive on admission. Outpatient meds were held. Hypotension resolved with IV fluids. She briefly required neo-synephrine but has been weaned off.  3. AKI. Nephrology has been consulted, input appreciated. Cr improving. Continue to monitor in the setting of IV lasix. IV fluids discontinued. She has evidence of significant volume overload. Continue IV lasix twice a day.  4. Anasarca. Likely related to aggressive hydration in the setting of hypoalbuminemia. Continue IV lasix. 5. Atrial fibrillation. Currently in NSR. CHADS score 2. Pt was on Xarelto prior to admission. Will resume on discharge once acute issues are resolved. 6. Leukocytosis. WBC count improving. Patient is afebrile. Possibly related to steroids. Continue prednisone at chronic dose of 15 mg daily. BC negative 2 days. 7. Adrenal insufficiency. Pt is chronically on prednisone, but since being in the hospital has been receiving stress dose of hydrocortisone. Since she has clinically improved has been transitioned back to baseline prednisone dosing. 8. Hypokalemia. Resolved.   9. Hyponatremia. Resolved in the setting of lasix. Continue lasix.  10. Hypocalcemia. When calcium is corrected for hypoalbumenemia, calcium is wnl.  11. Severe malnutrition in the context of acute illness/injury. Nutrition following. 12. Morbid obesity. Nutrition following   DVT prophylaxis: Heparin  Code Status: Full Family Communication: Family bedside  Disposition Plan: Discharge once improved    Consultants:  GI  Nutrition   General surgery  Nephrology  PT  Procedures:   None   Antimicrobials:   Vanc   7/23  >>  Flagyl 7/19 >> 7/30   Subjective: Feels better today. Diarrhea, nausea, and breathing has improved. BLE swelling has not improved.   Objective: Vitals:   11/10/15 2141 11/11/15 0613 11/11/15 0817 11/11/15 0819  BP: (!) 149/105 126/88    Pulse: 97 (!) 106    Resp: 20 20    Temp: 98 F (36.7 C) 97.9 F (36.6 C)    TempSrc: Oral Oral    SpO2: 100% 99% 98% 100%  Weight:  135 kg (297 lb 9.9 oz)    Height:        Intake/Output Summary (Last 24 hours) at 11/11/15 0840 Last data filed at 11/11/15 [id number]  Gross per 24 hour  Intake              360 ml  Output             1500 ml  Net            -1140 ml   Filed Weights   11/09/15 0625 11/10/15 1449 11/11/15 0613  Weight: 134.7 kg (296 lb 14.4 oz) 135.2 kg (298 lb) 135 kg (297 lb 9.9 oz)    Examination:  General exam: Appears calm and comfortable  Respiratory system: Clear to auscultation. Respiratory effort normal.  Cardiovascular system:  No JVD, murmurs, rubs, gallops or clicks.  Mildly tachycardic.  Gastrointestinal system: Abdomen is nondistended, soft and nontender. No organomegaly or masses felt. Normal bowel sounds heard. Central nervous system: Alert and oriented. No focal neurological deficits. Extremities: 2+ bilateral edema in LE  Skin: No rashes, lesions or ulcers Psychiatry: Judgement and insight appear normal. Mood & affect appropriate.     Data Reviewed: I have personally reviewed following labs and imaging studies  CBC:  Recent Labs Lab 11/07/15 0410 11/08/15 0500 11/09/15 0700 11/10/15 0619 11/11/15 0631  WBC 19.1* 23.2* 19.2* 17.3* 15.8*  HGB 11.5* 12.9 13.0 12.1 12.0  HCT 32.9* 37.7 38.1 35.2* 35.5*  MCV 82.3 82.7 83.2 84.4 84.7  PLT 191 214 203 229 0000000   Basic Metabolic Panel:  Recent Labs Lab 11/05/15 0440  11/06/15 0425 11/07/15 0410 11/08/15 0500 11/09/15 0700 11/10/15 0619 11/11/15 0631  NA  --   < > 129* 133* 133* 135  135 132* 135  K  --   < > 3.7 2.8* 3.3* 3.5  3.6 3.4*  3.9  CL  --   < > 101 101 102 103  103 103 101  CO2  --   < > 21* 22 23 23  24 23 24   GLUCOSE  --   < > 120* 113* 120* 118*  117* 104* 92  BUN  --   < > 36* 32* 30* 29*  30* 27* 29*  CREATININE  --   < > 1.83* 1.60* 1.64* 1.68*  1.64* 1.57* 1.66*  CALCIUM  --   < > 7.5* 7.6* 7.7* 7.7*  7.7* 7.6* 8.2*  MG 2.0  --  1.8  --   --   --   --   --   PHOS  --   < > 4.2 3.7 3.3 3.1 3.3 3.6  < > = values in this interval not displayed. GFR: Estimated Creatinine Clearance: 49.6 mL/min (by C-G formula based on SCr of 1.66 mg/dL). Liver Function Tests:  Recent Labs Lab 11/07/15 0410 11/08/15 0500  11/09/15 0700 11/10/15 0619 11/11/15 0631  AST  --   --  23  --   --   ALT  --   --  12*  --   --   ALKPHOS  --   --  64  --   --   BILITOT  --   --  0.4  --   --   PROT  --   --  4.3*  --   --   ALBUMIN 2.5* 2.6* 2.4*  2.4* 2.4* 2.5*   No results for input(s): LIPASE, AMYLASE in the last 168 hours. No results for input(s): AMMONIA in the last 168 hours. Coagulation Profile: No results for input(s): INR, PROTIME in the last 168 hours. Cardiac Enzymes: No results for input(s): CKTOTAL, CKMB, CKMBINDEX, TROPONINI in the last 168 hours. BNP (last 3 results) No results for input(s): PROBNP in the last 8760 hours. HbA1C: No results for input(s): HGBA1C in the last 72 hours. CBG:  Recent Labs Lab 11/10/15 0809  GLUCAP 107*   Lipid Profile: No results for input(s): CHOL, HDL, LDLCALC, TRIG, CHOLHDL, LDLDIRECT in the last 72 hours. Thyroid Function Tests: No results for input(s): TSH, T4TOTAL, FREET4, T3FREE, THYROIDAB in the last 72 hours. Anemia Panel: No results for input(s): VITAMINB12, FOLATE, FERRITIN, TIBC, IRON, RETICCTPCT in the last 72 hours. Urine analysis:    Component Value Date/Time   COLORURINE AMBER (A) 11/10/2015 1000   APPEARANCEUR HAZY (A) 11/10/2015 1000   LABSPEC 1.020 11/10/2015 1000   PHURINE 5.5 11/10/2015 1000   GLUCOSEU NEGATIVE 11/10/2015 1000   HGBUR  MODERATE (A) 11/10/2015 1000   BILIRUBINUR NEGATIVE 11/10/2015 1000   KETONESUR NEGATIVE 11/10/2015 1000   PROTEINUR TRACE (A) 11/10/2015 1000   UROBILINOGEN 1.0 05/12/2014 1319   NITRITE NEGATIVE 11/10/2015 1000   LEUKOCYTESUR NEGATIVE 11/10/2015 1000   Sepsis Labs: @LABRCNTIP (procalcitonin:4,lacticidven:4)  ) Recent Results (from the past 240 hour(s))  Culture, Urine     Status: None   Collection Time: 11/02/15  4:02 PM  Result Value Ref Range Status   Specimen Description URINE, CATHETERIZED  Final   Special Requests Immunocompromised  Final   Culture NO GROWTH Performed at [facility]   Final   Report Status 11/03/2015 FINAL  Final  MRSA PCR Screening     Status: None   Collection Time: 11/03/15 12:10 PM  Result Value Ref Range Status   MRSA by PCR NEGATIVE NEGATIVE Final    Comment:        The GeneXpert MRSA Assay (FDA approved for NASAL specimens only), is one component of a comprehensive MRSA colonization surveillance program. It is not intended to diagnose MRSA infection nor to guide or monitor treatment for MRSA infections.   Culture, blood (routine x 2)     Status: None (Preliminary result)   Collection Time: 11/08/15 10:30 AM  Result Value Ref Range Status   Specimen Description RIGHT ANTECUBITAL  Final   Special Requests BOTTLES DRAWN AEROBIC AND ANAEROBIC 4CC EACH  Final   Culture NO GROWTH 2 DAYS  Final   Report Status PENDING  Incomplete  Culture, blood (routine x 2)     Status: None (Preliminary result)   Collection Time: 11/08/15 10:45 AM  Result Value Ref Range Status   Specimen Description BLOOD PICC LINE DRAW  Final   Special Requests BOTTLES DRAWN AEROBIC AND ANAEROBIC 6CC EACH  Final   Culture NO GROWTH 2 DAYS  Final   Report Status PENDING  Incomplete  Radiology Studies: No results found.      Scheduled Meds: . budesonide  0.5 mg Nebulization BID  . citalopram  20 mg Oral QHS  . DULoxetine  30 mg Oral Daily  .  feeding supplement  1 Container Oral TID BM  . feeding supplement (PRO-STAT SUGAR FREE 64)  30 mL Oral BID  . furosemide  120 mg Intravenous BID  . gabapentin  600 mg Oral TID  . heparin subcutaneous  5,000 Units Subcutaneous Q8H  . latanoprost  1 drop Both Eyes QHS  . loratadine  10 mg Oral Daily  . montelukast  10 mg Oral QHS  . nystatin  5 mL Oral QID  . potassium chloride  40 mEq Oral BID  . predniSONE  15 mg Oral Q breakfast  . promethazine  12.5 mg Oral TID AC & HS  . saccharomyces boulardii  250 mg Oral BID  . tiotropium  1 capsule Inhalation Daily  . vancomycin  500 mg Oral Q6H   Continuous Infusions:    LOS: 12 days    Time spent: 20 minutes     Kathie Dike, MD [facility] If 7PM-7AM, please contact night-coverage [email or web address] Password [email or web address] 11/11/2015, 8:40 AM   By signing my name below, I, Collene Leyden, attest that this documentation has been prepared under the direction and in the presence of Kathie Dike, MD. Electronically signed: Collene Leyden, Scribe. 11/11/15 12:16PM  I, Dr. Kathie Dike, personally performed the services described in this documentaiton. All medical record entries made by the scribe were at my direction and in my presence. I have reviewed the chart and agree that the record reflects my personal performance and is accurate and complete  Kathie Dike, MD, 11/11/2015 12:23 PM

## 2015-11-11 NOTE — Progress Notes (Signed)
Subjective: Interval History: Patient continued to complain about diarrhea. She has also some abdominal pain. Presently she is able to eat. She denies any nausea or vomiting.  Objective: Vital signs in isn't requiring me I try always a st 24 hours: Temp:  [97.9 F (36.6 C)-98 F (36.7 C)] 97.9 F (36.6 C) (07/31 [id number]) Pulse Rate:  [88-106] 106 (07/31 0613) Resp:  [20] 20 (07/31 0613) BP: (126-149)/(60-105) 126/88 (07/31 0613) SpO2:  [95 %-100 %] 99 % (07/31 0613) Weight:  [135 kg (297 lb 9.9 oz)-135.2 kg (298 lb)] 135 kg (297 lb 9.9 oz) (07/31 [id number]) Weight change:   Intake/Output from previous day: 07/30 0701 - 07/31 0700 In: 480 [P.O.:480] Out: 1500 [Urine:1500] Intake/Output this shift: No intake/output data recorded.  General appearance: alert, cooperative and no distress Resp: clear to auscultation bilaterally Cardio: regular rate and rhythm, S1, S2 normal, no murmur, click, rub or gallop GI: Patient with some abdominal tenderness but no rebound tenderness. Positive bowel sounds Extremities: edema 1-2+ edema  Lab Results:  Recent Labs  11/10/15 0619 11/11/15 0631  WBC 17.3* 15.8*  HGB 12.1 12.0  HCT 35.2* 35.5*  PLT 229 278   BMET:   Recent Labs  11/10/15 0619 11/11/15 0631  NA 132* 135  K 3.4* 3.9  CL 103 101  CO2 23 24  GLUCOSE 104* 92  BUN 27* 29*  CREATININE 1.57* 1.66*  CALCIUM 7.6* 8.2*   No results for input(s): PTH in the last 72 hours. Iron Studies: No results for input(s): IRON, TIBC, TRANSFERRIN, FERRITIN in the last 72 hours.  Studies/Results: No results found.  I have reviewed the patient's current medications.  Assessment/Plan: Problem #1 acute kidney injury: Her Creatinine is slightly high. Her IV fluids was discontinued yesterday. Presently she is on diuretics.. Problem #2 . Pancolitis: Still patient continue with diarrhea. Patient is being followed by GI. Problem #3 history of asthma: She is on inhalers. Patient does not have any  complaints.  Problem #4 low CO2: Respiratory alkalosis. Her CO2 is stable. Problem #5 hypophosphatemia: Her phosphorus is normal Problem #6 hypokalemia: . Potassium has corrected. Problem #7 hypocalcemia on calcium supplement. Her calcium is normal. Problem #8 anasarca: Patient presently on Lasix.. Problem #9 hypotension: Her blood pressure is in normal range Plan: 1] we'll continue continue his Lasix 2] we'll check her renal panel in the morning.   LOS: 12 days   Zyon Rosser S 11/11/2015,8:02 AM

## 2015-11-11 NOTE — Care Management Important Message (Signed)
Important Message  Patient Details  Name: Deborah Murray MRN: [id number] Date of Birth: 22-Jul-1960   Medicare Important Message Given:  Yes    Sherald Barge, RN 11/11/2015, 2:38 PM

## 2015-11-11 NOTE — Progress Notes (Signed)
Physical Therapy Treatment Patient Details Name: Deborah Murray MRN: [id number] DOB: 1961-02-14 Today's Date: 11/11/2015    History of Present Illness 55 yo F admitted 10/30/2015 due to persistent abdominal pain and diarrhea.  She underwent back surgery 2 weeks ago at [facility].  Dx: pancolitis due to C-diff.  PMH: obesity, DJD, severe asthma, allergies, HTN, chronic back pain, Afib, and recent back surgery, syncope and collapse, thyroid disease, vocal cord dysfunction, lumbar radiculopathy, adrenal abnormality, COPD, gastric bypass, cholecystectomy, R knee surgery.    PT Comments    Pt received in bed, sister present, but left for PT tx.  Pt was agreeable for PT tx.  Pt was able to complete LE bed level exercises, but required assistance.  Pt also required min/mod A for supine<>sit, and then became very dizzy and nauseated when sitting on the EOB.  Pt found to be orthostatic with vitals as follows:  Supine: 126/88, HR: 98bpm Sitting: 104/54, HR: 64bpm Return to Supine: 120/98, HR: 103bpm Pt encouraged to keep LE's elevated, and to mobilize LE's as much as possible.  At this point, I have changed my recommendations to SNF due to no further progress at this time.  Pt would benefit from continued strengthening, balance, and functional mobility training.    Follow Up Recommendations  SNF     Equipment Recommendations  None recommended by PT    Recommendations for Other Services       Precautions / Restrictions Precautions Precautions: Back;Other (comment) (Recent back surgery) Precaution Booklet Issued: No Precaution Comments: Recent spinal fusion 2 weeks prior to admission.  Restrictions Weight Bearing Restrictions: No    Mobility  Bed Mobility Overal bed mobility: Needs Assistance Bed Mobility: Supine to Sit;Sit to Sidelying     Supine to sit: Mod assist;Min assist;HOB elevated Sit to supine: +2 for physical assistance;Max assist   General bed mobility comments: Pt now on  bariatric bed with air mattress, and she was able to transfer supine<>sit, but c/o dizziness and nausea with this transfer.  BP obtained, and pt demonstrated significant hypotension, therefore, assisted pt back into supine.   Transfers                    Ambulation/Gait                 Stairs            Wheelchair Mobility    Modified Rankin (Stroke Patients Only)       Balance   Sitting-balance support: Bilateral upper extremity supported Sitting balance-Leahy Scale: Fair                              Cognition Arousal/Alertness: Awake/alert Behavior During Therapy: WFL for tasks assessed/performed Overall Cognitive Status: Within Functional Limits for tasks assessed                      Exercises General Exercises - Lower Extremity Ankle Circles/Pumps: AROM;Both;20 reps;Supine Heel Slides: AAROM;Both;20 reps;Supine Hip ABduction/ADduction: AROM;Both;20 reps;Supine    General Comments General comments (skin integrity, edema, etc.): B LE +3 pitting edema      Pertinent Vitals/Pain Pain Assessment:  (Pt had no c/o pain during tx, but requested pain medication at the end of tx. )    Home Living                      Prior Function  PT Goals (current goals can now be found in the care plan section) Acute Rehab PT Goals Patient Stated Goal: To go home PT Goal Formulation: With patient/family Time For Goal Achievement: 11/15/15 Potential to Achieve Goals: Good Progress towards PT goals: Not progressing toward goals - comment (orthostatic hyptension. )    Frequency  Min 5X/week    PT Plan Discharge plan needs to be updated    Co-evaluation             End of Session   Activity Tolerance: Treatment limited secondary to medical complications (Comment) Patient left: in bed;with call bell/phone within reach     Time: 1255-1340 PT Time Calculation (min) (ACUTE ONLY): 45 min  Charges:   $Therapeutic Exercise: 8-22 mins $Therapeutic Activity: 23-37 mins                    G Codes:      Beth Lev Cervone, PT, DPT X: [phone]

## 2015-11-12 LAB — RENAL FUNCTION PANEL
Albumin: 2.5 g/dL — ABNORMAL LOW (ref 3.5–5.0)
Anion gap: 7 (ref 5–15)
BUN: 30 mg/dL — ABNORMAL HIGH (ref 6–20)
CALCIUM: 8.2 mg/dL — AB (ref 8.9–10.3)
CO2: 25 mmol/L (ref 22–32)
CREATININE: 1.7 mg/dL — AB (ref 0.44–1.00)
Chloride: 103 mmol/L (ref 101–111)
GFR calc Af Amer: 37 mL/min — ABNORMAL LOW (ref >60)
GFR calc non Af Amer: 32 mL/min — ABNORMAL LOW (ref >60)
GLUCOSE: 87 mg/dL (ref 65–99)
Phosphorus: 4 mg/dL (ref 2.5–4.6)
Potassium: 3.9 mmol/L (ref 3.5–5.1)
SODIUM: 135 mmol/L (ref 135–145)

## 2015-11-12 LAB — CBC
HCT: 35.4 % — ABNORMAL LOW (ref 36.0–46.0)
HEMOGLOBIN: 12 g/dL (ref 12.0–15.0)
MCH: 28.7 pg (ref 26.0–34.0)
MCHC: 33.9 g/dL (ref 30.0–36.0)
MCV: 84.7 fL (ref 78.0–100.0)
PLATELETS: 318 10*3/uL (ref 150–400)
RBC: 4.18 MIL/uL (ref 3.87–5.11)
RDW: 17 % — ABNORMAL HIGH (ref 11.5–15.5)
WBC: 15 10*3/uL — ABNORMAL HIGH (ref 4.0–10.5)

## 2015-11-12 LAB — HEPATIC FUNCTION PANEL
ALK PHOS: 51 U/L (ref 38–126)
ALT: 9 U/L — ABNORMAL LOW (ref 14–54)
AST: 20 U/L (ref 15–41)
Albumin: 2.5 g/dL — ABNORMAL LOW (ref 3.5–5.0)
BILIRUBIN INDIRECT: 0.5 mg/dL (ref 0.3–0.9)
Bilirubin, Direct: 0.1 mg/dL (ref 0.1–0.5)
TOTAL PROTEIN: 4.6 g/dL — AB (ref 6.5–8.1)
Total Bilirubin: 0.6 mg/dL (ref 0.3–1.2)

## 2015-11-12 MED ORDER — FUROSEMIDE 10 MG/ML IJ SOLN
200.0000 mg | Freq: Two times a day (BID) | INTRAVENOUS | Status: DC
  Administered 2015-11-12 – 2015-11-17 (×11): 200 mg via INTRAVENOUS
  Filled 2015-11-12 (×13): qty 20

## 2015-11-12 MED ORDER — METOLAZONE 5 MG PO TABS
5.0000 mg | ORAL_TABLET | Freq: Two times a day (BID) | ORAL | Status: DC
  Administered 2015-11-12 – 2015-11-19 (×15): 5 mg via ORAL
  Filled 2015-11-12 (×15): qty 1

## 2015-11-12 MED ORDER — RIVAROXABAN 20 MG PO TABS
20.0000 mg | ORAL_TABLET | Freq: Every day | ORAL | Status: DC
  Administered 2015-11-12 – 2015-11-18 (×7): 20 mg via ORAL
  Filled 2015-11-12 (×9): qty 1

## 2015-11-12 MED ORDER — DEXTROSE 5 % IV SOLN: 200.0000 mg | Freq: Two times a day (BID) | INTRAVENOUS | Status: DC

## 2015-11-12 NOTE — Progress Notes (Signed)
PROGRESS NOTE    Deborah Murray  [id number] DOB: Mar 18, 1961 DOA: 10/30/2015 PCP: Sol Passer, MD  Outpatient Specialists:   Gastroenterology: Manus Rudd, MD   Brief Narrative:  39 yof with hx of A-fib, HTN, severe asthma/allergies, adrenal insufficiency, and recent back surgery, presented with complaints of abd pain and diarrhea that onset 2 weeks ago from her back surgery. She reported having loose stool once she arrived at home from the hospital. Also, she reported having a fever and feeling very weak at home. While in the ED, temperature 102.3, BP normal, and WBC 23.7. CT abd/pelvis showed diffuse edematous wall thickening in the colon, from the cecal tip to the rectum associated with diffuse pericolonic edema/ inflammation which is compatible with pancolitis. C. Diff toxin positive. GI pathogen panel negative. Follow up CT abd/pelvis showed worsening pancolitis. Surgery and GI were consulted. Nephrology was consulted for her acute renal failure. She has been slow to progress. She is on oral vanc for C diff and nephrology is adjusting diuretics for renal failure.   Assessment & Plan:   Principal Problem:   Pancolitis ([place]) Active Problems:   Hypokalemia   Essential hypertension   Severe persistent asthma   Morbid obesity (HCC)   C. difficile diarrhea   Abdominal pain in female   Hypotension   History of atrial fibrillation- NSR now   History of adrenal insufficiency   History of hypertension   C. difficile colitis   Metabolic acidosis   Hypocalcemia   Acute kidney injury ([place])   Hyponatremia   Sepsis (HCC)   AKI (acute kidney injury) ([place])   Pyrexia   Protein-calorie malnutrition, severe  1. Pancolitis secondary to C. Diff colitis. C. Diff PCR positive. GI pathogen negative. General surgery consult appreciated. It was not felt that surgical intervention was necessary. GI consult appreciated.  PT has evaluated the patient and recommends HH PT and 24 supervision.  Blood cultures negative. Continue oral Vanc. Continue florastor. Tolerating solid diet. Overall feels she is improving. Will continue anti-emetics. 2. Essential HTN, was hypotensive on admission. Outpatient meds were held. Hypotension resolved with IV fluids. She briefly required neo-synephrine but has been weaned off.  3. AKI. Nephrology has been consulted, input appreciated. IV fluids discontinued since she has evidence of significant volume overload. Nephrology is adjusting lasix. Continue to monitor in the setting of IV lasix BID. 4. Anasarca. Likely related to aggressive hydration in the setting of hypoalbuminemia. Continue IV lasix. 5. Atrial fibrillation. Currently in NSR. CHADS score 2. Anticoagulated with xarelto. 6. Leukocytosis. WBC count improving. Patient is afebrile. Possibly related to steroids. Continue prednisone at chronic dose of 15 mg daily. BC negative. 7. Adrenal insufficiency. Pt is chronically on prednisone, but was started on stress dose of hydrocortisone on initial presentation. Since she has clinically improved, she has been transitioned back to baseline prednisone dosing. 8. Hypokalemia. Resolved.   9. Hyponatremia. Resolved in the setting of lasix. Continue lasix.  10. Severe malnutrition in the context of acute illness/injury. Nutrition following. 11. Morbid obesity. Nutrition following   DVT prophylaxis: xarelto  Code Status: Full Family Communication: Family bedside  Disposition Plan: Discharge once improved    Consultants:   GI  Nutrition   General surgery  Nephrology  PT  Procedures:   None   Antimicrobials:   Vanc   7/23 >>  Flagyl 7/19 >> 7/30  Subjective: Had 3 bowel movements this morning. No vomiting. Has nausea.  Objective: Vitals:   11/12/15 [id number] 11/12/15 0809  11/12/15 0811 11/12/15 0813  BP: (!) 127/91     Pulse: (!) 102     Resp: 18     Temp: 97.9 F (36.6 C)     TempSrc: Oral     SpO2: 98%  98% 100%  Weight:  (!) 136.3  kg (300 lb 6.4 oz)    Height:        Intake/Output Summary (Last 24 hours) at 11/12/15 0836 Last data filed at 11/12/15 0500  Gross per 24 hour  Intake              332 ml  Output             1400 ml  Net            -1068 ml   Filed Weights   11/10/15 1449 11/11/15 0613 11/12/15 0809  Weight: 135.2 kg (298 lb) 135 kg (297 lb 9.9 oz) (!) 136.3 kg (300 lb 6.4 oz)   Examination: General exam: Appears calm and comfortable  Respiratory system: Clear to auscultation. Respiratory effort normal. Cardiovascular system: S1 & S2 heard, RRR. No JVD, murmurs, rubs, gallops or clicks. 2+ pedal edema. Gastrointestinal system: Abdomen is obese, soft and nontender. No organomegaly or masses felt. Normal bowel sounds heard. Central nervous system: Alert and oriented. No focal neurological deficits. Extremities: Symmetric 5 x 5 power. Skin: No rashes, lesions or ulcers Psychiatry: Judgement and insight appear normal. Mood & affect appropriate.   Data Reviewed: I have personally reviewed following labs and imaging studies  CBC:  Recent Labs Lab 11/08/15 0500 11/09/15 0700 11/10/15 0619 11/11/15 0631 11/12/15 0458  WBC 23.2* 19.2* 17.3* 15.8* 15.0*  HGB 12.9 13.0 12.1 12.0 12.0  HCT 37.7 38.1 35.2* 35.5* 35.4*  MCV 82.7 83.2 84.4 84.7 84.7  PLT 214 203 229 278 0000000   Basic Metabolic Panel:  Recent Labs Lab 11/06/15 0425  11/08/15 0500 11/09/15 0700 11/10/15 0619 11/11/15 0631 11/12/15 0458  NA 129*  < > 133* 135  135 132* 135 135  K 3.7  < > 3.3* 3.5  3.6 3.4* 3.9 3.9  CL 101  < > 102 103  103 103 101 103  CO2 21*  < > 23 23  24 23 24 25   GLUCOSE 120*  < > 120* 118*  117* 104* 92 87  BUN 36*  < > 30* 29*  30* 27* 29* 30*  CREATININE 1.83*  < > 1.64* 1.68*  1.64* 1.57* 1.66* 1.70*  CALCIUM 7.5*  < > 7.7* 7.7*  7.7* 7.6* 8.2* 8.2*  MG 1.8  --   --   --   --   --   --   PHOS 4.2  < > 3.3 3.1 3.3 3.6 4.0  < > = values in this interval not displayed. GFR: Estimated  Creatinine Clearance: 48.8 mL/min (by C-G formula based on SCr of 1.7 mg/dL). Liver Function Tests:  Recent Labs Lab 11/08/15 0500 11/09/15 0700 11/10/15 0619 11/11/15 0631 11/12/15 0458  AST  --  23  --   --   --   ALT  --  12*  --   --   --   ALKPHOS  --  64  --   --   --   BILITOT  --  0.4  --   --   --   PROT  --  4.3*  --   --   --   ALBUMIN 2.6* 2.4*  2.4* 2.4* 2.5* 2.5*  CBG:  Recent Labs Lab 11/10/15 0809  GLUCAP 107*   Urine analysis:    Component Value Date/Time   COLORURINE AMBER (A) 11/10/2015 1000   APPEARANCEUR HAZY (A) 11/10/2015 1000   LABSPEC 1.020 11/10/2015 1000   PHURINE 5.5 11/10/2015 1000   GLUCOSEU NEGATIVE 11/10/2015 1000   HGBUR MODERATE (A) 11/10/2015 1000   BILIRUBINUR NEGATIVE 11/10/2015 1000   KETONESUR NEGATIVE 11/10/2015 1000   PROTEINUR TRACE (A) 11/10/2015 1000   UROBILINOGEN 1.0 05/12/2014 1319   NITRITE NEGATIVE 11/10/2015 1000   LEUKOCYTESUR NEGATIVE 11/10/2015 1000   Sepsis Labs: @LABRCNTIP (procalcitonin:4,lacticidven:4)  ) Recent Results (from the past 240 hour(s))  Culture, Urine     Status: None   Collection Time: 11/02/15  4:02 PM  Result Value Ref Range Status   Specimen Description URINE, CATHETERIZED  Final   Special Requests Immunocompromised  Final   Culture NO GROWTH Performed at [facility]   Final   Report Status 11/03/2015 FINAL  Final  MRSA PCR Screening     Status: None   Collection Time: 11/03/15 12:10 PM  Result Value Ref Range Status   MRSA by PCR NEGATIVE NEGATIVE Final    Comment:        The GeneXpert MRSA Assay (FDA approved for NASAL specimens only), is one component of a comprehensive MRSA colonization surveillance program. It is not intended to diagnose MRSA infection nor to guide or monitor treatment for MRSA infections.   Culture, blood (routine x 2)     Status: None (Preliminary result)   Collection Time: 11/08/15 10:30 AM  Result Value Ref Range Status   Specimen  Description RIGHT ANTECUBITAL  Final   Special Requests BOTTLES DRAWN AEROBIC AND ANAEROBIC 4CC EACH  Final   Culture NO GROWTH 3 DAYS  Final   Report Status PENDING  Incomplete  Culture, blood (routine x 2)     Status: None (Preliminary result)   Collection Time: 11/08/15 10:45 AM  Result Value Ref Range Status   Specimen Description BLOOD PICC LINE DRAW  Final   Special Requests BOTTLES DRAWN AEROBIC AND ANAEROBIC 6CC EACH  Final   Culture NO GROWTH 3 DAYS  Final   Report Status PENDING  Incomplete    Scheduled Meds: . budesonide  0.5 mg Nebulization BID  . citalopram  20 mg Oral QHS  . DULoxetine  30 mg Oral Daily  . feeding supplement  1 Container Oral TID BM  . feeding supplement (PRO-STAT SUGAR FREE 64)  30 mL Oral BID  . furosemide  120 mg Intravenous BID  . gabapentin  600 mg Oral TID  . heparin subcutaneous  5,000 Units Subcutaneous Q8H  . latanoprost  1 drop Both Eyes QHS  . loratadine  10 mg Oral Daily  . montelukast  10 mg Oral QHS  . nystatin  5 mL Oral QID  . potassium chloride  40 mEq Oral BID  . predniSONE  15 mg Oral Q breakfast  . promethazine  12.5 mg Oral TID AC & HS  . saccharomyces boulardii  250 mg Oral BID  . sodium chloride flush  10-40 mL Intracatheter Q12H  . tiotropium  1 capsule Inhalation Daily  . vancomycin  500 mg Oral Q6H   Continuous Infusions:    LOS: 13 days    Time spent: 20 minutes     Kathie Dike, MD [facility] If 7PM-7AM, please contact night-coverage [email or web address] Password [facility] 11/12/2015, 8:36 AM

## 2015-11-12 NOTE — Progress Notes (Signed)
Subjective: Interval History: Patient feels much better. Her diarrhea is improving. Presently she has some nausea but no vomiting. She has also some abdominal pain but not as bad as before.  Objective: Vital signs in isn't requiring me I try always a st 24 hours: Temp:  [97.9 F (36.6 C)-98 F (36.7 C)] 97.9 F (36.6 C) (08/01 0512) Pulse Rate:  [64-103] 102 (08/01 0512) Resp:  [18-20] 18 (08/01 0512) BP: (101-136)/(54-98) 127/91 (08/01 0512) SpO2:  [97 %-100 %] 100 % (08/01 0813) Weight:  [136.3 kg (300 lb 6.4 oz)] 136.3 kg (300 lb 6.4 oz) (08/01 0809) Weight change:   Intake/Output from previous day: 07/31 0701 - 08/01 0700 In: 572 [P.O.:480; I.V.:30; IV Piggyback:62] Out: 1400 [id number] Intake/Output this shift: No intake/output data recorded.  Generally patient is alert and in no apparent distress Chest is clear to auscultation Heart exam revealed regular rate and rhythm no murmur Abdomen: Somewhat distended, mildly tender, no rebound tenderness and positive bowel sound Extremities 2-3+ edema  Lab Results:  Recent Labs  11/11/15 0631 11/12/15 0458  WBC 15.8* 15.0*  HGB 12.0 12.0  HCT 35.5* 35.4*  PLT 278 318   BMET:   Recent Labs  11/11/15 0631 11/12/15 0458  NA 135 135  K 3.9 3.9  CL 101 103  CO2 24 25  GLUCOSE 92 87  BUN 29* 30*  CREATININE 1.66* 1.70*  CALCIUM 8.2* 8.2*   No results for input(s): PTH in the last 72 hours. Iron Studies: No results for input(s): IRON, TIBC, TRANSFERRIN, FERRITIN in the last 72 hours.  Studies/Results: No results found.  I have reviewed the patient's current medications.  Assessment/Plan: Problem #1 acute kidney injury: Her Creatinine is slightly increasing. Problem #2 . Pancolitis: Her diarrhea is improving. Presently being followed by GI. Problem #3 history of asthma: She is on inhalers. Patient does not have any complaints.  Problem #4 low CO2: Respiratory alkalosis. Her CO2 is stable. Problem #5  hypophosphatemia: Her phosphorus is normal Problem #6 hypokalemia: . Potassium has corrected. Patient on potassium supplement. Problem #7 hypocalcemia on calcium supplement. Her calcium is normal. Problem #8 anasarca: Patient presently on Lasix.. She has 1400 mL of urine output. Presently her anasarca seems to be worsening. Problem #9 hypotension: Her blood pressure is in normal range Plan: 1] we'll increase Lasix to 200 mg IV twice a day 2] we'll add metolazone 5 mg by mouth twice a day 2] we'll check comprehensive metabolic panel in the morning.   LOS: 13 days   Javaya [place] S 11/12/2015,8:38 AM

## 2015-11-12 NOTE — Clinical Social Work Note (Signed)
Clinical Social Work Assessment  Patient Details  Name: Deborah Murray MRN: [id number] Date of Birth: May 15, 1960  Date of referral:  11/12/15               Reason for consult:  Facility Placement                Permission sought to share information with:    Permission granted to share information::     Name::        Agency::     Relationship::     Contact Information:     Housing/Transportation Living arrangements for the past 2 months:  Single Family Home Source of Information:  Patient Patient Interpreter Needed:  None Criminal Activity/Legal Involvement Pertinent to Current Situation/Hospitalization:  No - Comment as needed Significant Relationships:  Adult Children Lives with:  Self Do you feel safe going back to the place where you live?  No (needs rehab first) Need for family participation in patient care:  No (Coment)  Care giving concerns:  Pt lives alone and is very deconditioned and not progressing.    Social Worker assessment / plan:  CSW met with pt at bedside. Pt alert and oriented and reports she lives alone. Her sister was sleeping in room. Pt indicates her best support is her daughter. At baseline, pt is completely independent and still drives. She states she had back surgery a month ago and has used a walker since then. CSW discussed PT recommendations now for SNF and pt is agreeable, requesting [facility] if possible. Aware of Medicare coverage/criteria.   Employment status:  Disabled (Comment on whether or not currently receiving Disability) Insurance information:  Medicare PT Recommendations:  [place] / Referral to community resources:  [place]  Patient/Family's Response to care:  Pt is agreeable to short term SNF prior to return home.   Patient/Family's Understanding of and Emotional Response to Diagnosis, Current Treatment, and Prognosis:  Pt is aware of admission diagnosis and treatment plan as well as d/c  recommendations for SNF. Pt agrees this would be best and has positive feelings regarding rehab.   Emotional Assessment Appearance:  Appears stated age Attitude/Demeanor/Rapport:  Other (Cooperative) Affect (typically observed):  Appropriate Orientation:  Oriented to Self, Oriented to Place, Oriented to  Time, Oriented to Situation Alcohol / Substance use:  Not Applicable Psych involvement (Current and /or in the community):  No (Comment)  Discharge Needs  Concerns to be addressed:  Discharge Planning Concerns Readmission within the last 30 days:  No Current discharge risk:  Lives alone, Physical Impairment Barriers to Discharge:  Continued Medical Work up   [employer], [employer], [place] 11/12/2015, 8:30 AM [phone]

## 2015-11-12 NOTE — NC FL2 (Signed)
[place] MEDICAID FL2 LEVEL OF CARE SCREENING TOOL     IDENTIFICATION  Patient Name: Deborah Murray Birthdate: Oct 08, 1960 Sex: female Admission Date (Current Location): 10/30/2015  [place] and [place] Number:  [place] [id number] [place] and Address:  [facility] [address] [place]      Provider Number: [id number]  Attending Physician Name and Address:  Kathie Dike, MD  Relative Name and Phone Number:       Current Level of Care: Hospital Recommended Level of Care: [place] Prior Approval Number:    Date Approved/Denied:   PASRR Number: [id number]  Discharge Plan: SNF    Current Diagnoses: Patient Active Problem List   Diagnosis Date Noted  . Protein-calorie malnutrition, severe 11/05/2015  . Sepsis ([place]) 11/04/2015  . AKI (acute kidney injury) ([place])   . Pyrexia   . Acute kidney injury ([place]) 11/02/2015  . Hyponatremia 11/02/2015  . Metabolic acidosis [id number]  . Hypocalcemia 11/01/2015  . C. difficile colitis 10/31/2015  . Pancolitis ([place]) 10/30/2015  . C. difficile diarrhea 10/30/2015  . Abdominal pain in female 10/30/2015  . Hypotension 10/30/2015  . History of atrial fibrillation- NSR now 10/30/2015  . History of adrenal insufficiency 10/30/2015  . History of hypertension 10/30/2015  . Esophageal reflux   . Nausea   . GERD (gastroesophageal reflux disease) 08/28/2015  . Nausea without vomiting 08/28/2015  . Abdominal pain, epigastric 08/28/2015  . Dark stools 08/28/2015  . Atrial fibrillation with RVR ([place]) 05/12/2014  . Hypokalemia 05/12/2014  . Essential hypertension 05/12/2014  . Severe persistent asthma 05/12/2014  . OSA on CPAP 05/12/2014  . Toxic nodular goiter 05/12/2014  . Morbid obesity ([place]) 05/12/2014  . Pain in joint, shoulder region 08/02/2013  . Muscle weakness (generalized) 08/02/2013  . Decreased range of motion of left shoulder 08/02/2013  . Tight fascia 08/02/2013     Orientation RESPIRATION BLADDER Height & Weight     Self, Time, Situation, Place  O2 (8 L) Continent Weight: (!) 300 lb 6.4 oz (136.3 kg) Height:  5\' 2"  (157.5 cm)  BEHAVIORAL SYMPTOMS/MOOD NEUROLOGICAL BOWEL NUTRITION STATUS  Other (Comment) (n/a) Convulsions/Seizures (n/a) Incontinent Diet (heart healthy)  AMBULATORY STATUS COMMUNICATION OF NEEDS Skin   Total Care Verbally Other (Comment) (Left leg blister. [place] to left buttocks with foam dressing. Open skin to right, upper back with foam dressing changes every 3 days. Moisture associated skin damage to perineum. Skin tear to back with foam dressing. )                       Personal Care Assistance Level of Assistance   (Independent at baseline.) Bathing Assistance: Maximum assistance Feeding assistance: Limited assistance Dressing Assistance: Maximum assistance     Functional Limitations Info  Sight, Hearing, Speech Sight Info: Impaired Hearing Info: Adequate Speech Info: Adequate    SPECIAL CARE FACTORS FREQUENCY  PT (By licensed PT)     PT Frequency: 5              Contractures Contractures Info: Not present    Additional Factors Info  Isolation Precautions Code Status Info: Full code Allergies Info: Aspirin, Codeine Sulfate, Darvocet (Propoxyphene N-acetaminophen), Tramadol, Penicillins     Isolation Precautions Info: Enteric     Current Medications (11/12/2015):  This is the current hospital active medication list Current Facility-Administered Medications  Medication Dose Route Frequency Provider Last Rate Last Dose  . albuterol (PROVENTIL) (2.5 MG/3ML) 0.083% nebulizer solution 2.5 mg  2.5 mg Nebulization Q6H PRN Roney Jaffe, MD      . alum & mag hydroxide-simeth (MAALOX/MYLANTA) 200-200-20 MG/5ML suspension 15 mL  15 mL Oral Q6H PRN Kathie Dike, MD   15 mL at 11/07/15 1319  . budesonide (PULMICORT) nebulizer solution 0.5 mg  0.5 mg Nebulization BID Roney Jaffe, MD   0.5 mg at 11/12/15  0811  . citalopram (CELEXA) tablet 20 mg  20 mg Oral QHS Roney Jaffe, MD   20 mg at 11/11/15 2000  . DULoxetine (CYMBALTA) DR capsule 30 mg  30 mg Oral Daily Roney Jaffe, MD   30 mg at 11/11/15 1058  . famotidine (PEPCID) tablet 20 mg  20 mg Oral BID PRN Danie Binder, MD      . feeding supplement (BOOST / RESOURCE BREEZE) liquid 1 Container  1 Container Oral TID BM Danie Binder, MD   1 Container at 11/11/15 2000  . feeding supplement (PRO-STAT SUGAR FREE 64) liquid 30 mL  30 mL Oral BID Kathie Dike, MD   30 mL at 11/11/15 2000  . furosemide (LASIX) 120 mg in dextrose 5 % 50 mL IVPB  120 mg Intravenous BID Fran Lowes, MD   120 mg at 11/11/15 2008  . gabapentin (NEURONTIN) capsule 600 mg  600 mg Oral TID Roney Jaffe, MD   600 mg at 11/11/15 2000  . heparin injection 5,000 Units  5,000 Units Subcutaneous Q8H Rexene Alberts, MD   5,000 Units at 11/12/15 0600  . hydrALAZINE (APRESOLINE) tablet 25 mg  25 mg Oral Q6H PRN Oswald Hillock, MD   25 mg at 11/07/15 0139  . HYDROmorphone (DILAUDID) injection 0.5-1 mg  0.5-1 mg Intravenous Q3H PRN Rexene Alberts, MD   1 mg at 11/12/15 [id number]  . latanoprost (XALATAN) 0.005 % ophthalmic solution 1 drop  1 drop Both Eyes QHS Roney Jaffe, MD   1 drop at 11/11/15 2002  . loratadine (CLARITIN) tablet 10 mg  10 mg Oral Daily Roney Jaffe, MD   10 mg at 11/11/15 1100  . montelukast (SINGULAIR) tablet 10 mg  10 mg Oral QHS Roney Jaffe, MD   10 mg at 11/11/15 2000  . nystatin (MYCOSTATIN) 100000 UNIT/ML suspension 500,000 Units  5 mL Oral QID Orvil Feil, NP   500,000 Units at 11/10/15 2157  . ondansetron (ZOFRAN) tablet 4 mg  4 mg Oral Q6H PRN Roney Jaffe, MD   4 mg at 11/01/15 1150   Or  . ondansetron (ZOFRAN) injection 4 mg  4 mg Intravenous Q6H PRN Roney Jaffe, MD   4 mg at 11/12/15 [id number]  . potassium chloride SA (K-DUR,KLOR-CON) CR tablet 40 mEq  40 mEq Oral BID Fran Lowes, MD   40 mEq at 11/11/15 2000  . predniSONE (DELTASONE)  tablet 15 mg  15 mg Oral Q breakfast Kathie Dike, MD   15 mg at 11/11/15 0851  . promethazine (PHENERGAN) tablet 12.5 mg  12.5 mg Oral TID AC & HS Danie Binder, MD   12.5 mg at 11/11/15 2007  . saccharomyces boulardii (FLORASTOR) capsule 250 mg  250 mg Oral BID Kathie Dike, MD   250 mg at 11/11/15 2000  . sodium chloride flush (NS) 0.9 % injection 10-40 mL  10-40 mL Intracatheter Q12H Kathie Dike, MD   10 mL at 11/11/15 2200  . sodium chloride flush (NS) 0.9 % injection 10-40 mL  10-40 mL Intracatheter PRN Kathie Dike, MD   10 mL at 11/11/15 1159  . tiotropium (SPIRIVA)  inhalation capsule 18 mcg  1 capsule Inhalation Daily Roney Jaffe, MD   18 mcg at 11/12/15 0813  . vancomycin (VANCOCIN) 50 mg/mL oral solution 500 mg  500 mg Oral Q6H Orvan Falconer, MD   500 mg at 11/12/15 0600     Discharge Medications: Please see discharge summary for a list of discharge medications.  Relevant Imaging Results:  Relevant Lab Results:   Additional Information SSN: [id number]. Needs bariatric bed with air mattress.   Salome Arnt, LCSW

## 2015-11-12 NOTE — [place] FL2 (Deleted)
[place] MEDICAID FL2 LEVEL OF CARE SCREENING TOOL     IDENTIFICATION  Patient Name: Deborah Murray Birthdate: [birth year] Sex: female Admission Date (Current Location): 10/30/2015  [place] and [place] Number:  [place] [id number] [place] and Address:  [facility] [address], [place]      Provider Number: [id number]  Attending Physician Name and Address:  Kathie Dike, MD  Relative Name and Phone Number:       Current Level of Care: Hospital Recommended Level of Care: [place] Prior Approval Number:    Date Approved/Denied:   PASRR Number: [id number]  Discharge Plan: SNF    Current Diagnoses: Patient Active Problem List   Diagnosis Date Noted  . Protein-calorie malnutrition, severe 11/05/2015  . Sepsis ([place]) 11/04/2015  . AKI (acute kidney injury) ([place])   . Pyrexia   . Acute kidney injury ([place]) 11/02/2015  . Hyponatremia 11/02/2015  . Metabolic acidosis [id number]  . Hypocalcemia 11/01/2015  . C. difficile colitis 10/31/2015  . Pancolitis ([place]) 10/30/2015  . C. difficile diarrhea 10/30/2015  . Abdominal pain in female 10/30/2015  . Hypotension 10/30/2015  . History of atrial fibrillation- NSR now 10/30/2015  . History of adrenal insufficiency 10/30/2015  . History of hypertension 10/30/2015  . Esophageal reflux   . Nausea   . GERD (gastroesophageal reflux disease) 08/28/2015  . Nausea without vomiting 08/28/2015  . Abdominal pain, epigastric 08/28/2015  . Dark stools 08/28/2015  . Atrial fibrillation with RVR ([place]) 05/12/2014  . Hypokalemia 05/12/2014  . Essential hypertension 05/12/2014  . Severe persistent asthma 05/12/2014  . OSA on CPAP 05/12/2014  . Toxic nodular goiter 05/12/2014  . Morbid obesity ([place]) 05/12/2014  . Pain in joint, shoulder region 08/02/2013  . Muscle weakness (generalized) 08/02/2013  . Decreased range of motion of left shoulder 08/02/2013  . Tight fascia 08/02/2013     Orientation RESPIRATION BLADDER Height & Weight     Self, Time, Situation, Place  O2 (8 L) Continent Weight: (!) 300 lb 6.4 oz (136.3 kg) Height:  5\' 2"  (157.5 cm)  BEHAVIORAL SYMPTOMS/MOOD NEUROLOGICAL BOWEL NUTRITION STATUS  Other (Comment) (n/a) Convulsions/Seizures (n/a) Incontinent Diet (heart healthy)  AMBULATORY STATUS COMMUNICATION OF NEEDS Skin   Total Care Verbally Other (Comment) (Left leg blister. [place] to left buttocks with foam dressing. Open skin to right, upper back with foam dressing changes every 3 days. Moisture associated skin damage to perineum. Skin tear to back with foam dressing. )                       Personal Care Assistance Level of Assistance   (Independent at baseline.) Bathing Assistance: Maximum assistance Feeding assistance: Limited assistance Dressing Assistance: Maximum assistance     Functional Limitations Info  Sight, Hearing, Speech Sight Info: Impaired Hearing Info: Adequate Speech Info: Adequate    SPECIAL CARE FACTORS FREQUENCY  PT (By licensed PT)     PT Frequency: 5              Contractures Contractures Info: Not present    Additional Factors Info  Isolation Precautions Code Status Info: Full code Allergies Info: Aspirin, Codeine Sulfate, Darvocet (Propoxyphene N-acetaminophen), Tramadol, Penicillins     Isolation Precautions Info: Enteric     Current Medications (11/12/2015):  This is the current hospital active medication list Current Facility-Administered Medications  Medication Dose Route Frequency Provider Last Rate Last Dose  . albuterol (PROVENTIL) (2.5 MG/3ML) 0.083% nebulizer solution 2.5 mg  2.5 mg Nebulization Q6H PRN Roney Jaffe, MD      . alum & mag hydroxide-simeth (MAALOX/MYLANTA) 200-200-20 MG/5ML suspension 15 mL  15 mL Oral Q6H PRN Kathie Dike, MD   15 mL at 11/07/15 1319  . budesonide (PULMICORT) nebulizer solution 0.5 mg  0.5 mg Nebulization BID Roney Jaffe, MD   0.5 mg at 11/12/15  0811  . citalopram (CELEXA) tablet 20 mg  20 mg Oral QHS Roney Jaffe, MD   20 mg at 11/11/15 2000  . DULoxetine (CYMBALTA) DR capsule 30 mg  30 mg Oral Daily Roney Jaffe, MD   30 mg at 11/11/15 1058  . famotidine (PEPCID) tablet 20 mg  20 mg Oral BID PRN Danie Binder, MD      . feeding supplement (BOOST / RESOURCE BREEZE) liquid 1 Container  1 Container Oral TID BM Danie Binder, MD   1 Container at 11/11/15 2000  . feeding supplement (PRO-STAT SUGAR FREE 64) liquid 30 mL  30 mL Oral BID Kathie Dike, MD   30 mL at 11/11/15 2000  . furosemide (LASIX) 120 mg in dextrose 5 % 50 mL IVPB  120 mg Intravenous BID Fran Lowes, MD   120 mg at 11/11/15 2008  . gabapentin (NEURONTIN) capsule 600 mg  600 mg Oral TID Roney Jaffe, MD   600 mg at 11/11/15 2000  . heparin injection 5,000 Units  5,000 Units Subcutaneous Q8H Rexene Alberts, MD   5,000 Units at 11/12/15 0600  . hydrALAZINE (APRESOLINE) tablet 25 mg  25 mg Oral Q6H PRN Oswald Hillock, MD   25 mg at 11/07/15 0139  . HYDROmorphone (DILAUDID) injection 0.5-1 mg  0.5-1 mg Intravenous Q3H PRN Rexene Alberts, MD   1 mg at 11/12/15 [id number]  . latanoprost (XALATAN) 0.005 % ophthalmic solution 1 drop  1 drop Both Eyes QHS Roney Jaffe, MD   1 drop at 11/11/15 2002  . loratadine (CLARITIN) tablet 10 mg  10 mg Oral Daily Roney Jaffe, MD   10 mg at 11/11/15 1100  . montelukast (SINGULAIR) tablet 10 mg  10 mg Oral QHS Roney Jaffe, MD   10 mg at 11/11/15 2000  . nystatin (MYCOSTATIN) 100000 UNIT/ML suspension 500,000 Units  5 mL Oral QID Orvil Feil, NP   500,000 Units at 11/10/15 2157  . ondansetron (ZOFRAN) tablet 4 mg  4 mg Oral Q6H PRN Roney Jaffe, MD   4 mg at 11/01/15 1150   Or  . ondansetron (ZOFRAN) injection 4 mg  4 mg Intravenous Q6H PRN Roney Jaffe, MD   4 mg at 11/12/15 [id number]  . potassium chloride SA (K-DUR,KLOR-CON) CR tablet 40 mEq  40 mEq Oral BID Fran Lowes, MD   40 mEq at 11/11/15 2000  . predniSONE (DELTASONE)  tablet 15 mg  15 mg Oral Q breakfast Kathie Dike, MD   15 mg at 11/11/15 0851  . promethazine (PHENERGAN) tablet 12.5 mg  12.5 mg Oral TID AC & HS Danie Binder, MD   12.5 mg at 11/11/15 2007  . saccharomyces boulardii (FLORASTOR) capsule 250 mg  250 mg Oral BID Kathie Dike, MD   250 mg at 11/11/15 2000  . sodium chloride flush (NS) 0.9 % injection 10-40 mL  10-40 mL Intracatheter Q12H Kathie Dike, MD   10 mL at 11/11/15 2200  . sodium chloride flush (NS) 0.9 % injection 10-40 mL  10-40 mL Intracatheter PRN Kathie Dike, MD   10 mL at 11/11/15 1159  . tiotropium (SPIRIVA)  inhalation capsule 18 mcg  1 capsule Inhalation Daily Roney Jaffe, MD   18 mcg at 11/12/15 0813  . vancomycin (VANCOCIN) 50 mg/mL oral solution 500 mg  500 mg Oral Q6H Orvan Falconer, MD   500 mg at 11/12/15 0600     Discharge Medications: Please see discharge summary for a list of discharge medications.  Relevant Imaging Results:  Relevant Lab Results:   Additional Information SSN: [id number]  Salome Arnt, [place]

## 2015-11-12 NOTE — Progress Notes (Signed)
Physical Therapy Treatment Patient Details Name: Deborah Murray MRN: [id number] DOB: Sep 23, 1960 Today's Date: 11/12/2015    History of Present Illness 55 yo F admitted 10/30/2015 due to persistent abdominal pain and diarrhea.  She underwent back surgery 2 weeks ago at [facility].  Dx: pancolitis due to C-diff.  PMH: obesity, DJD, severe asthma, allergies, HTN, chronic back pain, Afib, and recent back surgery, syncope and collapse, thyroid disease, vocal cord dysfunction, lumbar radiculopathy, adrenal abnormality, COPD, gastric bypass, cholecystectomy, R knee surgery.    PT Comments    Pt received sitting up in the chair, and was agreeable to PT tx.  Pt required Mod/Max A for sit<>stand and stand pivot transfers from the bed<>BSC.  Pt fatigued during transfers.  She was able to complete LE exercises.  Pt is not safe to d/c home at this point due to poor mobility and decreased caregiver assistance.  She needs SNF to continue to progress with strength, endurance, and functional mobility.   Follow Up Recommendations  SNF     Equipment Recommendations  None recommended by PT    Recommendations for Other Services       Precautions / Restrictions Precautions Precautions: Back;Other (comment) Precaution Booklet Issued: No Precaution Comments: Recent spinal fusion 2 weeks prior to admission.  Restrictions Weight Bearing Restrictions: No    Mobility  Bed Mobility Overal bed mobility:  (NT)                Transfers Overall transfer level: Needs assistance Equipment used: Rolling walker (2 wheeled) Transfers: Sit to/from [facility] Sit to Stand: Mod assist Stand pivot transfers: Max assist       General transfer comment: Encouraged pt to rock back and forth prior to standing to enable her to get her weight shifted anteriorly.  Educated on safe hand placement.  Pt had difficulty advancing her LE's while performing the pivot over to the [facility].  B LE's are very edematous  right now.   Ambulation/Gait                 Stairs            Wheelchair Mobility    Modified Rankin (Stroke Patients Only)       Balance           Standing balance support: Bilateral upper extremity supported Standing balance-Leahy Scale: Fair                      Cognition Arousal/Alertness: Awake/alert Behavior During Therapy: WFL for tasks assessed/performed Overall Cognitive Status: Within Functional Limits for tasks assessed                      Exercises General Exercises - Lower Extremity Ankle Circles/Pumps: Strengthening;Both;Seated;Other (comment) (x 1 min) Long Arc Quad: Strengthening;Both;15 reps;Seated Hip Flexion/Marching: Strengthening;Both;10 reps;Limitations Hip Flexion/Marching Limitations: fatigue and decreased strength, along with body habitus.     General Comments        Pertinent Vitals/Pain Pain Assessment: No/denies pain    Home Living                      Prior Function            PT Goals (current goals can now be found in the care plan section) Acute Rehab PT Goals Patient Stated Goal: To go home PT Goal Formulation: With patient/family Time For Goal Achievement: 11/15/15 Potential to Achieve Goals: Good Progress towards PT  goals: Progressing toward goals    Frequency  Min 5X/week    PT Plan Current plan remains appropriate    Co-evaluation             End of Session Equipment Utilized During Treatment: Gait belt Activity Tolerance: Patient limited by fatigue Patient left: in chair;with call bell/phone within reach;with nursing/sitter in room     Time: [id number] PT Time Calculation (min) (ACUTE ONLY): 43 min  Charges:  $Therapeutic Exercise: 8-22 mins $Therapeutic Activity: 23-37 mins                    G Codes:      Beth Jeovani Weisenburger, PT, DPT X: [phone]

## 2015-11-12 NOTE — Clinical Social Work Placement (Signed)
   CLINICAL SOCIAL WORK PLACEMENT  NOTE  Date:  11/12/2015  Patient Details  Name: Deborah Murray MRN: [id number] Date of Birth: [birth year]  Clinical Social Work is seeking post-discharge placement for this patient at the [place] level of care (*CSW will initial, date and re-position this form in  chart as items are completed):  Yes   Patient/family provided with [place] Work Department's list of facilities offering this level of care within the geographic area requested by the patient (or if unable, by the patient's family).  Yes   Patient/family informed of their freedom to choose among providers that offer the needed level of care, that participate in Medicare, Medicaid or managed care program needed by the patient, have an available bed and are willing to accept the patient.  Yes   Patient/family informed of [place]'s ownership interest in [facility] and [facility], as well as of the fact that they are under no obligation to receive care at these facilities.  PASRR submitted to EDS on 11/11/15     PASRR number received on 11/11/15     Existing PASRR number confirmed on       FL2 transmitted to all facilities in geographic area requested by pt/family on 11/12/15     FL2 transmitted to all facilities within larger geographic area on       Patient informed that his/her managed care company has contracts with or will negotiate with certain facilities, including the following:            Patient/family informed of bed offers received.  Patient chooses bed at       Physician recommends and patient chooses bed at      Patient to be transferred to   on  .  Patient to be transferred to facility by       Patient family notified on   of transfer.  Name of family member notified:        PHYSICIAN       Additional Comment:    _______________________________________________ Salome Arnt, [place] 11/12/2015, 8:24  AM [phone]

## 2015-11-12 NOTE — Progress Notes (Signed)
    Subjective: Sitting up in chair. Soft BM this morning. Abdominal pain improved. Nausea improved. Wants a green apple, not red. Feels better. Wants to go home.   Objective: Vital signs in last 24 hours: Temp:  [97.9 F (36.6 C)-98 F (36.7 C)] 97.9 F (36.6 C) (08/01 0512) Pulse Rate:  [64-103] 97 (08/01 0958) Resp:  [18-20] 18 (08/01 0512) BP: (101-136)/(54-98) 125/95 (08/01 0958) SpO2:  [97 %-100 %] 100 % (08/01 0813) Weight:  [300 lb 6.4 oz (136.3 kg)] 300 lb 6.4 oz (136.3 kg) (08/01 0809) Last BM Date: 11/11/15 General:   Alert and oriented, pleasant Head:  Normocephalic and atraumatic. Abdomen:  Bowel sounds present, soft, non-tender, non-distended. No HSM or hernias noted.  Extremities:  With 2+ pitting lower extremity edema, pedal edema  Neurologic:  Alert and  oriented x4 Psych:  Alert and cooperative. Normal mood and affect.  Intake/Output from previous day: 07/31 0701 - 08/01 0700 In: 572 [P.O.:480; I.V.:30; IV Piggyback:62] Out: 1400 [id number] Intake/Output this shift: Total I/O In: 150 [P.O.:120; I.V.:30] Out: -   Lab Results:  Recent Labs  11/10/15 0619 11/11/15 0631 11/12/15 0458  WBC 17.3* 15.8* 15.0*  HGB 12.1 12.0 12.0  HCT 35.2* 35.5* 35.4*  PLT 229 278 318   BMET  Recent Labs  11/10/15 0619 11/11/15 0631 11/12/15 0458  NA 132* 135 135  K 3.4* 3.9 3.9  CL 103 101 103  CO2 23 24 25   GLUCOSE 104* 92 87  BUN 27* 29* 30*  CREATININE 1.57* 1.66* 1.70*  CALCIUM 7.6* 8.2* 8.2*   LFT  Recent Labs  11/10/15 0619 11/11/15 0631 11/12/15 0458  PROT  --   --  4.6*  ALBUMIN 2.4* 2.5* 2.5*  2.5*  AST  --   --  20  ALT  --   --  9*  ALKPHOS  --   --  51  BILITOT  --   --  0.6  BILIDIR  --   --  0.1  IBILI  --   --  0.5    Assessment: 55 year old female admitted with severe/complicated Cdiff colitis and clinically improving.  Leukocytosis greatly improved from admission, with persisting mild leukocytosis likely multifactorial in  setting of steroids. Off Flagyl now, will need to continue Vanc and follow taper as outlined on progress not dated 7/26.   Anasarca: in setting of hypoalbuminemia, acute illness. Improving.   Adrenal insufficiency: now on oral steroids.  Acute kidney injury: appreciate nephrology. Improving.   Nausea: without vomiting. Tolerating diet. Continue Phenergan 12.5 mg TID before meals and bedtime.  Zofran without improvement previously. Would limit Pepcid to only prn as already doing due to mixed literature regarding H2 blocker/PPI therapy and Cdiff. Continues to improve.     Plan: Vanc with taper thereafter Continue probiotic Continue scheduled phenergan for now Zofran prn Pepcid only prn Continue Nystatin Soft diet Continue supportive measures   Orvil Feil, ANP-BC [facility]    LOS: 13 days    11/12/2015, 12:29 PM

## 2015-11-13 LAB — BASIC METABOLIC PANEL
ANION GAP: 10 (ref 5–15)
BUN: 28 mg/dL — ABNORMAL HIGH (ref 6–20)
CALCIUM: 8.2 mg/dL — AB (ref 8.9–10.3)
CHLORIDE: 100 mmol/L — AB (ref 101–111)
CO2: 25 mmol/L (ref 22–32)
Creatinine, Ser: 1.59 mg/dL — ABNORMAL HIGH (ref 0.44–1.00)
GFR calc non Af Amer: 35 mL/min — ABNORMAL LOW (ref >60)
GFR, EST AFRICAN AMERICAN: 41 mL/min — AB (ref >60)
Glucose, Bld: 81 mg/dL (ref 65–99)
Potassium: 2.7 mmol/L — CL (ref 3.5–5.1)
Sodium: 135 mmol/L (ref 135–145)

## 2015-11-13 LAB — CULTURE, BLOOD (ROUTINE X 2)
CULTURE: NO GROWTH
Culture: NO GROWTH

## 2015-11-13 LAB — CBC
HEMATOCRIT: 33.8 % — AB (ref 36.0–46.0)
Hemoglobin: 11.3 g/dL — ABNORMAL LOW (ref 12.0–15.0)
MCH: 28.6 pg (ref 26.0–34.0)
MCHC: 33.4 g/dL (ref 30.0–36.0)
MCV: 85.6 fL (ref 78.0–100.0)
Platelets: 334 10*3/uL (ref 150–400)
RBC: 3.95 MIL/uL (ref 3.87–5.11)
RDW: 17.1 % — ABNORMAL HIGH (ref 11.5–15.5)
WBC: 11.8 10*3/uL — ABNORMAL HIGH (ref 4.0–10.5)

## 2015-11-13 MED ORDER — POTASSIUM CHLORIDE 10 MEQ/100ML IV SOLN
10.0000 meq | INTRAVENOUS | Status: AC
  Administered 2015-11-13 (×3): 10 meq via INTRAVENOUS
  Filled 2015-11-13: qty 100

## 2015-11-13 NOTE — Progress Notes (Signed)
    Subjective: Abdominal pain the same. Wants to eat popcorn, green apples. Stool consistency improving. Nausea better.   Objective: Vital signs in last 24 hours: Temp:  [98.2 F (36.8 C)-98.5 F (36.9 C)] 98.2 F (36.8 C) (08/02 0658) Pulse Rate:  [90-100] 90 (08/02 0658) Resp:  [20] 20 (08/02 0658) BP: (97-128)/(77-81) 97/81 (08/02 0658) SpO2:  [97 %-100 %] 97 % (08/02 0954) Last BM Date: 11/12/15 General:   Alert and oriented, pleasant Head:  Normocephalic and atraumatic. Abdomen:  Bowel sounds present, soft, mild TTP diffusely.  Extremities:  Improved edema bilateral lower extremities, 2+ pitting lower extremities, pedal edema  Neurologic:  Alert and  oriented x4 Psych:  Alert and cooperative. Normal mood and affect.  Intake/Output from previous day: 08/01 0701 - 08/02 0700 In: 530 [P.O.:480; I.V.:50] Out: 3500 [Urine:3500] Intake/Output this shift: No intake/output data recorded.  Lab Results:  Recent Labs  11/11/15 0631 11/12/15 0458 11/13/15 0509  WBC 15.8* 15.0* 11.8*  HGB 12.0 12.0 11.3*  HCT 35.5* 35.4* 33.8*  PLT 278 318 334   BMET  Recent Labs  11/11/15 0631 11/12/15 0458 11/13/15 0509  NA 135 135 135  K 3.9 3.9 2.7*  CL 101 103 100*  CO2 24 25 25   GLUCOSE 92 87 81  BUN 29* 30* 28*  CREATININE 1.66* 1.70* 1.59*  CALCIUM 8.2* 8.2* 8.2*   LFT  Recent Labs  11/11/15 0631 11/12/15 0458  PROT  --  4.6*  ALBUMIN 2.5* 2.5*  2.5*  AST  --  20  ALT  --  9*  ALKPHOS  --  51  BILITOT  --  0.6  BILIDIR  --  0.1  IBILI  --  0.5    Assessment: 55 year old female admitted with severe/complicatedCdiff colitis and clinically improving.  Leukocytosis greatly improved from admission, with persisting mild leukocytosis likely multifactorial in setting of steroids. Off Flagyl now, will need to continue Vanc and follow taper as outlined on progress not dated 7/26.   Anasarca: in setting of hypoalbuminemia, acute illness. Continues to improve.    Adrenal insufficiency: now on oral steroids.  Acute kidney injury: appreciate nephrology. Improving.   Nausea: without vomiting. Tolerating diet. Continue Phenergan 12.5 mg TID before meals and bedtime.  Zofran without improvement previously. Would limit Pepcid to only prn as already doing due to mixed literature regarding H2 blocker/PPI therapy and Cdiff. Continues to improve.  Overall, patient has continued to clinically improve with hopeful discharge to rehab in near future.    Plan: Continue Vancomycin with taper thereafter Continue probiotic Continue scheduled phenergan Zofran and Pepcid prn Continue Nystatin Soft diet Supportive measures Hopeful discharge to rehab in near future   Orvil Feil, ANP-BC [facility]    LOS: 14 days    11/13/2015, 10:24 AM

## 2015-11-13 NOTE — Clinical Social Work Placement (Signed)
   CLINICAL SOCIAL WORK PLACEMENT  NOTE  Date:  11/13/2015  Patient Details  Name: CAELIN CASSEL MRN: [id number] Date of Birth: 16-Oct-1960  Clinical Social Work is seeking post-discharge placement for this patient at the [place] level of care (*CSW will initial, date and re-position this form in  chart as items are completed):  Yes   Patient/family provided with [place] Work Department's list of facilities offering this level of care within the geographic area requested by the patient (or if unable, by the patient's family).  Yes   Patient/family informed of their freedom to choose among providers that offer the needed level of care, that participate in Medicare, Medicaid or managed care program needed by the patient, have an available bed and are willing to accept the patient.  Yes   Patient/family informed of 's ownership interest in [facility] and [facility], as well as of the fact that they are under no obligation to receive care at these facilities.  PASRR submitted to EDS on 11/11/15     PASRR number received on 11/11/15     Existing PASRR number confirmed on       FL2 transmitted to all facilities in geographic area requested by pt/family on 11/12/15     FL2 transmitted to all facilities within larger geographic area on       Patient informed that his/her managed care company has contracts with or will negotiate with certain facilities, including the following:        Yes   Patient/family informed of bed offers received.  Patient [facility] bed at [facility]     Physician recommends and patient chooses bed at      Patient to be transferred to [facility] on  .  Patient to be transferred to facility by       Patient family notified on   of transfer.  Name of family member notified:        PHYSICIAN       Additional Comment:    _______________________________________________ Salome Arnt, [place] 11/13/2015, 10:51 AM [phone]

## 2015-11-13 NOTE — Care Management Important Message (Signed)
Important Message  Patient Details  Name: Deborah Murray MRN: [id number] Date of Birth: [birth year]   Medicare Important Message Given:  Yes    Rosely Fernandez, [place], RN 11/13/2015, 12:01 PM

## 2015-11-13 NOTE — Progress Notes (Signed)
Deborah Mohs, MD notified of critical potassium level of 2.7

## 2015-11-13 NOTE — Progress Notes (Signed)
PROGRESS NOTE  Deborah Murray [id number] DOB: 03/07/1961 DOA: 10/30/2015 PCP: Sol Passer, MD  Brief Narrative: 55 year old woman with a hx of afib HTN, adrenal insufficiency and recent back surgery presented with complaints of abdominal pain and diarrhea. CT a/p revealed diffused edematous wall thickening compatible with pancolitis. C diff positive. Follow up CT revealed worsening pancolitis. Surgery and GI were consulted. Conservative management was pursued. Additionally, nephrology was consulted for acute kidney injury and anasarca. Nephrology following. Since patient has been hospitalized for some time, PT has evaluated the patient and recommends SNF upon discharge. She has been slow to progress  Assessment/Plan: 1. Pancolitis secondary to C Diff colitis. Improving. Tolerating diet. Bowel movement frequency has decreased. GI and general surgery following, input appreciated. Surgical intervention has not been deemed necessary. Patient has been evaluated by PT who recommends SNF upon discharge. She is tolerating a solid diet and feels that she is improving.  2. Essential HTN. Initially hypotensive on admission, but this was resolved with IV fluids and neo-synephrine.  3. AKI. IV fluids have been discontinued due to development of volume overload/anasarca. Nephrology has been consulted and is adjusting Lasix. 4. Anasarca. Likely related to initial aggressive hydration in the setting of hypoalbuminemia.  5. Atrial fibrillation. Currently in sinus rhythm. Anticoagulated with Xarelto. CHADS score 2. 6. Leukocytosis. Approaching normal limits. BC have been negative.  7. Adrenal insufficiency. Chronically on prednisone. During initial presentation, patient was started on stress dose of hydrocortisone, but has been improved and has been transitioned back to chronic dosing. 8. Hypokalemia. Will replace. 9. Hyponatremia. Resolved. 10. Severe malnutrition.  11. Morbid obesity.    Overall  improving. Continue current management.  Continue Lasix per nephrology.  Replete potassium and Repeat BMP in AM  DVT prophylaxis: xarelto  Code Status: Full Family Communication: Family bedside  Disposition Plan: Discharge once improved, hopefully 2-3 days.   Murray Hodgkins, MD  [facility] Direct contact: [phone] --Via amion app OR  --[email or web address]; password [email or web address]  7PM-7AM contact night coverage as above 11/13/2015, 6:49 AM  LOS: 14 days   Consultants:   GI  Nutrition   General surgery  Nephrology  PT  Procedures:   None   Antimicrobials:   Vanc 7/23 >>  Flagyl7/19 >>7/30  HPI/Subjective: Reports nausea, but feels well otherwise. Breathing well. Reports pain in sides of abd. Diarrhea has improved, has only had 1 episode thus far today.   Objective: Vitals:   11/12/15 0958 11/12/15 1300 11/12/15 1947 11/12/15 2354  BP: (!) 125/95 108/79  128/77  Pulse: 97 100  100  Resp:  20  20  Temp:  98.5 F (36.9 C)  98.2 F (36.8 C)  TempSrc:  Oral  Oral  SpO2:  100% 97% 98%  Weight:      Height:        Intake/Output Summary (Last 24 hours) at 11/13/15 0649 Last data filed at 11/13/15 0037  Gross per 24 hour  Intake              530 ml  Output             2800 ml  Net            -2270 ml     Filed Weights   11/10/15 1449 11/11/15 0613 11/12/15 0809  Weight: 135.2 kg (298 lb) 135 kg (297 lb 9.9 oz) (!) 136.3 kg (300 lb 6.4 oz)    Exam:    Constitutional:  . Appears calm and  comfortable Respiratory:  . CTA bilaterally, no w/r/r.  . Respiratory effort normal. No retractions or accessory muscle use Cardiovascular:  . RRR, no m/r/g . 4+ edema  Bilateral LE, 2+ upper edema bilaterall   Abdomen:  . Abdomen appears normal; mild generalized tenderness Musculoskeletal:  o Moves all extremities Psychiatric:  . judgement and insight appear normal . Mental status o Mood, affect appropriate  I have personally reviewed following labs  and imaging studies:  Sodium 135  Potassium 2.7   Cr 1.59, BUN 28. Improving.   WBC 11.8  Hgb 11.3  Blood sugars stable.  Scheduled Meds: . budesonide  0.5 mg Nebulization BID  . citalopram  20 mg Oral QHS  . DULoxetine  30 mg Oral Daily  . feeding supplement  1 Container Oral TID BM  . feeding supplement (PRO-STAT SUGAR FREE 64)  30 mL Oral BID  . furosemide  200 mg Intravenous BID  . gabapentin  600 mg Oral TID  . latanoprost  1 drop Both Eyes QHS  . loratadine  10 mg Oral Daily  . metolazone  5 mg Oral BID  . montelukast  10 mg Oral QHS  . nystatin  5 mL Oral QID  . potassium chloride  10 mEq Intravenous Q1 Hr x 3  . potassium chloride  40 mEq Oral BID  . predniSONE  15 mg Oral Q breakfast  . promethazine  12.5 mg Oral TID AC & HS  . rivaroxaban  20 mg Oral Q supper  . saccharomyces boulardii  250 mg Oral BID  . sodium chloride flush  10-40 mL Intracatheter Q12H  . tiotropium  1 capsule Inhalation Daily  . vancomycin  500 mg Oral Q6H   Continuous Infusions:   Principal Problem:   Pancolitis (HCC) Active Problems:   Hypokalemia   Essential hypertension   Severe persistent asthma   Morbid obesity (HCC)   C. difficile diarrhea   Abdominal pain in female   Hypotension   History of atrial fibrillation- NSR now   History of adrenal insufficiency   History of hypertension   C. difficile colitis   Metabolic acidosis   Hypocalcemia   Acute kidney injury ([place])   Hyponatremia   Sepsis ([place])   AKI (acute kidney injury) ([place])   Pyrexia   Protein-calorie malnutrition, severe   LOS: 14 days   Time spent 25 minutes  By signing my name below, I, Delene Ruffini, attest that this documentation has been prepared under the direction and in the presence of Ronny Ruddell P. Sarajane Jews, MD. Electronically Signed: Delene Ruffini, Scribe.  11/13/15 12:35pm    I personally performed the services described in this documentation. All medical record entries made by the scribe  were at my direction. I have reviewed the chart and agree that the record reflects my personal performance and is accurate and complete. Murray Hodgkins, MD

## 2015-11-13 NOTE — Progress Notes (Signed)
Deborah Murray  MRN: [id number]  DOB/AGE: [birth year] 55 y.o.  Primary Care Physician:[name], [name], MD  Admit date: 10/30/2015  Chief Complaint:  Chief Complaint  Patient presents with  . Abdominal Pain    S-Pt presented on  10/30/2015 with  Chief Complaint  Patient presents with  . Abdominal Pain  .    Pt today feels better.Pt voices no new concerns  Meds . budesonide  0.5 mg Nebulization BID  . citalopram  20 mg Oral QHS  . DULoxetine  30 mg Oral Daily  . feeding supplement  1 Container Oral TID BM  . feeding supplement (PRO-STAT SUGAR FREE 64)  30 mL Oral BID  . furosemide  200 mg Intravenous BID  . gabapentin  600 mg Oral TID  . latanoprost  1 drop Both Eyes QHS  . loratadine  10 mg Oral Daily  . metolazone  5 mg Oral BID  . montelukast  10 mg Oral QHS  . nystatin  5 mL Oral QID  . potassium chloride  10 mEq Intravenous Q1 Hr x 3  . potassium chloride  40 mEq Oral BID  . predniSONE  15 mg Oral Q breakfast  . promethazine  12.5 mg Oral TID AC & HS  . rivaroxaban  20 mg Oral Q supper  . saccharomyces boulardii  250 mg Oral BID  . sodium chloride flush  10-40 mL Intracatheter Q12H  . tiotropium  1 capsule Inhalation Daily  . vancomycin  500 mg Oral Q6H      Physical Exam: Vital signs in last 24 hours: Temp:  [98.2 F (36.8 C)-98.5 F (36.9 C)] 98.2 F (36.8 C) (08/02 0658) Pulse Rate:  [90-100] 90 (08/02 0658) Resp:  [20] 20 (08/02 0658) BP: (97-128)/(77-95) 97/81 (08/02 0658) SpO2:  [97 %-100 %] 98 % (08/02 0658) Weight change:  Last BM Date: 11/12/15  Intake/Output from previous day: 08/01 0701 - 08/02 0700 In: 530 [P.O.:480; I.V.:50] Out: 3500 [Urine:3500] No intake/output data recorded.   Physical Exam: General- pt is awake,alert, oriented to time place and person Resp- No acute REsp distress, decreased at bases. CVS- S1S2 regular in rate and rhythm GIT- BS+, soft, NT, ND EXT- 1+ LE Edema,No Cyanosis   Lab Results: CBC  Recent  Labs  11/12/15 0458 11/13/15 0509  WBC 15.0* 11.8*  HGB 12.0 11.3*  HCT 35.4* 33.8*  PLT 318 334   WBC trend 32=>24=>18  BMET  Recent Labs  11/12/15 0458 11/13/15 0509  NA 135 135  K 3.9 2.7*  CL 103 100*  CO2 25 25  GLUCOSE 87 81  BUN 30* 28*  CREATININE 1.70* 1.59*  CALCIUM 8.2* 8.2*   Creat trend 2017  1.3=>2.65=>1.83=>1.6==>1.7=>1.59 2016  1.2 2014  1.4 2013   1.3    MICRO Recent Results (from the past 240 hour(s))  MRSA PCR Screening     Status: None   Collection Time: 11/03/15 12:10 PM  Result Value Ref Range Status   MRSA by PCR NEGATIVE NEGATIVE Final    Comment:        The GeneXpert MRSA Assay (FDA approved for NASAL specimens only), is one component of a comprehensive MRSA colonization surveillance program. It is not intended to diagnose MRSA infection nor to guide or monitor treatment for MRSA infections.   Culture, blood (routine x 2)     Status: None (Preliminary result)   Collection Time: 11/08/15 10:30 AM  Result Value Ref Range Status   Specimen Description RIGHT ANTECUBITAL  Final   Special Requests BOTTLES DRAWN AEROBIC AND ANAEROBIC 4CC EACH  Final   Culture NO GROWTH 4 DAYS  Final   Report Status PENDING  Incomplete  Culture, blood (routine x 2)     Status: None (Preliminary result)   Collection Time: 11/08/15 10:45 AM  Result Value Ref Range Status   Specimen Description BLOOD PICC LINE DRAW  Final   Special Requests BOTTLES DRAWN AEROBIC AND ANAEROBIC 6CC EACH  Final   Culture NO GROWTH 4 DAYS  Final   Report Status PENDING  Incomplete      Lab Results  Component Value Date   CALCIUM 8.2 (L) 11/13/2015   CAION 1.14 10/24/2011   PHOS 4.0 11/12/2015   Alb 2.5 Corrected Calcium 8.2+ 1.4=9.6            Impression: 1)Renal  AKI secondary to ATN                AKI sec to Hypovolemia/HYpotension/ ACE as outpt/ HCTZ as outpt                AKI on CKD               CKD stage 3.               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      Tannis Burstein S 11/13/2015, 9:06 AM

## 2015-11-13 NOTE — Plan of Care (Addendum)
PICC line is hanging by a thread and pretty much out.  [facility] Services contacted to assist to fix or re-place a new line.  Stated they could be here to assess situation about 1600. Confirmed with Dr. that patient DOES still need a PICC line in place for current treatments.

## 2015-11-14 DIAGNOSIS — R109 Unspecified abdominal pain: Secondary | ICD-10-CM

## 2015-11-14 DIAGNOSIS — R1084 Generalized abdominal pain: Secondary | ICD-10-CM

## 2015-11-14 LAB — BASIC METABOLIC PANEL
Anion gap: 6 (ref 5–15)
BUN: 25 mg/dL — ABNORMAL HIGH (ref 6–20)
CHLORIDE: 100 mmol/L — AB (ref 101–111)
CO2: 26 mmol/L (ref 22–32)
CREATININE: 1.61 mg/dL — AB (ref 0.44–1.00)
Calcium: 7.6 mg/dL — ABNORMAL LOW (ref 8.9–10.3)
GFR calc non Af Amer: 34 mL/min — ABNORMAL LOW (ref >60)
GFR, EST AFRICAN AMERICAN: 40 mL/min — AB (ref >60)
Glucose, Bld: 102 mg/dL — ABNORMAL HIGH (ref 65–99)
POTASSIUM: 2.4 mmol/L — AB (ref 3.5–5.1)
SODIUM: 132 mmol/L — AB (ref 135–145)

## 2015-11-14 MED ORDER — HYDROMORPHONE HCL 2 MG PO TABS
2.0000 mg | ORAL_TABLET | ORAL | Status: DC | PRN
  Administered 2015-11-14 – 2015-11-19 (×12): 2 mg via ORAL
  Filled 2015-11-14 (×12): qty 1

## 2015-11-14 MED ORDER — VANCOMYCIN 50 MG/ML ORAL SOLUTION: 125.0000 mg | Freq: Four times a day (QID) | ORAL | Status: DC

## 2015-11-14 MED ORDER — POTASSIUM CHLORIDE CRYS ER 20 MEQ PO TBCR
40.0000 meq | EXTENDED_RELEASE_TABLET | Freq: Three times a day (TID) | ORAL | Status: DC
  Administered 2015-11-14 (×3): 40 meq via ORAL
  Filled 2015-11-14 (×3): qty 2

## 2015-11-14 MED ORDER — VANCOMYCIN 50 MG/ML ORAL SOLUTION
250.0000 mg | Freq: Four times a day (QID) | ORAL | Status: DC
  Administered 2015-11-15 – 2015-11-19 (×19): 250 mg via ORAL
  Filled 2015-11-14 (×23): qty 5

## 2015-11-14 NOTE — Progress Notes (Signed)
Subjective: Interval History: Her diarrhea is improving. Presently she denies any nausea or vomiting. No abdominal pain  Objective: Vital signs in isn't requiring me I try always a st 24 hours: Temp:  [98 F (36.7 C)-98.2 F (36.8 C)] 98 F (36.7 C) (08/03 0418) Pulse Rate:  [78-89] 78 (08/03 0418) Resp:  [20] 20 (08/03 0418) BP: (103-110)/(68-84) 108/68 (08/03 0418) SpO2:  [93 %-99 %] 93 % (08/03 0800) Weight:  [135.8 kg (299 lb 6.9 oz)] 135.8 kg (299 lb 6.9 oz) (08/03 0418) Weight change: -0.441 kg (-15.5 oz)  Intake/Output from previous day: 08/02 0701 - 08/03 0700 In: 2570 [P.O.:720] Out: -  Intake/Output this shift: No intake/output data recorded.  Generally patient is alert and in no apparent distress Chest is clear to auscultation Heart exam revealed regular rate and rhythm no murmur Abdomen: Somewhat distended, mildly tender, no rebound tenderness and positive bowel sound Extremities 2-+ edema  Lab Results:  Recent Labs  11/12/15 0458 11/13/15 0509  WBC 15.0* 11.8*  HGB 12.0 11.3*  HCT 35.4* 33.8*  PLT 318 334   BMET:   Recent Labs  11/12/15 0458 11/13/15 0509  NA 135 135  K 3.9 2.7*  CL 103 100*  CO2 25 25  GLUCOSE 87 81  BUN 30* 28*  CREATININE 1.70* 1.59*  CALCIUM 8.2* 8.2*   No results for input(s): PTH in the last 72 hours. Iron Studies: No results for input(s): IRON, TIBC, TRANSFERRIN, FERRITIN in the last 72 hours.  Studies/Results: No results found.  I have reviewed the patient's current medications.  Assessment/Plan: Problem #1 acute kidney injury: Her Renal function is improving. Presently patient is asymptomatic. Problem #2 . Pancolitis: Her diarrhea is improving. Presently being followed by GI. Problem #3 history of asthma: She is on inhalers. Patient does not have any complaints.  Problem #4 low CO2: Respiratory alkalosis. Her CO2 is stable. Problem #5 hypophosphatemia: Her phosphorus is normal Problem #6 hypokalemia: Her  potassium has declined. This could be secondary to diuretics. Problem #7 hypocalcemia on calcium supplement. Her calcium is normal. Problem #8 anasarca: Patient presently on Lasix and metolazone combination. Her urine output is good and her leg edema seems to be improving. Problem #9 hypotension: Her blood pressure is in normal range Plan: 1] we'll continue with diuretics 2] we will give her KCl 10 mEq over 1 hour IV 3 doses 3] we'll start her on KCl 40 mEq by mouth 3 times a day 4] we'll check her renal panel in the morning.   LOS: 15 days   Charisa Twitty S 11/14/2015,9:06 AM  [email or web address]

## 2015-11-14 NOTE — Progress Notes (Signed)
Nutrition Follow-up  DOCUMENTATION CODES:  Severe malnutrition in context of acute illness/injury, Morbid obesity  INTERVENTION:  Worked with pt to come up with meal plan. Requests are to be passed to dietary.   Continue 30 mL Prostat BID, each supplement provides 100 kcal and 15 grams of protein.  Can continue, Boost Breeze po TID, each supplement provides 250 kcal and 9 grams of protein, but most often refuses  NUTRITION DIAGNOSIS:  Inadequate oral intake related to nausea, acute illness and diarrhea evidenced by an estimated energy intake that met < or equal to 50% of needs for > or equal to 5 days  ongoing  GOAL:  Patient will meet greater than or equal to 90% of their needs   -not met  MONITOR:  PO intake, Supplement acceptance, Diet advancement, Labs, Weight trends, I & O's   ASSESSMENT:  55 y/o female history obesity, DJD, allergies, HTN, HLD, chronic pain, afib, recent back surgery. Presents with 2 week history of abdominal pain and diarrhea which began after recent back surgery. CT in ED showed diffuse pancolitis. Admitted for management. Work up is positive for CDIFF  Interval history as of 7/25: Pt's diet advanced to [facility] on 7/25. She became acidotic, UTI, aki.  Transferred to ICU   Interval history as of 7/27: Diet advanced to soft on 7/25, had ~1 days of fair-good meal intake on 7/25. Patient moved to [facility]. Stool output decreased. Feels better overall  Interval history as of 8/3: Diet advanced to [facility], lactose free on 7/31. PO intake is still poor with only consuming >50% of 2 meals for last week and majority of meal intake </=25%. Weight has continued to increase, anasarca. Diarrhea much improved.    Pt seen today at Lunch time, sitting in chair with untouched lunch tray it front of her. A couple processed fried pickles are also sitting on her table. RD asked why she is not hungry. Her friend in the room states "shes hungry, she just doesn't like what you give her, she wants  fried chicken". Pt is told that fried foods are not available. She asked for Mac n Cheese, but this contains lactose which GI has advised against.   RD worked with pt to come up some meals she would except. She does not like any of the breakfast foods. She requested cold cereal. Can use lactaid milk. She asked for a BLT, hamburger/hodog, or baked potato. She notes how her diet restrictions have also prevented her from receiving some items she wants such as butter for her toast. She also requested ice cream, but this contains lactose. She really likes fruit  Overall she does feel better, but she is still having ~ 2 episodes of diarrhea per day. She noted eating pasta yesterday, but this caused immediate diarrhea. She has some flank pain.   Labs reviewed: Hypokalemic( on lasix ), Recnt CBGs are much lower, likely poor intake playing role, in addition to decreased stress+lower steroid dose/taper  Meds: Lasix, phenergan before meals, Vanc, Probiotic. prednisone   Recent Labs Lab 11/10/15 0619 11/11/15 0631 11/12/15 0458 11/13/15 0509 11/14/15 0920  NA 132* 135 135 135 132*  K 3.4* 3.9 3.9 2.7* 2.4*  CL 103 101 103 100* 100*  CO2 _0 BUN 27* 29* 30* 28* 25*  CREATININE 1.57* 1.66* 1.70* 1.59* 1.61*  CALCIUM 7.6* 8.2* 8.2* 8.2* 7.6*  PHOS 3.3 3.6 4.0  --   --   GLUCOSE 104* 92 87 81 102*  Diet Order:  Diet Heart Room service appropriate? Yes; Fluid consistency: Thin  Skin: MSAD perineum, serous Blister to thigh, Dehisced/open back wound,  Dehisced/open wound to L buttocks,   Last BM:  8/2-loose  Height:  Ht Readings from Last 1 Encounters:  10/30/15 _0  (1.575 m)   Weight:  Wt Readings from Last 1 Encounters:  11/14/15 299 lb 6.9 oz (135.8 kg)   Wt Readings from Last 10 Encounters:  11/14/15 299 lb 6.9 oz (135.8 kg)  09/17/15 247 lb (112 kg)  08/28/15 247 lb 12.8 oz (112.4 kg)  08/26/15 240 lb (108.9 kg)  06/21/15 253 lb (114.8 kg)  06/07/15 236 lb (107 kg)   10/30/14 260 lb (117.9 kg)  09/11/14 262 lb 5.6 oz (119 kg)  05/13/14 257 lb 9.6 oz (116.8 kg)  11/26/12 235 lb (106.6 kg)  Dosing weight: 246 lbs-admit weight  Ideal Body Weight:  50 kg  BMI:  Body mass index is 54.77 kg/m.  Estimated Nutritional Needs:  Kcal:  1550-1750 kcals  Protein:  65-75 g Pro (1.3-1.5 g/kg IBW) Fluid:  >1.6 liters fluid + enough to replace stool losses  EDUCATION NEEDS:  No education needs identified at this time  Burtis Junes RD, LDN, [place] [facility] Pager: [phone] 11/14/2015 11:50 AM

## 2015-11-14 NOTE — Progress Notes (Signed)
Physical Therapy Treatment Patient Details Name: Deborah Murray MRN: [id number] DOB: [birth year] Today's Date: 11/14/2015    History of Present Illness 55 yo F admitted 10/30/2015 due to persistent abdominal pain and diarrhea.  She underwent back surgery 2 weeks ago at [facility].  Dx: pancolitis due to C-diff.  PMH: obesity, DJD, severe asthma, allergies, HTN, chronic back pain, Afib, and recent back surgery, syncope and collapse, thyroid disease, vocal cord dysfunction, lumbar radiculopathy, adrenal abnormality, COPD, gastric bypass, cholecystectomy, R knee surgery.    PT Comments    Pt received in bed, and was agreeable to PT tx, however expressed she was having intense pain in R side.  Pt continues to require Mod A for bed mobility and Mod A for sit<>stand.  She was able to transfer bed<>BSC with RW and Min A, and then BSC<>chair with RW and Min A.  Further gait or mobility training is limited today due to fatigue, as well as pain level.  Pt needs SNF to continue to progress her strength and functional mobility.    Follow Up Recommendations  SNF     Equipment Recommendations  None recommended by PT    Recommendations for Other Services       Precautions / Restrictions Precautions Precautions: Back;Other (comment) Precaution Booklet Issued: No Precaution Comments: Recent spinal fusion 2 weeks prior to admission.  Restrictions Weight Bearing Restrictions: No    Mobility  Bed Mobility Overal bed mobility: Needs Assistance Bed Mobility: Supine to Sit;Sit to Sidelying     Supine to sit: Mod assist;HOB elevated (Bed deflated )        Transfers Overall transfer level: Needs assistance Equipment used: Rolling walker (2 wheeled) Transfers: Sit to/from [facility] Sit to Stand: Mod assist;From elevated surface (Rock back and forth and count to 3 to gain momentum to power up. Pt needed several vc's to keep hands down to push instead of pulling on the RW. ) Stand pivot  transfers: Min assist          Ambulation/Gait                 Stairs            Wheelchair Mobility    Modified Rankin (Stroke Patients Only)       Balance Overall balance assessment: Needs assistance         Standing balance support: Bilateral upper extremity supported Standing balance-Leahy Scale: Fair                      Cognition Arousal/Alertness: Awake/alert Behavior During Therapy: WFL for tasks assessed/performed Overall Cognitive Status: Within Functional Limits for tasks assessed                      Exercises      General Comments        Pertinent Vitals/Pain Pain Score: 10-Worst pain ever Pain Location: R side Pain Descriptors / Indicators: Sharp Pain Intervention(s): RN gave pain meds during session    Home Living                      Prior Function            PT Goals (current goals can now be found in the care plan section) Acute Rehab PT Goals Patient Stated Goal: To go home PT Goal Formulation: With patient/family Time For Goal Achievement: 11/22/15 Potential to Achieve Goals: Good Progress towards PT goals: Progressing  toward goals    Frequency  Min 5X/week    PT Plan Current plan remains appropriate    Co-evaluation             End of Session Equipment Utilized During Treatment: Gait belt Activity Tolerance: Patient limited by fatigue Patient left: in chair;with call bell/phone within reach;with nursing/sitter in room     Time: [id number] PT Time Calculation (min) (ACUTE ONLY): 30 min  Charges:  $Therapeutic Activity: 23-37 mins                    G Codes:      Beth Aala Ransom, PT, DPT X: [phone]

## 2015-11-14 NOTE — Progress Notes (Signed)
PROGRESS NOTE  Deborah Deborah [id number] DOB: 12/03/1960 DOA: 10/30/2015 PCP: Deborah Passer, MD  Brief Narrative: 55 year old woman with a hx of afib HTN, adrenal insufficiency and recent back surgery presented with complaints of abdominal pain and diarrhea. CT a/p revealed diffused edematous wall thickening compatible with pancolitis. C diff positive. Follow up CT revealed worsening pancolitis. Surgery and GI were consulted. Conservative management was pursued. Additionally, nephrology was consulted for acute kidney injury and anasarca. Nephrology following. Since patient has been hospitalized for some time, PT has evaluated the patient and recommends SNF upon discharge. She has been slow to progress, but can likely be discharged in 1-2 days.   Assessment/Plan: 1. Pancolitis secondary to C Diff colitis. Slowly improving. Tolerating diet. Bowel movement frequency has decreased.   2. Essential HTN. Initially hypotensive on admission, but this was resolved with IV fluids and neo-synephrine.  3. AKI. IV fluids have been discontinued due to development of volume overload/anasarca. Nephrology has been consulted and is adjusting Lasix. 4. Anasarca. Likely related to initial aggressive hydration in the setting of hypoalbuminemia.  5. Atrial fibrillation. Currently in sinus rhythm. Anticoagulated with Xarelto. CHADS score 2. 6. Leukocytosis. Approaching normal limits. BC have been negative.  7. Adrenal insufficiency. Chronically on prednisone. During initial presentation, patient was started on stress dose of hydrocortisone, but has been improved and has been transitioned back to chronic dosing. 8. Hypokalemia. Replaced but remains persistently low. Secondary GI losses.  9. Hyponatremia. Resolved. 10. Severe malnutrition.  11. Morbid obesity.    Overall improving. Continue current management for diarrhea with vancomycin.  Replace potassium, check magnesium in the morning. Repeat BMP in the  am.  Continue Lasix per nephrology and monitor renal function.  DVT prophylaxis: xarelto  Code Status: Full Family Communication: Family bedside. Will discuss with daughter, Deborah Deborah [phone])  Disposition Plan: Discharge once improved, hopefully 1-2 days.   Deborah Hodgkins, MD  [facility] Direct contact: [phone] --Via amion app OR  --[email or web address]; password [email or web address]  7PM-7AM contact night coverage as above 11/14/2015, 6:24 AM  LOS: 15 days   Consultants:   GI  Nutrition   General surgery  Nephrology  PT  Procedures:   None   Antimicrobials:   Vanc 7/23 >>  Flagyl7/19 >>7/30  HPI/Subjective: Complains of cramping pain in right side abdominal. She has not eaten much. Reports nausea. Reports continued watery diarrhea, 4 episodes yesterday, 2 so far today. Breathing well.  Objective: Vitals:   11/13/15 1404 11/13/15 1947 11/13/15 2218 11/14/15 0418  BP: 103/84  110/72 108/68  Pulse: 89  87 78  Resp: 20  20 20   Temp: 98.2 F (36.8 C)  98.2 F (36.8 C) 98 F (36.7 C)  TempSrc:   Oral Oral  SpO2: 97% 99% 97% 95%  Weight:    135.8 kg (299 lb 6.9 oz)  Height:        Intake/Output Summary (Last 24 hours) at 11/14/15 0624 Last data filed at 11/14/15 0419  Gross per 24 hour  Intake             2570 ml  Output              700 ml  Net             1870 ml     Filed Weights   11/11/15 0613 11/12/15 0809 11/14/15 0418  Weight: 135 kg (297 lb 9.9 oz) (!) 136.3 kg (300 lb 6.4 oz) 135.8 kg (299 lb  6.9 oz)    Exam:  Constitutional:  . Appears calm and comfortable Respiratory:  . CTA bilaterally, no w/r/r.  . Respiratory effort normal. No retractions or accessory muscle use Cardiovascular:  . RRR, no m/r/g . 4+Bilateral LE extremity edema   Abdomen:  . Abdomen appears normal; no tenderness or masses . Positive bowel sounds Skin:  . Skin of RLQ unremarkable.Right-sided pannus is nontender. Psychiatric:  . judgement and insight  appear normal . Mental status o Mood, affect appropriate  I have personally reviewed following labs and imaging studies:  Potassium 2.4  BUN 25, Cr 1.61, stable  Glucose 102  Scheduled Meds: . budesonide  0.5 mg Nebulization BID  . citalopram  20 mg Oral QHS  . DULoxetine  30 mg Oral Daily  . feeding supplement  1 Container Oral TID BM  . feeding supplement (PRO-STAT SUGAR FREE 64)  30 mL Oral BID  . furosemide  200 mg Intravenous BID  . gabapentin  600 mg Oral TID  . latanoprost  1 drop Both Eyes QHS  . loratadine  10 mg Oral Daily  . metolazone  5 mg Oral BID  . montelukast  10 mg Oral QHS  . nystatin  5 mL Oral QID  . potassium chloride  40 mEq Oral BID  . predniSONE  15 mg Oral Q breakfast  . promethazine  12.5 mg Oral TID AC & HS  . rivaroxaban  20 mg Oral Q supper  . saccharomyces boulardii  250 mg Oral BID  . sodium chloride flush  10-40 mL Intracatheter Q12H  . tiotropium  1 capsule Inhalation Daily  . vancomycin  500 mg Oral Q6H   Continuous Infusions:   Principal Problem:   Pancolitis (HCC) Active Problems:   Hypokalemia   Essential hypertension   Severe persistent asthma   Morbid obesity (HCC)   C. difficile diarrhea   Abdominal pain in female   Hypotension   History of atrial fibrillation- NSR now   History of adrenal insufficiency   History of hypertension   C. difficile colitis   Metabolic acidosis   Hypocalcemia   Acute kidney injury ([place])   Hyponatremia   Sepsis ([place])   AKI (acute kidney injury) ([place])   Pyrexia   Protein-calorie malnutrition, severe   LOS: 15 days   Time spent 25 minutes  By signing my name below, I, Delene Ruffini, attest that this documentation has been prepared under the direction and in the presence of Somaya Grassi P. Sarajane Jews, MD. Electronically Signed: Delene Ruffini, Scribe.  11/14/15 11:15am   I personally performed the services described in this documentation. All medical record entries made by the scribe  were at my direction. I have reviewed the chart and agree that the record reflects my personal performance and is accurate and complete. Deborah Hodgkins, MD

## 2015-11-14 NOTE — Progress Notes (Signed)
Subjective: Feeling much better. One bowel movement last night, one this morning. Formed/soft, no hematochezia or melena. Generalized abdominal pain significantly improved. Some right-sided pain this morning with movement, pain medication helps. No more nausea or vomiting. Feels weak and deconditioned. No other upper or lower GI complaints.  Objective: Vital signs in last 24 hours: Temp:  [98 F (36.7 C)-98.2 F (36.8 C)] 98 F (36.7 C) (08/03 0418) Pulse Rate:  [78-89] 78 (08/03 0418) Resp:  [20] 20 (08/03 0418) BP: (103-110)/(68-84) 108/68 (08/03 0418) SpO2:  [93 %-99 %] 93 % (08/03 0800) Weight:  [299 lb 6.9 oz (135.8 kg)] 299 lb 6.9 oz (135.8 kg) (08/03 0418) Last BM Date: 11/13/15 General:   Morbidly obese female, alert and oriented, pleasant Head:  Normocephalic and atraumatic. Eyes:  No icterus, sclera clear. Conjuctiva pink.  Heart:  S1, S2 present, no murmurs noted.  Lungs: Clear to auscultation bilaterally, without wheezing, rales, or rhonchi.  Abdomen:  Bowel sounds present, obese but soft, non-distended. Mild generalized TTP, some increased right-side/RLQ pain with palpation. No HSM or hernias noted. No rebound or guarding. No masses appreciated  Extremities:  Continued bilateral LE edema noted, 1-2+ pitting, pitting extends to bilateral knees. Neurologic:  Alert and  oriented x4;  grossly normal neurologically. Skin:  Warm and dry, intact without significant lesions.  Psych:  Alert and cooperative. Normal mood and affect.  Intake/Output from previous day: 08/02 0701 - 08/03 0700 In: 2570 [P.O.:720] Out: -  Intake/Output this shift: No intake/output data recorded.  Lab Results:  Recent Labs  11/12/15 0458 11/13/15 0509  WBC 15.0* 11.8*  HGB 12.0 11.3*  HCT 35.4* 33.8*  PLT 318 334   BMET  Recent Labs  11/12/15 0458 11/13/15 0509  NA 135 135  K 3.9 2.7*  CL 103 100*  CO2 25 25  GLUCOSE 87 81  BUN 30* 28*  CREATININE 1.70* 1.59*  CALCIUM 8.2*  8.2*   LFT  Recent Labs  11/12/15 0458  PROT 4.6*  ALBUMIN 2.5*  2.5*  AST 20  ALT 9*  ALKPHOS 51  BILITOT 0.6  BILIDIR 0.1  IBILI 0.5   PT/INR No results for input(s): LABPROT, INR in the last 72 hours. Hepatitis Panel No results for input(s): HEPBSAG, HCVAB, HEPAIGM, HEPBIGM in the last 72 hours.   Studies/Results: No results found.  Assessment: 55 year old female admitted with severe/complicatedCdiff colitis and clinically improving. Leukocytosis greatly improved from admission, with persisting mild leukocytosis likely multifactorial in setting of steroids. Off Flagyl now, will need to continue Vanc. Taper Vanc over 4 weeks on discharge.  Abdominal pain: likely musculoskeletal in nature due to worsening with movement and deconditioned status after critical illness. Continue to monitor for changes or worsening, pain treatment per hospitalist  Anasarca: Continued edema, improved from previous. Likely multifactorial in setting of significant illness and limited mobility. Would likely benefit from rehab. Continue to monitor  AKI: Improving/stable with Cr today 1.61. Nephrology following.  N/V: Significantly improved, wants to eat, appetite improving. On heart-healthy diet, lactose free.  Adrenal insufficiency: on prednisone at a lower dose then last week with improvement in WBC count noted. Continue to monitor   Plan: 1. Continue to follow labs 2. Monitor for worsening pain/nausea or change in stool consistency 3. Pain management per hospitalist 4. Supportive measures 5. Off contact isolation due to 14 days Vanco 6. Start 30 day Vancomycin taper tomorrow: 250 mg four times a day x 14 days, 125 mg four times a day for  14 days. 7. Monitor for changes in stool consistency.   Walden Field, AGNP-C Adult & Gerontological Nurse Practitioner [facility]    LOS: 15 days    11/14/2015, 8:51 AM

## 2015-11-15 DIAGNOSIS — K529 Noninfective gastroenteritis and colitis, unspecified: Secondary | ICD-10-CM

## 2015-11-15 LAB — RENAL FUNCTION PANEL
ALBUMIN: 2.4 g/dL — AB (ref 3.5–5.0)
ANION GAP: 7 (ref 5–15)
BUN: 22 mg/dL — ABNORMAL HIGH (ref 6–20)
CALCIUM: 7.9 mg/dL — AB (ref 8.9–10.3)
CO2: 27 mmol/L (ref 22–32)
CREATININE: 1.42 mg/dL — AB (ref 0.44–1.00)
Chloride: 102 mmol/L (ref 101–111)
GFR, EST AFRICAN AMERICAN: 47 mL/min — AB (ref >60)
GFR, EST NON AFRICAN AMERICAN: 40 mL/min — AB (ref >60)
Glucose, Bld: 105 mg/dL — ABNORMAL HIGH (ref 65–99)
PHOSPHORUS: 4.7 mg/dL — AB (ref 2.5–4.6)
Potassium: 2.4 mmol/L — CL (ref 3.5–5.1)
SODIUM: 136 mmol/L (ref 135–145)

## 2015-11-15 LAB — BASIC METABOLIC PANEL
ANION GAP: 8 (ref 5–15)
BUN: 22 mg/dL — AB (ref 6–20)
CHLORIDE: 101 mmol/L (ref 101–111)
CO2: 27 mmol/L (ref 22–32)
Calcium: 8 mg/dL — ABNORMAL LOW (ref 8.9–10.3)
Creatinine, Ser: 1.41 mg/dL — ABNORMAL HIGH (ref 0.44–1.00)
GFR, EST AFRICAN AMERICAN: 47 mL/min — AB (ref >60)
GFR, EST NON AFRICAN AMERICAN: 40 mL/min — AB (ref >60)
Glucose, Bld: 104 mg/dL — ABNORMAL HIGH (ref 65–99)
POTASSIUM: 2.4 mmol/L — AB (ref 3.5–5.1)
SODIUM: 136 mmol/L (ref 135–145)

## 2015-11-15 LAB — MAGNESIUM: Magnesium: 1.9 mg/dL (ref 1.7–2.4)

## 2015-11-15 MED ORDER — POTASSIUM CHLORIDE CRYS ER 20 MEQ PO TBCR
40.0000 meq | EXTENDED_RELEASE_TABLET | ORAL | Status: AC
  Administered 2015-11-15 (×4): 40 meq via ORAL
  Filled 2015-11-15 (×4): qty 2

## 2015-11-15 MED ORDER — POTASSIUM CHLORIDE 10 MEQ/100ML IV SOLN
10.0000 meq | INTRAVENOUS | Status: AC
  Administered 2015-11-15 (×3): 10 meq via INTRAVENOUS
  Filled 2015-11-15 (×3): qty 100

## 2015-11-15 NOTE — Clinical Social Work Note (Addendum)
CSW updated [facility] on pt. Facility can accept pt over weekend if medically stable. CSW left voicemail yesterday to update Barnabas Lister, pt's daughter at pt's request. Barnabas Lister returned call today and d/c plan was discussed.   Benay Pike, [place]

## 2015-11-15 NOTE — Progress Notes (Signed)
Subjective: Interval History: Patient still has some diarrhea but the frequency seems to be improving. Presently her appetite is good and she doesn't have any nausea or vomiting. Her abdominal pain also has improved.  Objective: Vital signs in isn't requiring me I try always a st 24 hours: Temp:  [98.1 F (36.7 C)-98.8 F (37.1 C)] 98.4 F (36.9 C) (08/04 0520) Pulse Rate:  [87-90] 87 (08/04 0520) Resp:  [20-21] 20 (08/04 0520) BP: (99-128)/(78-83) 128/82 (08/04 0520) SpO2:  [97 %-100 %] 98 % (08/04 0520) Weight:  [140 kg (308 lb 10.3 oz)] 140 kg (308 lb 10.3 oz) (08/04 0520) Weight change: 4.18 kg (9 lb 3.4 oz)  Intake/Output from previous day: 08/03 0701 - 08/04 0700 In: 950 [P.O.:600; IV Piggyback:350] Out: 4100 [Urine:4100] Intake/Output this shift: No intake/output data recorded.  Generally patient is alert and in no apparent distress Chest is clear to auscultation Heart exam revealed regular rate and rhythm no murmur Abdomen: Somewhat distended, mildly tender, no rebound tenderness and positive bowel sound Extremities 2-+ edema  Lab Results:  Recent Labs  11/13/15 0509  WBC 11.8*  HGB 11.3*  HCT 33.8*  PLT 334   BMET:   Recent Labs  11/14/15 0920 11/15/15 0639  NA 132* 136  136  K 2.4* 2.4*  2.4*  CL 100* 102  101  CO2 26 27  27   GLUCOSE 102* 105*  104*  BUN 25* 22*  22*  CREATININE 1.61* 1.42*  1.41*  CALCIUM 7.6* 7.9*  8.0*   No results for input(s): PTH in the last 72 hours. Iron Studies: No results for input(s): IRON, TIBC, TRANSFERRIN, FERRITIN in the last 72 hours.  Studies/Results: No results found.  I have reviewed the patient's current medications.  Assessment/Plan: Problem #1 acute kidney injury: Her Renal function continued to improve. Presently her creatinine has gone down to 1.42 in BUN is 22. Patient at this moment doesn't have any uremic sinus symptoms. Problem #2 . Pancolitis: Her diarrhea is improving. Presently being  followed by GI. Problem #3 history of asthma: She is on inhalers. No difficulty in breathing Problem #4 low CO2: Respiratory alkalosis.  Problem #5 hypophosphatemia: Her phosphorus is normal Problem #6 hypokalemia: Her potassium has remained low in spite of potassium supplement. This is due to diuretics. Problem #7 hypocalcemia on calcium supplement. Her calcium is normal. Problem #8 anasarca: Patient presently on Lasix and metolazone combination. She has 4100 mL of urine output but patient still has significant sign of fluid overload. Patient however remained asymptomatic. Problem #9 hypotension: Her blood pressure is in normal range Plan: 1] we'll continue with diuretics 2] we will give her KCl 10 mEq over 1 hour IV 3 doses 3] continue with oral potassium 4] we'll check her renal panel in the morning.   LOS: 16 days   Deborah Murray S 11/15/2015,8:08 AM  [email or web address]

## 2015-11-15 NOTE — Progress Notes (Signed)
Subjective: Continues to feel better today. Out of the bed on the bedside commode. Feels edema is improving. Still weak. Stools 1-2 a day, still formed. No bleeding noted. Feels optimistic. Some continued right-sided pain, no worse, well controlled with pain medication. No further GI symptoms.  Objective: Vital signs in last 24 hours: Temp:  [98.1 F (36.7 C)-98.8 F (37.1 C)] 98.4 F (36.9 C) (08/04 0520) Pulse Rate:  [87-90] 87 (08/04 0520) Resp:  [20-21] 20 (08/04 0520) BP: (99-128)/(78-83) 128/82 (08/04 0520) SpO2:  [97 %-100 %] 97 % (08/04 0805) Weight:  [308 lb 10.3 oz (140 kg)] 308 lb 10.3 oz (140 kg) (08/04 0520) Last BM Date: 11/14/15 General:   Alert and oriented, pleasant. Morbidly obese. Head:  Normocephalic and atraumatic. Eyes:  No icterus, sclera clear.Conjuctiva pink.  Heart:  S1, S2 present, no murmurs noted.  Lungs: Clear to auscultation bilaterally, without wheezing, rales, or rhonchi.  Abdomen:  Bowel sounds present, obese but soft, non-tender, non-distended. No HSM or hernias noted. No rebound or guarding. Extremities:  Continued bilateral LE edema. Neurologic:  Alert and  oriented x4;  grossly normal neurologically. Psych:  Alert and cooperative. Normal mood and affect.  Intake/Output from previous day: 08/03 0701 - 08/04 0700 In: 950 [P.O.:600; IV Piggyback:350] Out: 4100 [Urine:4100] Intake/Output this shift: Total I/O In: 380 [P.O.:360; I.V.:20] Out: -   Lab Results:  Recent Labs  11/13/15 0509  WBC 11.8*  HGB 11.3*  HCT 33.8*  PLT 334   BMET  Recent Labs  11/13/15 0509 11/14/15 0920 11/15/15 0639  NA 135 132* 136  136  K 2.7* 2.4* 2.4*  2.4*  CL 100* 100* 102  101  CO2 25 26 27  27   GLUCOSE 81 102* 105*  104*  BUN 28* 25* 22*  22*  CREATININE 1.59* 1.61* 1.42*  1.41*  CALCIUM 8.2* 7.6* 7.9*  8.0*   LFT  Recent Labs  11/15/15 0639  ALBUMIN 2.4*   PT/INR No results for input(s): LABPROT, INR in the last 72  hours. Hepatitis Panel No results for input(s): HEPBSAG, HCVAB, HEPAIGM, HEPBIGM in the last 72 hours.   Studies/Results: No results found.  Assessment: 55 year old female admitted with severe/complicatedCdiff colitis and clinically improving. Leukocytosis greatly improved from admission, with persisting mild leukocytosis likely multifactorial in setting of steroids. Off Flagyl now, will need to continue Vanc. Taper Vanc over 4 weeks which was started today.  Abdominal pain: likely musculoskeletal in nature due to worsening with movement and deconditioned status after critical illness. Continue to monitor for changes or worsening, pain treatment per hospitalist  Anasarca: Continued edema, improved from previous. Likely multifactorial in setting of significant illness and limited mobility. Would likely benefit from rehab. Continue to monitor  AKI: Improving/stable with Cr today 1.42. Nephrology following.  N/V: Appetite improving. On heart-healthy diet, lactose free.  Adrenal insufficiency: on prednisone at a lower dose then last week with improvement in WBC count noted. Continue to monitor  Clinically improved today. Seems hopeful and optimistic. Having a bowel movement during her visit with me today. Stools continue 1-2 a day, more formed. Feels edema is improving. Looking forward to getting her strength back. Doesn't like the protein supplements. Doing much better compared to previous weeks. Vanco taper started today: 250 mg qid x 15 days, 125 mg qid x 15 days (30 day taper): continue on discharge.   Plan: 1. Continue vancomycin taper 2. Monitor for worsening symptoms 3. Anticipate eventual discharge to rehab for deconditioning 4.  Follow kidney function, address vancomycin as needed in setting of renal insufficiency 5. Supportive measures.   Walden Field, AGNP-C Adult & Gerontological Nurse Practitioner [facility]    LOS: 16 days    11/15/2015,  2:33 PM

## 2015-11-15 NOTE — Care Management Important Message (Signed)
Important Message  Patient Details  Name: Deborah Murray MRN: [id number] Date of Birth: Jun 20, 1960   Medicare Important Message Given:  Yes    Jowanda Heeg, Chauncey Reading, RN 11/15/2015, 12:38 PM

## 2015-11-15 NOTE — Progress Notes (Signed)
PROGRESS NOTE  Deborah Murray [id number] DOB: [birth year] DOA: 10/30/2015 PCP: Sol Passer, MD  Brief Narrative: 55 year old woman with a hx of afib HTN, adrenal insufficiency and recent back surgery presented with complaints of abdominal pain and diarrhea. CT a/p revealed diffused edematous wall thickening compatible with pancolitis. C diff positive. Follow up CT revealed worsening pancolitis. Surgery and GI were consulted. Conservative management was pursued. Additionally, nephrology was consulted for acute kidney injury and anasarca. Nephrology following. Since patient has been hospitalized for some time, PT has evaluated the patient and recommends SNF upon discharge. She has been slow to progress, but can likely be discharged in 24 hours  Assessment/Plan: 1. Pancolitis secondary to C diff colitis. Status post sepsis with hypotension. Slowly improving. Less than 5 bowel movements per day. Remains afebrile. Appears to stabilize. 2. Anasarca secondary to aggressive hydration in the setting of sepsis. Slowly improving with aggressive diuresis. 3. Acute kidney injury secondary to sepsis. Resolved. 4. Atrial fibrillation. Stable. Remains in sinus rhythm. Continue anticoagulation with Xarelto. 5. Adrenal insufficiency. Treated with stress dose steroids during initial part of the illness but now stable on chronic therapy. 6. Hypokalemia. Secondary to aggressive diuresis. Asymptomatic. Telemetry unremarkable. 7. Hyponatremia.  8. Severe malnutrition.  9. Morbid obesity.    Continues to slowly improve, main issue at this point is massive volume overload which is responding to diuretics. In kidney management per nephrology.  Given severity of illness and ongoing 3-4 bowel movements per day, will taper vancomycin.  DVT prophylaxis: Xarelto  Code Status: Full Family Communication:  none present Disposition Plan: Discharge to [facility] likely 48 hours  Murray Hodgkins, MD  [facility] Direct contact: [phone] --Via [place]  --[email or web address]; password [email or web address]  7PM-7AM contact night coverage as above 11/15/2015, 6:28 AM  LOS: 16 days   Consultants:   GI  Nutrition   General surgery  Nephrology  PT  Procedures:   None   Antimicrobials:   Vanc 7/23 >>  Flagyl7/19 >>7/30  HPI/Subjective: Feeling okay today. Several bowel movements today, perhaps 4. Some right-sided abdominal pain with movement only. No nausea or vomiting.  Objective: Vitals:   11/14/15 0418 11/14/15 0800 11/14/15 1610 11/14/15 2100  BP: 108/68  99/78 121/83  Pulse: 78  89 90  Resp: 20  20 (!) 21  Temp: 98 F (36.7 C)  98.1 F (36.7 C) 98.8 F (37.1 C)  TempSrc: Oral   Oral  SpO2: 95% 93% 100% 97%  Weight: 135.8 kg (299 lb 6.9 oz)     Height:        Intake/Output Summary (Last 24 hours) at 11/15/15 0628 Last data filed at 11/14/15 2100  Gross per 24 hour  Intake              950 ml  Output             2500 ml  Net            -1550 ml     Filed Weights   11/11/15 0613 11/12/15 0809 11/14/15 0418  Weight: 135 kg (297 lb 9.9 oz) (!) 136.3 kg (300 lb 6.4 oz) 135.8 kg (299 lb 6.9 oz)    Exam:  Constitutional:  . Appears calm and comfortable Respiratory:  . CTA bilaterally, no w/r/r.  . Respiratory effort normal. No retractions or accessory muscle use Cardiovascular:  . RRR, no m/r/g . 3+ LE extremity edema   . Telemetry SR   I have personally reviewed  following labs and imaging studies:  Potassium 2.4, Mg 1.9 wnl  BUN 22, Cr 1.42, stable  Glucose 105  Scheduled Meds: . budesonide  0.5 mg Nebulization BID  . citalopram  20 mg Oral QHS  . DULoxetine  30 mg Oral Daily  . feeding supplement  1 Container Oral TID BM  . feeding supplement (PRO-STAT SUGAR FREE 64)  30 mL Oral BID  . furosemide  200 mg Intravenous BID  . gabapentin  600 mg Oral TID  . latanoprost  1 drop Both Eyes QHS  . loratadine  10 mg Oral Daily  . metolazone  5 mg  Oral BID  . montelukast  10 mg Oral QHS  . nystatin  5 mL Oral QID  . potassium chloride  40 mEq Oral TID  . predniSONE  15 mg Oral Q breakfast  . promethazine  12.5 mg Oral TID AC & HS  . rivaroxaban  20 mg Oral Q supper  . saccharomyces boulardii  250 mg Oral BID  . sodium chloride flush  10-40 mL Intracatheter Q12H  . tiotropium  1 capsule Inhalation Daily  . [START ON 11/30/2015] vancomycin  125 mg Oral Q6H  . vancomycin  250 mg Oral Q6H   Continuous Infusions:   Principal Problem:   Pancolitis (HCC) Active Problems:   Hypokalemia   Essential hypertension   Severe persistent asthma   Morbid obesity (HCC)   C. difficile diarrhea   Abdominal pain in female   Hypotension   History of atrial fibrillation- NSR now   History of adrenal insufficiency   History of hypertension   C. difficile colitis   Metabolic acidosis   Hypocalcemia   Acute kidney injury ([place])   Hyponatremia   Sepsis ([place])   AKI (acute kidney injury) ([place])   Pyrexia   Protein-calorie malnutrition, severe   Generalized abdominal pain   LOS: 16 days   Time spent 15 minutes  By signing my name below, I, Hilbert Odor, attest that this documentation has been prepared under the direction and in the presence of [place]. Sarajane Jews, MD. Electronically signed: Hilbert Odor, Scribe.  11/15/15, 11:35 AM   I personally performed the services described in this documentation. All medical record entries made by the scribe were at my direction. I have reviewed the chart and agree that the record reflects my personal performance and is accurate and complete. Murray Hodgkins, MD

## 2015-11-15 NOTE — Progress Notes (Signed)
CRITICAL VALUE ALERT  Critical value received:  K 2.4   Date of notification:  11/14/45  Time of notification:  0710  Critical value read back yes   Nurse who received alert:  Pamala Hurry   MD notified (1st page): Sarajane Jews   Time of first page:  0723  MD notified (2nd page):  Time of second page:  Responding MD:   Time MD responded:

## 2015-11-15 NOTE — Progress Notes (Signed)
Physical Therapy Treatment Patient Details Name: Deborah Murray MRN: [id number] DOB: [birth year] Today's Date: 11/15/2015    History of Present Illness 55 yo F admitted 10/30/2015 due to persistent abdominal pain and diarrhea.  She underwent back surgery 2 weeks ago at [facility].  Dx: pancolitis due to C-diff.  PMH: obesity, DJD, severe asthma, allergies, HTN, chronic back pain, Afib, and recent back surgery, syncope and collapse, thyroid disease, vocal cord dysfunction, lumbar radiculopathy, adrenal abnormality, COPD, gastric bypass, cholecystectomy, R knee surgery.    PT Comments    Pt received sitting up in the chair and was agreeable to PT tx.  PT encouraged pt to progress with gait today, and pt is agreeable to try.  She was able to ambulate 92ft with RW, although very slow gait speed.  Continue to recommend SNF due to increased needs for mobility and hygiene at this time.    Follow Up Recommendations  SNF     Equipment Recommendations  None recommended by PT    Recommendations for Other Services       Precautions / Restrictions Precautions Precautions: Back;Other (comment) Precaution Booklet Issued: No Precaution Comments: Recent spinal fusion 2 weeks prior to admission.  Restrictions Weight Bearing Restrictions: No    Mobility  Bed Mobility                  Transfers Overall transfer level: Needs assistance Equipment used: Rolling walker (2 wheeled) Transfers: Sit to/from Stand Sit to Stand: Min assist         General transfer comment: Pt assisted on/off BSC at end of tx. Total A for peri-hygiene.   Ambulation/Gait Ambulation/Gait assistance: Min guard Ambulation Distance (Feet): 20 Feet Assistive device: Rolling walker (2 wheeled) Gait Pattern/deviations: Step-to pattern   Gait velocity interpretation: <1.8 ft/sec, indicative of risk for recurrent falls General Gait Details: vc's for gait pattern and encouragement for pt to advance the RW instead of  therapist.  Pt with very slow decreased gait speed.    Stairs            Wheelchair Mobility    Modified Rankin (Stroke Patients Only)       Balance           Standing balance support: Bilateral upper extremity supported Standing balance-Leahy Scale: Fair                      Cognition Arousal/Alertness: Awake/alert Behavior During Therapy: WFL for tasks assessed/performed Overall Cognitive Status: Within Functional Limits for tasks assessed                      Exercises      General Comments        Pertinent Vitals/Pain Pain Assessment: No/denies pain    Home Living                      Prior Function            PT Goals (current goals can now be found in the care plan section) Acute Rehab PT Goals Patient Stated Goal: To go home PT Goal Formulation: With patient/family Time For Goal Achievement: 11/22/15 Potential to Achieve Goals: Good Progress towards PT goals: Progressing toward goals    Frequency  Min 5X/week    PT Plan Current plan remains appropriate    Co-evaluation             End of Session Equipment Utilized During Treatment: Gait  belt Activity Tolerance: Patient tolerated treatment well Patient left: in chair;with call bell/phone within reach;with nursing/sitter in room     Time: 1447-1510 PT Time Calculation (min) (ACUTE ONLY): 23 min  Charges:  $Gait Training: 8-22 mins $Therapeutic Activity: 8-22 mins                    G Codes:      Beth Kaleya Douse, PT, DPT X: [phone]

## 2015-11-16 DIAGNOSIS — E876 Hypokalemia: Secondary | ICD-10-CM

## 2015-11-16 LAB — RENAL FUNCTION PANEL
ALBUMIN: 2.3 g/dL — AB (ref 3.5–5.0)
ANION GAP: 6 (ref 5–15)
BUN: 20 mg/dL (ref 6–20)
CALCIUM: 7.6 mg/dL — AB (ref 8.9–10.3)
CO2: 27 mmol/L (ref 22–32)
CREATININE: 1.45 mg/dL — AB (ref 0.44–1.00)
Chloride: 101 mmol/L (ref 101–111)
GFR calc Af Amer: 45 mL/min — ABNORMAL LOW (ref >60)
GFR calc non Af Amer: 39 mL/min — ABNORMAL LOW (ref >60)
GLUCOSE: 82 mg/dL (ref 65–99)
PHOSPHORUS: 3.5 mg/dL (ref 2.5–4.6)
Potassium: 2.4 mmol/L — CL (ref 3.5–5.1)
SODIUM: 134 mmol/L — AB (ref 135–145)

## 2015-11-16 MED ORDER — POTASSIUM CHLORIDE CRYS ER 20 MEQ PO TBCR
40.0000 meq | EXTENDED_RELEASE_TABLET | Freq: Two times a day (BID) | ORAL | Status: DC
  Administered 2015-11-16 (×2): 40 meq via ORAL
  Filled 2015-11-16 (×2): qty 2

## 2015-11-16 MED ORDER — POTASSIUM CHLORIDE IN NACL 40-0.9 MEQ/L-% IV SOLN
INTRAVENOUS | Status: DC
  Administered 2015-11-16: 50 mL/h via INTRAVENOUS
  Administered 2015-11-17 – 2015-11-19 (×3): 75 mL/h via INTRAVENOUS

## 2015-11-16 MED ORDER — POTASSIUM CHLORIDE 10 MEQ/100ML IV SOLN
10.0000 meq | INTRAVENOUS | Status: AC
  Administered 2015-11-16 (×2): 10 meq via INTRAVENOUS
  Filled 2015-11-16: qty 100

## 2015-11-16 MED ORDER — POTASSIUM CHLORIDE CRYS ER 20 MEQ PO TBCR
40.0000 meq | EXTENDED_RELEASE_TABLET | ORAL | Status: DC
  Administered 2015-11-16: 40 meq via ORAL

## 2015-11-16 NOTE — Progress Notes (Signed)
PROGRESS NOTE  Deborah Murray [id number] DOB: 07/21/1960 DOA: 10/30/2015 PCP: Sol Passer, MD  Brief Narrative: 55 year old woman with a hx of afib HTN, adrenal insufficiency and recent back surgery presented with complaints of abdominal pain and diarrhea. CT a/p revealed diffused edematous wall thickening compatible with pancolitis. C diff positive. Follow up CT revealed worsening pancolitis. Surgery and GI were consulted. Conservative management was pursued. Additionally, nephrology was consulted for acute kidney injury and anasarca. Nephrology following. Since patient has been hospitalized for some time, PT has evaluated the patient and recommends SNF upon discharge.  Assessment/Plan: 1. Pancolitis secondary to C diff colitis. Diarrhea improving; reports only 1 BM today. Remains afebrile.  2. Anasarca secondary to aggressive hydration in the setting of sepsis. Slowly improving with aggressive diuresis. 3. Hypokalemia. Secondary to aggressive diuresis. Asymptomatic. Telemetry unremarkable. Continue to monitor. 4. Acute kidney injury secondary to sepsis. Resolved. 5. Atrial fibrillation.Remains in sinus rhythm. Continue anticoagulation with Xarelto. 6. Adrenal insufficiency. Treated with stress dose steroids during initial part of the illness but now stable on chronic therapy. 7. Hyponatremia. Stable 8. Severe malnutrition.  9. Morbid obesity.    Remains stable. Main issue is anasarca with massive volume overload.  Continue IV diuresis  Aggressive oral and IV potassium repletion  Continue oral vanc  Hopefull discharge Monday   DVT prophylaxis: Xarelto  Code Status: Full Family Communication:  No family at bedside Disposition Plan: Discharge to [facility] on Monday  Murray Hodgkins, MD  [facility] Direct contact: [phone] --Via [place]  --[email or web address]; password [email or web address]  7PM-7AM contact night coverage as above 11/16/2015, 7:02 AM  LOS: 17 days   Consultants:     GI  Nutrition   General surgery  Nephrology  PT  Procedures:   None   Antimicrobials:   Vanc 7/23 >>  Flagyl7/19 >>7/30  HPI/Subjective: Pt is not feeling well today. Her stomach is a little tender that started this morning. She is nauseous. Had one BM today.   Objective: Vitals:   11/15/15 0805 11/15/15 1940 11/15/15 2119 11/16/15 0559  BP:   118/84   Pulse:   84   Resp:   18   Temp:   98.6 F (37 C)   TempSrc:   Oral   SpO2: 97% 96% 96%   Weight:    (!) 142 kg (313 lb 0.9 oz)  Height:        Intake/Output Summary (Last 24 hours) at 11/16/15 0702 Last data filed at 11/16/15 0602  Gross per 24 hour  Intake              760 ml  Output             3600 ml  Net            -2840 ml     Filed Weights   11/14/15 0418 11/15/15 0520 11/16/15 0559  Weight: 135.8 kg (299 lb 6.9 oz) (!) 140 kg (308 lb 10.3 oz) (!) 142 kg (313 lb 0.9 oz)    Exam:  Constitutional:  . Appears calm and comfortable Respiratory:  . CTA bilaterally, no w/r/r.  . Respiratory effort normal. No retractions or accessory muscle use Cardiovascular:  . RRR, no m/r/g . 4+ lower extremity extremity edema   Abdomen:  . Abdomen appears soft, non distended, generally tender Psychiatric:  . judgement and insight appear normal . Mental status o Mood, affect appropriate   I have personally reviewed following labs and imaging studies:  Potassium 2.4  BUN 20 improved, Cr 1.45 stable  Glucose 82 stable  Scheduled Meds: . budesonide  0.5 mg Nebulization BID  . citalopram  20 mg Oral QHS  . DULoxetine  30 mg Oral Daily  . feeding supplement  1 Container Oral TID BM  . feeding supplement (PRO-STAT SUGAR FREE 64)  30 mL Oral BID  . furosemide  200 mg Intravenous BID  . gabapentin  600 mg Oral TID  . latanoprost  1 drop Both Eyes QHS  . loratadine  10 mg Oral Daily  . metolazone  5 mg Oral BID  . montelukast  10 mg Oral QHS  . nystatin  5 mL Oral QID  . predniSONE  15 mg Oral  Q breakfast  . promethazine  12.5 mg Oral TID AC & HS  . rivaroxaban  20 mg Oral Q supper  . saccharomyces boulardii  250 mg Oral BID  . sodium chloride flush  10-40 mL Intracatheter Q12H  . tiotropium  1 capsule Inhalation Daily  . [START ON 11/30/2015] vancomycin  125 mg Oral Q6H  . vancomycin  250 mg Oral Q6H   Continuous Infusions:   Principal Problem:   Pancolitis (HCC) Active Problems:   Hypokalemia   Essential hypertension   Severe persistent asthma   Morbid obesity (HCC)   C. difficile diarrhea   Abdominal pain in female   Hypotension   History of atrial fibrillation- NSR now   History of adrenal insufficiency   History of hypertension   C. difficile colitis   Metabolic acidosis   Hypocalcemia   Acute kidney injury ([place])   Hyponatremia   Sepsis ([place])   AKI (acute kidney injury) ([place])   Pyrexia   Protein-calorie malnutrition, severe   Generalized abdominal pain   Colitis   LOS: 17 days   Time spent 20 minutes  By signing my name below, I, Hilbert Odor, attest that this documentation has been prepared under the direction and in the presence of [place]. Sarajane Jews, MD. Electronically signed: Hilbert Odor, Scribe.  11/16/15, 1:00pm  I personally performed the services described in this documentation. All medical record entries made by the scribe were at my direction. I have reviewed the chart and agree that the record reflects my personal performance and is accurate and complete. Murray Hodgkins, MD

## 2015-11-16 NOTE — Progress Notes (Signed)
Subjective: Interval History: Patient continued to have diarrhea. Patient presently states that there is no significant change. She has some abdominal pain. Her appetite is good and no nausea or vomiting.  Objective: Vital signs in isn't requiring me I try always a st 24 hours: Temp:  [98.6 F (37 C)] 98.6 F (37 C) (08/04 2119) Pulse Rate:  [84] 84 (08/04 2119) Resp:  [18] 18 (08/04 2119) BP: (118)/(84) 118/84 (08/04 2119) SpO2:  [96 %] 96 % (08/04 2119) Weight:  [142 kg (313 lb 0.9 oz)] 142 kg (313 lb 0.9 oz) (08/05 0559) Weight change: 2 kg (4 lb 6.6 oz)  Intake/Output from previous day: 08/04 0701 - 08/05 0700 In: 760 [P.O.:600; I.V.:20; IV Piggyback:140] Out: 3600 [Urine:3600] Intake/Output this shift: No intake/output data recorded.  Generally patient is alert and in no apparent distress Chest is clear to auscultation Heart exam revealed regular rate and rhythm no murmur Abdomen: Somewhat distended, mildly tender, no rebound tenderness and positive bowel sound Extremities 2-+ edema  Lab Results: No results for input(s): WBC, HGB, HCT, PLT in the last 72 hours. BMET:   Recent Labs  11/14/15 0920 11/15/15 0639  NA 132* 136  136  K 2.4* 2.4*  2.4*  CL 100* 102  101  CO2 26 27  27   GLUCOSE 102* 105*  104*  BUN 25* 22*  22*  CREATININE 1.61* 1.42*  1.41*  CALCIUM 7.6* 7.9*  8.0*   No results for input(s): PTH in the last 72 hours. Iron Studies: No results for input(s): IRON, TIBC, TRANSFERRIN, FERRITIN in the last 72 hours.  Studies/Results: No results found.  I have reviewed the patient's current medications.  Assessment/Plan: Problem #1 acute kidney injury: Her Renal function continued to improve. Patient presently asymptomatic. Problem #2 . Pancolitis: Her diarrhea is improving. Presently being followed by GI. Problem #3 history of asthma: She is on inhalers. No difficulty in breathing Problem #4 low CO2: Respiratory alkalosis. Her CO2 is  stable. Problem #5 hypophosphatemia: Her phosphorus is normal Problem #6 hypokalemia: Her potassium has remained low in spite of potassium supplement. Is improving. Problem #7 hypocalcemia on calcium supplement. Her calcium is normal. Problem #8 anasarca: Patient presently on Lasix and metolazone combination. She has 3600 cc of urine output. Her anasarca is improving. Problem #9 hypotension: Her blood pressure is in normal range Plan: 1] we'll give patient KCl 10 mEq IV for 3 doses. 2] we'll start her on normal saline with 40 mEq of KCl at 50 mL per hour 3] we'll change KCl 40 mEq by mouth twice a day  4] we'll switch her from IV Lasix to by mouth diuretics and possibly on Monday. Presently we'll continue with Lasix IV as patient has significant fluid overload 5] we'll check her renal panel in the morning.   LOS: 17 days   Ardell Aaronson S 11/16/2015,8:25 AM  [email or web address]

## 2015-11-17 ENCOUNTER — Inpatient Hospital Stay (HOSPITAL_COMMUNITY): Payer: Medicare Other

## 2015-11-17 LAB — RENAL FUNCTION PANEL
ALBUMIN: 2.3 g/dL — AB (ref 3.5–5.0)
Anion gap: 7 (ref 5–15)
BUN: 16 mg/dL (ref 6–20)
CHLORIDE: 100 mmol/L — AB (ref 101–111)
CO2: 28 mmol/L (ref 22–32)
Calcium: 7.4 mg/dL — ABNORMAL LOW (ref 8.9–10.3)
Creatinine, Ser: 1.4 mg/dL — ABNORMAL HIGH (ref 0.44–1.00)
GFR calc Af Amer: 47 mL/min — ABNORMAL LOW (ref >60)
GFR calc non Af Amer: 41 mL/min — ABNORMAL LOW (ref >60)
GLUCOSE: 88 mg/dL (ref 65–99)
POTASSIUM: 2.3 mmol/L — AB (ref 3.5–5.1)
Phosphorus: 3.5 mg/dL (ref 2.5–4.6)
Sodium: 135 mmol/L (ref 135–145)

## 2015-11-17 LAB — MAGNESIUM: Magnesium: 2 mg/dL (ref 1.7–2.4)

## 2015-11-17 MED ORDER — POTASSIUM CHLORIDE CRYS ER 20 MEQ PO TBCR
40.0000 meq | EXTENDED_RELEASE_TABLET | ORAL | Status: DC
Start: 2015-11-17 — End: 2015-11-17

## 2015-11-17 MED ORDER — POTASSIUM CHLORIDE CRYS ER 20 MEQ PO TBCR
60.0000 meq | EXTENDED_RELEASE_TABLET | ORAL | Status: AC
  Administered 2015-11-17 – 2015-11-18 (×5): 60 meq via ORAL
  Filled 2015-11-17 (×5): qty 3

## 2015-11-17 MED ORDER — FUROSEMIDE 10 MG/ML IJ SOLN
120.0000 mg | Freq: Two times a day (BID) | INTRAVENOUS | Status: DC
  Administered 2015-11-17 – 2015-11-19 (×4): 120 mg via INTRAVENOUS
  Filled 2015-11-17 (×5): qty 12
  Filled 2015-11-17: qty 10

## 2015-11-17 NOTE — Progress Notes (Signed)
PROGRESS NOTE  Deborah Murray [id number] DOB: February 15, 1961 DOA: 10/30/2015 PCP: Sol Passer, MD  Brief Narrative: 55 year old woman with a hx of afib HTN, adrenal insufficiency and recent back surgery presented with complaints of abdominal pain and diarrhea. CT a/p revealed diffused edematous wall thickening compatible with pancolitis. C diff positive. Follow up CT revealed worsening pancolitis. Surgery and GI were consulted. Conservative management was pursued. Additionally, nephrology was consulted for acute kidney injury and anasarca. Nephrology following.    Assessment/Plan: 1. Anasarca secondary to aggressive hydration in the setting of sepsis. Excellent diuresis but total I/O inaccurate. Massive volume overload persists. 2. Severe hypokalemia secondary to aggressive diuresis. Remains a symptomatic. Telemetry sinus rhythm. Will increase dose, replete aggressively. 3. Nausea. Abdominal exam overall benign. Some right-sided abdominal pain of unclear significance. Abdominal radiograph unremarkable. No evidence of ileus. May be related to medications and hypokalemia. Continue to monitor clinically. 4. Pancolitis secondary to C diff colitis. Improving. Several bowel movements per day. 5. Acute kidney injury secondary to sepsis. Resolved. Renal function remains stable with aggressive diuresis. 6. Atrial fibrillation. Remains in sinus rhythm. Continue anticoagulation with Xarelto. 7. Adrenal insufficiency. Treated with stress dose steroids during initial part of the illness. Continue chronic prednisone. 8. Severe malnutrition.  9. Morbid obesity.    Continue IV diuresis per nephrology.  Aggressive oral and IV potassium repletion  Anticipated transfer skilled nursing facility when volume overload resolved and potassium has normalized.  DVT prophylaxis: Xarelto  Code Status: Full Family Communication:  No family at bedside  Disposition Plan: Discharge to [place],  MD  [facility] Direct contact: [phone] --Via amion app [place]  --[email or web address]; password [email or web address]  7PM-7AM contact night coverage as above 11/17/2015, 7:18 AM  LOS: 18 days   Consultants:   GI  Nutrition   General surgery  Nephrology  PT  Procedures:   None   Antimicrobials:   Vanc 7/23 >>  Flagyl7/19 >>7/30  HPI/Subjective: She reports nausea and dizziness. Has been drinking a lot of fluids. Has not ate anything today. Has abd pain that she rates as 8/10. She had diarrhea three times yesterday, but no diarrhea today.  Objective: Vitals:   11/16/15 1945 11/16/15 1946 11/16/15 2103 11/17/15 0542  BP:   104/77 108/75  Pulse:  84 78 75  Resp:  16 20 20   Temp:   98.5 F (36.9 C) 98.3 F (36.8 C)  TempSrc:   Oral Oral  SpO2: 98% 98% 99% 96%  Weight:      Height:        Intake/Output Summary (Last 24 hours) at 11/17/15 0718 Last data filed at 11/17/15 0546  Gross per 24 hour  Intake              720 ml  Output             5400 ml  Net            -4680 ml     Filed Weights   11/14/15 0418 11/15/15 0520 11/16/15 0559  Weight: 135.8 kg (299 lb 6.9 oz) (!) 140 kg (308 lb 10.3 oz) (!) 142 kg (313 lb 0.9 oz)    Exam:  Constitutional:  . Appears Uncomfortable but not toxic. Sitting in chair. Respiratory:  . CTA bilaterally, no w/r/r.  . Respiratory effort normal. No retractions or accessory muscle use Cardiovascular:  . RRR, no m/r/g . 3+ bilateral LE extremity edema without significant change   Abdomen:  .  Right sided abdominal pain with palpation, skin appears unremarkable, no rash. Nondistended. Psychiatric:  . judgement and insight appear normal . Mental status   I have personally reviewed following labs and imaging studies:  Potassium 2.3  Magnesium 2.0  Cr 1.40, stable.  Scheduled Meds: . budesonide  0.5 mg Nebulization BID  . citalopram  20 mg Oral QHS  . DULoxetine  30 mg Oral Daily  . feeding supplement  1 Container Oral  TID BM  . feeding supplement (PRO-STAT SUGAR FREE 64)  30 mL Oral BID  . furosemide  200 mg Intravenous BID  . gabapentin  600 mg Oral TID  . latanoprost  1 drop Both Eyes QHS  . loratadine  10 mg Oral Daily  . metolazone  5 mg Oral BID  . montelukast  10 mg Oral QHS  . nystatin  5 mL Oral QID  . potassium chloride  40 mEq Oral BID  . predniSONE  15 mg Oral Q breakfast  . promethazine  12.5 mg Oral TID AC & HS  . rivaroxaban  20 mg Oral Q supper  . saccharomyces boulardii  250 mg Oral BID  . sodium chloride flush  10-40 mL Intracatheter Q12H  . tiotropium  1 capsule Inhalation Daily  . [START ON 11/30/2015] vancomycin  125 mg Oral Q6H  . vancomycin  250 mg Oral Q6H   Continuous Infusions: . 0.9 % NaCl with KCl 40 mEq / L 50 mL/hr (11/16/15 0940)    Principal Problem:   Pancolitis ([place]) Active Problems:   Hypokalemia   Essential hypertension   Severe persistent asthma   Morbid obesity (HCC)   C. difficile diarrhea   Abdominal pain in female   Hypotension   History of atrial fibrillation- NSR now   History of adrenal insufficiency   History of hypertension   C. difficile colitis   Metabolic acidosis   Hypocalcemia   Acute kidney injury ([place])   Hyponatremia   Sepsis ([place])   AKI (acute kidney injury) ([place])   Pyrexia   Protein-calorie malnutrition, severe   Generalized abdominal pain   Colitis   LOS: 18 days   Time spent 20 minutes  By signing my name below, I, Hilbert Odor, attest that this documentation has been prepared under the direction and in the presence of [place]. Sarajane Jews, MD. Electronically signed: Hilbert Odor, Scribe.  11/17/15, 11:20 AM     I personally performed the services described in this documentation. All medical record entries made by the scribe were at my direction. I have reviewed the chart and agree that the record reflects my personal performance and is accurate and complete. Murray Hodgkins, MD

## 2015-11-17 NOTE — Progress Notes (Signed)
Subjective: Interval History: Patient states that her diarrhea has improved significantly. She has only 3 episodes last night. Her appetite however remains poor. She doesn't have any nausea or vomiting.  Objective: Vital signs in isn't requiring me I try always a st 24 hours: Temp:  [98.3 F (36.8 C)-99 F (37.2 C)] 98.3 F (36.8 C) (08/06 0542) Pulse Rate:  [75-85] 75 (08/06 0542) Resp:  [16-20] 20 (08/06 0542) BP: (104-110)/(74-77) 108/75 (08/06 0542) SpO2:  [96 %-99 %] 98 % (08/06 0758) Weight change:   Intake/Output from previous day: 08/05 0701 - 08/06 0700 In: 720 [P.O.:720] Out: 5400 [Urine:5400] Intake/Output this shift: No intake/output data recorded.  Generally patient is alert and in no apparent distress Chest is clear to auscultation Heart exam revealed regular rate and rhythm no murmur Abdomen: Somewhat distended, mildly tender, no rebound tenderness and positive bowel sound Extremities 2-+ edema  Lab Results: No results for input(s): WBC, HGB, HCT, PLT in the last 72 hours. BMET:   Recent Labs  11/15/15 0639 11/16/15 0735  NA 136  136 134*  K 2.4*  2.4* 2.4*  CL 102  101 101  CO2 27  27 27   GLUCOSE 105*  104* 82  BUN 22*  22* 20  CREATININE 1.42*  1.41* 1.45*  CALCIUM 7.9*  8.0* 7.6*   No results for input(s): PTH in the last 72 hours. Iron Studies: No results for input(s): IRON, TIBC, TRANSFERRIN, FERRITIN in the last 72 hours.  Studies/Results: No results found.  I have reviewed the patient's current medications.  Assessment/Plan: Problem #1 acute kidney injury: Her Renal function continued to improve.  Problem #2 . Pancolitis: Her diarrhea Has improved significantly. She has still some abdominal pain but overall much better. Problem #3 history of asthma: She is on inhalers. No difficulty in breathing Problem #4 low CO2: Respiratory alkalosis. Her CO2 is stable. Problem #5 hypophosphatemia: Her phosphorus is normal Problem #6  hypokalemia: Her potassium has remained low this is due to diuretics. Presently patient is on potassium supplement.but her potassium remains low Problem #7 hypocalcemia on calcium supplement. Her calcium is normal. Problem #8 anasarca: Patient presently on Lasix and metolazone combination. Patient had 5400 mL of urine output the last 24 hours. At this moment patient seems to have still significant anasarca.  Problem #9 hypotension: Her blood pressure is in normal range Plan: 1] we'll increase his ns with kcl to 75 cc/hr 2]  panel in the morning. 3]Change her lasix to 120 mg iv bid   LOS: 18 days   Aadhav Uhlig S 11/17/2015,9:32 AM  [email or web address]

## 2015-11-18 DIAGNOSIS — R601 Generalized edema: Secondary | ICD-10-CM

## 2015-11-18 LAB — CBC
HEMATOCRIT: 30.3 % — AB (ref 36.0–46.0)
Hemoglobin: 9.9 g/dL — ABNORMAL LOW (ref 12.0–15.0)
MCH: 28.9 pg (ref 26.0–34.0)
MCHC: 32.7 g/dL (ref 30.0–36.0)
MCV: 88.3 fL (ref 78.0–100.0)
Platelets: 353 10*3/uL (ref 150–400)
RBC: 3.43 MIL/uL — ABNORMAL LOW (ref 3.87–5.11)
RDW: 17.3 % — AB (ref 11.5–15.5)
WBC: 8.6 10*3/uL (ref 4.0–10.5)

## 2015-11-18 LAB — RENAL FUNCTION PANEL
Albumin: 2.3 g/dL — ABNORMAL LOW (ref 3.5–5.0)
Anion gap: 11 (ref 5–15)
BUN: 14 mg/dL (ref 6–20)
CALCIUM: 7.7 mg/dL — AB (ref 8.9–10.3)
CHLORIDE: 98 mmol/L — AB (ref 101–111)
CO2: 29 mmol/L (ref 22–32)
CREATININE: 1.39 mg/dL — AB (ref 0.44–1.00)
GFR calc Af Amer: 48 mL/min — ABNORMAL LOW (ref >60)
GFR calc non Af Amer: 41 mL/min — ABNORMAL LOW (ref >60)
GLUCOSE: 86 mg/dL (ref 65–99)
Phosphorus: 3.3 mg/dL (ref 2.5–4.6)
Potassium: 2.5 mmol/L — CL (ref 3.5–5.1)
SODIUM: 138 mmol/L (ref 135–145)

## 2015-11-18 MED ORDER — POTASSIUM CHLORIDE CRYS ER 20 MEQ PO TBCR
60.0000 meq | EXTENDED_RELEASE_TABLET | Freq: Four times a day (QID) | ORAL | Status: DC
  Administered 2015-11-18 – 2015-11-19 (×6): 60 meq via ORAL
  Filled 2015-11-18 (×6): qty 3

## 2015-11-18 NOTE — Progress Notes (Signed)
K+ 2.5 - Dr. Sarajane Jews made aware

## 2015-11-18 NOTE — Progress Notes (Signed)
PROGRESS NOTE  Deborah Murray [id number] DOB: March 05, 1961 DOA: 10/30/2015 PCP: Sol Passer, MD  Brief Narrative: 55 year old woman with a hx of afib HTN, adrenal insufficiency and recent back surgery presented with complaints of abdominal pain and diarrhea. CT a/p revealed diffused edematous wall thickening compatible with pancolitis. C diff positive. Follow up CT revealed worsening pancolitis. Surgery and GI were consulted. Conservative management was pursued. Additionally, nephrology was consulted for acute kidney injury and anasarca. Nephrology following.   Assessment/Plan: 1. Anasarca secondary to aggressive hydration in the setting of sepsis.  excellent diuresis. I/O inaccurate. Clinically improving however. Continues to have significant lower extremity edema. 2. Severe hypokalemia secondary to diuresis. Asymptomatic. Telemetry sinus rhythm. 3. Nausea. Minimal now. Abdominal exam benign. Abdominal radiograph was unremarkable. Follow clinically. 4. Pancolitis secondary to C diff colitis.  overall improving. Several loose stools per day. 5. Acute kidney injury secondary to sepsis. Resolved. Renal function remains stable with aggressive diuresis. 6. Atrial fibrillation. Remains in sinus rhythm. Continue Xarelto. 7. Adrenal insufficiency. Treated with stress dose steroids during initial part of hospitalization. Continue chronic prednisone. 8. Severe malnutrition.  9. Morbid obesity.    Better today. Tolerating diet. Lower extremity edema starting to improve.  Continue oral vancomycin. Continue diuretics per nephrology.   Replete potassium aggressively.   DVT prophylaxis: Xarelto Code Status: [place] Family Communication: No family bedside Disposition Plan: Discharge to [facility]   Murray Hodgkins, MD  [facility] Direct contact: [phone] --Via amion app OR  --[email or web address]; password [email or web address]  7PM-7AM contact night coverage as above 11/18/2015, 7:08 AM  LOS: 19 days    Consultants:  GI  Nutrition   General surgery  Nephrology  PT  Procedures:  None   Antimicrobials:  Vanc 7/23 >>  Flagyl7/19 >>7/30  HPI/Subjective: Feels better today. No abdominal pain. Does have some abdominal pain with bowel movement. Several episodes of diarrhea yesterday. One episode today. Minimal nausea. No vomiting. Ate dinner and breakfast well. Decreasing edema. Mild dizziness when getting up.  Objective: Vitals:   11/17/15 1436 11/17/15 1953 11/17/15 1955 11/17/15 2256  BP: 103/71   116/79  Pulse: 84  89 80  Resp: 18  18 20   Temp: 98.3 F (36.8 C)   97.5 F (36.4 C)  TempSrc: Oral   Oral  SpO2: 100% 98% 98% 100%  Weight:      Height:        Intake/Output Summary (Last 24 hours) at 11/18/15 0708 Last data filed at 11/18/15 0600  Gross per 24 hour  Intake          5811.25 ml  Output             2000 ml  Net          3811.25 ml     Filed Weights   11/14/15 0418 11/15/15 0520 11/16/15 0559  Weight: 135.8 kg (299 lb 6.9 oz) (!) 140 kg (308 lb 10.3 oz) (!) 142 kg (313 lb 0.9 oz)    Exam:    Constitutional:  . Appears calm and comfortable. Appears better today. Respiratory:  . CTA bilaterally, no w/r/r.  . Respiratory effort normal. No retractions or accessory muscle use Cardiovascular:  . RRR, no m/r/g . 3+ bilaterally LE extremity edema improving   . Telemetry sinus rhythm Abdomen:  . soft and non distrended. Nontender.  I have personally reviewed following labs and imaging studies:  Potassium 2.5, stable.  Creatinine stable, 1.39.  Hgb 9.9  Scheduled Meds: . budesonide  0.5 mg Nebulization BID  . citalopram  20 mg Oral QHS  . DULoxetine  30 mg Oral Daily  . feeding supplement  1 Container Oral TID BM  . feeding supplement (PRO-STAT SUGAR FREE 64)  30 mL Oral BID  . furosemide  120 mg Intravenous BID  . gabapentin  600 mg Oral TID  . latanoprost  1 drop Both Eyes QHS  . loratadine  10 mg Oral Daily  . metolazone  5  mg Oral BID  . montelukast  10 mg Oral QHS  . nystatin  5 mL Oral QID  . predniSONE  15 mg Oral Q breakfast  . promethazine  12.5 mg Oral TID AC & HS  . rivaroxaban  20 mg Oral Q supper  . saccharomyces boulardii  250 mg Oral BID  . sodium chloride flush  10-40 mL Intracatheter Q12H  . tiotropium  1 capsule Inhalation Daily  . [START ON 11/30/2015] vancomycin  125 mg Oral Q6H  . vancomycin  250 mg Oral Q6H   Continuous Infusions: . 0.9 % NaCl with KCl 40 mEq / L 75 mL/hr (11/17/15 2138)    Principal Problem:   Pancolitis ([place]) Active Problems:   Hypokalemia   Essential hypertension   Severe persistent asthma   Morbid obesity (HCC)   C. difficile diarrhea   Abdominal pain in female   Hypotension   History of atrial fibrillation- NSR now   History of adrenal insufficiency   History of hypertension   C. difficile colitis   Metabolic acidosis   Hypocalcemia   Acute kidney injury ([place])   Hyponatremia   Sepsis ([place])   AKI (acute kidney injury) ([place])   Pyrexia   Protein-calorie malnutrition, severe   Generalized abdominal pain   Colitis   LOS: 19 days   Time spent 25  minutes   By signing my name below, I, Collene Leyden, attest that this documentation has been prepared under the direction and in the presence of Murray Hodgkins, MD. Electronically signed: Collene Leyden, Scribe 11/18/15 12:30 PM   I personally performed the services described in this documentation. All medical record entries made by the scribe were at my direction. I have reviewed the chart and agree that the record reflects my personal performance and is accurate and complete. Murray Hodgkins, MD

## 2015-11-18 NOTE — Discharge Summary (Signed)
Physician Discharge Summary  Deborah Murray [id number] DOB: 1960-04-24 DOA: 10/30/2015  PCP: Sol Passer, MD  Admit date: 10/30/2015 Discharge date: 11/19/2015  Recommendations for Outpatient Follow-up:  1. Follow-up resolution of C. difficile colitis  2. Anasarca 3. Hypokalemia   Follow-up Information    [name], [name], MD. Schedule an appointment as soon as possible for a visit in 3 week(s).   Specialty:  Internal Medicine Contact information: [address] [place] [address] [place], [place] Follow up in 6 week(s).   Specialty:  Gastroenterology Contact information: [address] [place] [address] [phone]          Discharge Diagnoses:  1. Sepsis secondary to pancolitis secondary to C. difficile colitis  2. Acute kidney injury  3. Anasarca 4. Severe hypokalemia  5. Atrial fibrillation  6. Chronic adrenal insufficiency  7. Severe malnutrition  8. Morbid obesity.   Discharge Condition: Stable  Disposition: Long term acute care facility.   Diet recommendation: Heart healthy   Filed Weights   11/15/15 0520 11/16/15 0559 11/18/15 0708  Weight: (!) 140 kg (308 lb 10.3 oz) (!) 142 kg (313 lb 0.9 oz) 117.8 kg (259 lb 12.8 oz)    History of present illness:  55 year old woman with a hx of afib HTN, adrenal insufficiency and recent back surgery presented with complaints of abdominal pain and diarrhea for two weeks. P While in the ED CT revealed diffuse pancolitis and elevated WBC. She denied N/V, CP, SOB, chills, joint pain, and rash. Pt was admitted for further management of diffuse pancolitis.    Hospital Course: Patient was started on appropriate therapy, however initially responded poorly, developing worsening pancolitis with concern for toxic megacolon (ruled out, followed by general surgeon who has signed off). Also seen by GI with adjustment of antibiotics. Hospitalization was prolonged by very slow response to  vancomycin. Additionally her hospitalization was complicated by sepsis, hypotension requiring vasopressor support, and resultant acute kidney injury. She was seen by nephrology. She developed anasarca secondary to aggressive volume resuscitation while hypotensive. At this point C. difficile colitis is well-controlled, she is tolerating a diet but has massive volume overload and needs further therapy with IV diuretics. Also has severe hypokalemia secondary to diuresis. Overall she is improved clinically but will need significant diuresis still and she'll be transferred to long-term acute care hospital.  1. Sepsis secondary to pancolitis secondary to C diff colitis (present on admission).Acute issues appear resolved. Minimal diarrhea. Continue vancomycin taper as below. 2. Anasarca secondary to aggressive hydration in the setting of sepsis.Excellent diuresis but continues to have significant volume overload. 3. Severe hypokalemia secondary to diuresis. Asymptomatic. Telemetry sinus rhythm. Continue repletion. 4. Acute kidney injury secondary to sepsis. Resolved.Renal function remains stable with aggressive diuresis. 5. Atrial fibrillation. Remains in sinus rhythm. Continue Xarelto.CHADS score 2.  6. Adrenal insufficiency. Treated with stress dose steroids during initial part of hospitalization. Continue chronic prednisone. 7. Severe malnutrition.  8. Morbid obesity.   Consultants:  GI  Nutrition   General surgery  Nephrology  PT  Procedures:  None    Antimicrobials: Oral vancomycin 7/23 >>  Flagyl7/19 >>7/30   Discharge Instructions  Discharge Instructions    Diet - low sodium heart healthy    Complete by:  As directed   Increase activity slowly    Complete by:  As directed       Medication List    STOP taking these  medications   ipratropium 0.02 % nebulizer solution Commonly known as:  ATROVENT   lisinopril-hydrochlorothiazide 20-25 MG tablet Commonly known  as:  PRINZIDE,ZESTORETIC   metoprolol tartrate 25 MG tablet Commonly known as:  LOPRESSOR   NORCO 5-325 MG tablet Generic drug:  HYDROcodone-acetaminophen   omalizumab 150 MG injection Commonly known as:  XOLAIR   ondansetron 4 MG tablet Commonly known as:  ZOFRAN   ondansetron 8 MG disintegrating tablet Commonly known as:  ZOFRAN ODT   phentermine 15 MG capsule   promethazine 25 MG tablet Commonly known as:  PHENERGAN   VOLTAREN 1 % Gel Generic drug:  diclofenac sodium     TAKE these medications   albuterol 108 (90 Base) MCG/ACT inhaler Commonly known as:  PROVENTIL HFA;VENTOLIN HFA Inhale 2 puffs into the lungs every 4 (four) hours as needed for wheezing. What changed:  Another medication with the same name was removed. Continue taking this medication, and follow the directions you see here.   aluminum chloride 20 % external solution Commonly known as:  DRYSOL Apply 1 application topically at bedtime as needed (for sweating and applies under arms).   atorvastatin 20 MG tablet Commonly known as:  LIPITOR Take 20 mg by mouth every evening.   calcium citrate 950 MG tablet Commonly known as:  CALCITRATE - dosed in mg elemental calcium Take 1 tablet by mouth daily.   citalopram 20 MG tablet Commonly known as:  CELEXA Take 20 mg by mouth at bedtime.   clindamycin 1 % external solution Commonly known as:  CLEOCIN T Apply 1 application topically every evening. Applies to underarms   DULoxetine 30 MG capsule Commonly known as:  CYMBALTA Take 30 mg by mouth daily.   EPINEPHrine 0.15 MG/0.3ML injection Commonly known as:  EPIPEN JR Inject 0.15 mg into the muscle Once PRN.   feeding supplement (PRO-STAT SUGAR FREE 64) Liqd Take 30 mLs by mouth 2 (two) times daily.   feeding supplement Liqd Take 1 Container by mouth 3 (three) times daily between meals.   fluticasone 220 MCG/ACT inhaler Commonly known as:  FLOVENT HFA Inhale 2 puffs into the lungs every 4  (four) hours.   fluticasone 50 MCG/ACT nasal spray Commonly known as:  FLONASE Place 2 sprays in each nostril daily as needed for allergies   furosemide 120 mg in dextrose 5 % 50 mL Inject 120 mg into the vein 2 (two) times daily.   gabapentin 300 MG capsule Commonly known as:  NEURONTIN Take 900 mg by mouth 3 (three) times daily.   HYDROmorphone 2 MG tablet Commonly known as:  DILAUDID Take 1 tablet (2 mg total) by mouth every 4 (four) hours as needed for severe pain.   latanoprost 0.005 % ophthalmic solution Commonly known as:  XALATAN Place 1 drop into both eyes at bedtime.   levocetirizine 5 MG tablet Commonly known as:  XYZAL Take 5 mg by mouth every evening.   metolazone 5 MG tablet Commonly known as:  ZAROXOLYN Take 1 tablet (5 mg total) by mouth 2 (two) times daily.   montelukast 10 MG tablet Commonly known as:  SINGULAIR Take 10 mg by mouth at bedtime.   multivitamin capsule Take 1 capsule by mouth daily.   nitroGLYCERIN 0.4 MG SL tablet Commonly known as:  NITROSTAT Place 0.4 mg under the tongue every 5 (five) minutes as needed for chest pain.   potassium chloride SA 20 MEQ tablet Commonly known as:  K-DUR,KLOR-CON Take 3 tablets (60 mEq total) by mouth  4 (four) times daily. What changed:  how much to take  when to take this   predniSONE 10 MG tablet Commonly known as:  DELTASONE Take 10 mg by mouth daily.   rivaroxaban 20 MG Tabs tablet Commonly known as:  XARELTO Take 1 tablet (20 mg total) by mouth daily with supper.   SPIRIVA HANDIHALER 18 MCG inhalation capsule Generic drug:  tiotropium Place 1 capsule into inhaler and inhale daily.   spironolactone 50 MG tablet Commonly known as:  ALDACTONE Take 1 tablet (50 mg total) by mouth daily.   vancomycin 50 mg/mL oral solution Commonly known as:  VANCOCIN Starting 11/15/15: Vanc 250 mg QID for 7 days, Followed by Vanc 125 mg QID for 7 days, Followed by Vanc 125 mg BID for 7 days Followed by  Vanc 125 mg daily every 3 days for a total of 10 doses and done.   Vitamin D 2000 units tablet Take 2,000 Units by mouth daily.      Allergies  Allergen Reactions  . Aspirin Shortness Of Breath and Palpitations  . Codeine Sulfate Shortness Of Breath and Palpitations  . Darvocet [Propoxyphene N-Acetaminophen] Shortness Of Breath and Palpitations  . Tramadol Shortness Of Breath  . Penicillins Other (See Comments)    Has patient had a PCN reaction causing immediate rash, facial/tongue/throat swelling, SOB or lightheadedness with hypotension: No Has patient had a PCN reaction causing severe rash involving mucus membranes or skin necrosis: No Has patient had a PCN reaction that required hospitalization No Has patient had a PCN reaction occurring within the last 10 years: Yes If all of the above answers are "NO", then may proceed with Cephalosporin use.     The results of significant diagnostics from this hospitalization (including imaging, microbiology, ancillary and laboratory) are listed below for reference.    Significant Diagnostic Studies: Ct Abdomen Pelvis Wo Contrast  Result Date: 11/02/2015 CLINICAL DATA:  C difficile colitis.  Abdominal pain EXAM: CT ABDOMEN AND PELVIS WITHOUT CONTRAST TECHNIQUE: Multidetector CT imaging of the abdomen and pelvis was performed following the standard protocol without IV contrast. COMPARISON:  10/30/2015 FINDINGS: Lower chest and abdominal wall: Small pericardial effusion. Small pleural effusions. Aortic atherosclerosis, notable for age. Hepatobiliary: No focal liver abnormality.Cholecystectomy. No suspected bile duct dilatation. Pancreas: Unremarkable. Spleen: Unremarkable. Adrenals/Urinary Tract: Negative adrenals. No hydronephrosis or stone. The bladder is decompressed by Foley catheter. Stomach/Bowel: Diffuse marked colonic wall thickening and mesocolonic fat edema has worsened. Inflammation is pancolonic. The colon is more distended than previously.  No visible pneumatosis or perforation. No bowel obstruction. No focal pericecal inflammation. Status post gastric bypass. Reproductive:No pathologic findings. Vascular/Lymphatic: No acute vascular abnormality. No mass or adenopathy. Other: No pneumoperitoneum. Musculoskeletal: L4-5 discectomy. Interbody fusion is questionable but there is likely posterior-lateral solid bone. Remote L1 superior endplate fracture. Spondylosis. These results were called by telephone at the time of interpretation on 11/02/2015 at 9:17 pm to Dr Marin Comment, who verbally acknowledged these results. IMPRESSION: 1. Worsening C difficile pancolitis. The colon is more distended than previously, concerning for developing toxic megacolon. No perforation. 2. New small ascites and pleural effusions. Electronically Signed   By: Monte Fantasia M.D.   On: 11/02/2015 21:35   Ct Abdomen Pelvis Wo Contrast  Result Date: 10/30/2015 CLINICAL DATA:  Nausea with vomiting and diarrhea. Abdominal pain for 1 week. EXAM: CT ABDOMEN AND PELVIS WITHOUT CONTRAST TECHNIQUE: Multidetector CT imaging of the abdomen and pelvis was performed following the standard protocol without IV contrast. COMPARISON:  None. FINDINGS:  Lower chest:  Bilateral dependent atelectasis. Hepatobiliary: No focal abnormality in the liver on this study without intravenous contrast. No evidence of hepatomegaly. Gallbladder surgically absent. No intrahepatic or extrahepatic biliary dilation. Pancreas: No focal mass lesion. No dilatation of the main duct. No intraparenchymal cyst. No peripancreatic edema. Spleen: No splenomegaly. No focal mass lesion. Adrenals/Urinary Tract: No adrenal nodule or mass. No hydronephrosis in either kidney. Uninfused imaging shows no focal abnormality of either kidney. No hydroureter. Foley catheter decompresses the urinary bladder. Stomach/Bowel: Patient is status post gastric bypass surgery. Duodenum is normally positioned as is the ligament of Treitz. No small bowel  wall thickening. No small bowel dilatation. Terminal ileum unremarkable. Appendix not well seen. Marked circumferential wall thickening and edema is seen in the colon diffusely, from the cecal tip to the level of the rectum. This is associated with diffuse pericolonic edema/ inflammation. Vascular/Lymphatic: There is abdominal aortic atherosclerosis without aneurysm. There is no gastrohepatic or hepatoduodenal ligament lymphadenopathy. No intraperitoneal or retroperitoneal lymphadenopathy. No pelvic sidewall lymphadenopathy. Reproductive: The uterus is surgically absent. There is no adnexal mass. Other: No substantial intraperitoneal free fluid. Musculoskeletal: Patient is status post lower lumbar fusion. Bone windows reveal no worrisome lytic or sclerotic osseous lesions. IMPRESSION: Diffuse edematous wall thickening in the colon, from the cecal tip to the rectum associated with diffuse pericolonic edema/ inflammation. Imaging features are compatible with infectious/inflammatory pancolitis. Electronically Signed   By: Misty Stanley M.D.   On: 10/30/2015 17:03   US Renal  Result Date: 11/03/2015 CLINICAL DATA:  Acute kidney injury, UTI EXAM: RENAL / URINARY TRACT ULTRASOUND COMPLETE COMPARISON:  CT abdomen pelvis dated 11/02/2015 FINDINGS: Right Kidney: Length: 10.5 cm.  No mass or hydronephrosis. Left Kidney: Length: 10.3 cm.  No mass or hydronephrosis. Bladder: Poorly visualized/underdistended. IMPRESSION: No hydronephrosis. Electronically Signed   By: Julian Hy M.D.   On: 11/03/2015 11:58  Dg Chest Port 1 View  Result Date: 10/30/2015 CLINICAL DATA:  Abdominal pain with nausea vomiting and diarrhea. EXAM: PORTABLE CHEST 1 VIEW COMPARISON:  07/06/2015. FINDINGS: 1249 hours. Lung volumes are low. The lungs are clear wiithout focal pneumonia, edema, pneumothorax or pleural effusion. Cardiopericardial silhouette is at upper limits of normal for size. The visualized bony structures of the thorax are  intact. Telemetry leads overlie the chest. IMPRESSION: Low volume film without acute cardiopulmonary findings. Electronically Signed   By: Misty Stanley M.D.   On: 10/30/2015 13:00   Dg Abd 2 Views  Result Date: 11/07/2015 CLINICAL DATA:  Diffuse abdominal pain for 1 week. EXAM: ABDOMEN - 2 VIEW COMPARISON:  11/04/2015 and CT, 11/02/2015. FINDINGS: Bowel small bowel prominence is noted, without air-fluid levels noted on the decubitus view. There is no evidence of obstruction. There is no convincing free air. Clips are upper quadrant reflect a prior cholecystectomy. There changes from a previous lower lumbar spine posterior fusion. IMPRESSION: 1. No evidence of bowel obstruction, significant adynamic ileus or free air. Electronically Signed   By: Lajean Manes M.D.   On: 11/07/2015 17:31  Dg Abd Portable 1v  Result Date: 11/17/2015 CLINICAL DATA:  Abdominal pain.  Lumbar fusion. EXAM: PORTABLE ABDOMEN - 1 VIEW COMPARISON:  November 07, 2015 FINDINGS: The patient is status post fusion of L4-5. Previous cholecystectomy. Evaluation is limited due to patient body habitus but there is no evidence of obstruction. No free air, portal venous gas, or pneumatosis identified. IMPRESSION: No acute abnormalities identified. Electronically Signed   By: Dorise Bullion III M.D   On: 11/17/2015  14:32   Dg Abd Portable 1v  Result Date: 11/04/2015 CLINICAL DATA:  Colitis EXAM: PORTABLE ABDOMEN - 1 VIEW COMPARISON:  CT 11/02/2015 FINDINGS: Nonobstructive bowel gas pattern. No visible free air on these supine views. No organomegaly or suspicious calcification. Prior fusion in the lower lumbar spine. IMPRESSION: No evidence of bowel obstruction or free air. Electronically Signed   By: Rolm Baptise M.D.   On: 11/04/2015 12:09   Microbiology: No results found for this or any previous visit (from the past 240 hour(s)).   Labs: Basic Metabolic Panel:  Recent Labs Lab 11/15/15 0639 11/16/15 0735 11/17/15 0911  11/18/15 0605 11/19/15 0630  NA 136  136 134* 135 138 136  139  K 2.4*  2.4* 2.4* 2.3* 2.5* 2.7*  2.4*  CL 102  101 101 100* 98* 101  99*  CO2 27  27 27 28 29 28  29   GLUCOSE 105*  104* 82 88 86 82  78  BUN 22*  22* 20 16 14 12  12   CREATININE 1.42*  1.41* 1.45* 1.40* 1.39* 1.29*  1.28*  CALCIUM 7.9*  8.0* 7.6* 7.4* 7.7* 7.4*  7.8*  MG 1.9  --  2.0  --   --   PHOS 4.7* 3.5 3.5 3.3 2.9   Liver Function Tests:  Recent Labs Lab 11/15/15 0639 11/16/15 0735 11/17/15 0911 11/18/15 0605 11/19/15 0630  AST  --   --   --   --  17  ALT  --   --   --   --  10*  ALKPHOS  --   --   --   --  52  BILITOT  --   --   --   --  0.5  PROT  --   --   --   --  4.4*  ALBUMIN 2.4* 2.3* 2.3* 2.3* 2.4*  2.3*   CBC:  Recent Labs Lab 11/13/15 0509 11/18/15 0605  WBC 11.8* 8.6  HGB 11.3* 9.9*  HCT 33.8* 30.3*  MCV 85.6 88.3  PLT 334 353    Principal Problem:   Pancolitis (HCC) Active Problems:   Hypokalemia   Essential hypertension   Severe persistent asthma   Morbid obesity (HCC)   C. difficile diarrhea   Abdominal pain in female   Hypotension   History of atrial fibrillation- NSR now   History of adrenal insufficiency   History of hypertension   C. difficile colitis   Metabolic acidosis   Hypocalcemia   Acute kidney injury (HCC)   Hyponatremia   Sepsis (HCC)   AKI (acute kidney injury) ([place])   Pyrexia   Protein-calorie malnutrition, severe   Generalized abdominal pain   Colitis   Anasarca   Time coordinating discharge: 35 Minutes   Signed:  Murray Hodgkins, MD [facility] 11/19/2015, 1:56 PM  By signing my name below, I, Collene Leyden, attest that this documentation has been prepared under the direction and in the presence of Murray Hodgkins, MD. Electronically signed: Collene Leyden, Scribe 11/19/15 12:35 PM   I personally performed the services described in this documentation. All medical record entries made by the scribe were at my  direction. I have reviewed the chart and agree that the record reflects my personal performance and is accurate and complete. Murray Hodgkins, MD

## 2015-11-18 NOTE — Progress Notes (Signed)
Subjective: Interval History: Patient continued to have diarrhea. At this moment seems to be getting worse. She denies any fever or chills or sweating.  Objective: Vital signs in isn't requiring me I try always a st 24 hours: Temp:  [97.5 F (36.4 C)-98.5 F (36.9 C)] 98.5 F (36.9 C) (08/07 0708) Pulse Rate:  [80-89] 86 (08/07 0708) Resp:  [18-20] 20 (08/07 0708) BP: (103-116)/(71-84) 110/84 (08/07 0708) SpO2:  [98 %-100 %] 100 % (08/07 0708) Weight:  [117.8 kg (259 lb 12.8 oz)] 117.8 kg (259 lb 12.8 oz) (08/07 0708) Weight change:   Intake/Output from previous day: 08/06 0701 - 08/07 0700 In: 5811.3 [P.O.:1200; I.V.:2611.3] Out: 2000 [Urine:2000] Intake/Output this shift: Total I/O In: -  Out: 200 [Urine:200]  Generally patient is alert and in no apparent distress Chest is clear to auscultation Heart exam revealed regular rate and rhythm no murmur Abdomen: Somewhat distended, mildly tender, no rebound tenderness and positive bowel sound Extremities 2-+ edema  Lab Results: No results for input(s): WBC, HGB, HCT, PLT in the last 72 hours. BMET:   Recent Labs  11/17/15 0911 11/18/15 0605  NA 135 138  K 2.3* 2.5*  CL 100* 98*  CO2 28 29  GLUCOSE 88 86  BUN 16 14  CREATININE 1.40* 1.39*  CALCIUM 7.4* 7.7*   No results for input(s): PTH in the last 72 hours. Iron Studies: No results for input(s): IRON, TIBC, TRANSFERRIN, FERRITIN in the last 72 hours.  Studies/Results: Dg Abd Portable 1v  Result Date: 11/17/2015 CLINICAL DATA:  Abdominal pain.  Lumbar fusion. EXAM: PORTABLE ABDOMEN - 1 VIEW COMPARISON:  November 07, 2015 FINDINGS: The patient is status post fusion of L4-5. Previous cholecystectomy. Evaluation is limited due to patient body habitus but there is no evidence of obstruction. No free air, portal venous gas, or pneumatosis identified. IMPRESSION: No acute abnormalities identified. Electronically Signed   By: Dorise Bullion III M.D   On: 11/17/2015 14:32     I have reviewed the patient's current medications.  Assessment/Plan: Problem #1 acute kidney injury: Her Renal function continued to improve. Presently she is asymptomatic. Problem #2 . Pancolitis: Her diarrhea seems to be getting worse. She has still some abdominal pain and nausea. Problem #3 history of asthma: She is on inhalers. No difficulty in breathing Problem #4 low CO2: Respiratory alkalosis. Her CO2 is stable. Problem #5 hypophosphatemia: Her phosphorus is normal Problem #6 hypokalemia: Her potassium has remained low but improving. Possibly a combination of GI and renal loss. Problem #7 hypocalcemia on calcium supplement. Her calcium is normal. Problem #8 anasarca: Patient presently on Lasix and metolazone combination. Patient 2000 mL of urine output. Problem #9 hypotension: Her blood pressure is in normal range Plan: 1] we'll continue with IV potassium. 2]  panel in the morning. 3] KCl 60 mEq every 6 hours.   LOS: 19 days   Darleene Cumpian S 11/18/2015,7:59 AM  [email or web address]

## 2015-11-18 NOTE — Care Management Note (Signed)
Case Management Note  Patient Details  Name: Deborah Murray MRN: [id number] Date of Birth: [birth year]   Additional Comments: Spoke with Dr. Reynaldo Minium, Medical Director about possible referral to LTAC. He approves of referral. [facility] both contacted about referral as per policy. Both agencies will review the patient's chart and will get back in touch with me tomorrow.  Ota Ebersole, Chauncey [place], RN 11/18/2015, 4:19 PM

## 2015-11-18 NOTE — Progress Notes (Signed)
    Subjective: Patient drowsy. Received 12.5 mg Phenergan oral around 0900. No narcotics since 1am. States she had 10 loose stool yesterday.   Objective: Vital signs in last 24 hours: Temp:  [97.5 F (36.4 C)-98.5 F (36.9 C)] 98.5 F (36.9 C) (08/07 0708) Pulse Rate:  [80-89] 86 (08/07 0708) Resp:  [18-20] 20 (08/07 0708) BP: (103-116)/(71-84) 110/84 (08/07 0708) SpO2:  [98 %-100 %] 100 % (08/07 0809) Weight:  [259 lb 12.8 oz (117.8 kg)] 259 lb 12.8 oz (117.8 kg) (08/07 0708) Last BM Date: 11/16/15 General:   Resting with eyes closed but awakens to verbal stimuli. Appears drowsy.  Head:  Normocephalic and atraumatic. Abdomen:  Bowel sounds present, soft, mild TTP diffusely, no rebound or guarding. Obese.  Extremities:  3 + pitting lower extremity edema, pedal edema  Neurologic:  Oriented X 4   Intake/Output from previous day: 08/06 0701 - 08/07 0700 In: 5811.3 [P.O.:1200; I.V.:2611.3] Out: 2000 [Urine:2000] Intake/Output this shift: Total I/O In: 10 [I.V.:10] Out: 200 [Urine:200]  Lab Results:  Recent Labs  11/18/15 0605  WBC 8.6  HGB 9.9*  HCT 30.3*  PLT 353   BMET  Recent Labs  11/16/15 0735 11/17/15 0911 11/18/15 0605  NA 134* 135 138  K 2.4* 2.3* 2.5*  CL 101 100* 98*  CO2 27 28 29   GLUCOSE 82 88 86  BUN 20 16 14   CREATININE 1.45* 1.40* 1.39*  CALCIUM 7.6* 7.4* 7.7*   LFT  Recent Labs  11/16/15 0735 11/17/15 0911 11/18/15 0605  ALBUMIN 2.3* 2.3* 2.3*     Studies/Results: Dg Abd Portable 1v  Result Date: 11/17/2015 CLINICAL DATA:  Abdominal pain.  Lumbar fusion. EXAM: PORTABLE ABDOMEN - 1 VIEW COMPARISON:  November 07, 2015 FINDINGS: The patient is status post fusion of L4-5. Previous cholecystectomy. Evaluation is limited due to patient body habitus but there is no evidence of obstruction. No free air, portal venous gas, or pneumatosis identified. IMPRESSION: No acute abnormalities identified. Electronically Signed   By: Dorise Bullion III  M.D   On: 11/17/2015 14:32    Assessment: 55 year old female admitted with severe/complicated Cdiff colitis and slow clinical improvement. Repeat CBC today with resolution of leukocytosis and drop in Hgb likely multifactorial without overt GI bleeding. Loose stools reported per patient but inconsistent documentation in epic when compared to patient report.   Abdominal pain: chronic but not worsened since admission. Abd xray reviewed yesterday. No concerning physical exam findings. If any worsening of abdominal pain would order CT.   Anasarca: multifactorial in setting of illness and limited mobility.    AKI: improving with nephrology following.  Nausea/vomiting: improved. Continue heart-health diet, lactose-free.   As of note, appears drowsy and lethargic at time of visit but did just receive phenergan orally. CBC reviewed and leukocytosis resolved. Hgb 9.9 but without any GI bleeding. Abdominal exam at baseline. Would closely monitor for any significant changes. I feel this is more related to recent medication administration than an acute change in her status.    Plan: Change phenergan to as needed (discontinue around the clock dosing) Continue probiotic  Continue Vancomycin, with taper started 11/15/15    Orvil Feil, ANP-BC [facility]     LOS: 19 days    11/18/2015, 11:06 AM

## 2015-11-18 NOTE — Care Management Important Message (Signed)
Important Message  Patient Details  Name: ANABELL CHOLICO MRN: [id number] Date of Birth: 01/31/1961   Medicare Important Message Given:  Yes    Kaymon Denomme, Chauncey [place], RN 11/18/2015, 3:51 PM

## 2015-11-19 ENCOUNTER — Telehealth: Payer: Self-pay | Admitting: Gastroenterology

## 2015-11-19 ENCOUNTER — Inpatient Hospital Stay
Admission: RE | Admit: 2015-11-19 | Discharge: 2015-12-09 | Disposition: A | Discharge status: Home Health | Payer: Self-pay | Source: Other Acute Inpatient Hospital | Attending: Internal Medicine | Admitting: Internal Medicine

## 2015-11-19 DIAGNOSIS — R601 Generalized edema: Secondary | ICD-10-CM

## 2015-11-19 DIAGNOSIS — R109 Unspecified abdominal pain: Secondary | ICD-10-CM

## 2015-11-19 DIAGNOSIS — Z789 Other specified health status: Secondary | ICD-10-CM

## 2015-11-19 LAB — RENAL FUNCTION PANEL
ALBUMIN: 2.3 g/dL — AB (ref 3.5–5.0)
ANION GAP: 11 (ref 5–15)
BUN: 12 mg/dL (ref 6–20)
CALCIUM: 7.8 mg/dL — AB (ref 8.9–10.3)
CO2: 29 mmol/L (ref 22–32)
Chloride: 99 mmol/L — ABNORMAL LOW (ref 101–111)
Creatinine, Ser: 1.28 mg/dL — ABNORMAL HIGH (ref 0.44–1.00)
GFR, EST AFRICAN AMERICAN: 53 mL/min — AB (ref >60)
GFR, EST NON AFRICAN AMERICAN: 45 mL/min — AB (ref >60)
GLUCOSE: 78 mg/dL (ref 65–99)
PHOSPHORUS: 2.9 mg/dL (ref 2.5–4.6)
Potassium: 2.4 mmol/L — CL (ref 3.5–5.1)
SODIUM: 139 mmol/L (ref 135–145)

## 2015-11-19 LAB — COMPREHENSIVE METABOLIC PANEL
ALT: 10 U/L — ABNORMAL LOW (ref 14–54)
AST: 17 U/L (ref 15–41)
Albumin: 2.4 g/dL — ABNORMAL LOW (ref 3.5–5.0)
Alkaline Phosphatase: 52 U/L (ref 38–126)
Anion gap: 7 (ref 5–15)
BUN: 12 mg/dL (ref 6–20)
CHLORIDE: 101 mmol/L (ref 101–111)
CO2: 28 mmol/L (ref 22–32)
Calcium: 7.4 mg/dL — ABNORMAL LOW (ref 8.9–10.3)
Creatinine, Ser: 1.29 mg/dL — ABNORMAL HIGH (ref 0.44–1.00)
GFR calc Af Amer: 52 mL/min — ABNORMAL LOW (ref >60)
GFR, EST NON AFRICAN AMERICAN: 45 mL/min — AB (ref >60)
Glucose, Bld: 82 mg/dL (ref 65–99)
POTASSIUM: 2.7 mmol/L — AB (ref 3.5–5.1)
SODIUM: 136 mmol/L (ref 135–145)
Total Bilirubin: 0.5 mg/dL (ref 0.3–1.2)
Total Protein: 4.4 g/dL — ABNORMAL LOW (ref 6.5–8.1)

## 2015-11-19 MED ORDER — POTASSIUM CHLORIDE CRYS ER 20 MEQ PO TBCR
40.0000 meq | EXTENDED_RELEASE_TABLET | Freq: Once | ORAL | Status: AC
  Administered 2015-11-19: 40 meq via ORAL
  Filled 2015-11-19: qty 2

## 2015-11-19 MED ORDER — VANCOMYCIN 50 MG/ML ORAL SOLUTION: ORAL | Status: DC

## 2015-11-19 MED ORDER — FUROSEMIDE 10 MG/ML IJ SOLN: 120.0000 mg | Freq: Two times a day (BID) | INTRAVENOUS | Status: DC

## 2015-11-19 MED ORDER — SPIRONOLACTONE 25 MG PO TABS
50.0000 mg | ORAL_TABLET | Freq: Every day | ORAL | Status: DC
  Administered 2015-11-19: 50 mg via ORAL
  Filled 2015-11-19: qty 2

## 2015-11-19 MED ORDER — PRO-STAT SUGAR FREE PO LIQD: 30.0000 mL | Freq: Two times a day (BID) | ORAL | 0 refills | Status: DC

## 2015-11-19 MED ORDER — BOOST / RESOURCE BREEZE PO LIQD: 1.0000 | Freq: Three times a day (TID) | ORAL | 0 refills | Status: DC

## 2015-11-19 MED ORDER — POTASSIUM CHLORIDE 2 MEQ/ML IV SOLN
INTRAVENOUS | Status: DC
  Administered 2015-11-19: 12:00:00 via INTRAVENOUS
  Filled 2015-11-19 (×3): qty 1000

## 2015-11-19 MED ORDER — HYDROMORPHONE HCL 2 MG PO TABS: 2.0000 mg | ORAL_TABLET | ORAL | Status: DC | PRN

## 2015-11-19 MED ORDER — POTASSIUM CHLORIDE CRYS ER 20 MEQ PO TBCR: 60.0000 meq | EXTENDED_RELEASE_TABLET | Freq: Four times a day (QID) | ORAL | Status: DC

## 2015-11-19 MED ORDER — METOLAZONE 5 MG PO TABS: 5.0000 mg | ORAL_TABLET | Freq: Two times a day (BID) | ORAL | Status: DC

## 2015-11-19 MED ORDER — SPIRONOLACTONE 50 MG PO TABS: 50.0000 mg | ORAL_TABLET | Freq: Every day | ORAL | Status: DC

## 2015-11-19 NOTE — Progress Notes (Signed)
Physical Therapy Treatment Patient Details Name: Deborah Murray MRN: [id number] DOB: 1960/10/02 Today's Date: 11/19/2015    History of Present Illness 55 yo F admitted 10/30/2015 due to persistent abdominal pain and diarrhea.  She underwent back surgery 2 weeks ago at [facility]  Dx: pancolitis due to C-diff.  PMH: obesity, DJD, severe asthma, allergies, HTN, chronic back pain, Afib, and recent back surgery, syncope and collapse, thyroid disease, vocal cord dysfunction, lumbar radiculopathy, adrenal abnormality, COPD, gastric bypass, cholecystectomy, R knee surgery.    PT Comments    Pt received sitting up in the chair, and was agreeable to PT tx.  Sister present.  Pt ambulated a short distance from the chair to the bathroom, but was not able to have a BM.  She stood at the bathroom sink with Min guard, and washed her hands.  She took a seated therapeutic recovery break to don isolation garments in preparation for gait in the hallway. Pt was able to increase gait distance to 70ft with RW.  Pt continues to demonstrate early fatigue with gait, and therefore, continue to recommend SNF.   Follow Up Recommendations  SNF     Equipment Recommendations  None recommended by PT    Recommendations for Other Services       Precautions / Restrictions Precautions Precautions: Back;Other (comment) Precaution Booklet Issued: No Precaution Comments: Recent spinal fusion 2 weeks prior to admission.  Restrictions Weight Bearing Restrictions: No    Mobility  Bed Mobility                  Transfers   Equipment used: Rolling walker (2 wheeled) Transfers: Sit to/from Stand Sit to Stand: Min guard Stand pivot transfers: Min guard       General transfer comment: Pt assisted into the bathroom due to feeling like she needed to have a BM, however did not have a BM - total A for peri-hygiene.   Ambulation/Gait Ambulation/Gait assistance: Min guard Ambulation Distance (Feet): 40 Feet Assistive  device: Rolling walker (2 wheeled) Gait Pattern/deviations: Wide base of support;Trunk flexed   Gait velocity interpretation: <1.8 ft/sec, indicative of risk for recurrent falls General Gait Details: Pt demonstrates increased lateral sway, and expressed feeling slightly dizzy after about 2ft - pt able to take a standing therapeutic recovery period, and then return to her room.    Stairs            Wheelchair Mobility    Modified Rankin (Stroke Patients Only)       Balance   Sitting-balance support: Bilateral upper extremity supported Sitting balance-Leahy Scale: Good     Standing balance support: Bilateral upper extremity supported Standing balance-Leahy Scale: Fair                      Cognition Arousal/Alertness: Awake/alert Behavior During Therapy: WFL for tasks assessed/performed Overall Cognitive Status: Within Functional Limits for tasks assessed                      Exercises      General Comments        Pertinent Vitals/Pain Pain Assessment: No/denies pain    Home Living                      Prior Function            PT Goals (current goals can now be found in the care plan section) Acute Rehab PT Goals Patient Stated Goal:  To go home PT Goal Formulation: With patient/family Time For Goal Achievement: 11/22/15 Potential to Achieve Goals: Good Progress towards PT goals: Progressing toward goals    Frequency  Min 5X/week    PT Plan Current plan remains appropriate    Co-evaluation             End of Session Equipment Utilized During Treatment: Gait belt Activity Tolerance: Patient tolerated treatment well Patient left: in chair;with call bell/phone within reach;with nursing/sitter in room     Time: 1300-1330 PT Time Calculation (min) (ACUTE ONLY): 30 min  Charges:  $Gait Training: 8-22 mins $Therapeutic Activity: 8-22 mins                    G Codes:      Beth Finesse Fielder, PT, DPT X: [phone]

## 2015-11-19 NOTE — Care Management Note (Signed)
Case Management Note  Patient Details  Name: Deborah Murray MRN: [id number] Date of Birth: [birth year]   Additional Comments: Patient is agreeable to transferring to a LTAC. [facility] has bed available and will come talk with patient today. [facility] is currently reviewing patient.  Lealon Vanputten, Chauncey Reading, RN 11/19/2015, 9:24 AM

## 2015-11-19 NOTE — Progress Notes (Signed)
Received call from dietary about patient requesting hot sauce and salt packets. Instructed to only give 1 packet hot sauce. No salt.   RD Trying to be fairly liberal to promote intake in setting of poor PO intake and malnutrition, but also wary of potential GI upset. Spoke with MD. He will speak with her regarding this.   Called diet back. Pt can order food items that are high in sodium, her chicken sandwich with bacon/hotdog, but no extra salt or spice packets.   Burtis Junes RD, LDN, CNSC Clinical Nutrition Pager: [phone] 11/19/2015 12:38 PM

## 2015-11-19 NOTE — Progress Notes (Addendum)
Signing off on patient. We will see patient in 6 weeks at [facility]. Vanc taper started on 11/15/15. Would recommend as follows in a rapid tapered fashion and ending with pulse dosing as to hopefully decrease risk of recurrence:  Starting 11/15/15: Vanc 250 mg QID for 7 days, Followed by Vanc 125 mg QID for 7 days, Followed by Vanc 125 mg BID for 7 days Followed by Vanc 125 mg daily every 3 days for a total of 10 doses and done.   Orvil Feil, ANP-BC [facility]    REVIEWED. AGREE. NO ADDITIONAL RECOMMENDATIONS.

## 2015-11-19 NOTE — Progress Notes (Signed)
Subjective: Interval History: Patient claims she is feeling much better. Her diarrhea has improved so is her abdominal pain. Patient is still with poor appetite and some nausea.  Objective: Vital signs in isn't requiring me I try always a st 24 hours: Temp:  [97.9 F (36.6 C)-98.9 F (37.2 C)] 98.9 F (37.2 C) (08/07 2221) Pulse Rate:  [77-83] 77 (08/07 2221) Resp:  [16-77] 77 (08/07 2221) BP: (97-114)/(64-86) 114/86 (08/07 2221) SpO2:  [96 %-100 %] 100 % (08/08 0803) Weight change:   Intake/Output from previous day: 08/07 0701 - 08/08 0700 In: 1949.5 [P.O.:240; I.V.:1647.5; IV Piggyback:62] Out: 1200 [Urine:1200] Intake/Output this shift: Total I/O In: 1800 [Other:1800] Out: -   Generally patient is alert and in no apparent distress Chest is clear to auscultation Heart exam revealed regular rate and rhythm no murmur Abdomen: Somewhat distended, mildly tender, no rebound tenderness and positive bowel sound Extremities 2-+ edema  Lab Results:  Recent Labs  11/18/15 0605  WBC 8.6  HGB 9.9*  HCT 30.3*  PLT 353   BMET:   Recent Labs  11/18/15 0605 11/19/15 0630  NA 138 139  K 2.5* 2.4*  CL 98* 99*  CO2 29 29  GLUCOSE 86 78  BUN 14 12  CREATININE 1.39* 1.28*  CALCIUM 7.7* 7.8*   No results for input(s): PTH in the last 72 hours. Iron Studies: No results for input(s): IRON, TIBC, TRANSFERRIN, FERRITIN in the last 72 hours.  Studies/Results: Dg Abd Portable 1v  Result Date: 11/17/2015 CLINICAL DATA:  Abdominal pain.  Lumbar fusion. EXAM: PORTABLE ABDOMEN - 1 VIEW COMPARISON:  November 07, 2015 FINDINGS: The patient is status post fusion of L4-5. Previous cholecystectomy. Evaluation is limited due to patient body habitus but there is no evidence of obstruction. No free air, portal venous gas, or pneumatosis identified. IMPRESSION: No acute abnormalities identified. Electronically Signed   By: Dorise Bullion III M.D   On: 11/17/2015 14:32    I have reviewed the  patient's current medications.  Assessment/Plan: Problem #1 acute kidney injury: Her Renal function continued to improve. Patient presently is not oliguric. Problem #2 . Pancolitis: Her diarrhea seems to be improving. Patient claims he is feeling much better. Problem #3 history of asthma: She is on inhalers. No difficulty in breathing Problem #4 low CO2: Respiratory alkalosis. Her CO2 is stable. Problem #5 hypophosphatemia: Her phosphorus is normal Problem #6 hypokalemia: Her potassium has remained low but improving. Patient on KCl 60 mEq by mouth every 4 hours and also she is getting potassium through IV. Her potassium however remains low. Problem #7 hypocalcemia on calcium supplement. Her calcium is normal. Problem #8 anasarca: Patient presently on Lasix and metolazone combination. Patient 1200 mL of urine output. Patient is still with significant anasarca. Problem #9 hypotension: Her blood pressure is in normal range Plan: 1] we'll change IV fluid to half normal saline with 60 mEq of KCl at 75 mL per hour 2] comprehensive metabolic panel in the morning. 3] continue with by mouth potassium 4] we'll use Aldactone 50 mg by mouth once a day   LOS: 20 days   Deborah Murray S 11/19/2015,8:44 AM  [email or web address]

## 2015-11-19 NOTE — Progress Notes (Signed)
Report called to [facility] nurse.  Patient picke dup by EMS and is being transported now.

## 2015-11-19 NOTE — Telephone Encounter (Signed)
Please arrange outpatient hospital follow-up for history of Cdiff with me in about 6 weeks. Thanks!

## 2015-11-19 NOTE — Progress Notes (Signed)
PROGRESS NOTE  Deborah Murray [id number] DOB: May 21, 1960 DOA: 10/30/2015 PCP: Sol Passer, MD  Brief Narrative: 55 year old woman with a hx of afib HTN, adrenal insufficiency and recent back surgery presented with complaints of abdominal pain and diarrhea. CT a/p revealed diffused edematous wall thickening compatible with pancolitis. C diff positive. Follow up CT revealed worsening pancolitis. Surgery and GI were consulted. Conservative management was pursued. Additionally, nephrology was consulted for acute kidney injury and anasarca. She continues to slowly improve but still has massive volume overload. C. difficile is stable. Plan transfer to long-term acute care for ongoing diuresis and management of hypokalemia.  Assessment/Plan: 1. Sepsis secondary to pancolitis secondary to C diff colitis (present on admission).Acute issues appear resolved. Minimal diarrhea. Continue vancomycin taper as below. 2. Anasarca secondary to aggressive hydration in the setting of sepsis.Excellent diuresis but continues to have significant volume overload. 3. Severe hypokalemia secondary to diuresis. Asymptomatic. Telemetry sinus rhythm. Continue repletion. 4. Acute kidney injury secondary to sepsis. Resolved.Renal function remains stable with aggressive diuresis. 5. Atrial fibrillation. Remains in sinus rhythm. Continue Xarelto.CHADS score 2.  6. Adrenal insufficiency. Treated with stress dose steroids during initial part of hospitalization. Continue chronic prednisone. 7. Severe malnutrition.  8. Morbid obesity.    Overall improvement.   Continue current medications.   Continue diuresis   [facility]    Starting 11/15/15: Vanc 250 mg QID for 7 days,  Followed by Vanc 125 mg QID for 7 days,  Followed by Vanc 125 mg BID for 7 days  Followed by Vanc 125 mg daily every 3 days for a total of 10 doses and done.   Follow-up with GI in 6 weeks  DVT prophylaxis:  Xarelto Code Status: Full Family Communication: Family bedside  Disposition Plan: Discharge to long term acute care facility.   Murray Hodgkins, MD  [facility] Direct contact: [phone] --Via amion app OR  --[email or web address]; password [email or web address]  7PM-7AM contact night coverage as above 11/19/2015, 6:07 AM  LOS: 20 days   Consultants:  GI  Nutrition   General surgery  Nephrology  PT  Procedures:  None   Antimicrobials:  Vanc 7/23 >>  Flagyl7/19 >>7/30  HPI/Subjective: Feels better. States she still has mild pain. Eating and drinking well. Denies diarrhea.   Objective: Vitals:   11/18/15 1625 11/18/15 1942 11/18/15 1943 11/18/15 2221  BP: 97/64   114/86  Pulse: 83  78 77  Resp: 20  16 (!) 77  Temp: 97.9 F (36.6 C)   98.9 F (37.2 C)  TempSrc: Oral   Oral  SpO2: 100% 97% 96% 100%  Weight:      Height:        Intake/Output Summary (Last 24 hours) at 11/19/15 0607 Last data filed at 11/19/15 0250  Gross per 24 hour  Intake           1949.5 ml  Output             1200 ml  Net            749.5 ml     Filed Weights   11/15/15 0520 11/16/15 0559 11/18/15 0708  Weight: (!) 140 kg (308 lb 10.3 oz) (!) 142 kg (313 lb 0.9 oz) 117.8 kg (259 lb 12.8 oz)    Exam:    Constitutional:  . Appears calm and comfortable Respiratory:  . CTA bilaterally, no w/r/r.  . Respiratory effort normal. No retractions or accessory muscle use Cardiovascular:  .  RRR, no m/r/g . 3-4+ bilateral LE extremity edema   Abdomen:  . Abdomen appears soft, non distended; no tenderness or masses  I have personally reviewed following labs and imaging studies:  Potassium 2.7  Albumin 2.4  Scheduled Meds: . budesonide  0.5 mg Nebulization BID  . citalopram  20 mg Oral QHS  . DULoxetine  30 mg Oral Daily  . feeding supplement  1 Container Oral TID BM  . feeding supplement (PRO-STAT SUGAR FREE 64)  30 mL Oral BID  . furosemide  120 mg Intravenous BID  . gabapentin  600 mg  Oral TID  . latanoprost  1 drop Both Eyes QHS  . loratadine  10 mg Oral Daily  . metolazone  5 mg Oral BID  . montelukast  10 mg Oral QHS  . nystatin  5 mL Oral QID  . potassium chloride  60 mEq Oral QID  . predniSONE  15 mg Oral Q breakfast  . rivaroxaban  20 mg Oral Q supper  . saccharomyces boulardii  250 mg Oral BID  . sodium chloride flush  10-40 mL Intracatheter Q12H  . tiotropium  1 capsule Inhalation Daily  . [START ON 11/30/2015] vancomycin  125 mg Oral Q6H  . vancomycin  250 mg Oral Q6H   Continuous Infusions: . 0.9 % NaCl with KCl 40 mEq / L 75 mL/hr (11/19/15 0250)    Principal Problem:   Pancolitis (HCC) Active Problems:   Hypokalemia   Essential hypertension   Severe persistent asthma   Morbid obesity (HCC)   C. difficile diarrhea   Abdominal pain in female   Hypotension   History of atrial fibrillation- NSR now   History of adrenal insufficiency   History of hypertension   C. difficile colitis   Metabolic acidosis   Hypocalcemia   Acute kidney injury ([place])   Hyponatremia   Sepsis ([place])   AKI (acute kidney injury) ([place])   Pyrexia   Protein-calorie malnutrition, severe   Generalized abdominal pain   Colitis   Anasarca   LOS: 20 days    By signing my name below, I, Collene Leyden, attest that this documentation has been prepared under the direction and in the presence of Murray Hodgkins, MD. Electronically signed: Collene Leyden, [place] 11/19/15  12:35 PM     I personally performed the services described in this documentation. All medical record entries made by the scribe were at my direction. I have reviewed the chart and agree that the record reflects my personal performance and is accurate and complete. Murray Hodgkins, MD

## 2015-11-19 NOTE — Progress Notes (Addendum)
Nutrition Follow-up  DOCUMENTATION CODES:  Severe malnutrition in context of acute illness/injury, Morbid obesity  INTERVENTION:  Pt is requesting 2% milk and cereal for breakfast. She does not like the lactaid. If diarrhea thought to be unrelated to diet, Consider removal of lactose free restriction- Anticipate she will return to lactose containing products as soon as discharged. The 2% milk would also serve as additional protein source for wound healing/repletion  Continue 30 mL Prostat BID, each supplement provides 100 kcal and 15 grams of protein.  Can continue, Boost Breeze po TID, each supplement provides 250 kcal and 9 grams of protein, but most often refuses  NUTRITION DIAGNOSIS:  Inadequate oral intake related to nausea, acute illness and diarrhea evidenced by an estimated energy intake that met < or equal to 50% of needs for > or equal to 5 days  ongoing  GOAL:  Patient will meet greater than or equal to 90% of their needs   -not met  MONITOR:  PO intake, Supplement acceptance, Diet advancement, Labs, Weight trends, I & O's   ASSESSMENT:  55 y/o female history obesity, DJD, allergies, HTN, HLD, chronic pain, afib, recent back surgery. Presents with 2 week history of abdominal pain and diarrhea which began after recent back surgery. CT in ED showed diffuse pancolitis. Admitted for management. Work up is positive for CDIFF  Interval history 7/25: Pt's diet advanced to [facility] on 7/25. She became acidotic, UTI, aki.  Transferred to ICU  7/27: Diet advanced to soft on 7/25, had ~1 days of fair-good meal intake on 7/25. Patient moved to [facility]. Stool output decreased. Feels better overall 8/3: Diet advanced to [facility], lactose free on 7/31. PO intake is still poor with only consuming >50% of 2 meals for last week and majority of meal intake </=25%. Weight has continued to increase, anasarca. Diarrhea much improved.   8/8: Diarrhea waxing and waning, anasarca is resolving, 299->259 lbs x 3  days. Poor appetite and PO intake continues.   Today, pt reports her diarrhea is resolving, though apparently yesterday she reported to continue to have diarrhea. She is seen today with a large assortment of foods laid out on her tray, including foods that have been brought by friend; a can of meat is sitting on her tray.   She reports improvement in her PO intake. She has been able to order much of what she desires. However, she does not like the breakfast foods and does not like the lactaid milk. She is requesting 2% milk  Two new wounds noted.   Labs reviewed: Hypokalemic( on lasix ), CBGs are  lower, likely poor intake playing role, in addition to decreased stress+lower steroid dose/taper. BUN/Creat improving.   Meds: Lasix, phenergan before meals, Vanc, Probiotic. Prednisone, spirinolactone   Recent Labs Lab 11/15/15 0639  11/17/15 0911 11/18/15 0605 11/19/15 0630  NA 136  136  < > 135 138 136  139  K 2.4*  2.4*  < > 2.3* 2.5* 2.7*  2.4*  CL 102  101  < > 100* 98* 101  99*  CO2 27  27  < > _0 BUN 22*  22*  < > _1 CREATININE 1.42*  1.41*  < > 1.40* 1.39* 1.29*  1.28*  CALCIUM 7.9*  8.0*  < > 7.4* 7.7* 7.4*  7.8*  MG 1.9  --  2.0  --   --   PHOS 4.7*  < > 3.5 3.3 2.9  GLUCOSE 105*  104*  < > 88 86 82  78  < > = values in this interval not displayed. Diet Order:  Diet Heart Room service appropriate? Yes; Fluid consistency: Thin  Skin: MSAD perineum, multiple blisters, Dehisced/open back wound,  PU stage 2 to Left and right leg, PU stage 2 to left buttocks  Last BM:  8/7-diarrhea  Height:  Ht Readings from Last 1 Encounters:  10/30/15 _0  (1.575 m)   Weight:  Wt Readings from Last 1 Encounters:  11/18/15 259 lb 12.8 oz (117.8 kg)   Wt Readings from Last 10 Encounters:  11/18/15 259 lb 12.8 oz (117.8 kg)  09/17/15 247 lb (112 kg)  08/28/15 247 lb 12.8 oz (112.4 kg)  08/26/15 240 lb (108.9 kg)  06/21/15 253 lb (114.8 kg)   06/07/15 236 lb (107 kg)  10/30/14 260 lb (117.9 kg)  09/11/14 262 lb 5.6 oz (119 kg)  05/13/14 257 lb 9.6 oz (116.8 kg)  11/26/12 235 lb (106.6 kg)  Dosing weight: 246 lbs-admit weight  Ideal Body Weight:  50 kg  BMI:  Body mass index is 47.52 kg/m.  Estimated Nutritional Needs:  Kcal:  1550-1750 kcals  Protein:  65-75 g Pro (1.3-1.5 g/kg IBW) Fluid:  >1.6 liters fluid + enough to replace stool losses  EDUCATION NEEDS:  No education needs identified at this time  Burtis Junes RD, LDN, [facility] Pager: [phone] 11/19/2015 10:21 AM

## 2015-11-20 ENCOUNTER — Other Ambulatory Visit (HOSPITAL_COMMUNITY): Payer: Self-pay

## 2015-11-20 LAB — COMPREHENSIVE METABOLIC PANEL
ALT: 10 U/L — ABNORMAL LOW (ref 14–54)
AST: 17 U/L (ref 15–41)
Albumin: 2.2 g/dL — ABNORMAL LOW (ref 3.5–5.0)
Alkaline Phosphatase: 54 U/L (ref 38–126)
Anion gap: 10 (ref 5–15)
BILIRUBIN TOTAL: 0.6 mg/dL (ref 0.3–1.2)
BUN: 8 mg/dL (ref 6–20)
CALCIUM: 8.1 mg/dL — AB (ref 8.9–10.3)
CHLORIDE: 97 mmol/L — AB (ref 101–111)
CO2: 31 mmol/L (ref 22–32)
CREATININE: 1.27 mg/dL — AB (ref 0.44–1.00)
GFR, EST AFRICAN AMERICAN: 53 mL/min — AB (ref >60)
GFR, EST NON AFRICAN AMERICAN: 46 mL/min — AB (ref >60)
Glucose, Bld: 74 mg/dL (ref 65–99)
Potassium: 2.5 mmol/L — CL (ref 3.5–5.1)
Sodium: 138 mmol/L (ref 135–145)
Total Protein: 4 g/dL — ABNORMAL LOW (ref 6.5–8.1)

## 2015-11-20 LAB — CBC
HCT: 27.9 % — ABNORMAL LOW (ref 36.0–46.0)
Hemoglobin: 8.8 g/dL — ABNORMAL LOW (ref 12.0–15.0)
MCH: 28.4 pg (ref 26.0–34.0)
MCHC: 31.5 g/dL (ref 30.0–36.0)
MCV: 90 fL (ref 78.0–100.0)
Platelets: 281 10*3/uL (ref 150–400)
RBC: 3.1 MIL/uL — AB (ref 3.87–5.11)
RDW: 17 % — ABNORMAL HIGH (ref 11.5–15.5)
WBC: 6.9 10*3/uL (ref 4.0–10.5)

## 2015-11-20 LAB — PROTIME-INR
INR: 1.32
PROTHROMBIN TIME: 16.5 s — AB (ref 11.4–15.2)

## 2015-11-21 LAB — POTASSIUM: Potassium: 3.3 mmol/L — ABNORMAL LOW (ref 3.5–5.1)

## 2015-11-22 LAB — BASIC METABOLIC PANEL
ANION GAP: 10 (ref 5–15)
BUN: 8 mg/dL (ref 6–20)
CALCIUM: 8.5 mg/dL — AB (ref 8.9–10.3)
CHLORIDE: 96 mmol/L — AB (ref 101–111)
CO2: 32 mmol/L (ref 22–32)
Creatinine, Ser: 1.29 mg/dL — ABNORMAL HIGH (ref 0.44–1.00)
GFR calc Af Amer: 52 mL/min — ABNORMAL LOW (ref >60)
GFR calc non Af Amer: 45 mL/min — ABNORMAL LOW (ref >60)
GLUCOSE: 83 mg/dL (ref 65–99)
POTASSIUM: 3.4 mmol/L — AB (ref 3.5–5.1)
Sodium: 138 mmol/L (ref 135–145)

## 2015-11-23 LAB — BASIC METABOLIC PANEL
Anion gap: 10 (ref 5–15)
BUN: 8 mg/dL (ref 6–20)
CHLORIDE: 93 mmol/L — AB (ref 101–111)
CO2: 33 mmol/L — ABNORMAL HIGH (ref 22–32)
CREATININE: 1.41 mg/dL — AB (ref 0.44–1.00)
Calcium: 8.4 mg/dL — ABNORMAL LOW (ref 8.9–10.3)
GFR calc Af Amer: 47 mL/min — ABNORMAL LOW (ref >60)
GFR calc non Af Amer: 40 mL/min — ABNORMAL LOW (ref >60)
GLUCOSE: 77 mg/dL (ref 65–99)
Potassium: 3 mmol/L — ABNORMAL LOW (ref 3.5–5.1)
SODIUM: 136 mmol/L (ref 135–145)

## 2015-11-24 LAB — POTASSIUM: Potassium: 3.1 mmol/L — ABNORMAL LOW (ref 3.5–5.1)

## 2015-11-25 LAB — COMPREHENSIVE METABOLIC PANEL
ALK PHOS: 60 U/L (ref 38–126)
ALT: 12 U/L — AB (ref 14–54)
AST: 16 U/L (ref 15–41)
Albumin: 2.6 g/dL — ABNORMAL LOW (ref 3.5–5.0)
Anion gap: 7 (ref 5–15)
BUN: 8 mg/dL (ref 6–20)
CALCIUM: 8.4 mg/dL — AB (ref 8.9–10.3)
CO2: 32 mmol/L (ref 22–32)
CREATININE: 1.39 mg/dL — AB (ref 0.44–1.00)
Chloride: 98 mmol/L — ABNORMAL LOW (ref 101–111)
GFR calc non Af Amer: 41 mL/min — ABNORMAL LOW (ref >60)
GFR, EST AFRICAN AMERICAN: 48 mL/min — AB (ref >60)
GLUCOSE: 80 mg/dL (ref 65–99)
Potassium: 3.3 mmol/L — ABNORMAL LOW (ref 3.5–5.1)
SODIUM: 137 mmol/L (ref 135–145)
Total Bilirubin: 0.3 mg/dL (ref 0.3–1.2)
Total Protein: 4.4 g/dL — ABNORMAL LOW (ref 6.5–8.1)

## 2015-11-25 LAB — MAGNESIUM: Magnesium: 1.8 mg/dL (ref 1.7–2.4)

## 2015-11-26 ENCOUNTER — Other Ambulatory Visit (HOSPITAL_COMMUNITY): Payer: Self-pay

## 2015-11-26 LAB — CBC
HEMATOCRIT: 27.3 % — AB (ref 36.0–46.0)
HEMOGLOBIN: 8.4 g/dL — AB (ref 12.0–15.0)
MCH: 28 pg (ref 26.0–34.0)
MCHC: 30.8 g/dL (ref 30.0–36.0)
MCV: 91 fL (ref 78.0–100.0)
PLATELETS: 328 10*3/uL (ref 150–400)
RBC: 3 MIL/uL — AB (ref 3.87–5.11)
RDW: 16.4 % — ABNORMAL HIGH (ref 11.5–15.5)
WBC: 6.6 10*3/uL (ref 4.0–10.5)

## 2015-11-26 LAB — COMPREHENSIVE METABOLIC PANEL
ALT: 13 U/L — ABNORMAL LOW (ref 14–54)
ANION GAP: 6 (ref 5–15)
AST: 18 U/L (ref 15–41)
Albumin: 2.7 g/dL — ABNORMAL LOW (ref 3.5–5.0)
Alkaline Phosphatase: 62 U/L (ref 38–126)
BUN: 9 mg/dL (ref 6–20)
CHLORIDE: 101 mmol/L (ref 101–111)
CO2: 30 mmol/L (ref 22–32)
Calcium: 8.8 mg/dL — ABNORMAL LOW (ref 8.9–10.3)
Creatinine, Ser: 1.38 mg/dL — ABNORMAL HIGH (ref 0.44–1.00)
GFR, EST AFRICAN AMERICAN: 48 mL/min — AB (ref >60)
GFR, EST NON AFRICAN AMERICAN: 42 mL/min — AB (ref >60)
Glucose, Bld: 86 mg/dL (ref 65–99)
POTASSIUM: 3.6 mmol/L (ref 3.5–5.1)
Sodium: 137 mmol/L (ref 135–145)
Total Bilirubin: 0.2 mg/dL — ABNORMAL LOW (ref 0.3–1.2)
Total Protein: 4.6 g/dL — ABNORMAL LOW (ref 6.5–8.1)

## 2015-11-26 LAB — MAGNESIUM: Magnesium: 1.9 mg/dL (ref 1.7–2.4)

## 2015-11-26 MED ORDER — DIATRIZOATE MEGLUMINE & SODIUM 66-10 % PO SOLN
ORAL | Status: AC
  Filled 2015-11-26: qty 30

## 2015-11-26 MED ORDER — IOPAMIDOL (ISOVUE-300) INJECTION 61%
100.0000 mL | Freq: Once | INTRAVENOUS | Status: AC | PRN
  Administered 2015-11-26: 100 mL via INTRAVENOUS

## 2015-11-28 ENCOUNTER — Ambulatory Visit: Payer: Medicare Other | Admitting: Nurse Practitioner

## 2015-11-29 LAB — BASIC METABOLIC PANEL
Anion gap: 8 (ref 5–15)
BUN: 9 mg/dL (ref 6–20)
CHLORIDE: 104 mmol/L (ref 101–111)
CO2: 27 mmol/L (ref 22–32)
Calcium: 8.5 mg/dL — ABNORMAL LOW (ref 8.9–10.3)
Creatinine, Ser: 1.19 mg/dL — ABNORMAL HIGH (ref 0.44–1.00)
GFR calc Af Amer: 58 mL/min — ABNORMAL LOW (ref >60)
GFR calc non Af Amer: 50 mL/min — ABNORMAL LOW (ref >60)
GLUCOSE: 77 mg/dL (ref 65–99)
POTASSIUM: 4.1 mmol/L (ref 3.5–5.1)
Sodium: 139 mmol/L (ref 135–145)

## 2015-11-29 LAB — CBC
HCT: 28.9 % — ABNORMAL LOW (ref 36.0–46.0)
HEMOGLOBIN: 8.9 g/dL — AB (ref 12.0–15.0)
MCH: 28.5 pg (ref 26.0–34.0)
MCHC: 30.8 g/dL (ref 30.0–36.0)
MCV: 92.6 fL (ref 78.0–100.0)
PLATELETS: 400 10*3/uL (ref 150–400)
RBC: 3.12 MIL/uL — AB (ref 3.87–5.11)
RDW: 16.7 % — ABNORMAL HIGH (ref 11.5–15.5)
WBC: 7.4 10*3/uL (ref 4.0–10.5)

## 2015-12-04 LAB — BASIC METABOLIC PANEL
ANION GAP: 6 (ref 5–15)
BUN: 19 mg/dL (ref 6–20)
CALCIUM: 9 mg/dL (ref 8.9–10.3)
CO2: 28 mmol/L (ref 22–32)
Chloride: 104 mmol/L (ref 101–111)
Creatinine, Ser: 1.42 mg/dL — ABNORMAL HIGH (ref 0.44–1.00)
GFR calc non Af Amer: 40 mL/min — ABNORMAL LOW (ref >60)
GFR, EST AFRICAN AMERICAN: 47 mL/min — AB (ref >60)
GLUCOSE: 80 mg/dL (ref 65–99)
POTASSIUM: 4.8 mmol/L (ref 3.5–5.1)
Sodium: 138 mmol/L (ref 135–145)

## 2015-12-06 LAB — BASIC METABOLIC PANEL
ANION GAP: 11 (ref 5–15)
BUN: 22 mg/dL — ABNORMAL HIGH (ref 6–20)
CALCIUM: 9.1 mg/dL (ref 8.9–10.3)
CO2: 24 mmol/L (ref 22–32)
Chloride: 103 mmol/L (ref 101–111)
Creatinine, Ser: 1.4 mg/dL — ABNORMAL HIGH (ref 0.44–1.00)
GFR, EST AFRICAN AMERICAN: 47 mL/min — AB (ref >60)
GFR, EST NON AFRICAN AMERICAN: 41 mL/min — AB (ref >60)
GLUCOSE: 68 mg/dL (ref 65–99)
POTASSIUM: 4.3 mmol/L (ref 3.5–5.1)
Sodium: 138 mmol/L (ref 135–145)

## 2015-12-09 LAB — BASIC METABOLIC PANEL
ANION GAP: 8 (ref 5–15)
BUN: 20 mg/dL (ref 6–20)
CO2: 27 mmol/L (ref 22–32)
Calcium: 8.7 mg/dL — ABNORMAL LOW (ref 8.9–10.3)
Chloride: 105 mmol/L (ref 101–111)
Creatinine, Ser: 1.05 mg/dL — ABNORMAL HIGH (ref 0.44–1.00)
GFR calc Af Amer: >60 mL/min (ref >60)
GFR, EST NON AFRICAN AMERICAN: 58 mL/min — AB (ref >60)
GLUCOSE: 82 mg/dL (ref 65–99)
POTASSIUM: 4.2 mmol/L (ref 3.5–5.1)
Sodium: 140 mmol/L (ref 135–145)

## 2016-01-01 ENCOUNTER — Ambulatory Visit: Payer: Medicare Other | Admitting: Gastroenterology

## 2016-01-01 ENCOUNTER — Encounter: Payer: Self-pay | Admitting: Gastroenterology

## 2016-01-01 ENCOUNTER — Telehealth: Payer: Self-pay | Admitting: Gastroenterology

## 2016-01-01 NOTE — Telephone Encounter (Signed)
PT WAS A NO SHOW AND LETTER SENT  °

## 2016-01-06 ENCOUNTER — Encounter (HOSPITAL_COMMUNITY): Payer: Self-pay | Admitting: Cardiology

## 2016-01-06 ENCOUNTER — Inpatient Hospital Stay (HOSPITAL_COMMUNITY)
Admission: EM | Admit: 2016-01-06 | Discharge: 2016-01-16 | DRG: 372 | Disposition: A | Discharge status: Home Health | Payer: Medicare Other | Attending: Internal Medicine | Admitting: Internal Medicine

## 2016-01-06 DIAGNOSIS — Z8639 Personal history of other endocrine, nutritional and metabolic disease: Secondary | ICD-10-CM

## 2016-01-06 DIAGNOSIS — I10 Essential (primary) hypertension: Secondary | ICD-10-CM | POA: Diagnosis present

## 2016-01-06 DIAGNOSIS — E872 Acidosis, unspecified: Secondary | ICD-10-CM | POA: Diagnosis present

## 2016-01-06 DIAGNOSIS — Z90710 Acquired absence of both cervix and uterus: Secondary | ICD-10-CM

## 2016-01-06 DIAGNOSIS — Z880 Allergy status to penicillin: Secondary | ICD-10-CM

## 2016-01-06 DIAGNOSIS — G4733 Obstructive sleep apnea (adult) (pediatric): Secondary | ICD-10-CM | POA: Diagnosis present

## 2016-01-06 DIAGNOSIS — Z7951 Long term (current) use of inhaled steroids: Secondary | ICD-10-CM | POA: Diagnosis not present

## 2016-01-06 DIAGNOSIS — Z9989 Dependence on other enabling machines and devices: Secondary | ICD-10-CM

## 2016-01-06 DIAGNOSIS — G8929 Other chronic pain: Secondary | ICD-10-CM | POA: Diagnosis present

## 2016-01-06 DIAGNOSIS — A047 Enterocolitis due to Clostridium difficile: Secondary | ICD-10-CM | POA: Diagnosis not present

## 2016-01-06 DIAGNOSIS — I4891 Unspecified atrial fibrillation: Secondary | ICD-10-CM | POA: Diagnosis present

## 2016-01-06 DIAGNOSIS — Z79899 Other long term (current) drug therapy: Secondary | ICD-10-CM | POA: Diagnosis not present

## 2016-01-06 DIAGNOSIS — Z8679 Personal history of other diseases of the circulatory system: Secondary | ICD-10-CM | POA: Diagnosis not present

## 2016-01-06 DIAGNOSIS — J449 Chronic obstructive pulmonary disease, unspecified: Secondary | ICD-10-CM | POA: Diagnosis present

## 2016-01-06 DIAGNOSIS — M6281 Muscle weakness (generalized): Secondary | ICD-10-CM

## 2016-01-06 DIAGNOSIS — M545 Low back pain: Secondary | ICD-10-CM | POA: Diagnosis present

## 2016-01-06 DIAGNOSIS — A0471 Enterocolitis due to Clostridium difficile, recurrent: Principal | ICD-10-CM | POA: Diagnosis present

## 2016-01-06 DIAGNOSIS — R296 Repeated falls: Secondary | ICD-10-CM | POA: Diagnosis present

## 2016-01-06 DIAGNOSIS — Z8249 Family history of ischemic heart disease and other diseases of the circulatory system: Secondary | ICD-10-CM

## 2016-01-06 DIAGNOSIS — E2740 Unspecified adrenocortical insufficiency: Secondary | ICD-10-CM | POA: Diagnosis present

## 2016-01-06 DIAGNOSIS — A0472 Enterocolitis due to Clostridium difficile, not specified as recurrent: Secondary | ICD-10-CM | POA: Diagnosis present

## 2016-01-06 DIAGNOSIS — Z7901 Long term (current) use of anticoagulants: Secondary | ICD-10-CM | POA: Diagnosis not present

## 2016-01-06 DIAGNOSIS — Z886 Allergy status to analgesic agent status: Secondary | ICD-10-CM | POA: Diagnosis not present

## 2016-01-06 DIAGNOSIS — E860 Dehydration: Secondary | ICD-10-CM | POA: Diagnosis present

## 2016-01-06 DIAGNOSIS — Z9049 Acquired absence of other specified parts of digestive tract: Secondary | ICD-10-CM | POA: Diagnosis not present

## 2016-01-06 DIAGNOSIS — N179 Acute kidney failure, unspecified: Secondary | ICD-10-CM | POA: Diagnosis present

## 2016-01-06 DIAGNOSIS — Z9884 Bariatric surgery status: Secondary | ICD-10-CM

## 2016-01-06 DIAGNOSIS — Z825 Family history of asthma and other chronic lower respiratory diseases: Secondary | ICD-10-CM

## 2016-01-06 DIAGNOSIS — K529 Noninfective gastroenteritis and colitis, unspecified: Secondary | ICD-10-CM | POA: Diagnosis present

## 2016-01-06 DIAGNOSIS — Z23 Encounter for immunization: Secondary | ICD-10-CM

## 2016-01-06 DIAGNOSIS — R109 Unspecified abdominal pain: Secondary | ICD-10-CM

## 2016-01-06 DIAGNOSIS — E876 Hypokalemia: Secondary | ICD-10-CM | POA: Diagnosis present

## 2016-01-06 DIAGNOSIS — R140 Abdominal distension (gaseous): Secondary | ICD-10-CM

## 2016-01-06 DIAGNOSIS — E871 Hypo-osmolality and hyponatremia: Secondary | ICD-10-CM | POA: Diagnosis present

## 2016-01-06 DIAGNOSIS — R41841 Cognitive communication deficit: Secondary | ICD-10-CM

## 2016-01-06 DIAGNOSIS — K668 Other specified disorders of peritoneum: Secondary | ICD-10-CM

## 2016-01-06 HISTORY — DX: Unspecified glaucoma: H40.9

## 2016-01-06 LAB — CBC
HCT: 41.4 % (ref 36.0–46.0)
Hemoglobin: 13.8 g/dL (ref 12.0–15.0)
MCH: 29.8 pg (ref 26.0–34.0)
MCHC: 33.3 g/dL (ref 30.0–36.0)
MCV: 89.4 fL (ref 78.0–100.0)
PLATELETS: 357 10*3/uL (ref 150–400)
RBC: 4.63 MIL/uL (ref 3.87–5.11)
RDW: 14.9 % (ref 11.5–15.5)
WBC: 15.9 10*3/uL — AB (ref 4.0–10.5)

## 2016-01-06 LAB — COMPREHENSIVE METABOLIC PANEL
ALT: 14 U/L (ref 14–54)
AST: 27 U/L (ref 15–41)
Albumin: 2.7 g/dL — ABNORMAL LOW (ref 3.5–5.0)
Alkaline Phosphatase: 85 U/L (ref 38–126)
Anion gap: 7 (ref 5–15)
BILIRUBIN TOTAL: 0.4 mg/dL (ref 0.3–1.2)
BUN: 39 mg/dL — AB (ref 6–20)
CO2: 17 mmol/L — ABNORMAL LOW (ref 22–32)
CREATININE: 3.01 mg/dL — AB (ref 0.44–1.00)
Calcium: 8.1 mg/dL — ABNORMAL LOW (ref 8.9–10.3)
Chloride: 106 mmol/L (ref 101–111)
GFR calc Af Amer: 19 mL/min — ABNORMAL LOW (ref >60)
GFR, EST NON AFRICAN AMERICAN: 16 mL/min — AB (ref >60)
Glucose, Bld: 116 mg/dL — ABNORMAL HIGH (ref 65–99)
Potassium: 3.7 mmol/L (ref 3.5–5.1)
Sodium: 130 mmol/L — ABNORMAL LOW (ref 135–145)
TOTAL PROTEIN: 6.2 g/dL — AB (ref 6.5–8.1)

## 2016-01-06 LAB — C DIFFICILE QUICK SCREEN W PCR REFLEX
C DIFFICILE (CDIFF) INTERP: DETECTED
C DIFFICILE (CDIFF) TOXIN: POSITIVE — AB
C DIFFICLE (CDIFF) ANTIGEN: POSITIVE — AB

## 2016-01-06 LAB — LIPASE, BLOOD: Lipase: <10 U/L — ABNORMAL LOW (ref 11–51)

## 2016-01-06 MED ORDER — VANCOMYCIN 50 MG/ML ORAL SOLUTION: 125.0000 mg | Freq: Every day | ORAL | Status: DC

## 2016-01-06 MED ORDER — ACETAMINOPHEN 325 MG PO TABS
650.0000 mg | ORAL_TABLET | Freq: Four times a day (QID) | ORAL | Status: DC | PRN
  Administered 2016-01-07: 650 mg via ORAL
  Filled 2016-01-06: qty 2

## 2016-01-06 MED ORDER — VANCOMYCIN 50 MG/ML ORAL SOLUTION: 125.0000 mg | ORAL | Status: DC

## 2016-01-06 MED ORDER — POTASSIUM CHLORIDE IN NACL 20-0.9 MEQ/L-% IV SOLN
INTRAVENOUS | Status: DC
  Administered 2016-01-06: 20:00:00 via INTRAVENOUS

## 2016-01-06 MED ORDER — VANCOMYCIN HCL IN DEXTROSE 1-5 GM/200ML-% IV SOLN
1000.0000 mg | Freq: Once | INTRAVENOUS | Status: AC
  Administered 2016-01-06: 1000 mg via INTRAVENOUS
  Filled 2016-01-06: qty 200

## 2016-01-06 MED ORDER — ALBUTEROL SULFATE (2.5 MG/3ML) 0.083% IN NEBU: 2.5000 mg | INHALATION_SOLUTION | RESPIRATORY_TRACT | Status: DC | PRN

## 2016-01-06 MED ORDER — CITALOPRAM HYDROBROMIDE 20 MG PO TABS
20.0000 mg | ORAL_TABLET | Freq: Every day | ORAL | Status: DC
  Administered 2016-01-06 – 2016-01-15 (×10): 20 mg via ORAL
  Filled 2016-01-06 (×9): qty 1

## 2016-01-06 MED ORDER — LATANOPROST 0.005 % OP SOLN
1.0000 [drp] | Freq: Every day | OPHTHALMIC | Status: DC
  Administered 2016-01-06 – 2016-01-15 (×9): 1 [drp] via OPHTHALMIC
  Filled 2016-01-06: qty 2.5

## 2016-01-06 MED ORDER — BRIMONIDINE TARTRATE 0.2 % OP SOLN
OPHTHALMIC | Status: AC
  Filled 2016-01-06: qty 5

## 2016-01-06 MED ORDER — TIOTROPIUM BROMIDE MONOHYDRATE 18 MCG IN CAPS
1.0000 | ORAL_CAPSULE | Freq: Every day | RESPIRATORY_TRACT | Status: DC
  Administered 2016-01-07 – 2016-01-15 (×9): 18 ug via RESPIRATORY_TRACT
  Filled 2016-01-06 (×2): qty 5

## 2016-01-06 MED ORDER — ONDANSETRON HCL 4 MG/2ML IJ SOLN
4.0000 mg | Freq: Four times a day (QID) | INTRAMUSCULAR | Status: DC | PRN
Start: 2016-01-06 — End: 2016-01-16
  Administered 2016-01-08 – 2016-01-16 (×7): 4 mg via INTRAVENOUS
  Filled 2016-01-06 (×7): qty 2

## 2016-01-06 MED ORDER — BOOST / RESOURCE BREEZE PO LIQD
1.0000 | Freq: Three times a day (TID) | ORAL | Status: DC
  Administered 2016-01-06 – 2016-01-11 (×6): 1 via ORAL

## 2016-01-06 MED ORDER — ATORVASTATIN CALCIUM 20 MG PO TABS
20.0000 mg | ORAL_TABLET | Freq: Every evening | ORAL | Status: DC
  Administered 2016-01-06 – 2016-01-15 (×9): 20 mg via ORAL
  Filled 2016-01-06 (×10): qty 1

## 2016-01-06 MED ORDER — CYCLOBENZAPRINE HCL 10 MG PO TABS
10.0000 mg | ORAL_TABLET | Freq: Three times a day (TID) | ORAL | Status: DC | PRN
  Administered 2016-01-06 – 2016-01-15 (×8): 10 mg via ORAL
  Filled 2016-01-06 (×8): qty 1

## 2016-01-06 MED ORDER — SACCHAROMYCES BOULARDII 250 MG PO CAPS
250.0000 mg | ORAL_CAPSULE | Freq: Two times a day (BID) | ORAL | Status: DC
Start: 2016-01-06 — End: 2016-01-16
  Administered 2016-01-06 – 2016-01-16 (×19): 250 mg via ORAL
  Filled 2016-01-06 (×19): qty 1

## 2016-01-06 MED ORDER — RIVAROXABAN 15 MG PO TABS
15.0000 mg | ORAL_TABLET | Freq: Every day | ORAL | Status: DC
  Administered 2016-01-06 – 2016-01-09 (×4): 15 mg via ORAL
  Filled 2016-01-06 (×4): qty 1

## 2016-01-06 MED ORDER — VANCOMYCIN 50 MG/ML ORAL SOLUTION: 125.0000 mg | Freq: Two times a day (BID) | ORAL | Status: DC

## 2016-01-06 MED ORDER — CALCIUM CARBONATE-VITAMIN D 500-200 MG-UNIT PO TABS
1.0000 | ORAL_TABLET | Freq: Every day | ORAL | Status: DC
  Administered 2016-01-07 – 2016-01-16 (×10): 1 via ORAL
  Filled 2016-01-06 (×10): qty 1

## 2016-01-06 MED ORDER — ONDANSETRON HCL 4 MG PO TABS
4.0000 mg | ORAL_TABLET | Freq: Four times a day (QID) | ORAL | Status: DC | PRN
  Administered 2016-01-11 – 2016-01-14 (×4): 4 mg via ORAL
  Filled 2016-01-06 (×4): qty 1

## 2016-01-06 MED ORDER — OXYCODONE HCL 5 MG PO TABS
5.0000 mg | ORAL_TABLET | Freq: Four times a day (QID) | ORAL | Status: DC | PRN
  Administered 2016-01-06 – 2016-01-12 (×15): 5 mg via ORAL
  Filled 2016-01-06 (×15): qty 1

## 2016-01-06 MED ORDER — MONTELUKAST SODIUM 10 MG PO TABS
10.0000 mg | ORAL_TABLET | Freq: Every day | ORAL | Status: DC
  Administered 2016-01-06 – 2016-01-15 (×10): 10 mg via ORAL
  Filled 2016-01-06 (×10): qty 1

## 2016-01-06 MED ORDER — INFLUENZA VAC SPLIT QUAD 0.5 ML IM SUSY
0.5000 mL | PREFILLED_SYRINGE | INTRAMUSCULAR | Status: DC
  Filled 2016-01-06: qty 0.5

## 2016-01-06 MED ORDER — SODIUM CHLORIDE 0.9% FLUSH
3.0000 mL | Freq: Two times a day (BID) | INTRAVENOUS | Status: DC
  Administered 2016-01-06 – 2016-01-16 (×15): 3 mL via INTRAVENOUS

## 2016-01-06 MED ORDER — BRIMONIDINE TARTRATE 0.2 % OP SOLN
1.0000 [drp] | Freq: Two times a day (BID) | OPHTHALMIC | Status: DC
  Administered 2016-01-06 – 2016-01-16 (×20): 1 [drp] via OPHTHALMIC
  Filled 2016-01-06: qty 5

## 2016-01-06 MED ORDER — SODIUM CHLORIDE 0.9 % IV BOLUS (SEPSIS)
1000.0000 mL | Freq: Once | INTRAVENOUS | Status: AC
Start: 2016-01-06 — End: 2016-01-06
  Administered 2016-01-06: 1000 mL via INTRAVENOUS

## 2016-01-06 MED ORDER — BUDESONIDE 0.5 MG/2ML IN SUSP
1.0000 mg | Freq: Two times a day (BID) | RESPIRATORY_TRACT | Status: DC
  Administered 2016-01-06 – 2016-01-09 (×7): 1 mg via RESPIRATORY_TRACT
  Administered 2016-01-10 – 2016-01-11 (×3): 0.5 mg via RESPIRATORY_TRACT
  Administered 2016-01-11: 1 mg via RESPIRATORY_TRACT
  Administered 2016-01-12: 0.5 mg via RESPIRATORY_TRACT
  Administered 2016-01-12 – 2016-01-16 (×8): 1 mg via RESPIRATORY_TRACT
  Filled 2016-01-06 (×21): qty 4

## 2016-01-06 MED ORDER — TIOTROPIUM BROMIDE MONOHYDRATE 18 MCG IN CAPS
ORAL_CAPSULE | RESPIRATORY_TRACT | Status: AC
  Filled 2016-01-06: qty 5

## 2016-01-06 MED ORDER — PNEUMOCOCCAL VAC POLYVALENT 25 MCG/0.5ML IJ INJ
0.5000 mL | INJECTION | INTRAMUSCULAR | Status: AC
  Administered 2016-01-07: 0.5 mL via INTRAMUSCULAR
  Filled 2016-01-06: qty 0.5

## 2016-01-06 MED ORDER — PREDNISONE 10 MG PO TABS
15.0000 mg | ORAL_TABLET | Freq: Every day | ORAL | Status: DC
  Administered 2016-01-07 – 2016-01-16 (×10): 15 mg via ORAL
  Filled 2016-01-06 (×10): qty 2

## 2016-01-06 MED ORDER — VANCOMYCIN 50 MG/ML ORAL SOLUTION
ORAL | Status: AC
  Filled 2016-01-06: qty 5

## 2016-01-06 MED ORDER — FLUTICASONE PROPIONATE HFA 220 MCG/ACT IN AERO: 2.0000 | INHALATION_SPRAY | RESPIRATORY_TRACT | Status: DC

## 2016-01-06 MED ORDER — VANCOMYCIN 50 MG/ML ORAL SOLUTION
125.0000 mg | Freq: Four times a day (QID) | ORAL | Status: DC
  Administered 2016-01-06 – 2016-01-16 (×37): 125 mg via ORAL
  Filled 2016-01-06 (×43): qty 2.5

## 2016-01-06 MED ORDER — LATANOPROST 0.005 % OP SOLN
OPHTHALMIC | Status: AC
  Filled 2016-01-06: qty 2.5

## 2016-01-06 MED ORDER — GABAPENTIN 300 MG PO CAPS
600.0000 mg | ORAL_CAPSULE | Freq: Three times a day (TID) | ORAL | Status: DC
  Administered 2016-01-06 – 2016-01-16 (×27): 600 mg via ORAL
  Filled 2016-01-06 (×28): qty 2

## 2016-01-06 MED ORDER — ACETAMINOPHEN 650 MG RE SUPP: 650.0000 mg | Freq: Four times a day (QID) | RECTAL | Status: DC | PRN

## 2016-01-06 MED ORDER — OXYCODONE-ACETAMINOPHEN 5-325 MG PO TABS
1.0000 | ORAL_TABLET | Freq: Once | ORAL | Status: AC
  Administered 2016-01-06: 1 via ORAL
  Filled 2016-01-06: qty 1

## 2016-01-06 NOTE — ED Notes (Signed)
Report given to [facility]

## 2016-01-06 NOTE — ED Provider Notes (Signed)
[facility] DEPT Provider Note   CSN: [id number] Arrival date & time: 01/06/16  1150  By signing my name below, I, Rayna Sexton, attest that this documentation has been prepared under the direction and in the presence of Milton Ferguson, MD. Electronically Signed: Rayna Sexton, ED Scribe. 01/06/16. 12:11 PM.   History   Chief Complaint Chief Complaint  Patient presents with  . Diarrhea    HPI HPI Comments: Deborah Murray is a 55 y.o. female who presents to the Emergency Department complaining of constant, moderate, watery diarrhea x 4 days. She reports associated abd pain and lower back pain. Pt states she was treated for c-diff last month with vancomycin which she took for two weeks and finished about two weeks ago. She states her current diarrhea is different than her symptoms from her prior c-diff. No other associated symptoms at this time.    The history is provided by the patient. No language interpreter was used.  Diarrhea   This is a new problem. The current episode started more than 2 days ago. The problem occurs 5 to 10 times per day. The problem has not changed since onset.The stool consistency is described as watery. There has been no fever. Associated symptoms include abdominal pain. Pertinent negatives include no headaches and no cough. She has tried nothing for the symptoms. The treatment provided no relief. Past medical history comments: c-diff.    Past Medical History:  Diagnosis Date  . Adrenal abnormality ([place])   . Adrenal insufficiency ([place]) 2013   Tx at [facility]  . Asthma   . Atrial fibrillation ([place])   . Chronic back pain   . COPD (chronic obstructive pulmonary disease) ([place])   . Headache   . Hyperlipidemia   . Hypertension   . Lumbar radiculopathy   . Sepsis ([place]) 11/04/2015  . Syncope and collapse 05/11/11  adrenal insuffiency  treated at [facility]  . Thyroid disease   . Vocal cord dysfunction     Patient Active Problem List   Diagnosis Date Noted  .  Anasarca 11/18/2015  . Colitis   . Generalized abdominal pain   . Protein-calorie malnutrition, severe 11/05/2015  . Sepsis ([place]) 11/04/2015  . AKI (acute kidney injury) ([place])   . Pyrexia   . Acute kidney injury ([place]) 11/02/2015  . Hyponatremia 11/02/2015  . Metabolic acidosis [id number]  . Hypocalcemia 11/01/2015  . C. difficile colitis 10/31/2015  . Pancolitis ([place]) 10/30/2015  . C. difficile diarrhea 10/30/2015  . Abdominal pain in female 10/30/2015  . Hypotension 10/30/2015  . History of atrial fibrillation- NSR now 10/30/2015  . History of adrenal insufficiency 10/30/2015  . History of hypertension 10/30/2015  . Esophageal reflux   . Nausea   . GERD (gastroesophageal reflux disease) 08/28/2015  . Nausea without vomiting 08/28/2015  . Abdominal pain, epigastric 08/28/2015  . Dark stools 08/28/2015  . Atrial fibrillation with RVR ([place]) 05/12/2014  . Hypokalemia 05/12/2014  . Essential hypertension 05/12/2014  . Severe persistent asthma 05/12/2014  . OSA on CPAP 05/12/2014  . Toxic nodular goiter 05/12/2014  . Morbid obesity ([place]) 05/12/2014  . Pain in joint, shoulder region 08/02/2013  . Muscle weakness (generalized) 08/02/2013  . Decreased range of motion of left shoulder 08/02/2013  . Tight fascia 08/02/2013    Past Surgical History:  Procedure Laterality Date  . ABDOMINAL HYSTERECTOMY    . BACK SURGERY     disc   . CARDIAC CATHETERIZATION  2012 at [facility]  . CHOLECYSTECTOMY    .  CHOLECYSTECTOMY, LAPAROSCOPIC    . ESOPHAGOGASTRODUODENOSCOPY N/A 09/17/2015   Procedure: ESOPHAGOGASTRODUODENOSCOPY (EGD);  Surgeon: Daneil Dolin, MD;  Location: [facility] SU[facility];  Service: Endoscopy;  Laterality: [id number]  . eye surg for glaucoma    . gastic by-pass  05-11-11   Gastric by-pass in 2008  . right knee surg      OB History    No data available       Home Medications    Prior to Admission medications   Medication Sig Start Date End Date Taking? Authorizing  Provider  albuterol (PROVENTIL HFA;VENTOLIN HFA) 108 (90 BASE) MCG/ACT inhaler Inhale 2 puffs into the lungs every 4 (four) hours as needed for wheezing.     Historical Provider, MD  aluminum chloride (DRYSOL) 20 % external solution Apply 1 application topically at bedtime as needed (for sweating and applies under arms).     Historical Provider, MD  Amino Acids-Protein Hydrolys (FEEDING SUPPLEMENT, PRO-STAT SUGAR FREE 64,) LIQD Take 30 mLs by mouth 2 (two) times daily. 11/19/15   Samuella Cota, MD  atorvastatin (LIPITOR) 20 MG tablet Take 20 mg by mouth every evening.     Historical Provider, MD  calcium citrate (CALCITRATE - DOSED IN MG ELEMENTAL CALCIUM) 950 MG tablet Take 1 tablet by mouth daily.    Historical Provider, MD  Cholecalciferol (VITAMIN D) 2000 UNITS tablet Take 2,000 Units by mouth daily.    Historical Provider, MD  citalopram (CELEXA) 20 MG tablet Take 20 mg by mouth at bedtime.     Historical Provider, MD  clindamycin (CLEOCIN T) 1 % external solution Apply 1 application topically every evening. Applies to underarms 11/08/09   Historical Provider, MD  DULoxetine (CYMBALTA) 30 MG capsule Take 30 mg by mouth daily.  08/10/15   Historical Provider, MD  EPINEPHrine (EPIPEN JR) 0.15 MG/0.3ML injection Inject 0.15 mg into the muscle Once PRN. 06/11/15   Historical Provider, MD  feeding supplement (BOOST / RESOURCE BREEZE) LIQD Take 1 Container by mouth 3 (three) times daily between meals. 11/19/15   Samuella Cota, MD  fluticasone Asencion Islam) 50 MCG/ACT nasal spray Place 2 sprays in each nostril daily as needed for allergies 06/12/15   Historical Provider, MD  fluticasone (FLOVENT HFA) 220 MCG/ACT inhaler Inhale 2 puffs into the lungs every 4 (four) hours. 06/24/09   Historical Provider, MD  furosemide 120 mg in dextrose 5 % 50 mL Inject 120 mg into the vein 2 (two) times daily. 11/19/15   Samuella Cota, MD  gabapentin (NEURONTIN) 300 MG capsule Take 900 mg by mouth 3 (three) times daily.      Historical Provider, MD  HYDROmorphone (DILAUDID) 2 MG tablet Take 1 tablet (2 mg total) by mouth every 4 (four) hours as needed for severe pain. 11/19/15   Samuella Cota, MD  latanoprost (XALATAN) 0.005 % ophthalmic solution Place 1 drop into both eyes at bedtime.    Historical Provider, MD  levocetirizine (XYZAL) 5 MG tablet Take 5 mg by mouth every evening. 06/04/15   Historical Provider, MD  metolazone (ZAROXOLYN) 5 MG tablet Take 1 tablet (5 mg total) by mouth 2 (two) times daily. 11/19/15   Samuella Cota, MD  montelukast (SINGULAIR) 10 MG tablet Take 10 mg by mouth at bedtime.    Historical Provider, MD  Multiple Vitamin (MULTIVITAMIN) capsule Take 1 capsule by mouth daily.    Historical Provider, MD  nitroGLYCERIN (NITROSTAT) 0.4 MG SL tablet Place 0.4 mg under the tongue every  5 (five) minutes as needed for chest pain.    Historical Provider, MD  potassium chloride SA (K-DUR,KLOR-CON) 20 MEQ tablet Take 3 tablets (60 mEq total) by mouth 4 (four) times daily. 11/19/15   Samuella Cota, MD  predniSONE (DELTASONE) 10 MG tablet Take 10 mg by mouth daily.  07/19/15   Historical Provider, MD  rivaroxaban (XARELTO) 20 MG TABS tablet Take 1 tablet (20 mg total) by mouth daily with supper. 05/13/14   Modena Jansky, MD  spironolactone (ALDACTONE) 50 MG tablet Take 1 tablet (50 mg total) by mouth daily. 11/19/15   Samuella Cota, MD  tiotropium (SPIRIVA HANDIHALER) 18 MCG inhalation capsule Place 1 capsule into inhaler and inhale daily. 12/09/09   Historical Provider, MD  vancomycin (VANCOCIN) 50 mg/mL oral solution Starting 11/15/15: Vanc 250 mg QID for 7 days, Followed by Vanc 125 mg QID for 7 days, Followed by Vanc 125 mg BID for 7 days Followed by Vanc 125 mg daily every 3 days for a total of 10 doses and done. 11/19/15   Samuella Cota, MD    Family History Family History  Problem Relation Age of Onset  . Heart attack Mother   . Hypertension Mother   . Heart attack Father   . Asthma  Father   . Hyperlipidemia Father   . Hypertension Father   . Heart attack Sister   . Arrhythmia Sister   . Asthma Sister   . Hyperlipidemia Sister   . Hypertension Sister   . Asthma Brother   . Hypertension Brother     Social History Social History  Substance Use Topics  . Smoking status: Never Smoker  . Smokeless tobacco: Never Used  . Alcohol use No     Allergies   Aspirin; Codeine sulfate; Darvocet [propoxyphene n-acetaminophen]; Tramadol; and Penicillins   Review of Systems Review of Systems  Constitutional: Negative for appetite change and fatigue.  HENT: Negative for congestion, ear discharge and sinus pressure.   Eyes: Negative for discharge.  Respiratory: Negative for cough.   Cardiovascular: Negative for chest pain.  Gastrointestinal: Positive for abdominal pain and diarrhea.  Genitourinary: Negative for frequency and hematuria.  Musculoskeletal: Positive for back pain.  Skin: Negative for rash.  Neurological: Negative for seizures and headaches.  Psychiatric/Behavioral: Negative for hallucinations.   Physical Exam Updated Vital Signs BP 109/84   Pulse 115   Temp 99.6 F (37.6 C) (Oral)   Resp 16   Ht 5\' 6"  (1.676 m)   Wt 220 lb (99.8 kg)   SpO2 100%   BMI 35.51 kg/m   Physical Exam  Constitutional: She is oriented to person, place, and time. She appears well-developed.  HENT:  Head: Normocephalic.  Eyes: Conjunctivae and EOM are normal. No scleral icterus.  Neck: Neck supple. No thyromegaly present.  Cardiovascular: Normal rate and regular rhythm.  Exam reveals no gallop and no friction rub.   No murmur heard. Pulmonary/Chest: No stridor. She has no wheezes. She has no rales. She exhibits no tenderness.  Abdominal: Soft. She exhibits no distension. There is tenderness. There is no rebound.  Diffuse abd tenderness   Musculoskeletal: Normal range of motion. She exhibits no edema.  Lymphadenopathy:    She has no cervical adenopathy.    Neurological: She is oriented to person, place, and time. She exhibits normal muscle tone. Coordination normal.  Skin: No rash noted. No erythema.  Psychiatric: She has a normal mood and affect. Her behavior is normal.   ED Treatments /  Results  Labs (all labs ordered are listed, but only abnormal results are displayed) Labs Reviewed  LIPASE, BLOOD  COMPREHENSIVE METABOLIC PANEL  CBC  URINALYSIS, ROUTINE W REFLEX MICROSCOPIC (NOT AT [facility])    EKG  EKG Interpretation None       Radiology No results found.  Procedures Procedures  DIAGNOSTIC STUDIES: Oxygen Saturation is 100% on RA, normal by my interpretation.    COORDINATION OF CARE: 12:08 PM Discussed next steps with pt. Pt verbalized understanding and is agreeable with the plan.    Medications Ordered in ED Medications - No data to display   Initial Impression / Assessment and Plan / ED Course  I have reviewed the triage vital signs and the nursing notes.  Pertinent labs & imaging results that were available during my care of the patient were reviewed by me and considered in my medical decision making (see chart for details).  Clinical Course    Asa with C. difficile colitis. She will be admitted and given IV antibiotics  The chart was scribed for me under my direct supervision.  I personally performed the history, physical, and medical decision making and all procedures in the evaluation of this patient..  Final Clinical Impressions(s) / ED Diagnoses   Final diagnoses:  None    New Prescriptions New Prescriptions   No medications on file     Milton Ferguson, MD 01/06/16 1709

## 2016-01-06 NOTE — ED Notes (Signed)
Pt placed on bedside commode.  

## 2016-01-06 NOTE — H&P (Signed)
History and Physical    Deborah Murray [id number] DOB: 12/28/1960 DOA: 01/06/2016  PCP: Sol Passer, MD  Patient coming from: home  Chief Complaint: abdominal pain  HPI: Deborah Murray is a 55 y.o. female with medical history significant of asthma/COPD, adrenal insufficiency, atrial fibrillation, chronic back pain and recent admission for Clostridium difficile. Patient was discharged in the first week of August after being treated for Clostridium difficile and was sent home on a vancomycin taper per discharge summary. It is unclear whether patient appropriately took her vancomycin, since on questioning she reports she took 2 weeks of vancomycin after discharge. Patient reports that she was doing well after her discharge. Her diarrhea and abdominal pain had resolved. She was feeling better. On 9/21 the patient went to eat at [facility] and once she returned home began having diarrhea and abdominal pain. The symptoms persisted having up to 11 bowel movements every day. Stools are described as loose and watery. She denies any blood in her stools. She did have nausea, but no vomiting. Abdominal pain was across her entire abdomen. She is unsure whether she's had any fevers. Yesterday, her sister gave her 1 dose of Imodium which has somewhat improved her diarrhea, but her abdominal pain has persisted..  ED Course: In the emergency room she was noted to have low blood pressure. Labs revealed acute kidney injury with creatinine of 3. This was normal on her last discharge. Stool was tested for Clostridium difficile returned positive for antigen and toxin. She's been referred for admission.  Review of Systems: As per HPI otherwise 10 point review of systems negative.    Past Medical History:  Diagnosis Date  . Adrenal abnormality ([place])   . Adrenal insufficiency ([place]) 2013   Tx at [facility]  . Asthma   . Atrial fibrillation ([place])   . Chronic back pain   . COPD (chronic obstructive pulmonary  disease) ([place])   . Headache   . Hyperlipidemia   . Hypertension   . Lumbar radiculopathy   . Sepsis ([place]) 11/04/2015  . Syncope and collapse 05/11/11  adrenal insuffiency  treated at [facility]  . Thyroid disease   . Vocal cord dysfunction     Past Surgical History:  Procedure Laterality Date  . ABDOMINAL HYSTERECTOMY    . BACK SURGERY     disc   . CARDIAC CATHETERIZATION  2012 at [facility]  . CHOLECYSTECTOMY    . CHOLECYSTECTOMY, LAPAROSCOPIC    . ESOPHAGOGASTRODUODENOSCOPY N/A 09/17/2015   Procedure: ESOPHAGOGASTRODUODENOSCOPY (EGD);  Surgeon: Daneil Dolin, MD;  Location: [facility];  Service: Endoscopy;  Laterality: [id number]/[id number]  . eye surg for glaucoma    . gastic by-pass  05-11-11   Gastric by-pass in 2008  . right knee surg       reports that she has never smoked. She has never used smokeless tobacco. She reports that she does not drink alcohol or use drugs.  Allergies  Allergen Reactions  . Aspirin Shortness Of Breath and Palpitations  . Codeine Sulfate Shortness Of Breath and Palpitations  . Darvocet [Propoxyphene N-Acetaminophen] Shortness Of Breath and Palpitations  . Tramadol Shortness Of Breath  . Penicillins Other (See Comments)    Has patient had a PCN reaction causing immediate rash, facial/tongue/throat swelling, SOB or lightheadedness with hypotension: No Has patient had a PCN reaction causing severe rash involving mucus membranes or skin necrosis: No Has patient had a PCN reaction that required hospitalization No Has patient had a  PCN reaction occurring within the last 10 years: Yes If all of the above answers are "NO", then may proceed with Cephalosporin use.     Family History  Problem Relation Age of Onset  . Heart attack Mother   . Hypertension Mother   . Heart attack Father   . Asthma Father   . Hyperlipidemia Father   . Hypertension Father   . Heart attack Sister   . Arrhythmia Sister   . Asthma Sister   . Hyperlipidemia Sister   . Hypertension  Sister   . Asthma Brother   . Hypertension Brother      Prior to Admission medications   Medication Sig Start Date End Date Taking? Authorizing Provider  Amino Acids-Protein Hydrolys (FEEDING SUPPLEMENT, PRO-STAT SUGAR FREE 64,) LIQD Take 30 mLs by mouth 2 (two) times daily. 11/19/15  Yes Samuella Cota, MD  atorvastatin (LIPITOR) 20 MG tablet Take 20 mg by mouth every evening.    Yes Historical Provider, MD  brimonidine (ALPHAGAN) 0.2 % ophthalmic solution Place 1 drop into both eyes 2 (two) times daily. 09/05/15  Yes Historical Provider, MD  Calcium Carb-Ergocalciferol 500-200 MG-UNIT TABS Take 1 tablet by mouth daily.   Yes Historical Provider, MD  Cholecalciferol (VITAMIN D) 2000 UNITS tablet Take 2,000 Units by mouth daily.   Yes Historical Provider, MD  citalopram (CELEXA) 20 MG tablet Take 20 mg by mouth at bedtime.    Yes Historical Provider, MD  EPINEPHrine (EPIPEN JR) 0.15 MG/0.3ML injection Inject 0.15 mg into the muscle as needed. 06/11/15  Yes Historical Provider, MD  esomeprazole (NEXIUM) 40 MG capsule Take 40 mg by mouth 2 (two) times daily. 09/30/15  Yes Historical Provider, MD  FLORASTOR 250 MG capsule Take 250 mg by mouth 2 (two) times daily. 12/09/15  Yes Historical Provider, MD  furosemide (LASIX) 40 MG tablet Take 40 mg by mouth daily. 12/09/15  Yes Historical Provider, MD  gabapentin (NEURONTIN) 600 MG tablet Take 600 mg by mouth 3 (three) times daily. 12/09/15  Yes Historical Provider, MD  ipratropium (ATROVENT) 0.02 % nebulizer solution Take 2.5 mLs by nebulization 4 (four) times daily as needed for wheezing or shortness of breath.  06/16/15  Yes Historical Provider, MD  latanoprost (XALATAN) 0.005 % ophthalmic solution Place 1 drop into both eyes at bedtime.   Yes Historical Provider, MD  lisinopril (PRINIVIL,ZESTRIL) 10 MG tablet Take 10 mg by mouth daily. 10/21/15  Yes Historical Provider, MD  loratadine (CLARITIN) 10 MG tablet Take 10 mg by mouth daily. 12/09/15  Yes  Historical Provider, MD  Melatonin 3 MG TABS Take 3 mg by mouth at bedtime.   Yes Historical Provider, MD  montelukast (SINGULAIR) 10 MG tablet Take 10 mg by mouth at bedtime.   Yes Historical Provider, MD  Multiple Vitamin (MULTIVITAMIN) capsule Take 1 capsule by mouth daily.   Yes Historical Provider, MD  omalizumab Arvid Right) 150 MG injection Inject 300 mg into the skin every 30 (thirty) days. 06/11/15  Yes Historical Provider, MD  oxyCODONE (OXY IR/ROXICODONE) 5 MG immediate release tablet Take 5-10 mg by mouth every 4 (four) hours as needed. 12/27/15  Yes Historical Provider, MD  potassium chloride (MICRO-K) 10 MEQ CR capsule Take 10 mEq by mouth daily. 12/09/15  Yes Historical Provider, MD  potassium chloride SA (K-DUR,KLOR-CON) 20 MEQ tablet Take 3 tablets (60 mEq total) by mouth 4 (four) times daily. 11/19/15  Yes Samuella Cota, MD  predniSONE (DELTASONE) 5 MG tablet Take 15 mg by mouth  daily. 12/09/15  Yes Historical Provider, MD  rivaroxaban (XARELTO) 20 MG TABS tablet Take 1 tablet (20 mg total) by mouth daily with supper. 05/13/14  Yes Modena Jansky, MD  spironolactone (ALDACTONE) 25 MG tablet Take 25 mg by mouth daily. 12/09/15  Yes Historical Provider, MD  spironolactone (ALDACTONE) 50 MG tablet Take 1 tablet (50 mg total) by mouth daily. 11/19/15  Yes Samuella Cota, MD  tiotropium (SPIRIVA HANDIHALER) 18 MCG inhalation capsule Place 1 capsule into inhaler and inhale daily. 12/09/09  Yes Historical Provider, MD  tiZANidine (ZANAFLEX) 4 MG tablet Take 4 mg by mouth every 6 (six) hours as needed for muscle spasms.  12/27/15  Yes Historical Provider, MD  albuterol (PROVENTIL HFA;VENTOLIN HFA) 108 (90 BASE) MCG/ACT inhaler Inhale 2 puffs into the lungs every 4 (four) hours as needed for wheezing.     Historical Provider, MD  aluminum chloride (DRYSOL) 20 % external solution Apply 1 application topically at bedtime as needed (for sweating and applies under arms).     Historical Provider, MD    clindamycin (CLEOCIN T) 1 % external solution Apply 1 application topically every evening. Applies to underarms 11/08/09   Historical Provider, MD  cyclobenzaprine (FLEXERIL) 10 MG tablet Take 10 mg by mouth every 8 (eight) hours as needed. 10/21/15   Historical Provider, MD  fluticasone Asencion Islam) 50 MCG/ACT nasal spray Place 2 sprays in each nostril daily as needed for allergies 06/12/15   Historical Provider, MD  fluticasone (FLOVENT HFA) 220 MCG/ACT inhaler Inhale 2 puffs into the lungs every 4 (four) hours. 06/24/09   Historical Provider, MD  nitroGLYCERIN (NITROSTAT) 0.4 MG SL tablet Place 0.4 mg under the tongue every 5 (five) minutes as needed for chest pain.    Historical Provider, MD  polyethylene glycol (MIRALAX / GLYCOLAX) packet Take 17 g by mouth daily as needed for mild constipation.  10/21/15   Historical Provider, MD  promethazine (PHENERGAN) 25 MG tablet Take 25 mg by mouth every 4 (four) hours as needed for nausea or vomiting.  12/17/15   Historical Provider, MD    Physical Exam: Vitals:   01/06/16 1153 01/06/16 1230 01/06/16 1600  BP: 109/84 (!) 100/46 104/88  Pulse: 115 112 103  Resp: 16 18 18   Temp: 99.6 F (37.6 C)    TempSrc: Oral    SpO2: 100% 98% 97%  Weight: 99.8 kg (220 lb)    Height: 5\' 6"  (1.676 m)        Constitutional: NAD, calm, comfortable Vitals:   01/06/16 1153 01/06/16 1230 01/06/16 1600  BP: 109/84 (!) 100/46 104/88  Pulse: 115 112 103  Resp: 16 18 18   Temp: 99.6 F (37.6 C)    TempSrc: Oral    SpO2: 100% 98% 97%  Weight: 99.8 kg (220 lb)    Height: 5\' 6"  (1.676 m)     Eyes: PERRL, lids and conjunctivae normal ENMT: Mucous membranes are moist. Posterior pharynx clear of any exudate or lesions.Normal dentition.  Neck: normal, supple, no masses, no thyromegaly Respiratory: clear to auscultation bilaterally, no wheezing, no crackles. Normal respiratory effort. No accessory muscle use.  Cardiovascular: Regular rate and rhythm, no murmurs / rubs /  gallops. No extremity edema. 2+ pedal pulses. No carotid bruits.  Abdomen: diffuse tenderness, no masses palpated. No hepatosplenomegaly. Bowel sounds positive.  Musculoskeletal: no clubbing / cyanosis. No joint deformity upper and lower extremities. Good ROM, no contractures. Normal muscle tone.  Skin: no rashes, lesions, ulcers. No induration Neurologic: CN 2-12  grossly intact. Sensation intact, DTR normal. Strength 5/5 in all 4.  Psychiatric: Normal judgment and insight. Alert and oriented x 3. Normal mood.   Labs on Admission: I have personally reviewed following labs and imaging studies  CBC:  Recent Labs Lab 01/06/16 1201  WBC 15.9*  HGB 13.8  HCT 41.4  MCV 89.4  PLT [id number]   Basic Metabolic Panel:  Recent Labs Lab 01/06/16 1201  NA 130*  K 3.7  CL 106  CO2 17*  GLUCOSE 116*  BUN 39*  CREATININE 3.01*  CALCIUM 8.1*   GFR: Estimated Creatinine Clearance: 24.6 mL/min (by C-G formula based on SCr of 3.01 mg/dL (H)). Liver Function Tests:  Recent Labs Lab 01/06/16 1201  AST 27  ALT 14  ALKPHOS 85  BILITOT 0.4  PROT 6.2*  ALBUMIN 2.7*    Recent Labs Lab 01/06/16 1201  LIPASE <10*   No results for input(s): AMMONIA in the last 168 hours. Coagulation Profile: No results for input(s): INR, PROTIME in the last 168 hours. Cardiac Enzymes: No results for input(s): CKTOTAL, CKMB, CKMBINDEX, TROPONINI in the last 168 hours. BNP (last 3 results) No results for input(s): PROBNP in the last 8760 hours. HbA1C: No results for input(s): HGBA1C in the last 72 hours. CBG: No results for input(s): GLUCAP in the last 168 hours. Lipid Profile: No results for input(s): CHOL, HDL, LDLCALC, TRIG, CHOLHDL, LDLDIRECT in the last 72 hours. Thyroid Function Tests: No results for input(s): TSH, T4TOTAL, FREET4, T3FREE, THYROIDAB in the last 72 hours. Anemia Panel: No results for input(s): VITAMINB12, FOLATE, FERRITIN, TIBC, IRON, RETICCTPCT in the last 72 hours. Urine  analysis:    Component Value Date/Time   COLORURINE AMBER (A) 11/10/2015 1000   APPEARANCEUR HAZY (A) 11/10/2015 1000   LABSPEC 1.020 11/10/2015 1000   PHURINE 5.5 11/10/2015 1000   GLUCOSEU NEGATIVE 11/10/2015 1000   HGBUR MODERATE (A) 11/10/2015 1000   BILIRUBINUR NEGATIVE 11/10/2015 1000   KETONESUR NEGATIVE 11/10/2015 1000   PROTEINUR TRACE (A) 11/10/2015 1000   UROBILINOGEN 1.0 05/12/2014 1319   NITRITE NEGATIVE 11/10/2015 1000   LEUKOCYTESUR NEGATIVE 11/10/2015 1000   Sepsis Labs: !!!!!!!!!!!!!!!!!!!!!!!!!!!!!!!!!!!!!!!!!!!! @LABRCNTIP (procalcitonin:4,lacticidven:4) ) Recent Results (from the past 240 hour(s))  C difficile quick scan w PCR reflex     Status: Abnormal   Collection Time: 01/06/16 12:57 PM  Result Value Ref Range Status   C Diff antigen POSITIVE (A) NEGATIVE Final   C Diff toxin POSITIVE (A) NEGATIVE Final   C Diff interpretation Toxin producing C. difficile detected.  Final    Comment: CRITICAL RESULT CALLED TO, READ BACK BY AND VERIFIED WITH: ROBINSON,L. AT [id number] ON 01/06/2016 BY BAUGHAM,M.      Radiological Exams on Admission: No results found.  Assessment/Plan Active Problems:   Essential hypertension   OSA on CPAP   History of atrial fibrillation- NSR now   History of adrenal insufficiency   C. difficile colitis   Metabolic acidosis   Hyponatremia   AKI (acute kidney injury) ([place])   Colitis    1. Clostridium difficile colitis. Patient reportedly had an improvement with vancomycin on her last admission. Is unclear whether she appropriately completed her taper. Case discussed with Dr. Drucilla Schmidt on call for infectious disease recommendations were to restart her on vancomycin taper. We'll also start for start. She is having nausea and wishes to start on clear liquids. Advance diet to low residue as tolerated.  2. Acute kidney injury. Related to dehydration and persistent diarrhea. Started on IV fluids and  recheck labs in a.m. Monitor urine  output.  3. Metabolic acidosis. Related to persistent diarrhea. Continue to monitor with IV fluids  4. Hyponatremia. Related to volume depletion. Patient has been started on saline. Continue to monitor  5. History of adrenal insufficiency. Continue home dose of prednisone for now. If she becomes hemodynamically unstable, can consider stress dose steroids.  6. History of atrial fibrillation, currently in sinus rhythm. Heart rate is stable. Continue anticoagulation with Xarelto  7. Obstructive sleep apnea. Continue CPAP daily at bedtime     DVT prophylaxis: Xarelto Code Status: full code Family Communication: no family present Disposition Plan: discharge home once improved Consults called: Dr. Drucilla Schmidt (phone) Admission status: inpatient, telemetry    [facility] MD [facility] Pager [phone]  If 7PM-7AM, please contact night-coverage [email or web address] Password [facility]  01/06/2016, 5:59 PM

## 2016-01-06 NOTE — Progress Notes (Signed)
Patient does not want a CPAP for use tonight. Will call RT if she changes her mind. States that she wears hers at home sometimes.

## 2016-01-06 NOTE — ED Triage Notes (Signed)
Diarrhea since Thursday.  Treated for c-diff one month ago.  Lower back, abdominal and bilateral leg pain since Saturday.

## 2016-01-06 NOTE — ED Notes (Signed)
Vanc stopped per MD request.

## 2016-01-07 LAB — CBC
HCT: 41.8 % (ref 36.0–46.0)
HEMOGLOBIN: 13.7 g/dL (ref 12.0–15.0)
MCH: 28.7 pg (ref 26.0–34.0)
MCHC: 32.8 g/dL (ref 30.0–36.0)
MCV: 87.6 fL (ref 78.0–100.0)
Platelets: 353 10*3/uL (ref 150–400)
RBC: 4.77 MIL/uL (ref 3.87–5.11)
RDW: 14.8 % (ref 11.5–15.5)
WBC: 13.8 10*3/uL — AB (ref 4.0–10.5)

## 2016-01-07 LAB — BASIC METABOLIC PANEL
Anion gap: 10 (ref 5–15)
BUN: 39 mg/dL — ABNORMAL HIGH (ref 6–20)
CALCIUM: 7.6 mg/dL — AB (ref 8.9–10.3)
CHLORIDE: 107 mmol/L (ref 101–111)
CO2: 16 mmol/L — ABNORMAL LOW (ref 22–32)
CREATININE: 2.04 mg/dL — AB (ref 0.44–1.00)
GFR, EST AFRICAN AMERICAN: 30 mL/min — AB (ref >60)
GFR, EST NON AFRICAN AMERICAN: 26 mL/min — AB (ref >60)
Glucose, Bld: 106 mg/dL — ABNORMAL HIGH (ref 65–99)
POTASSIUM: 3.3 mmol/L — AB (ref 3.5–5.1)
SODIUM: 133 mmol/L — AB (ref 135–145)

## 2016-01-07 MED ORDER — POTASSIUM CHLORIDE CRYS ER 20 MEQ PO TBCR
40.0000 meq | EXTENDED_RELEASE_TABLET | Freq: Once | ORAL | Status: AC
  Administered 2016-01-07: 40 meq via ORAL
  Filled 2016-01-07: qty 2

## 2016-01-07 MED ORDER — SODIUM BICARBONATE 8.4 % IV SOLN
INTRAVENOUS | Status: DC
  Administered 2016-01-07 – 2016-01-08 (×4): via INTRAVENOUS
  Filled 2016-01-07 (×5): qty 1000

## 2016-01-07 NOTE — Progress Notes (Signed)
Initial Nutrition Assessment   INTERVENTION:  Advance diet as tolerated to Soft diet  Encourage Boost Breeze BID pending diet being fully advanced  Add ProStat 30 ml TID (each 30 ml provides 100 kcal, 15 gr protein)   MVI daily due to persistent poor appetite and likely micronutrient deficiencies  NUTRITION DIAGNOSIS:   Inadequate oral intake related to poor appetite as evidenced by meal completion < 50% and significant wt loss.  GOAL:   Patient will meet greater than or equal to 90% of their needs   MONITOR:   PO intake, Labs, Weight trends, I & O's  REASON FOR ASSESSMENT:  Malnutrition Screen      ASSESSMENT:  Patient is a 55 yo female with hx of gastric bypass  (2013), adrenal insufficiency, COPD and more recently c. Difficile. Pt says she has been having diarrhea she her meal at [employer] on last Thursday. She is drinking a coke during my visit and says she has been drinking soft drinks and water mostly since that meal.  She has hx of severe malnutrition which was identified back during her July hospitalization her wt on RD assessment 117.8 on 11/18/15. Her appetite and po intake was poor then. She developed anasarca as well.  Currently her weight is down 11% < 90 days which is significant and appetite and po continue to be poor.     Recent Labs Lab 01/06/16 1201 01/07/16 0610  NA 130* 133*  K 3.7 3.3*  CL 106 107  CO2 17* 16*  BUN 39* 39*  CREATININE 3.01* 2.04*  CALCIUM 8.1* 7.6*  GLUCOSE 116* 106*  Labs:sodium 133, BUN 39, Cr 2.04    Diet Order:  Diet clear liquid Room service appropriate? Yes; Fluid consistency: Thin  Skin:  Reviewed, no issues  Last BM:  9/26 (4) loose stools so far today per pt  Height:   Ht Readings from Last 1 Encounters:  01/06/16 5\' 6"  (1.676 m)    Weight:   Wt Readings from Last 1 Encounters:  01/06/16 220 lb 14.4 oz (100.2 kg)    Ideal Body Weight:  59 kg  BMI:  Body mass index is 35.65 kg/m.  Estimated Nutritional  Needs:   Kcal:  1500-1700  Protein:  85-95   Fluid:  >1.5 liters daily  EDUCATION NEEDS:   Education needs no appropriate at this time  Colman Cater MS,RD,CSG,LDN Office: [phone] Pager: [phone]

## 2016-01-07 NOTE — Progress Notes (Signed)
PROGRESS NOTE    Deborah Murray  [id number] DOB: 1960/08/28 DOA: 01/06/2016 PCP: Sol Passer, MD    Brief Narrative:  49 yof with a hx of asthma/COPD, adrenal insufficiency, atrial fibrillation, chronic back pain and recent admission for Clostridium difficile. Patient was discharged in the first week of August after being treated for Clostridium difficile and was sent home on a vancomycin taper per discharge summary. She was readmitted to the hospital with recurrence of diarrhea, dehydration and acute kidney injury. Stools have tested positive for Clostridium difficile. She's been admitted for further treatment.   Assessment & Plan:   Active Problems:   Essential hypertension   OSA on CPAP   History of atrial fibrillation- NSR now   History of adrenal insufficiency   C. difficile colitis   Metabolic acidosis   Hyponatremia   AKI (acute kidney injury) ([place])   Colitis 1. Clostridium difficile colitis. Patient reportedly had an improvement with vancomycin on her last admission. It is unclear whether she appropriately completed her taper. Case discussed with Dr. Drucilla Schmidt, on call for infectious disease. Recommendations were to restart her on vancomycin taper. She is not having vomiting. Will advance diet to low-residue. 2. Acute kidney injury. Related to dehydration and persistent diarrhea. Creatinine significantly improving, 2.04. Continue IV fluids. Monitor urine output.  3. Metabolic acidosis. Related to persistent diarrhea. Will change IV fluids to bicarbonate infusion.  4. Hyponatremia. Related to volume depletion. Improving with IV fluid. Continue to monitor 5. History of adrenal insufficiency. Continue home dose of prednisone for now. If she becomes hemodynamically unstable, can consider stress dose steroids. 6. History of atrial fibrillation, currently in sinus rhythm. Continue anticoagulation with Xarelto 7. Obstructive sleep apnea. Continue CPAP daily at bedtime  DVT  prophylaxis: Xarelto  Code Status: Full  Family Communication: No family bedside Disposition Plan: Discharge home once improved.    Consultants:   Infectious disease (phone)  Procedures:   None   Antimicrobials:   Vancomycin 9/26 >>    Subjective: Diarrhea is improving. Still has some abdominal discomfort. No vomiting.  Objective: Vitals:   01/06/16 1928 01/06/16 1934 01/06/16 2027 01/07/16 0413  BP: 112/88  110/87 102/83  Pulse: (!) 122  (!) 117 (!) 118  Resp: 18  18 18   Temp: 98.3 F (36.8 C)  98.2 F (36.8 C) 99.1 F (37.3 C)  TempSrc: Oral  Oral Oral  SpO2: 100% 96% 97% 99%  Weight: 100.2 kg (220 lb 14.4 oz)     Height: 5\' 6"  (1.676 m)       Intake/Output Summary (Last 24 hours) at 01/07/16 0725 Last data filed at 01/07/16 0200  Gross per 24 hour  Intake             1730 ml  Output                0 ml  Net             1730 ml   Filed Weights   01/06/16 1153 01/06/16 1928  Weight: 99.8 kg (220 lb) 100.2 kg (220 lb 14.4 oz)    Examination:  General exam: Appears calm and comfortable  Respiratory system: Clear to auscultation. Respiratory effort normal. Cardiovascular system: S1 & S2 heard, RRR. No JVD, murmurs, rubs, gallops or clicks.Trace pedal edema. Gastrointestinal system: Abdomen is nondistended, soft and diffuse abdominal tenderness. No organomegaly or masses felt. Normal bowel sounds heard. Central nervous system: Alert and oriented. No focal neurological deficits. Extremities: Symmetric 5 x  5 power. Skin: No rashes, lesions or ulcers Psychiatry: Judgement and insight appear normal. Mood & affect appropriate.     Data Reviewed: I have personally reviewed following labs and imaging studies  CBC:  Recent Labs Lab 01/06/16 1201 01/07/16 0610  WBC 15.9* 13.8*  HGB 13.8 13.7  HCT 41.4 41.8  MCV 89.4 87.6  PLT 357 0000000   Basic Metabolic Panel:  Recent Labs Lab 01/06/16 1201  NA 130*  K 3.7  CL 106  CO2 17*  GLUCOSE 116*  BUN  39*  CREATININE 3.01*  CALCIUM 8.1*   GFR: Estimated Creatinine Clearance: 24.6 mL/min (by C-G formula based on SCr of 3.01 mg/dL (H)). Liver Function Tests:  Recent Labs Lab 01/06/16 1201  AST 27  ALT 14  ALKPHOS 85  BILITOT 0.4  PROT 6.2*  ALBUMIN 2.7*    Recent Labs Lab 01/06/16 1201  LIPASE <10*   No results for input(s): AMMONIA in the last 168 hours. Coagulation Profile: No results for input(s): INR, PROTIME in the last 168 hours. Cardiac Enzymes: No results for input(s): CKTOTAL, CKMB, CKMBINDEX, TROPONINI in the last 168 hours. BNP (last 3 results) No results for input(s): PROBNP in the last 8760 hours. HbA1C: No results for input(s): HGBA1C in the last 72 hours. CBG: No results for input(s): GLUCAP in the last 168 hours. Lipid Profile: No results for input(s): CHOL, HDL, LDLCALC, TRIG, CHOLHDL, LDLDIRECT in the last 72 hours. Thyroid Function Tests: No results for input(s): TSH, T4TOTAL, FREET4, T3FREE, THYROIDAB in the last 72 hours. Anemia Panel: No results for input(s): VITAMINB12, FOLATE, FERRITIN, TIBC, IRON, RETICCTPCT in the last 72 hours. Sepsis Labs: No results for input(s): PROCALCITON, LATICACIDVEN in the last 168 hours.  Recent Results (from the past 240 hour(s))  C difficile quick scan w PCR reflex     Status: Abnormal   Collection Time: 01/06/16 12:57 PM  Result Value Ref Range Status   C Diff antigen POSITIVE (A) NEGATIVE Final   C Diff toxin POSITIVE (A) NEGATIVE Final   C Diff interpretation Toxin producing C. difficile detected.  Final    Comment: CRITICAL RESULT CALLED TO, READ BACK BY AND VERIFIED WITH: ROBINSON,L. AT [place] ON 01/06/2016 BY BAUGHAM,M.          Radiology Studies: No results found.      Scheduled Meds: . atorvastatin  20 mg Oral QPM  . brimonidine  1 drop Both Eyes BID  . budesonide (PULMICORT) nebulizer solution  1 mg Nebulization BID  . calcium-vitamin D  1 tablet Oral Daily  . citalopram  20 mg Oral  QHS  . feeding supplement  1 Container Oral TID BM  . gabapentin  600 mg Oral TID  . Influenza vac split quadrivalent PF  0.5 mL Intramuscular Tomorrow-1000  . latanoprost  1 drop Both Eyes QHS  . montelukast  10 mg Oral QHS  . pneumococcal 23 valent vaccine  0.5 mL Intramuscular Tomorrow-1000  . predniSONE  15 mg Oral Daily  . rivaroxaban  15 mg Oral Q supper  . saccharomyces boulardii  250 mg Oral BID  . sodium chloride flush  3 mL Intravenous Q12H  . tiotropium  1 capsule Inhalation Daily  . vancomycin  125 mg Oral QID   Followed by  . [START ON 01/20/2016] vancomycin  125 mg Oral BID   Followed by  . [START ON 01/28/2016] vancomycin  125 mg Oral Daily   Followed by  . [START ON 02/04/2016] vancomycin  125 mg  Oral QODAY   Followed by  . [START ON 02/12/2016] vancomycin  125 mg Oral Q3 days   Continuous Infusions: . 0.9 % NaCl with KCl 20 mEq / L 100 mL/hr at 01/06/16 1942     LOS: 1 day    Time spent: 25 minutes     Kathie Dike, MD [facility] If 7PM-7AM, please contact night-coverage [email or web address] Password [email or web address] 01/07/2016, 7:25 AM

## 2016-01-07 NOTE — Care Management Note (Signed)
Case Management Note  Patient Details  Name: Deborah Murray MRN: [id number] Date of Birth: November 29, 1960  Subjective/Objective:                  Pt admitted with collitis/C-Diff. She is from home, lives alone and is ind with ADL's. She is able to drive herself to appointments. She uses cane and walker for mobility. She had no DME needs PTA. She was recently DC'd from [place] and is not living at home with [place] and PT through [facility]. Romualdo Bolk, of [facility], is aware of admission. She plans to return home with resumption of [place] services at DC. She states she feels she was doing well at home alone PTA.   Action/Plan: Will cont to follow for DC planning needs.   Expected Discharge Date:  01/11/16               Expected Discharge Plan:  [place]  In-House Referral:  NA  Discharge planning Services  CM Consult  Post Acute Care Choice:  Home Health, Resumption of Svcs/PTA Provider Choice offered to:  Patient  DME Arranged:    DME Agency:     HH Arranged:  RN, PT [place] Agency:  [place]  Status of Service:  In process, will continue to follow  If discussed at Long Length of Stay Meetings, dates discussed:    Additional Comments:  Sherald Barge, RN 01/07/2016, 2:35 PM

## 2016-01-07 NOTE — Plan of Care (Signed)
Problem: Physical Regulation: Goal: Will remain free from infection Outcome: Progressing Pt positive for C. Diff.

## 2016-01-08 MED ORDER — ADULT MULTIVITAMIN W/MINERALS CH
1.0000 | ORAL_TABLET | Freq: Every day | ORAL | Status: DC
  Administered 2016-01-08 – 2016-01-16 (×9): 1 via ORAL
  Filled 2016-01-08 (×9): qty 1

## 2016-01-08 MED ORDER — PRO-STAT SUGAR FREE PO LIQD
30.0000 mL | Freq: Two times a day (BID) | ORAL | Status: DC
  Administered 2016-01-08 – 2016-01-16 (×12): 30 mL via ORAL
  Filled 2016-01-08 (×15): qty 30

## 2016-01-08 NOTE — Progress Notes (Signed)
PROGRESS NOTE  Deborah Murray [id number] DOB: [birth year] DOA: 01/06/2016 PCP: Sol Passer, MD  Brief Narrative: 8 yof with a past medical history of asthma, COPD, adrenal insufficiency, atrial fibrillation, chronic back pain, and a recent admission for Clostridium difficile presented with complaints of abdominal pain. She was admitted for further evaluation of clostridium difficile colitis.   Assessment/Plan: 1. Recurrent Clostridium difficile colitis. Continues to have significant diarrhea but afebrile and hemodynamics are stable. Abdominal exam benign. No evidence of complicating features at this point. Vancomycin taper continues. 2. Acute kidney injury with associated non-anion gap metabolic acidosis secondary to voluminous diarrhea. Multiple voids noted. 3. Hypovolemic hyponatremia. Secondary diarrhea. Improving. 4. Chronic adrenal sufficiency. Continue home medication.  5. Atrial fibrillation. EKG showed sinus rhythm. Continue Xarelto.  6. Frequent falls. Patients sister is concerned about her balance due to two recent falls at home. Will consult physical therapy for recommendations.  7. Obstructive sleep apnea. On CPAP.    Overall improving. Will continue current treatment including, vancomycin taper and IV fluids.   Recheck labs in the morning.   DVT prophylaxis: Xarelto  Code Status: Full  Family Communication: Sister bedside Disposition Plan: Discharge home once improved.   Murray Hodgkins, MD  [facility] Direct contact: [phone] --Via amion app OR  --[email or web address]; password [email or web address]  7PM-7AM contact night coverage as above 01/08/2016, 5:26 PM  LOS: 2 days   Consultants:  Infections disease   Procedures:  None   Antimicrobials:  Vancomycin 9/26 >>   CC: Follow-up colitis.   Interval history/Subjective: RN noted family concern about patient's balance.  Feels good. Mild generalized abdominal pain. Eating well. Urinating well. A lot of  diarrhea. Abdominal cramping remains the same.   ROS:  No SOB.   Objective: Vitals:   01/08/16 0500 01/08/16 0816 01/08/16 0818 01/08/16 1528  BP: 122/70   112/90  Pulse: (!) 101   91  Resp: 20   20  Temp: 98.3 F (36.8 C)   98.9 F (37.2 C)  TempSrc: Oral   Oral  SpO2: 99% 99% 100% 100%  Weight:      Height:        Intake/Output Summary (Last 24 hours) at 01/08/16 1726 Last data filed at 01/08/16 1218  Gross per 24 hour  Intake          2724.67 ml  Output                0 ml  Net          2724.67 ml     Filed Weights   01/06/16 1153 01/06/16 1928  Weight: 99.8 kg (220 lb) 100.2 kg (220 lb 14.4 oz)    Exam:    Constitutional:  . Appears calm and comfortable Eyes:  Marland Kitchen Lids and sclera unremarkable  ENMT:  . grossly normal hearing  . Lips appear normal; white exudate on the tongue.  Respiratory:  . CTA bilaterally, no w/r/r.  . Respiratory effort normal. No retractions or accessory muscle use Cardiovascular:  . RRR, no m/r/g . No LE extremity edema   Telemetry SR Abdomen:  . soft, non distended, mild generalized tenderness  Musculoskeletal:   Moves all extremities to command  Psychiatric:   Mood appears normal    I have personally reviewed following labs and imaging studies:  No new labs.   Scheduled Meds: . atorvastatin  20 mg Oral QPM  . brimonidine  1 drop Both Eyes BID  . budesonide (PULMICORT) nebulizer solution  1  mg Nebulization BID  . calcium-vitamin D  1 tablet Oral Daily  . citalopram  20 mg Oral QHS  . feeding supplement  1 Container Oral TID BM  . feeding supplement (PRO-STAT SUGAR FREE 64)  30 mL Oral BID  . gabapentin  600 mg Oral TID  . Influenza vac split quadrivalent PF  0.5 mL Intramuscular Tomorrow-1000  . latanoprost  1 drop Both Eyes QHS  . montelukast  10 mg Oral QHS  . multivitamin with minerals  1 tablet Oral Daily  . predniSONE  15 mg Oral Daily  . rivaroxaban  15 mg Oral Q supper  . saccharomyces boulardii  250 mg Oral  BID  . sodium chloride flush  3 mL Intravenous Q12H  . tiotropium  1 capsule Inhalation Daily  . vancomycin  125 mg Oral QID   Followed by  . [START ON 01/20/2016] vancomycin  125 mg Oral BID   Followed by  . [START ON 01/28/2016] vancomycin  125 mg Oral Daily   Followed by  . [START ON 02/04/2016] vancomycin  125 mg Oral QODAY   Followed by  . [START ON 02/12/2016] vancomycin  125 mg Oral Q3 days   Continuous Infusions: . dextrose 5 % 1,000 mL with potassium chloride 20 mEq, sodium bicarbonate 150 mEq infusion 100 mL/hr at 01/08/16 1216    Principal Problem:   C. difficile colitis Active Problems:   OSA on CPAP   History of atrial fibrillation- NSR now   History of adrenal insufficiency   Metabolic acidosis   Hyponatremia   AKI (acute kidney injury) ([place])   LOS: 2 days       By signing my name below, I, Collene Leyden, attest that this documentation has been prepared under the direction and in the presence of Murray Hodgkins, MD. Electronically signed: Collene Leyden, Scribe. 01/08/16 12:22 PM

## 2016-01-08 NOTE — Progress Notes (Addendum)
Spoke with patient's sister who is concerned about the patient's balance. She stated that the patient has been very weak and off balance since she was discharged from her previous hospitalization and she has had 2 falls at home. She would like the pt to have a PT eval.

## 2016-01-08 NOTE — Care Management Important Message (Signed)
Important Message  Patient Details  Name: Deborah Murray MRN: [id number] Date of Birth: 1960/08/18   Medicare Important Message Given:  Yes    Sherald Barge, RN 01/08/2016, 9:44 AM

## 2016-01-08 NOTE — Evaluation (Addendum)
Physical Therapy Evaluation Patient Details Name: Deborah Murray MRN: [id number] DOB: 1961/01/03 Today's Date: 01/08/2016   History of Present Illness  55 yo F admitted with abdominal pain.  Pt had a recent admission for C-diff at the end of July, and she was d/c to [facility] and received additional rehab there.  She reports that she was doing well after her discharge. Her diarrhea and abdominal pain had resolved. She was feeling better. On 9/21 the patient went to eat at [facility] and once she returned home began having diarrhea and abdominal pain. The symptoms persisted having up to 11 bowel movements every day.  Stool was tested for Clostridium difficile returned positive for antigen and toxin.  PMH:  Adrenal abnormality, asthma, Afib, chronic back pain, COPD, HA, HTN, lumbar radiculopathy, Sepsis, syncope and collapse - 05/11/2011, thyroid disease, back surgery, gastric bypass in 2008, R knee surgery.  Clinical Impression  Pt received in bed, sister present.  Pt's sister loudly expressed concern that pt's "equilibrium was off" and therefore, could not participate in physical therapy.  This PT educated sister on how physical therapy addresses mobility and balance deficits, which includes "equilibrium."  Pt was agreeable to PT tx, therefore proceeded with evaluation.  She states that she was not always using her RW at home, but would use it if she felt bad.  She has had 2 falls since she was discharged from [facility] and has been home with her sister there.  Today, during PT evaluation, she is very lethargic, and has difficulty keeping her eyes open during questioning.  She was able to ambulate 93ft with RW and Min A.  On the way back into the room, she used the bathroom, and had a BM, however she seemed to have fallen asleep on the commode, and did not recall what she was doing, why she was doing it, or why the PT was there.  Pt was quickly re-oriented.  Throughout evaluation she required 1-step commands and  increased time to follow those commands.  Recommend an SLP cognitive assessment.  At this point, I do not believe she is safe to d/c home due to multiple recent falls, multiple hospital admissions, poor cognition & lethargy, as well as decreased strength, and balance.  She would benefit from SNF at this point to continue to progress her strength and mobility and return to functional independence.  If that is not an option, then she will need strict 24/7 supervision/assistance, HHPT, BSC, and possibly a w/c at home.      Follow Up Recommendations SNF    Equipment Recommendations  None recommended by PT    Recommendations for Other Services Speech consult (cognitive assessment)     Precautions / Restrictions Precautions Precautions: Fall Precaution Comments: Pt states she has had 2 falls.  Sister states that she fell out of the bed, and then another down the steps.  Restrictions Weight Bearing Restrictions: No      Mobility  Bed Mobility Overal bed mobility: Needs Assistance Bed Mobility: Supine to Sit     Supine to sit: Supervision;HOB elevated (increased time. )        Transfers Overall transfer level: Needs assistance Equipment used: Rolling walker (2 wheeled) Transfers: Sit to/from [facility] Sit to Stand: Min assist (vc's for safety and hand placement) Stand pivot transfers: Min assist (vc's for safety and hand placement)          Ambulation/Gait Ambulation/Gait assistance: Min assist Ambulation Distance (Feet): 20 Feet Assistive device: Rolling  walker (2 wheeled) Gait Pattern/deviations: Step-to pattern;Trunk flexed;Shuffle   Gait velocity interpretation: <1.8 ft/sec, indicative of risk for recurrent falls General Gait Details: Pt was able to ambulate to the door, and on the way back into the room, she requested to use the bathroom.  Pt was assisted onto the bathroom commode.  Door kept open for pt safety, and pt seemed to have fallen asleep on  the commode.  When awakened she was confused, but quickly re-oreiented.    Stairs            Wheelchair Mobility    Modified Rankin (Stroke Patients Only)       Balance Overall balance assessment: Needs assistance;History of Falls Sitting-balance support: Bilateral upper extremity supported Sitting balance-Leahy Scale: Fair     Standing balance support: Bilateral upper extremity supported Standing balance-Leahy Scale: Fair                               Pertinent Vitals/Pain      Home Living   Living Arrangements: Alone   Type of Home: House Home Access: Stairs to enter Entrance Stairs-Rails: Left Entrance Stairs-Number of Steps: 5 on the front. , and 2 going in the back.   Home Layout: One level Home Equipment: Walker - 2 wheels Additional Comments: Pt's sister from [place] has been staying with her since she was discharged from [facility] 2 weeks ago.      Prior Function     Gait / Transfers Assistance Needed: Pt states that she was only occasionally using her RW "when she needed it" since her last admission.             Hand Dominance        Extremity/Trunk Assessment   Upper Extremity Assessment: Generalized weakness           Lower Extremity Assessment: Generalized weakness         Communication      Cognition Arousal/Alertness: Lethargic Behavior During Therapy: WFL for tasks assessed/performed Overall Cognitive Status: Impaired/Different from baseline Area of Impairment: Orientation;Following commands Orientation Level: Time (Pt does not know the date and thinks the month is May.  )   Memory: Decreased short-term memory Following Commands: Follows one step commands with increased time (Requires tc's to follow commands.  )       General Comments: Pt is very lethargic - and keeps eyes closed despite PT's request for pt to keep the open.  While pt was on the commode she seemed to have fallen asleep, and when PT  awakened her, she could not recall who the PT was, what she was doing in the bathroom, or why she was there.  However, she was quickly re-oriented.      General Comments      Exercises     Assessment/Plan    PT Assessment Patient needs continued PT services  PT Problem List Decreased strength;Decreased activity tolerance;Decreased balance;Decreased mobility;Decreased cognition;Decreased knowledge of use of DME;Decreased safety awareness;Decreased knowledge of precautions;Obesity          PT Treatment Interventions DME instruction;Gait training;Functional mobility training;Therapeutic activities;Therapeutic exercise;Balance training;Cognitive remediation;Patient/family education    PT Goals (Current goals can be found in the Care Plan section)  Acute Rehab PT Goals Patient Stated Goal: To get stronger and not loose her balance.  PT Goal Formulation: With patient/family Time For Goal Achievement: 01/22/16 Potential to Achieve Goals: Fair    Frequency Min 4X/week  Barriers to discharge Decreased caregiver support normally lives alone, but has a sister who has been staying with her.     Co-evaluation               End of Session Equipment Utilized During Treatment: Gait belt Activity Tolerance: Patient limited by fatigue Patient left: in chair;with call bell/phone within reach;with family/visitor present Nurse Communication: Mobility status Apolonio Schneiders, RN notified of pt's position, and of BM)    Functional Assessment Tool Used: [employer] "6-clicks"  Functional Limitation: Mobility: Walking and moving around Mobility: Walking and Moving Around Current Status [phone]): At least 40 percent but less than 60 percent impaired, limited or restricted Mobility: Walking and Moving Around Goal Status [phone]): At least 20 percent but less than 40 percent impaired, limited or restricted    Time: 1117-1201 PT Time Calculation (min) (ACUTE ONLY): 44 min   Charges:    PT Evaluation $PT Eval Moderate Complexity: 1 Procedure PT Treatments $Gait Training: 8-22 mins $Therapeutic Activity: 8-22 mins   PT G Codes:   PT G-Codes **NOT FOR INPATIENT CLASS** Functional Assessment Tool Used: [employer] "6-clicks"  Functional Limitation: Mobility: Walking and moving around Mobility: Walking and Moving Around Current Status [phone]): At least 40 percent but less than 60 percent impaired, limited or restricted Mobility: Walking and Moving Around Goal Status [phone]): At least 20 percent but less than 40 percent impaired, limited or restricted    Beth Thecla Forgione, PT, DPT X: [phone]

## 2016-01-08 NOTE — Clinical Social Work Note (Signed)
Clinical Social Work Assessment  Patient Details  Name: Deborah Murray MRN: [id number] Date of Birth: 10-21-1960  Date of referral:  01/08/16               Reason for consult:  Discharge Planning                Permission sought to share information with:    Permission granted to share information::     Name::        Agency::     Relationship::     Contact Information:     Housing/Transportation Living arrangements for the past 2 months:  Single Family Home Source of Information:  Patient Patient Interpreter Needed:  None Criminal Activity/Legal Involvement Pertinent to Current Situation/Hospitalization:  No - Comment as needed Significant Relationships:  Siblings Lives with:  Self Do you feel safe going back to the place where you live?  Yes Need for family participation in patient care:  Yes (Comment)  Care giving concerns:  Pt lives alone.    Social Worker assessment / plan:  CSW met with pt at bedside. Pt reports she lives alone and her best support is her sister. She shared that she was recently d/c from [facility] and now has home health services through [facility]. Pt feels she has been managing well at home and ambulates with a cane. Pt's sister checks on her daily and assists as needed. PT evaluated pt and recommend SNF. CSW discussed placement process and pt has been before. She states that she feels comfortable going home and does not feel that SNF is necessary at d/c. Pt states her sister will help her and she wants to resume home health. CSW signing off.   Employment status:  Disabled (Comment on whether or not currently receiving Disability) Insurance information:  Medicare PT Recommendations:  [place] / Referral to community resources:  [place]  Patient/Family's Response to care:  Pt states that she has been to SNF in the past and does not want to go again.   Patient/Family's Understanding of and Emotional Response to  Diagnosis, Current Treatment, and Prognosis:  Pt is aware of admission diagnosis. She understands PT recommendation for SNF, but is refusing.   Emotional Assessment Appearance:  Appears stated age Attitude/Demeanor/Rapport:  Other (Cooperative) Affect (typically observed):  Appropriate Orientation:  Oriented to Self, Oriented to Place, Oriented to  Time, Oriented to Situation Alcohol / Substance use:  Not Applicable Psych involvement (Current and /or in the community):  No (Comment)  Discharge Needs  Concerns to be addressed:  Discharge Planning Concerns Readmission within the last 30 days:  No Current discharge risk:  Physical Impairment Barriers to Discharge:  Continued Medical Work up   [employer], [employer], [place] 01/08/2016, 1:37 PM [phone]

## 2016-01-08 NOTE — Progress Notes (Signed)
Removed patient from cardiac monitoring.

## 2016-01-08 NOTE — Plan of Care (Signed)
Problem: Bowel/Gastric: Goal: Will not experience complications related to bowel motility Outcome: Progressing Pt still having diarrhea and nausea

## 2016-01-09 LAB — BASIC METABOLIC PANEL
Anion gap: 10 (ref 5–15)
BUN: 39 mg/dL — AB (ref 6–20)
CHLORIDE: 94 mmol/L — AB (ref 101–111)
CO2: 28 mmol/L (ref 22–32)
CREATININE: 1.58 mg/dL — AB (ref 0.44–1.00)
Calcium: 8.1 mg/dL — ABNORMAL LOW (ref 8.9–10.3)
GFR, EST AFRICAN AMERICAN: 41 mL/min — AB (ref >60)
GFR, EST NON AFRICAN AMERICAN: 35 mL/min — AB (ref >60)
Glucose, Bld: 110 mg/dL — ABNORMAL HIGH (ref 65–99)
POTASSIUM: 3.4 mmol/L — AB (ref 3.5–5.1)
SODIUM: 132 mmol/L — AB (ref 135–145)

## 2016-01-09 MED ORDER — SODIUM CHLORIDE 0.9 % IV SOLN
INTRAVENOUS | Status: DC
  Administered 2016-01-09 (×2): via INTRAVENOUS

## 2016-01-09 MED ORDER — POTASSIUM CHLORIDE CRYS ER 20 MEQ PO TBCR
40.0000 meq | EXTENDED_RELEASE_TABLET | Freq: Once | ORAL | Status: AC
  Administered 2016-01-09: 40 meq via ORAL
  Filled 2016-01-09: qty 2

## 2016-01-09 NOTE — Progress Notes (Signed)
PROGRESS NOTE  Deborah Murray [id number] DOB: [birth year] DOA: 01/06/2016 PCP: Sol Passer, MD  Brief Narrative: 31 yof with a past medical history of asthma, COPD, adrenal insufficiency, atrial fibrillation, chronic back pain, and a recent admission for Clostridium difficile presented with complaints of abdominal pain. She was admitted for further evaluation of clostridium difficile colitis.  Assessment/Plan: 1. Recurrent Clostridium difficile colitis.  starting to improve. Only 2 stools today. 2. Acute kidney injury with associated non-anion gap metabolic acidosis secondary to diarrhea. Resolving with hydration 3. Hypovolemic hyponatremia, stable. Secondary diarrhea. 4. Chronic adrenal sufficiency. Continue home medication.  5. Atrial fibrillation. EKG showed sinus rhythm. Continue Xarelto.  6. Frequent falls. Patients sister is concerned about her balance due to two recent falls at home. Physical therapy was consulted and reccommended a skilled nursing facility on discharge, but the patient declined this option. 7. Obstructive sleep apnea. On CPAP.    Signed to improve. Continue oral vancomycin. Stop fluids. Home with physical therapy when diarrhea well-controlled.  DVT prophylaxis: Xarelto  Code Status: Full  Family Communication: Sister bedside Disposition Plan: Home  Murray Hodgkins, MD  [facility] Direct contact: [phone] --Via amion app OR  --[email or web address]; password [email or web address]  7PM-7AM contact night coverage as above 01/09/2016, 7:46 AM  LOS: 3 days   Consultants:  Infectious disease   Procedures:  None   Antimicrobials:  Vancomycin 9/26   CC: Follow-up clostridium difficile colitis   Interval history/Subjective: She had no problems overnight.   Diarrhea is beginning to slow down. Eating okay. Abdominal cramping remains the same. No nausea or vomiting.   ROS: No SOB   Objective: Vitals:   01/08/16 1528 01/08/16 2033 01/08/16 2047 01/09/16  0547  BP: 112/90 120/68  (!) 152/83  Pulse: 91 90  94  Resp: 20 20  20   Temp: 98.9 F (37.2 C) 98.7 F (37.1 C)  98.4 F (36.9 C)  TempSrc: Oral Oral  Oral  SpO2: 100% 100% 95% 98%  Weight:      Height:        Intake/Output Summary (Last 24 hours) at 01/09/16 0746 Last data filed at 01/09/16 0600  Gross per 24 hour  Intake          2703.33 ml  Output                0 ml  Net          2703.33 ml     Filed Weights   01/06/16 1153 01/06/16 1928  Weight: 99.8 kg (220 lb) 100.2 kg (220 lb 14.4 oz)    Exam:    Constitutional:  . Appears calm and comfortable Respiratory:  . CTA bilaterally, no w/r/r.  . Respiratory effort normal. No retractions or accessory muscle use Cardiovascular:  . RRR, no m/r/g . No LE extremity edema    I have personally reviewed following labs and imaging studies:  Sodium stable 132, potassium 3.4, BUN 39, creatinine 1.58.  Scheduled Meds: . atorvastatin  20 mg Oral QPM  . brimonidine  1 drop Both Eyes BID  . budesonide (PULMICORT) nebulizer solution  1 mg Nebulization BID  . calcium-vitamin D  1 tablet Oral Daily  . citalopram  20 mg Oral QHS  . feeding supplement  1 Container Oral TID BM  . feeding supplement (PRO-STAT SUGAR FREE 64)  30 mL Oral BID  . gabapentin  600 mg Oral TID  . Influenza vac split quadrivalent PF  0.5 mL Intramuscular Tomorrow-1000  . latanoprost  1 drop Both Eyes QHS  . montelukast  10 mg Oral QHS  . multivitamin with minerals  1 tablet Oral Daily  . potassium chloride  40 mEq Oral Once  . predniSONE  15 mg Oral Daily  . rivaroxaban  15 mg Oral Q supper  . saccharomyces boulardii  250 mg Oral BID  . sodium chloride flush  3 mL Intravenous Q12H  . tiotropium  1 capsule Inhalation Daily  . vancomycin  125 mg Oral QID   Followed by  . [START ON 01/20/2016] vancomycin  125 mg Oral BID   Followed by  . [START ON 01/28/2016] vancomycin  125 mg Oral Daily   Followed by  . [START ON 02/04/2016] vancomycin  125 mg  Oral QODAY   Followed by  . [START ON 02/12/2016] vancomycin  125 mg Oral Q3 days   Continuous Infusions: . sodium chloride      Principal Problem:   C. difficile colitis Active Problems:   OSA on CPAP   History of atrial fibrillation- NSR now   History of adrenal insufficiency   Metabolic acidosis   Hyponatremia   AKI (acute kidney injury) ([place])   LOS: 3 days   Time spent 20 minutes      By signing my name below, I, Collene Leyden, attest that this documentation has been prepared under the direction and in the presence of Murray Hodgkins, MD. Electronically signed: Collene Leyden, Scribe. 01/09/16 12:25 PM   I personally performed the services described in this documentation. All medical record entries made by the scribe were at my direction. I have reviewed the chart and agree that the record reflects my personal performance and is accurate and complete. Murray Hodgkins, MD

## 2016-01-09 NOTE — Progress Notes (Signed)
IV access lost, MD contacted and PICC order received. [facility] contacted for PICC placement.

## 2016-01-09 NOTE — Evaluation (Signed)
Speech Language Pathology Evaluation Patient Details Name: Deborah Murray MRN: [id number] DOB: 11-04-1960 Today's Date: 01/09/2016 Time: [id number] SLP Time Calculation (min) (ACUTE ONLY): 28 min  Problem List:  Patient Active Problem List   Diagnosis Date Noted  . Anasarca 11/18/2015  . Generalized abdominal pain   . Protein-calorie malnutrition, severe 11/05/2015  . Sepsis ([place]) 11/04/2015  . AKI (acute kidney injury) ([place])   . Pyrexia   . Acute kidney injury ([place]) 11/02/2015  . Hyponatremia 11/02/2015  . Metabolic acidosis [id number]  . Hypocalcemia 11/01/2015  . C. difficile colitis 10/31/2015  . Pancolitis ([place]) 10/30/2015  . C. difficile diarrhea 10/30/2015  . Abdominal pain in female 10/30/2015  . Hypotension 10/30/2015  . History of atrial fibrillation- NSR now 10/30/2015  . History of adrenal insufficiency 10/30/2015  . History of hypertension 10/30/2015  . Esophageal reflux   . Nausea   . GERD (gastroesophageal reflux disease) 08/28/2015  . Nausea without vomiting 08/28/2015  . Abdominal pain, epigastric 08/28/2015  . Dark stools 08/28/2015  . Atrial fibrillation with RVR ([place]) 05/12/2014  . Hypokalemia 05/12/2014  . Essential hypertension 05/12/2014  . Severe persistent asthma 05/12/2014  . OSA on CPAP 05/12/2014  . Toxic nodular goiter 05/12/2014  . Morbid obesity ([place]) 05/12/2014  . Pain in joint, shoulder region 08/02/2013  . Muscle weakness (generalized) 08/02/2013  . Decreased range of motion of left shoulder 08/02/2013  . Tight fascia 08/02/2013   Past Medical History:  Past Medical History:  Diagnosis Date  . Adrenal abnormality ([place])   . Adrenal insufficiency ([place]) 2013   Tx at [facility]  . Asthma   . Atrial fibrillation ([place])   . Chronic back pain   . COPD (chronic obstructive pulmonary disease) ([place])   . Headache   . Hyperlipidemia   . Hypertension   . Lumbar radiculopathy   . Sepsis ([place]) 11/04/2015  . Syncope and collapse 05/11/11  adrenal  insuffiency  treated at [facility]  . Thyroid disease   . Vocal cord dysfunction    Past Surgical History:  Past Surgical History:  Procedure Laterality Date  . ABDOMINAL HYSTERECTOMY    . BACK SURGERY     disc   . CARDIAC CATHETERIZATION  2012 at [facility]  . CHOLECYSTECTOMY    . CHOLECYSTECTOMY, LAPAROSCOPIC    . ESOPHAGOGASTRODUODENOSCOPY N/A 09/17/2015   Procedure: ESOPHAGOGASTRODUODENOSCOPY (EGD);  Surgeon: Daneil Dolin, MD;  Location: [facility];  Service: Endoscopy;  Laterality: [id number]  . eye surg for glaucoma    . gastic by-pass  05-11-11   Gastric by-pass in 2008  . right knee surg     HPI:  55 yo F admitted with abdominal pain.  Pt had a recent admission for C-diff at the end of July, and she was d/c to [facility] and received additional rehab there.  She reports that she was doing well after her discharge. Her diarrhea and abdominal pain had resolved. She was feeling better. On 9/21 the patient went to eat at [facility] and once she returned home began having diarrhea and abdominal pain. The symptoms persisted having up to 11 bowel movements every day.  Stool was tested for Clostridium difficile returned positive for antigen and toxin.  PMH:  Adrenal abnormality, asthma, Afib, chronic back pain, COPD, HA, HTN, lumbar radiculopathy, Sepsis, syncope and collapse - 05/11/2011, thyroid disease, back surgery, gastric bypass in 2008, R knee surgery.   Assessment / Plan / Recommendation Clinical Impression  Pt seen at bedside  for cogntive linguistic evaluation with sister present. Her sister states that her speech "might be a little bit slurrier." She also reports that pt occasionally "falls asleep" while driving and pt states that this is due to her medication. Pt denies any current changes in thinking, memory, speech, language, or swallowing. Pt demonstrates WFL cognitive linguistic skills during assessment this date (off date by one). She was able to follow multi-step commands with 100% acc  and provide historical information related to recent hospitalizations. No further SLP services indicated at this time. Reconsult should the need arise.    SLP Assessment  Patient does not need any further Speech Lanaguage Pathology Services    Follow Up Recommendations  None    Frequency and Duration           SLP Evaluation Cognition  Overall Cognitive Status: Within Functional Limits for tasks assessed Arousal/Alertness: Awake/alert Orientation Level: Oriented X4 Memory: Appears intact Awareness: Appears intact Problem Solving: Appears intact Safety/Judgment: Appears intact       Comprehension  Auditory Comprehension Overall Auditory Comprehension: Appears within functional limits for tasks assessed Yes/No Questions: Within Functional Limits Commands: Within Functional Limits Conversation: Complex Visual Recognition/Discrimination Discrimination: Within Function Limits Reading Comprehension Reading Status: Not tested    Expression Expression Primary Mode of Expression: Verbal Verbal Expression Overall Verbal Expression: Appears within functional limits for tasks assessed Initiation: No impairment Automatic Speech: Name;Day of week;Social Response Level of Generative/Spontaneous Verbalization: Conversation Repetition: No impairment Naming: No impairment Pragmatics: No impairment Non-Verbal Means of Communication: Not applicable Written Expression Dominant Hand: Right Written Expression: Not tested   Oral / Motor  Oral Motor/Sensory Function Overall Oral Motor/Sensory Function: Within functional limits Motor Speech Overall Motor Speech: Appears within functional limits for tasks assessed Respiration: Within functional limits Phonation: Normal Resonance: Within functional limits Articulation: Within functional limitis Intelligibility: Intelligible Motor Planning: Witnin functional limits Motor Speech Errors: Not applicable               Thank  you,  Genene Churn, [place] 01/09/2016, 9:02 PM

## 2016-01-10 LAB — BASIC METABOLIC PANEL
Anion gap: 8 (ref 5–15)
BUN: 33 mg/dL — ABNORMAL HIGH (ref 6–20)
CHLORIDE: 99 mmol/L — AB (ref 101–111)
CO2: 25 mmol/L (ref 22–32)
CREATININE: 1.18 mg/dL — AB (ref 0.44–1.00)
Calcium: 8.3 mg/dL — ABNORMAL LOW (ref 8.9–10.3)
GFR calc non Af Amer: 50 mL/min — ABNORMAL LOW (ref >60)
GFR, EST AFRICAN AMERICAN: 58 mL/min — AB (ref >60)
Glucose, Bld: 85 mg/dL (ref 65–99)
Potassium: 3.4 mmol/L — ABNORMAL LOW (ref 3.5–5.1)
Sodium: 132 mmol/L — ABNORMAL LOW (ref 135–145)

## 2016-01-10 MED ORDER — RIVAROXABAN 20 MG PO TABS
20.0000 mg | ORAL_TABLET | Freq: Every day | ORAL | Status: DC
  Administered 2016-01-10 – 2016-01-15 (×5): 20 mg via ORAL
  Filled 2016-01-10 (×6): qty 1

## 2016-01-10 MED ORDER — POTASSIUM CHLORIDE CRYS ER 20 MEQ PO TBCR
40.0000 meq | EXTENDED_RELEASE_TABLET | Freq: Once | ORAL | Status: AC
  Administered 2016-01-10: 40 meq via ORAL
  Filled 2016-01-10: qty 2

## 2016-01-10 NOTE — Care Management Important Message (Signed)
Important Message  Patient Details  Name: Deborah Murray MRN: [id number] Date of Birth: 1960-07-13   Medicare Important Message Given:  Yes    Sherald Barge, RN 01/10/2016, 12:27 PM

## 2016-01-10 NOTE — Progress Notes (Signed)
PROGRESS NOTE  Deborah Murray [id number] DOB: 02-23-1961 DOA: 01/06/2016 PCP: Sol Passer, MD  Brief Narrative: 79 yof with a past medical history of asthma, COPD, adrenal insufficiency, atrial fibrillation, chronic back pain, and a recent admission for Clostridium difficile presented with complaints of abdominal pain. She was admitted for further evaluation of clostridium difficile colitis.  Assessment/Plan: 1. Recurrent Clostridium difficilecolitis.Starting to improve with oral vancomycin. 2. Acute kidney injury with associated non-anion gap metabolic acidosis secondary to diarrhea. Resolved. 3. Hypovolemic hyponatremia, stable. Secondary to diarrhea. 4. Chronic adrenal sufficiency. Stable. Continue home medication.  5. Atrial fibrillation. EKG showed sinus rhythm. ContinueXarelto.  6. Frequent falls reported at home. Physical therapy recommended skilled nursing facility. Patient refused.  7. Obstructive sleep apnea. On CPAP.    Overall improving. Continue oral vancomycin. Discontinue IV fluids. Home with physical therapy when diarrhea is well-controlled.   Anticipate discharge in 72 hours.   DVT prophylaxis: Xarelto  Code Status: Full  Family Communication: No family bedside Disposition Plan: Home in 65 hours   Murray Hodgkins, MD  [facility] Direct contact: [phone] --[address] [place]  --[email or web address]; password [email or web address]  7PM-7AM contact night coverage as above 01/10/2016, 3:07 PM  LOS: 4 days   Consultants:  Infectious disease   Procedures:  None   Antimicrobials:  Vancomycin 9/26 >>   CC: Follow-up C.Diff colitis   Interval history/Subjective: Doing okay. Four bowel movements today. Reports 4 yesterday. Some abdominal cramping. Tolerating diet but poor appetite.  ROS: No vomiting.   Objective: Vitals:   01/09/16 2216 01/10/16 0807 01/10/16 0810 01/10/16 1434  BP: 122/80   115/80  Pulse: 84   85  Resp: 18   18  Temp: 98.5 F (36.9 C)    99 F (37.2 C)  TempSrc: Oral   Oral  SpO2: 100% 90% 90% 99%  Weight:      Height:        Intake/Output Summary (Last 24 hours) at 01/10/16 1507 Last data filed at 01/10/16 0900  Gross per 24 hour  Intake           650.42 ml  Output              400 ml  Net           250.42 ml     Filed Weights   01/06/16 1153 01/06/16 1928  Weight: 99.8 kg (220 lb) 100.2 kg (220 lb 14.4 oz)    Exam:    Constitutional:  . Appears calm and comfortableSitting on the side of the bed. Respiratory:  . CTA bilaterally, no w/r/r.  . Respiratory effort normal. No retractions or accessory muscle use Cardiovascular:  . RRR, no m/r/g . No LE extremity edema   Abdomen:  . soft and non distended. She has lower abdominal tenderness to palpation, no upper tenderness.  I have personally reviewed following labs and imaging studies:  Sodium 132, potassium 3.4, Creatinine 1.18   Scheduled Meds: . atorvastatin  20 mg Oral QPM  . brimonidine  1 drop Both Eyes BID  . budesonide (PULMICORT) nebulizer solution  1 mg Nebulization BID  . calcium-vitamin D  1 tablet Oral Daily  . citalopram  20 mg Oral QHS  . feeding supplement  1 Container Oral TID BM  . feeding supplement (PRO-STAT SUGAR FREE 64)  30 mL Oral BID  . gabapentin  600 mg Oral TID  . Influenza vac split quadrivalent PF  0.5 mL Intramuscular Tomorrow-1000  . latanoprost  1  drop Both Eyes QHS  . montelukast  10 mg Oral QHS  . multivitamin with minerals  1 tablet Oral Daily  . predniSONE  15 mg Oral Daily  . rivaroxaban  20 mg Oral Q supper  . saccharomyces boulardii  250 mg Oral BID  . sodium chloride flush  3 mL Intravenous Q12H  . tiotropium  1 capsule Inhalation Daily  . vancomycin  125 mg Oral QID   Followed by  . [START ON 01/20/2016] vancomycin  125 mg Oral BID   Followed by  . [START ON 01/28/2016] vancomycin  125 mg Oral Daily   Followed by  . [START ON 02/04/2016] vancomycin  125 mg Oral QODAY   Followed by  . [START ON  02/12/2016] vancomycin  125 mg Oral Q3 days   Continuous Infusions:   Principal Problem:   C. difficile colitis Active Problems:   OSA on CPAP   History of atrial fibrillation- NSR now   History of adrenal insufficiency   Metabolic acidosis   Hyponatremia   AKI (acute kidney injury) ([place])   LOS: 4 days   Time spent 15 minutes      By signing my name below, I, Collene Leyden, attest that this documentation has been prepared under the direction and in the presence of Murray Hodgkins, MD. Electronically signed: Collene Leyden, Scribe. 01/10/16    I personally performed the services described in this documentation. All medical record entries made by the scribe were at my direction. I have reviewed the chart and agree that the record reflects my personal performance and is accurate and complete. Murray Hodgkins, MD

## 2016-01-10 NOTE — Care Management Note (Signed)
Case Management Note  Patient Details  Name: MAKAELYN EIB MRN: [id number] Date of Birth: 12/01/1960  Expected Discharge Date:  01/11/16               Expected Discharge Plan:  [place]  In-House Referral:  NA  Discharge planning Services  CM Consult  Post Acute Care Choice:  Home Health, Resumption of Svcs/PTA Provider Choice offered to:  Patient  DME Arranged:    DME Agency:     HH Arranged:  RN, PT [place] Agency:  [facility]  Status of Service:  Completed, signed off  If discussed at [place] of Stay Meetings, dates discussed:    Additional Comments: Anticipate DC home over weekend if BM's improve. Pt still refuses SNF and plans to return home with resumption of her [facility] services through [facility]. She understands HH has 48 hrs to resume services. Weekend, RN will notify [facility] if pt discharges.   Sherald Barge, RN 01/10/2016, 12:38 PM

## 2016-01-11 ENCOUNTER — Encounter (HOSPITAL_COMMUNITY): Payer: Self-pay | Admitting: *Deleted

## 2016-01-11 MED ORDER — FUROSEMIDE 40 MG PO TABS
40.0000 mg | ORAL_TABLET | Freq: Every day | ORAL | Status: DC
  Administered 2016-01-11 – 2016-01-12 (×2): 40 mg via ORAL
  Filled 2016-01-11 (×2): qty 1

## 2016-01-11 MED ORDER — SPIRONOLACTONE 25 MG PO TABS
50.0000 mg | ORAL_TABLET | Freq: Every day | ORAL | Status: DC
  Administered 2016-01-11 – 2016-01-16 (×6): 50 mg via ORAL
  Filled 2016-01-11 (×6): qty 2

## 2016-01-11 NOTE — Progress Notes (Signed)
PROGRESS NOTE  Deborah Murray [id number] DOB: [birth year] DOA: 01/06/2016 PCP: Sol Passer, MD  Brief Narrative: 72 yof with a past medical history of asthma, COPD, adrenal insufficiency, atrial fibrillation, chronic back pain, and a recent admission for Clostridium difficile presented with complaints of abdominal pain. She was admitted for further evaluation of clostridium difficile colitis.  Assessment/Plan: 1. Recurrent Clostridium difficilecolitis. Continues to slowly improve. Less cramping. Last diarrhea. 2. Acute kidney injury with associated non-anion gap metabolic acidosis, resolved. 3. Hypovolemic hyponatremia, resolved. 4. Chronic adrenal insufficiency remains stable. 5. Atrial fibrillation. Continue Xarelto. 6. Frequent falls reported at home. Physical therapy recommended skilled nursing facility. Patient refused.    Overall doing well. continue oral vancomycin. Restart diuretics.  Anticipate discharge in 48 hours.   DVT prophylaxis: Xarelto  Code Status: Full  Family Communication: No family at bedside Disposition Plan: Home in 37 hours   Murray Hodgkins, MD  [facility] Direct contact: [phone] --[address] [place]  --[email or web address]; password [email or web address]  7PM-7AM contact night coverage as above 01/11/2016, 7:01 AM  LOS: 5 days   Consultants:  Infectious disease   Procedures:  None   Antimicrobials:  Vancomycin 9/26 >>   CC: Follow-up C.Diff colitis   Interval history/Subjective: She is doing pretty well today. She had diarrhea twice today. Cramping is better. Nausea is better. She is eating well  ROS:  Swelling in legs. Breathing is okay.  Objective: Vitals:   01/10/16 1434 01/10/16 1943 01/10/16 2243 01/11/16 0529  BP: 115/80  123/84 (!) 141/96  Pulse: 85  84 81  Resp: 18  18 19   Temp: 99 F (37.2 C)  98.3 F (36.8 C) 98 F (36.7 C)  TempSrc: Oral  Oral Oral  SpO2: 99% 99% 100% 100%  Weight:      Height:        Intake/Output  Summary (Last 24 hours) at 01/11/16 0701 Last data filed at 01/10/16 1542  Gross per 24 hour  Intake              480 ml  Output              200 ml  Net              280 ml     Filed Weights   01/06/16 1153 01/06/16 1928  Weight: 99.8 kg (220 lb) 100.2 kg (220 lb 14.4 oz)    Exam:  Constitutional:  . Appears calm and comfortable Respiratory:  . CTA bilaterally, no w/r/r.  . Respiratory effort normal. No retractions or accessory muscle use Cardiovascular:  . RRR, no m/r/g . 2+ billateral LE extremity edema up to the knees    I have personally reviewed following labs and imaging studies:  No new labs  Scheduled Meds: . atorvastatin  20 mg Oral QPM  . brimonidine  1 drop Both Eyes BID  . budesonide (PULMICORT) nebulizer solution  1 mg Nebulization BID  . calcium-vitamin D  1 tablet Oral Daily  . citalopram  20 mg Oral QHS  . feeding supplement  1 Container Oral TID BM  . feeding supplement (PRO-STAT SUGAR FREE 64)  30 mL Oral BID  . gabapentin  600 mg Oral TID  . Influenza vac split quadrivalent PF  0.5 mL Intramuscular Tomorrow-1000  . latanoprost  1 drop Both Eyes QHS  . montelukast  10 mg Oral QHS  . multivitamin with minerals  1 tablet Oral Daily  . predniSONE  15 mg Oral Daily  .  rivaroxaban  20 mg Oral Q supper  . saccharomyces boulardii  250 mg Oral BID  . sodium chloride flush  3 mL Intravenous Q12H  . tiotropium  1 capsule Inhalation Daily  . vancomycin  125 mg Oral QID   Followed by  . [START ON 01/20/2016] vancomycin  125 mg Oral BID   Followed by  . [START ON 01/28/2016] vancomycin  125 mg Oral Daily   Followed by  . [START ON 02/04/2016] vancomycin  125 mg Oral QODAY   Followed by  . [START ON 02/12/2016] vancomycin  125 mg Oral Q3 days   Continuous Infusions:   Principal Problem:   C. difficile colitis Active Problems:   OSA on CPAP   History of atrial fibrillation- NSR now   History of adrenal insufficiency   Metabolic acidosis    Hyponatremia   AKI (acute kidney injury) ([place])   LOS: 5 days   Time spent 25 minutes  By signing my name below, I, Hilbert Odor, attest that this documentation has been prepared under the direction and in the presence of Daniel P. Sarajane Jews, MD. Electronically signed: Hilbert Odor, Scribe.  01/11/16, 10:00 AM  I personally performed the services described in this documentation. All medical record entries made by the scribe were at my direction. I have reviewed the chart and agree that the record reflects my personal performance and is accurate and complete. Murray Hodgkins, MD

## 2016-01-11 NOTE — Progress Notes (Signed)
Patient does not wear CPAP at home and does not wish one.

## 2016-01-12 MED ORDER — FUROSEMIDE 10 MG/ML IJ SOLN
40.0000 mg | Freq: Four times a day (QID) | INTRAMUSCULAR | Status: AC
  Administered 2016-01-12 (×2): 40 mg via INTRAVENOUS
  Filled 2016-01-12 (×2): qty 4

## 2016-01-12 MED ORDER — OXYCODONE HCL 5 MG PO TABS
10.0000 mg | ORAL_TABLET | Freq: Four times a day (QID) | ORAL | Status: DC | PRN
  Administered 2016-01-12 – 2016-01-16 (×13): 10 mg via ORAL
  Filled 2016-01-12 (×13): qty 2

## 2016-01-12 MED ORDER — ENSURE ENLIVE PO LIQD
237.0000 mL | Freq: Two times a day (BID) | ORAL | Status: DC
  Administered 2016-01-12 – 2016-01-16 (×7): 237 mL via ORAL

## 2016-01-12 NOTE — Progress Notes (Signed)
PROGRESS NOTE  CASIA PLA [id number] DOB: 04-29-1960 DOA: 01/06/2016 PCP: Sol Passer, MD  Brief Narrative: 57 yof with a past medical history of asthma, COPD, adrenal insufficiency, atrial fibrillation, chronic back pain, and a recent admission for Clostridium difficile presented with complaints of abdominal pain. She was admitted for further evaluation of clostridium difficile colitis.  Assessment/Plan: 1. Recurrent Clostridium difficilecolitis. Improving. Less cramping and nausea. Still having some stools today. 2. Acute kidney injury with non-anion gap metabolic acidosis, resolved. 3. Hypovolemic hyponatremia, expect spontaneous resolution 4. Chronic adrenal insufficiency, stable 5. Atrial fibrillation. Continue anticoagulation. 6. Frequent falls reported at home. Physical therapy recommended skilled nursing facility. Patient refused.    Overall improving. Continue oral vancomycin. Increase diuretics.  Anticipate discharge in 48 hours as stool frequency decreases.  DVT prophylaxis: Xarelto  Code Status: Full  Family Communication: No family at bedside Disposition Plan: Home in 69 hours  Murray Hodgkins, MD  [facility] Direct contact: [phone] --[address] [place]  --[email or web address]; password [email or web address]  7PM-7AM contact night coverage as above 01/12/2016, 4:48 PM  LOS: 6 days   Consultants:  Infectious disease   Procedures:  None   Antimicrobials:  Vancomycin 9/26 >>   CC: Follow-up C. diff colitis   Interval history/Subjective: Feeling better today. Less cramping. Less nausea. Tolerating food. Still having significant diarrhea. 7 stools yesterday. 7 already today.  ROS:  Increased bilateral lower extremity edema  Objective: Vitals:   01/11/16 1521 01/11/16 2007 01/11/16 2030 01/12/16 0530  BP: 125/82  130/88 125/83  Pulse: 85  80 75  Resp: 20  20 20   Temp: 97.7 F (36.5 C)  98 F (36.7 C) 98.1 F (36.7 C)  TempSrc: Oral  Oral Oral    SpO2: 100% 98% 100% 96%  Weight:      Height:        Intake/Output Summary (Last 24 hours) at 01/12/16 1648 Last data filed at 01/12/16 0825  Gross per 24 hour  Intake              720 ml  Output                0 ml  Net              720 ml     Filed Weights   01/06/16 1153 01/06/16 1928  Weight: 99.8 kg (220 lb) 100.2 kg (220 lb 14.4 oz)    Exam:  Constitutional:  . Appears calm and comfortable Respiratory:  . CTA bilaterally, no w/r/r.  . Respiratory effort normal. No retractions or accessory muscle use Cardiovascular:  . RRR, no m/r/g . 2+ bilateral LE extremity edema   Abdomen soft.  I have personally reviewed following labs and imaging studies:  No new labs  Scheduled Meds: . atorvastatin  20 mg Oral QPM  . brimonidine  1 drop Both Eyes BID  . budesonide (PULMICORT) nebulizer solution  1 mg Nebulization BID  . calcium-vitamin D  1 tablet Oral Daily  . citalopram  20 mg Oral QHS  . feeding supplement (ENSURE ENLIVE)  237 mL Oral BID BM  . feeding supplement (PRO-STAT SUGAR FREE 64)  30 mL Oral BID  . furosemide  40 mg Intravenous Q6H  . gabapentin  600 mg Oral TID  . Influenza vac split quadrivalent PF  0.5 mL Intramuscular Tomorrow-1000  . latanoprost  1 drop Both Eyes QHS  . montelukast  10 mg Oral QHS  . multivitamin with minerals  1 tablet  Oral Daily  . predniSONE  15 mg Oral Daily  . rivaroxaban  20 mg Oral Q supper  . saccharomyces boulardii  250 mg Oral BID  . sodium chloride flush  3 mL Intravenous Q12H  . spironolactone  50 mg Oral Daily  . tiotropium  1 capsule Inhalation Daily  . vancomycin  125 mg Oral QID   Followed by  . [START ON 01/20/2016] vancomycin  125 mg Oral BID   Followed by  . [START ON 01/28/2016] vancomycin  125 mg Oral Daily   Followed by  . [START ON 02/04/2016] vancomycin  125 mg Oral QODAY   Followed by  . [START ON 02/12/2016] vancomycin  125 mg Oral Q3 days   Continuous Infusions:   Principal Problem:   C.  difficile colitis Active Problems:   OSA on CPAP   History of atrial fibrillation- NSR now   History of adrenal insufficiency   Metabolic acidosis   Hyponatremia   AKI (acute kidney injury) ([place])   LOS: 6 days   Time spent 15 minutes  By signing my name below, I, Hilbert Odor, attest that this documentation has been prepared under the direction and in the presence of Simmie Garin P. Sarajane Jews, MD. Electronically signed: Hilbert Odor, Scribe.  01/12/16, 1:43 PM  I personally performed the services described in this documentation. All medical record entries made by the scribe were at my direction. I have reviewed the chart and agree that the record reflects my personal performance and is accurate and complete. Murray Hodgkins, MD

## 2016-01-13 MED ORDER — FUROSEMIDE 40 MG PO TABS
40.0000 mg | ORAL_TABLET | Freq: Every day | ORAL | Status: DC
  Administered 2016-01-14 – 2016-01-16 (×3): 40 mg via ORAL
  Filled 2016-01-13 (×3): qty 1

## 2016-01-13 NOTE — Plan of Care (Signed)
Problem: Physical Regulation: Goal: Ability to maintain clinical measurements within normal limits will improve Outcome: Progressing See vital sign DOC flowsheet  Problem: Nutrition: Goal: Adequate nutrition will be maintained Outcome: Progressing Pt refuses protein supplements but has adequate PO intake. Will continue to monitor pt nutrient status.  Problem: Bowel/Gastric: Goal: Will not experience complications related to bowel motility Outcome: Progressing Pt continues to have increased stools related to disease process. Pt reported nine bowel movements today, but states bowel motility is improving.

## 2016-01-13 NOTE — Progress Notes (Signed)
Physical Therapy Treatment Patient Details Name: Deborah Murray MRN: [id number] DOB: 02-17-1961 Today's Date: 01/13/2016    History of Present Illness 55 yo F admitted with abdominal pain.  Pt had a recent admission for C-diff at the end of July, and she was d/c to [facility] and received additional rehab there.  She reports that she was doing well after her discharge. Her diarrhea and abdominal pain had resolved. She was feeling better. On 9/21 the patient went to eat at [facility] and once she returned home began having diarrhea and abdominal pain. The symptoms persisted having up to 11 bowel movements every day.  Stool was tested for Clostridium difficile returned positive for antigen and toxin.  PMH:  Adrenal abnormality, asthma, Afib, chronic back pain, COPD, HA, HTN, lumbar radiculopathy, Sepsis, syncope and collapse - 05/11/2011, thyroid disease, back surgery, gastric bypass in 2008, R knee surgery.    PT Comments    Pt received in bed, sister present, and pt is agreeable to PT tx.  Pt is much more alert today, with improved cognition.  Pt increased ambulation to 26ft with RW and Min guard.  Recommend that she d/c home with HHPT.    Follow Up Recommendations  Home health PT     Equipment Recommendations  None recommended by PT    Recommendations for Other Services       Precautions / Restrictions Precautions Precautions: Fall Precaution Comments: Pt states she has had 2 falls.  Sister states that she fell out of the bed, and then another down the steps - states she tripped on a throw rug.  Restrictions Weight Bearing Restrictions: No    Mobility  Bed Mobility Overal bed mobility: Modified Independent       Supine to sit: HOB elevated;Modified independent (Device/Increase time)        Transfers Overall transfer level: Modified independent Equipment used: Rolling walker (2 wheeled) Transfers: Sit to/from Stand Sit to Stand: Modified independent (Device/Increase  time) Stand pivot transfers: Supervision       General transfer comment: Pt was able to transfer on/off bathroom commode Mod (I), and performed peri & hand hygiene independently   Ambulation/Gait Ambulation/Gait assistance: Min guard Ambulation Distance (Feet): 80 Feet Assistive device: Rolling walker (2 wheeled) Gait Pattern/deviations: Step-through pattern;Trunk flexed     General Gait Details: Pt initially with flexed posture, but able to correct with cues.     Stairs            Wheelchair Mobility    Modified Rankin (Stroke Patients Only)       Balance Overall balance assessment: History of Falls;Needs assistance Sitting-balance support: Bilateral upper extremity supported Sitting balance-Leahy Scale: Good     Standing balance support: Bilateral upper extremity supported Standing balance-Leahy Scale: Fair                      Cognition Arousal/Alertness: Awake/alert Behavior During Therapy: WFL for tasks assessed/performed Overall Cognitive Status: Within Functional Limits for tasks assessed                      Exercises      General Comments        Pertinent Vitals/Pain Pain Assessment: No/denies pain    Home Living                      Prior Function            PT Goals (current goals  can now be found in the care plan section) Acute Rehab PT Goals Patient Stated Goal: To get stronger and not loose her balance.  PT Goal Formulation: With patient/family Potential to Achieve Goals: Fair Progress towards PT goals: Progressing toward goals    Frequency    Min 3X/week      PT Plan Current plan remains appropriate    Co-evaluation             End of Session Equipment Utilized During Treatment: Gait belt Activity Tolerance: Patient tolerated treatment well Patient left: in chair;with call bell/phone within reach;with family/visitor present     Time: [id number] PT Time Calculation (min) (ACUTE ONLY):  25 min  Charges:  $Gait Training: 8-22 mins $Therapeutic Activity: 8-22 mins                    G Codes:      Beth Kiandria Clum, PT, DPT X: [phone]

## 2016-01-13 NOTE — Care Management Important Message (Signed)
Important Message  Patient Details  Name: Deborah Murray MRN: [id number] Date of Birth: 01/20/1961   Medicare Important Message Given:  Yes    Sherald Barge, RN 01/13/2016, 11:16 AM

## 2016-01-13 NOTE — Progress Notes (Signed)
PROGRESS NOTE  GERARDINE BAND [id number] DOB: 05-18-1960 DOA: 01/06/2016 PCP: Sol Passer, MD  Brief Narrative: 17 yof with a past medical history of asthma, COPD, adrenal insufficiency, atrial fibrillation, chronic back pain, and a recent admission for Clostridium difficile presented with complaints of abdominal pain. She was admitted for further evaluation of clostridium difficile colitis. Condition is gradually improved with resolution of acute kidney injury. However she continues to have significant diarrhea. Anticipate discharge home once frequency has decreased.  Assessment/Plan: 1. Recurrent Clostridium difficile colitis. Overall improving with minimal cramping. Tolerating diet. However she continues to have frequent bowel movements, 11 today. 2. Acute kidney injury with non-anion gap metabolic acidosis, resolved. 3. Hypovolemic hyponatremia, expect spontaneous resolution 4. Chronic adrenal insufficiency, stable 5. Atrial fibrillation. Continue anticoagulation. 6. Frequent falls reported at home. Physical therapy recommended skilled nursing facility. Patient refused.    Continue oral vancomycin.   Home when stool frequency decreased.  DVT prophylaxis: Xarelto  Code Status: Full  Family Communication: sister at bedside Disposition Plan: Home   Murray Hodgkins, MD  [facility] Direct contact: [phone] --Via amion app OR  --[email or web address]; password [email or web address]  7PM-7AM contact night coverage as above 01/13/2016, 7:42 AM  LOS: 7 days   Consultants:  Infectious disease   Procedures:  None   Antimicrobials:  Vancomycin 9/26 >>  CC: Follow-up C.Diff colitis   Interval history/Subjective: Overall feeling better. Minimal abdominal cramping. Tolerating diet. Continue to have diarrhea with 11 bowel movements today.  ROS: Decreased lower extremity edema  Objective: Vitals:   01/12/16 2155 01/13/16 0500 01/13/16 0719 01/13/16 0722  BP: 115/81 123/81      Pulse: 79 74    Resp: 18 18    Temp: 98.1 F (36.7 C) 98.5 F (36.9 C)    TempSrc: Oral Oral    SpO2: 100% 99% 98% 99%  Weight:      Height:        Intake/Output Summary (Last 24 hours) at 01/13/16 0742 Last data filed at 01/13/16 0100  Gross per 24 hour  Intake              960 ml  Output                0 ml  Net              960 ml     Filed Weights   01/06/16 1153 01/06/16 1928  Weight: 99.8 kg (220 lb) 100.2 kg (220 lb 14.4 oz)    Exam:    Constitutional:  . Appears calm and comfortable Respiratory:  . CTA bilaterally, no w/r/r.  . Respiratory effort normal. No retractions or accessory muscle use Cardiovascular:  . RRR, no m/r/g . 1+ LE extremity edema   Psychiatric:  . judgement and insight appear normal . Mental status o Mood, affect appropriate  I have personally reviewed following labs and imaging studies:  No new labs   Scheduled Meds: . atorvastatin  20 mg Oral QPM  . brimonidine  1 drop Both Eyes BID  . budesonide (PULMICORT) nebulizer solution  1 mg Nebulization BID  . calcium-vitamin D  1 tablet Oral Daily  . citalopram  20 mg Oral QHS  . feeding supplement (ENSURE ENLIVE)  237 mL Oral BID BM  . feeding supplement (PRO-STAT SUGAR FREE 64)  30 mL Oral BID  . gabapentin  600 mg Oral TID  . Influenza vac split quadrivalent PF  0.5 mL Intramuscular Tomorrow-1000  . latanoprost  1  drop Both Eyes QHS  . montelukast  10 mg Oral QHS  . multivitamin with minerals  1 tablet Oral Daily  . predniSONE  15 mg Oral Daily  . rivaroxaban  20 mg Oral Q supper  . saccharomyces boulardii  250 mg Oral BID  . sodium chloride flush  3 mL Intravenous Q12H  . spironolactone  50 mg Oral Daily  . tiotropium  1 capsule Inhalation Daily  . vancomycin  125 mg Oral QID   Followed by  . [START ON 01/20/2016] vancomycin  125 mg Oral BID   Followed by  . [START ON 01/28/2016] vancomycin  125 mg Oral Daily   Followed by  . [START ON 02/04/2016] vancomycin  125 mg Oral  QODAY   Followed by  . [START ON 02/12/2016] vancomycin  125 mg Oral Q3 days   Continuous Infusions:   Principal Problem:   C. difficile colitis Active Problems:   OSA on CPAP   History of atrial fibrillation- NSR now   History of adrenal insufficiency   Metabolic acidosis   Hyponatremia   AKI (acute kidney injury) ([place])   LOS: 7 days   Time spent 15 minutes      By signing my name below, I, Collene Leyden, attest that this documentation has been prepared under the direction and in the presence of Murray Hodgkins, MD. Electronically signed: Collene Leyden, Scribe. 01/13/16   I personally performed the services described in this documentation. All medical record entries made by the scribe were at my direction. I have reviewed the chart and agree that the record reflects my personal performance and is accurate and complete. Murray Hodgkins, MD

## 2016-01-14 ENCOUNTER — Inpatient Hospital Stay (HOSPITAL_COMMUNITY): Payer: Medicare Other

## 2016-01-14 DIAGNOSIS — A0472 Enterocolitis due to Clostridium difficile, not specified as recurrent: Secondary | ICD-10-CM

## 2016-01-14 LAB — BASIC METABOLIC PANEL
Anion gap: 6 (ref 5–15)
BUN: 25 mg/dL — AB (ref 6–20)
CO2: 23 mmol/L (ref 22–32)
CREATININE: 1.01 mg/dL — AB (ref 0.44–1.00)
Calcium: 8.4 mg/dL — ABNORMAL LOW (ref 8.9–10.3)
Chloride: 108 mmol/L (ref 101–111)
Glucose, Bld: 81 mg/dL (ref 65–99)
Potassium: 3.2 mmol/L — ABNORMAL LOW (ref 3.5–5.1)
SODIUM: 137 mmol/L (ref 135–145)

## 2016-01-14 LAB — COMPREHENSIVE METABOLIC PANEL
ALT: 19 U/L (ref 14–54)
AST: 25 U/L (ref 15–41)
Albumin: 2.3 g/dL — ABNORMAL LOW (ref 3.5–5.0)
Alkaline Phosphatase: 66 U/L (ref 38–126)
Anion gap: 6 (ref 5–15)
BUN: 23 mg/dL — ABNORMAL HIGH (ref 6–20)
CHLORIDE: 106 mmol/L (ref 101–111)
CO2: 22 mmol/L (ref 22–32)
CREATININE: 1.01 mg/dL — AB (ref 0.44–1.00)
Calcium: 8.3 mg/dL — ABNORMAL LOW (ref 8.9–10.3)
Glucose, Bld: 112 mg/dL — ABNORMAL HIGH (ref 65–99)
POTASSIUM: 3.9 mmol/L (ref 3.5–5.1)
SODIUM: 134 mmol/L — AB (ref 135–145)
Total Bilirubin: 0.1 mg/dL — ABNORMAL LOW (ref 0.3–1.2)
Total Protein: 4.9 g/dL — ABNORMAL LOW (ref 6.5–8.1)

## 2016-01-14 LAB — CBC WITH DIFFERENTIAL/PLATELET
BASOS ABS: 0.1 10*3/uL (ref 0.0–0.1)
Basophils Relative: 1 %
EOS ABS: 0 10*3/uL (ref 0.0–0.7)
Eosinophils Relative: 0 %
HCT: 35.1 % — ABNORMAL LOW (ref 36.0–46.0)
Hemoglobin: 11.2 g/dL — ABNORMAL LOW (ref 12.0–15.0)
LYMPHS ABS: 0.7 10*3/uL (ref 0.7–4.0)
Lymphocytes Relative: 5 %
MCH: 28.8 pg (ref 26.0–34.0)
MCHC: 31.9 g/dL (ref 30.0–36.0)
MCV: 90.2 fL (ref 78.0–100.0)
MONO ABS: 0.4 10*3/uL (ref 0.1–1.0)
Monocytes Relative: 3 %
NEUTROS ABS: 13.4 10*3/uL — AB (ref 1.7–7.7)
Neutrophils Relative %: 91 %
PLATELETS: 454 10*3/uL — AB (ref 150–400)
RBC: 3.89 MIL/uL (ref 3.87–5.11)
RDW: 14.8 % (ref 11.5–15.5)
WBC: 14.6 10*3/uL — AB (ref 4.0–10.5)

## 2016-01-14 LAB — MAGNESIUM: MAGNESIUM: 2 mg/dL (ref 1.7–2.4)

## 2016-01-14 MED ORDER — IOPAMIDOL (ISOVUE-300) INJECTION 61%
INTRAVENOUS | Status: AC
  Administered 2016-01-14: 30 mL
  Filled 2016-01-14: qty 30

## 2016-01-14 MED ORDER — METRONIDAZOLE IN NACL 5-0.79 MG/ML-% IV SOLN
500.0000 mg | Freq: Three times a day (TID) | INTRAVENOUS | Status: DC
  Administered 2016-01-14 – 2016-01-16 (×6): 500 mg via INTRAVENOUS
  Filled 2016-01-14 (×6): qty 100

## 2016-01-14 MED ORDER — ALUM & MAG HYDROXIDE-SIMETH 200-200-20 MG/5ML PO SUSP
30.0000 mL | Freq: Four times a day (QID) | ORAL | Status: DC | PRN
  Administered 2016-01-14: 30 mL via ORAL
  Filled 2016-01-14: qty 30

## 2016-01-14 MED ORDER — POTASSIUM CHLORIDE 20 MEQ PO PACK
40.0000 meq | PACK | Freq: Once | ORAL | Status: AC
Start: 2016-01-14 — End: 2016-01-14
  Administered 2016-01-14: 40 meq via ORAL
  Filled 2016-01-14: qty 2

## 2016-01-14 MED ORDER — IOPAMIDOL (ISOVUE-300) INJECTION 61%
100.0000 mL | Freq: Once | INTRAVENOUS | Status: AC | PRN
  Administered 2016-01-14: 100 mL via INTRAVENOUS

## 2016-01-14 NOTE — Care Management Note (Signed)
Case Management Note  Patient Details  Name: SHELVY FERRITER MRN: [id number] Date of Birth: March 16, 1961   If discussed at Long Length of Stay Meetings, dates discussed:  01/14/2016    Sherald Barge, RN 01/14/2016, 12:25 PM

## 2016-01-14 NOTE — Progress Notes (Signed)
Radiology results called to Dr Posey Pronto, orders received,and given. Will continue to monitor patient.

## 2016-01-14 NOTE — Progress Notes (Addendum)
[facility] Progress Note  Patient: Deborah Murray [id number]   PCP: Deborah Passer, MD DOB: [birth year]   DOA: 01/06/2016   DOS: 01/14/2016   Date of Service: the patient was seen and examined on 01/14/2016  Brief hospital course: 34 yof with a past medical history of asthma, COPD, adrenal insufficiency, atrial fibrillation, chronic back pain, and a recent admission for Clostridium difficile presented with complaints of abdominal pain. She was admitted for further evaluation of clostridium difficile colitis. Condition is gradually improved with resolution of acute kidney injury. However she continues to have significant diarrhea. Anticipate discharge home once frequency has decreased. Currently further plan is Continue vancomycin.  Assessment and Plan: 1. C. difficile colitis So has some abdominal cramping more than yesterday. We'll get an x-ray abdomen. Tolerating oral diet. At 38 BM yesterday, she mentions every single time she has large bowel movement. We will request nursing to document quantity. Continue oral vancomycin, probiotics.  2. Acute kidney injury. Non-anion gap metabolic acidosis. Resolved. monitor daily because of ongoing diarrhea.  3. A. fib. On Xarelto. Continue Xarelto.  4. Chronic adrenal insufficiency. Currently stable. Continue prednisone.  5. Hypokalemia. Replacing.  Addendum: X ray abd shows possible pneumoperitoneum, Clinical picture does not suggest that. Discuss with surgery. CT abd pelvis performed. Negative for free air. But shows worsening colitis No evidence of toxic megacolon Add flagyl to vancomycin regimen If leucocytosis continues to worsen, may need ID.  [name] 6:36 PM 01/14/2016    Pain management: Continue home OxyIR, when necessary Tylenol Activity: Consulted physical therapy Bowel regimen: last BM 01/14/2016  Diet: Regular diet DVT Prophylaxis: on therapeutic anticoagulation.  Advance goals of care discussion:  Full code  Family Communication: family was present at bedside, at the time of interview. The pt provided permission to discuss medical plan with the family. Opportunity was given to ask question and all questions were answered satisfactorily.   Disposition:  Discharge to home. Expected discharge date: 01/16/2016   Consultants: none Procedures: none  Antibiotics: Anti-infectives    Start     Dose/Rate Route Frequency Ordered Stop   02/12/16 1600  vancomycin (VANCOCIN) 50 mg/mL oral solution 125 mg     125 mg Oral Every 3 DAYS 01/06/16 1758 02/27/16 1559   02/04/16 1600  vancomycin (VANCOCIN) 50 mg/mL oral solution 125 mg     125 mg Oral Every other day 01/06/16 1758 02/12/16 1559   01/28/16 1000  vancomycin (VANCOCIN) 50 mg/mL oral solution 125 mg     125 mg Oral Daily 01/06/16 1758 02/04/16 0959   01/20/16 2200  vancomycin (VANCOCIN) 50 mg/mL oral solution 125 mg     125 mg Oral 2 times daily 01/06/16 1758 01/27/16 2159   01/06/16 2200  vancomycin (VANCOCIN) 50 mg/mL oral solution 125 mg     125 mg Oral 4 times daily 01/06/16 1758 01/20/16 2159   01/06/16 1615  vancomycin (VANCOCIN) IVPB 1000 mg/200 mL premix     1,000 mg 200 mL/hr over 60 Minutes Intravenous  Once 01/06/16 1603 01/06/16 1741      Subjective: Continues to have loose watery bowel movement 15 times yesterday. Did have increase abdominal pain on the left side. Also complains about fact pain. Nutrition: Tolerating oral diet  Objective: Physical Exam: Vitals:   01/13/16 2133 01/14/16 0534 01/14/16 0748 01/14/16 0756  BP: 110/83 115/79    Pulse: 78 75    Resp: 18 18    Temp: 98.1 F (36.7 C) 98.3 F (36.8 C)  TempSrc: Oral Oral    SpO2: 100% 99% 98% 98%  Weight:      Height:       No intake or output data in the 24 hours ending 01/14/16 1200 Filed Weights   01/06/16 1153 01/06/16 1928  Weight: 99.8 kg (220 lb) 100.2 kg (220 lb 14.4 oz)    General: Alert, Awake and Oriented to Time, Place and Person.  Appear in mild distress, affect appropriate Eyes: PERRL, Conjunctiva normal ENT: Oral Mucosa clear moist. Cardiovascular: S1 and S2 Present, no Murmur, Respiratory: Bilateral Air entry equal and Decreased, no use of accessory muscle, Clear to Auscultation, no Crackles, no wheezes Abdomen: Bowel Sound present, Soft and mild tenderness Skin: no redness, no Rash, no induration Extremities: no Pedal edema, no calf tenderness Neurologic: Grossly no focal neuro deficit. Bilaterally Equal motor strength  Data Reviewed: CBC: No results for input(s): WBC, NEUTROABS, HGB, HCT, MCV, PLT in the last 168 hours. Basic Metabolic Panel:  Recent Labs Lab 01/09/16 0604 01/10/16 0622 01/14/16 0621  NA 132* 132* 137  K 3.4* 3.4* 3.2*  CL 94* 99* 108  CO2 28 25 23   GLUCOSE 110* 85 81  BUN 39* 33* 25*  CREATININE 1.58* 1.18* 1.01*  CALCIUM 8.1* 8.3* 8.4*  MG  --   --  2.0    Liver Function Tests: No results for input(s): AST, ALT, ALKPHOS, BILITOT, PROT, ALBUMIN in the last 168 hours. No results for input(s): LIPASE, AMYLASE in the last 168 hours. No results for input(s): AMMONIA in the last 168 hours. Coagulation Profile: No results for input(s): INR, PROTIME in the last 168 hours. Cardiac Enzymes: No results for input(s): CKTOTAL, CKMB, CKMBINDEX, TROPONINI in the last 168 hours. BNP (last 3 results) No results for input(s): PROBNP in the last 8760 hours.  CBG: No results for input(s): GLUCAP in the last 168 hours.  Studies: Dg Abd Portable 1v  Result Date: 01/14/2016 CLINICAL DATA:  Abdominal distention.  History of sepsis. EXAM: PORTABLE ABDOMEN - 1 VIEW COMPARISON:  11/07/2015 FINDINGS: Gas within the left upper quadrant of the abdomen is identified and is of on certain location. Cannot rule out pneumoperitoneum. No dilated loops of bowel identified. IMPRESSION: 1. Cannot rule out pneumoperitoneum. There is gas within the left abdomen. Cannot confirm intraluminal location. Recommend  further evaluation with acute abdomen series including upright images. 2. No abnormal bowel dilatation identified. These results will be called to the ordering clinician or representative by the Radiologist Assistant, and communication documented in the PACS or zVision Dashboard. Electronically Signed   By: Kerby Moors M.D.   On: 01/14/2016 11:54     Scheduled Meds: . atorvastatin  20 mg Oral QPM  . brimonidine  1 drop Both Eyes BID  . budesonide (PULMICORT) nebulizer solution  1 mg Nebulization BID  . calcium-vitamin D  1 tablet Oral Daily  . citalopram  20 mg Oral QHS  . feeding supplement (ENSURE ENLIVE)  237 mL Oral BID BM  . feeding supplement (PRO-STAT SUGAR FREE 64)  30 mL Oral BID  . furosemide  40 mg Oral Daily  . gabapentin  600 mg Oral TID  . Influenza vac split quadrivalent PF  0.5 mL Intramuscular Tomorrow-1000  . latanoprost  1 drop Both Eyes QHS  . montelukast  10 mg Oral QHS  . multivitamin with minerals  1 tablet Oral Daily  . predniSONE  15 mg Oral Daily  . rivaroxaban  20 mg Oral Q supper  . saccharomyces boulardii  250 mg Oral BID  . sodium chloride flush  3 mL Intravenous Q12H  . spironolactone  50 mg Oral Daily  . tiotropium  1 capsule Inhalation Daily  . vancomycin  125 mg Oral QID   Followed by  . [START ON 01/20/2016] vancomycin  125 mg Oral BID   Followed by  . [START ON 01/28/2016] vancomycin  125 mg Oral Daily   Followed by  . [START ON 02/04/2016] vancomycin  125 mg Oral QODAY   Followed by  . [START ON 02/12/2016] vancomycin  125 mg Oral Q3 days   Continuous Infusions:  PRN Meds: acetaminophen **OR** acetaminophen, albuterol, cyclobenzaprine, ondansetron **OR** ondansetron (ZOFRAN) IV, oxyCODONE  Time spent: 30 minutes  Author: Berle Mull, MD [facility] Pager: [phone] 01/14/2016 12:00 PM  If 7PM-7AM, please contact night-coverage at [email or web address], password [facility]

## 2016-01-15 LAB — BASIC METABOLIC PANEL
ANION GAP: 5 (ref 5–15)
BUN: 20 mg/dL (ref 6–20)
CO2: 24 mmol/L (ref 22–32)
Calcium: 8.1 mg/dL — ABNORMAL LOW (ref 8.9–10.3)
Chloride: 107 mmol/L (ref 101–111)
Creatinine, Ser: 0.84 mg/dL (ref 0.44–1.00)
GFR calc Af Amer: >60 mL/min (ref >60)
GLUCOSE: 77 mg/dL (ref 65–99)
POTASSIUM: 3.5 mmol/L (ref 3.5–5.1)
Sodium: 136 mmol/L (ref 135–145)

## 2016-01-15 LAB — CBC
HEMATOCRIT: 31.7 % — AB (ref 36.0–46.0)
HEMOGLOBIN: 10.1 g/dL — AB (ref 12.0–15.0)
MCH: 28.5 pg (ref 26.0–34.0)
MCHC: 31.9 g/dL (ref 30.0–36.0)
MCV: 89.3 fL (ref 78.0–100.0)
Platelets: 458 10*3/uL — ABNORMAL HIGH (ref 150–400)
RBC: 3.55 MIL/uL — ABNORMAL LOW (ref 3.87–5.11)
RDW: 14.7 % (ref 11.5–15.5)
WBC: 12.1 10*3/uL — ABNORMAL HIGH (ref 4.0–10.5)

## 2016-01-15 NOTE — Progress Notes (Signed)
[facility] Progress Note  Patient: Deborah Murray [id number]   PCP: Sol Passer, MD DOB: 1961/01/22   DOA: 01/06/2016   DOS: 01/15/2016   Date of Service: the patient was seen and examined on 01/15/2016  Brief hospital course: 44 yof with a past medical history of asthma, COPD, adrenal insufficiency, atrial fibrillation, chronic back pain, and a recent admission for Clostridium difficile presented with complaints of abdominal pain. She was admitted for further evaluation of clostridium difficile colitis. Condition is gradually improved with resolution of acute kidney injury. However she continues to have significant diarrhea. Anticipate discharge home once frequency has decreased. Currently further plan is Continue vancomycin.  Assessment and Plan: 1. C. difficile colitis Repeat CT scan from yesterday shows worsening colitis. Clinically the patient does appear to be improving. We'll continue vancomycin and probiotics for now. Anticipate discharge home tomorrow if she continues to improve.  2. Acute kidney injury. Non-anion gap metabolic acidosis. Resolved. monitor daily because of ongoing diarrhea.  3. A. fib. On Xarelto. Continue Xarelto.  4. Chronic adrenal insufficiency. Currently stable. Continue prednisone.  5. Hypokalemia. Replacing.    Pain management: Continue home OxyIR, when necessary Tylenol Activity: Consulted physical therapy Bowel regimen: last BM 01/15/2016  Diet: Soft diet DVT Prophylaxis: Xarelto  Advance goals of care discussion: Full code  Family Communication: No family present  Disposition:  Discharge to home. Expected discharge date: 01/16/2016   Consultants: none Procedures: none  Antibiotics: Anti-infectives    Start     Dose/Rate Route Frequency Ordered Stop   02/12/16 1600  vancomycin (VANCOCIN) 50 mg/mL oral solution 125 mg     125 mg Oral Every 3 DAYS 01/06/16 1758 02/27/16 1559   02/04/16 1600  vancomycin (VANCOCIN) 50  mg/mL oral solution 125 mg     125 mg Oral Every other day 01/06/16 1758 02/12/16 1559   01/28/16 1000  vancomycin (VANCOCIN) 50 mg/mL oral solution 125 mg     125 mg Oral Daily 01/06/16 1758 02/04/16 0959   01/20/16 2200  vancomycin (VANCOCIN) 50 mg/mL oral solution 125 mg     125 mg Oral 2 times daily 01/06/16 1758 01/27/16 2159   01/14/16 1730  metroNIDAZOLE (FLAGYL) IVPB 500 mg     500 mg 100 mL/hr over 60 Minutes Intravenous Every 8 hours 01/14/16 1718     01/06/16 2200  vancomycin (VANCOCIN) 50 mg/mL oral solution 125 mg     125 mg Oral 4 times daily 01/06/16 1758 01/20/16 2159   01/06/16 1615  vancomycin (VANCOCIN) IVPB 1000 mg/200 mL premix     1,000 mg 200 mL/hr over 60 Minutes Intravenous  Once 01/06/16 1603 01/06/16 1741      Subjective: Reports continued loose bowel movements. Has occasional abdominal pain. No vomiting.  Objective: Physical Exam: Vitals:   01/14/16 2016 01/15/16 0604 01/15/16 0714 01/15/16 1415  BP: 110/83 131/78  109/87  Pulse: 79 78  82  Resp: 16 16  18   Temp: 98.4 F (36.9 C) 98.7 F (37.1 C)  97.7 F (36.5 C)  TempSrc: Oral Oral  Oral  SpO2: 100% 100% 100% 100%  Weight:      Height:        Intake/Output Summary (Last 24 hours) at 01/15/16 1822 Last data filed at 01/15/16 0800  Gross per 24 hour  Intake              480 ml  Output  0 ml  Net              480 ml   Filed Weights   01/06/16 1153 01/06/16 1928  Weight: 99.8 kg (220 lb) 100.2 kg (220 lb 14.4 oz)    General: Alert, Awake and Oriented to Time, Place and Person. Appear in mild distress, affect appropriate Eyes: PERRL, Conjunctiva normal ENT: Oral Mucosa clear moist. Cardiovascular: S1 and S2 Present, no Murmur, Respiratory: Bilateral Air entry equal and Decreased, no use of accessory muscle, Clear to Auscultation, no Crackles, no wheezes Abdomen: Bowel Sound present, Soft and mild tenderness Skin: no redness, no Rash, no induration Extremities: 1-2+ Pedal  edema, no calf tenderness Neurologic: Grossly no focal neuro deficit. Bilaterally Equal motor strength  Data Reviewed: CBC:  Recent Labs Lab 01/14/16 1352 01/15/16 0618  WBC 14.6* 12.1*  NEUTROABS 13.4*  --   HGB 11.2* 10.1*  HCT 35.1* 31.7*  MCV 90.2 89.3  PLT 454* [id number]*   Basic Metabolic Panel:  Recent Labs Lab 01/09/16 0604 01/10/16 0622 01/14/16 0621 01/14/16 1352 01/15/16 0618  NA 132* 132* 137 134* 136  K 3.4* 3.4* 3.2* 3.9 3.5  CL 94* 99* 108 106 107  CO2 28 25 23 22 24   GLUCOSE 110* 85 81 112* 77  BUN 39* 33* 25* 23* 20  CREATININE 1.58* 1.18* 1.01* 1.01* 0.84  CALCIUM 8.1* 8.3* 8.4* 8.3* 8.1*  MG  --   --  2.0  --   --     Liver Function Tests:  Recent Labs Lab 01/14/16 1352  AST 25  ALT 19  ALKPHOS 66  BILITOT 0.1*  PROT 4.9*  ALBUMIN 2.3*   No results for input(s): LIPASE, AMYLASE in the last 168 hours. No results for input(s): AMMONIA in the last 168 hours. Coagulation Profile: No results for input(s): INR, PROTIME in the last 168 hours. Cardiac Enzymes: No results for input(s): CKTOTAL, CKMB, CKMBINDEX, TROPONINI in the last 168 hours. BNP (last 3 results) No results for input(s): PROBNP in the last 8760 hours.  CBG: No results for input(s): GLUCAP in the last 168 hours.  Studies: No results found.   Scheduled Meds: . atorvastatin  20 mg Oral QPM  . brimonidine  1 drop Both Eyes BID  . budesonide (PULMICORT) nebulizer solution  1 mg Nebulization BID  . calcium-vitamin D  1 tablet Oral Daily  . citalopram  20 mg Oral QHS  . feeding supplement (ENSURE ENLIVE)  237 mL Oral BID BM  . feeding supplement (PRO-STAT SUGAR FREE 64)  30 mL Oral BID  . furosemide  40 mg Oral Daily  . gabapentin  600 mg Oral TID  . Influenza vac split quadrivalent PF  0.5 mL Intramuscular Tomorrow-1000  . latanoprost  1 drop Both Eyes QHS  . metronidazole  500 mg Intravenous Q8H  . montelukast  10 mg Oral QHS  . multivitamin with minerals  1 tablet Oral  Daily  . predniSONE  15 mg Oral Daily  . rivaroxaban  20 mg Oral Q supper  . saccharomyces boulardii  250 mg Oral BID  . sodium chloride flush  3 mL Intravenous Q12H  . spironolactone  50 mg Oral Daily  . tiotropium  1 capsule Inhalation Daily  . vancomycin  125 mg Oral QID   Followed by  . [START ON 01/20/2016] vancomycin  125 mg Oral BID   Followed by  . [START ON 01/28/2016] vancomycin  125 mg Oral Daily   Followed by  . [  START ON 02/04/2016] vancomycin  125 mg Oral QODAY   Followed by  . [START ON 02/12/2016] vancomycin  125 mg Oral Q3 days   Continuous Infusions:  PRN Meds: acetaminophen **OR** acetaminophen, albuterol, alum & mag hydroxide-simeth, cyclobenzaprine, ondansetron **OR** ondansetron (ZOFRAN) IV, oxyCODONE  Time spent: 30 minutes  AuthorKathie Dike, MD [facility] Pager: [phone] 01/15/2016 6:22 PM  If 7PM-7AM, please contact night-coverage at [email or web address], password [facility]

## 2016-01-15 NOTE — Care Management Important Message (Signed)
Important Message  Patient Details  Name: Deborah Murray MRN: [id number] Date of Birth: 1960-10-24   Medicare Important Message Given:  Yes    Sherald Barge, RN 01/15/2016, 3:54 PM

## 2016-01-16 MED ORDER — FLORASTOR 250 MG PO CAPS
250.0000 mg | ORAL_CAPSULE | Freq: Two times a day (BID) | ORAL | 0 refills | Status: DC
Start: 2016-01-16 — End: 2016-03-25

## 2016-01-16 MED ORDER — VANCOMYCIN 50 MG/ML ORAL SOLUTION: ORAL | 0 refills | Status: DC

## 2016-01-16 NOTE — Care Management Note (Signed)
Case Management Note  Patient Details  Name: Deborah Murray MRN: [id number] Date of Birth: April 14, 1960  Expected Discharge Date:  01/11/16               Expected Discharge Plan:  [place]  In-House Referral:  NA  Discharge planning Services  CM Consult  Post Acute Care Choice:  [place], Resumption of Svcs/PTA Provider Choice offered to:  Patient  DME Arranged:    DME Agency:     HH Arranged:  RN, PT, Nurse's Aide [place] Agency:  [place]  Status of Service:  Completed, signed off  If discussed at [place] of Stay Meetings, dates discussed:  01/16/2016  Additional Comments: Pt discharging home today with resumption of [place] services through [facility]. Pt is aware HH has 48hrs to resume services. Romualdo Bolk, of [facility], is aware of DC home today. Pt knows the cost to her for PO vanc as she was recently on it. Pt's Rx's will be sent to pharmacy.   Sherald Barge, RN 01/16/2016, 11:37 AM

## 2016-01-16 NOTE — Discharge Summary (Signed)
Physician Discharge Summary  Deborah Murray [id number] DOB: Sep 06, 1960 DOA: 01/06/2016  PCP: Sol Passer, MD  Admit date: 01/06/2016 Discharge date: 01/16/2016  Admitted From: home Disposition:  home  Recommendations for Outpatient Follow-up:  1. Follow up with PCP in 1-2 weeks 2. Please obtain BMP/CBC in one week 3. ACE inhibitor discontinued due to renal failure. Resume as outpatient as appropriate 4. Nexium discontinued due to ongoing C diff  Home Health: Equipment/Devices:  Discharge Condition:stable CODE STATUS: full  Diet recommendation: Heart Healthy  Brief/Interim Summary: This is a 55 year old female who was recently in the hospital for Clostridium difficile colitis, presents to the hospital with complaints of abdominal pain and frequent diarrhea. She is on her current Clostridium difficile colitis. She also has significant dehydration and acute renal failure. She was admitted for further treatments  Discharge Diagnoses:  Principal Problem:   C. difficile colitis Active Problems:   OSA on CPAP   History of atrial fibrillation- NSR now   History of adrenal insufficiency   Metabolic acidosis   Hyponatremia   AKI (acute kidney injury) ([place])  Patient was treated with oral vancomycin for Clostridium difficile colitis. The course of her hospital stay, her diarrhea has improved. She is now only having 2 bowel movements a day. Is not having any abdominal pain, no nausea or vomiting. After discussion with infectious disease, it was determined that it would be best to treat the patient with vancomycin taper over 6 weeks. She will continue her probiotics. Proton pump inhibitors have been discontinued.  Regarding acute renal failure, this was related to dehydration. She was adequately hydrated with IV fluids and once her diarrhea resolves, renal function returned to normal. ACE inhibitor was held during her hospital stay. This can be resumed as an outpatient as clinically  appropriate. Since she has developed significant pedal edema with aggressive IV hydration. She will be restarted on Lasix. The remainder of her medical issues have remained stable.  Discharge Instructions  Discharge Instructions    Diet - low sodium heart healthy    Complete by:  As directed    Increase activity slowly    Complete by:  As directed        Medication List    STOP taking these medications   esomeprazole 40 MG capsule Commonly known as:  NEXIUM   lisinopril 10 MG tablet Commonly known as:  PRINIVIL,ZESTRIL   potassium chloride SA 20 MEQ tablet Commonly known as:  K-DUR,KLOR-CON   tiZANidine 4 MG tablet Commonly known as:  ZANAFLEX     TAKE these medications   albuterol 108 (90 Base) MCG/ACT inhaler Commonly known as:  PROVENTIL HFA;VENTOLIN HFA Inhale 2 puffs into the lungs every 4 (four) hours as needed for wheezing.   aluminum chloride 20 % external solution Commonly known as:  DRYSOL Apply 1 application topically at bedtime as needed (for sweating and applies under arms).   atorvastatin 20 MG tablet Commonly known as:  LIPITOR Take 20 mg by mouth every evening.   brimonidine 0.2 % ophthalmic solution Commonly known as:  ALPHAGAN Place 1 drop into both eyes 2 (two) times daily.   Calcium Carb-Ergocalciferol 500-200 MG-UNIT Tabs Take 1 tablet by mouth daily.   citalopram 20 MG tablet Commonly known as:  CELEXA Take 20 mg by mouth at bedtime.   clindamycin 1 % external solution Commonly known as:  CLEOCIN T Apply 1 application topically every evening. Applies to underarms   cyclobenzaprine 10 MG tablet Commonly known as:  FLEXERIL Take 10 mg by mouth every 8 (eight) hours as needed.   EPINEPHrine 0.15 MG/0.3ML injection Commonly known as:  EPIPEN JR Inject 0.15 mg into the muscle as needed.   feeding supplement (PRO-STAT SUGAR FREE 64) Liqd Take 30 mLs by mouth 2 (two) times daily.   FLORASTOR 250 MG capsule Generic drug:  saccharomyces  boulardii Take 1 capsule (250 mg total) by mouth 2 (two) times daily.   fluticasone 220 MCG/ACT inhaler Commonly known as:  FLOVENT HFA Inhale 2 puffs into the lungs every 4 (four) hours.   fluticasone 50 MCG/ACT nasal spray Commonly known as:  FLONASE Place 2 sprays in each nostril daily as needed for allergies   furosemide 40 MG tablet Commonly known as:  LASIX Take 40 mg by mouth daily.   gabapentin 600 MG tablet Commonly known as:  NEURONTIN Take 600 mg by mouth 3 (three) times daily.   ipratropium 0.02 % nebulizer solution Commonly known as:  ATROVENT Take 2.5 mLs by nebulization 4 (four) times daily as needed for wheezing or shortness of breath.   latanoprost 0.005 % ophthalmic solution Commonly known as:  XALATAN Place 1 drop into both eyes at bedtime.   loratadine 10 MG tablet Commonly known as:  CLARITIN Take 10 mg by mouth daily.   Melatonin 3 MG Tabs Take 3 mg by mouth at bedtime.   montelukast 10 MG tablet Commonly known as:  SINGULAIR Take 10 mg by mouth at bedtime.   multivitamin capsule Take 1 capsule by mouth daily.   nitroGLYCERIN 0.4 MG SL tablet Commonly known as:  NITROSTAT Place 0.4 mg under the tongue every 5 (five) minutes as needed for chest pain.   omalizumab 150 MG injection Commonly known as:  XOLAIR Inject 300 mg into the skin every 30 (thirty) days.   oxyCODONE 5 MG immediate release tablet Commonly known as:  Oxy IR/ROXICODONE Take 5-10 mg by mouth every 4 (four) hours as needed.   polyethylene glycol packet Commonly known as:  MIRALAX / GLYCOLAX Take 17 g by mouth daily as needed for mild constipation.   potassium chloride 10 MEQ CR capsule Commonly known as:  MICRO-K Take 10 mEq by mouth daily.   predniSONE 5 MG tablet Commonly known as:  DELTASONE Take 15 mg by mouth daily.   promethazine 25 MG tablet Commonly known as:  PHENERGAN Take 25 mg by mouth every 4 (four) hours as needed for nausea or vomiting.    rivaroxaban 20 MG Tabs tablet Commonly known as:  XARELTO Take 1 tablet (20 mg total) by mouth daily with supper.   SPIRIVA HANDIHALER 18 MCG inhalation capsule Generic drug:  tiotropium Place 1 capsule into inhaler and inhale daily.   spironolactone 50 MG tablet Commonly known as:  ALDACTONE Take 1 tablet (50 mg total) by mouth daily. What changed:  Another medication with the same name was removed. Continue taking this medication, and follow the directions you see here.   vancomycin 50 mg/mL oral solution Commonly known as:  VANCOCIN Take 125mg  po qid for 4days, then bid for 7days then daily for 7days then every other day for 7days then every 3 days for 14days   Vitamin D 2000 units tablet Take 2,000 Units by mouth daily.      Follow-up Information    [facility] .   Contact information: [place] [address] [phone]          Allergies  Allergen Reactions  .  Aspirin Shortness Of Breath and Palpitations  . Codeine Sulfate Shortness Of Breath and Palpitations  . Darvocet [Propoxyphene N-Acetaminophen] Shortness Of Breath and Palpitations  . Tramadol Shortness Of Breath  . Penicillins Other (See Comments)    Has patient had a PCN reaction causing immediate rash, facial/tongue/throat swelling, SOB or lightheadedness with hypotension: No Has patient had a PCN reaction causing severe rash involving mucus membranes or skin necrosis: No Has patient had a PCN reaction that required hospitalization No Has patient had a PCN reaction occurring within the last 10 years: Yes If all of the above answers are "NO", then may proceed with Cephalosporin use.     Consultations:     Procedures/Studies: Ct Abdomen Pelvis W Contrast  Result Date: 01/14/2016 CLINICAL DATA:  Abdominal pain and diarrhea. Reason C- diff infection. EXAM: CT ABDOMEN AND PELVIS WITH CONTRAST TECHNIQUE: Multidetector CT imaging of the abdomen and pelvis was  performed using the standard protocol following bolus administration of intravenous contrast. CONTRAST:  52mL ISOVUE-300 IOPAMIDOL (ISOVUE-300) INJECTION 61%, 171mL ISOVUE-300 IOPAMIDOL (ISOVUE-300) INJECTION 61% COMPARISON:  Radiograph 01/20/2016, CT of the abdomen and pelvis 11/26/2015 FINDINGS: Lower chest: Right greater than left lower lobe atelectasis. Calcific atherosclerotic disease of the coronary arteries. Small pericardial effusion. Hepatobiliary: No focal liver abnormality is seen. Status post cholecystectomy. There is mild persistent extrahepatic biliary ductal distention. Pancreas: Unremarkable. No pancreatic ductal dilatation or surrounding inflammatory changes. Spleen: Normal in size without focal abnormality. Adrenals/Urinary Tract: Adrenal glands are unremarkable. Kidneys are normal, without renal calculi, focal lesion, or hydronephrosis. Bladder is unremarkable. 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 No evidence of bowel obstruction. There is worsening diffuse colonic mucosal wall thickening, particularly worse in the cecum and proximal descending colon, with extensive pericolonic inflammatory stranding. Vascular/Lymphatic: No significant vascular findings are present. No enlarged abdominal or pelvic lymph nodes. Reproductive: Status post hysterectomy. No adnexal masses. Other: There is a small amount of free fluid in the abdomen, localized in perihepatic location. Small amount of free fluid is also seen in the pelvis. No free intra-abdominal gas or enhancing fluid collections to suggest abscess formation or bowel perforation. Musculoskeletal: No acute or significant osseous findings. L4-5 fusion is stable. There is a small fat containing right periumbilical anterior abdominal wall hernia. IMPRESSION: Increasing pan colonic mucosal thickening with pericolonic inflammatory changes, consistent with worsening colitis, particularly prominent at the cecum and  proximal descending colon. No evidence of abscess formation or free intra-abdominal gas. Interval development of small amount of free fluid in the abdomen and pelvis. Interval development of small pericardial effusion. Bibasilar atelectasis. Calcific atherosclerotic disease of the coronary arteries. Electronically Signed   By: Fidela Salisbury M.D.   On: 01/14/2016 16:18   Dg Abd Portable 1v  Result Date: 01/14/2016 CLINICAL DATA:  Abdominal distention.  History of sepsis. EXAM: PORTABLE ABDOMEN - 1 VIEW COMPARISON:  11/07/2015 FINDINGS: Gas within the left upper quadrant of the abdomen is identified and is of on certain location. Cannot rule out pneumoperitoneum. No dilated loops of bowel identified. IMPRESSION: 1. Cannot rule out pneumoperitoneum. There is gas within the left abdomen. Cannot confirm intraluminal location. Recommend further evaluation with acute abdomen series including upright images. 2. No abnormal bowel dilatation identified. These results will be called to the ordering clinician or representative by the Radiologist Assistant, and communication documented in the PACS or zVision Dashboard. Electronically Signed   By: Kerby Moors M.D.   On: 01/14/2016 11:54       Subjective:  Only had 2 bowel movements today. No abdominal pain. Feeling good  Discharge Exam: Vitals:   01/15/16 2200 01/16/16 0634  BP: (!) 127/94 (!) 132/101  Pulse: 84 80  Resp: 18 18  Temp: 98.3 F (36.8 C) 98.3 F (36.8 C)   Vitals:   01/15/16 1915 01/15/16 2200 01/16/16 0634 01/16/16 0727  BP:  (!) 127/94 (!) 132/101   Pulse:  84 80   Resp:  18 18   Temp:  98.3 F (36.8 C) 98.3 F (36.8 C)   TempSrc:  Oral Oral   SpO2: 98% 100% 100% 98%  Weight:      Height:        General: Pt is alert, awake, not in acute distress Cardiovascular: RRR, S1/S2 +, no rubs, no gallops Respiratory: CTA bilaterally, no wheezing, no rhonchi Abdominal: Soft, NT, ND, bowel sounds + Extremities: 1-2+ edema, no  cyanosis    The results of significant diagnostics from this hospitalization (including imaging, microbiology, ancillary and laboratory) are listed below for reference.     Microbiology: No results found for this or any previous visit (from the past 240 hour(s)).   Labs: BNP (last 3 results) No results for input(s): BNP in the last 8760 hours. Basic Metabolic Panel:  Recent Labs Lab 01/10/16 0622 01/14/16 0621 01/14/16 1352 01/15/16 0618  NA 132* 137 134* 136  K 3.4* 3.2* 3.9 3.5  CL 99* 108 106 107  CO2 25 23 22 24   GLUCOSE 85 81 112* 77  BUN 33* 25* 23* 20  CREATININE 1.18* 1.01* 1.01* 0.84  CALCIUM 8.3* 8.4* 8.3* 8.1*  MG  --  2.0  --   --    Liver Function Tests:  Recent Labs Lab 01/14/16 1352  AST 25  ALT 19  ALKPHOS 66  BILITOT 0.1*  PROT 4.9*  ALBUMIN 2.3*   No results for input(s): LIPASE, AMYLASE in the last 168 hours. No results for input(s): AMMONIA in the last 168 hours. CBC:  Recent Labs Lab 01/14/16 1352 01/15/16 0618  WBC 14.6* 12.1*  NEUTROABS 13.4*  --   HGB 11.2* 10.1*  HCT 35.1* 31.7*  MCV 90.2 89.3  PLT 454* 458*   Cardiac Enzymes: No results for input(s): CKTOTAL, CKMB, CKMBINDEX, TROPONINI in the last 168 hours. BNP: Invalid input(s): POCBNP CBG: No results for input(s): GLUCAP in the last 168 hours. D-Dimer No results for input(s): DDIMER in the last 72 hours. Hgb A1c No results for input(s): HGBA1C in the last 72 hours. Lipid Profile No results for input(s): CHOL, HDL, LDLCALC, TRIG, CHOLHDL, LDLDIRECT in the last 72 hours. Thyroid function studies No results for input(s): TSH, T4TOTAL, T3FREE, THYROIDAB in the last 72 hours.  Invalid input(s): FREET3 Anemia work up No results for input(s): VITAMINB12, FOLATE, FERRITIN, TIBC, IRON, RETICCTPCT in the last 72 hours. Urinalysis    Component Value Date/Time   COLORURINE AMBER (A) 11/10/2015 1000   APPEARANCEUR HAZY (A) 11/10/2015 1000   LABSPEC 1.020 11/10/2015 1000    PHURINE 5.5 11/10/2015 1000   GLUCOSEU NEGATIVE 11/10/2015 1000   HGBUR MODERATE (A) 11/10/2015 1000   BILIRUBINUR NEGATIVE 11/10/2015 1000   KETONESUR NEGATIVE 11/10/2015 1000   PROTEINUR TRACE (A) 11/10/2015 1000   UROBILINOGEN 1.0 05/12/2014 1319   NITRITE NEGATIVE 11/10/2015 1000   LEUKOCYTESUR NEGATIVE 11/10/2015 1000   Sepsis Labs Invalid input(s): PROCALCITONIN,  WBC,  LACTICIDVEN Microbiology No results found for this or any previous visit (from the past 240 hour(s)).   Time coordinating discharge: Over  30 minutes  SIGNED:   Kathie Dike, MD  [facility] 01/16/2016, 6:27 PM Pager   If 7PM-7AM, please contact night-coverage [email or web address] Password [email or web address]

## 2016-01-16 NOTE — Progress Notes (Signed)
Patient discharged home, PICC line removed. Patient sent home with belongings, prescriptions, and discharge papers.

## 2016-02-14 ENCOUNTER — Other Ambulatory Visit (HOSPITAL_COMMUNITY)
Admission: RE | Admit: 2016-02-14 | Discharge: 2016-02-14 | Disposition: A | Discharge status: Home / Self Care | Payer: Medicare Other | Source: Ambulatory Visit | Attending: Pediatrics | Admitting: Pediatrics

## 2016-02-14 DIAGNOSIS — I10 Essential (primary) hypertension: Secondary | ICD-10-CM | POA: Diagnosis present

## 2016-02-14 LAB — BASIC METABOLIC PANEL
ANION GAP: 3 — AB (ref 5–15)
BUN: 13 mg/dL (ref 6–20)
CALCIUM: 8.7 mg/dL — AB (ref 8.9–10.3)
CO2: 24 mmol/L (ref 22–32)
Chloride: 111 mmol/L (ref 101–111)
Creatinine, Ser: 0.98 mg/dL (ref 0.44–1.00)
Glucose, Bld: 91 mg/dL (ref 65–99)
Potassium: 3.9 mmol/L (ref 3.5–5.1)
SODIUM: 138 mmol/L (ref 135–145)

## 2016-03-19 ENCOUNTER — Emergency Department (HOSPITAL_COMMUNITY): Payer: Medicare Other

## 2016-03-19 ENCOUNTER — Encounter (HOSPITAL_COMMUNITY): Payer: Self-pay

## 2016-03-19 ENCOUNTER — Emergency Department (HOSPITAL_COMMUNITY)
Admission: EM | Admit: 2016-03-19 | Discharge: 2016-03-19 | Disposition: A | Discharge status: Home / Self Care | Payer: Medicare Other | Attending: Physician Assistant | Admitting: Physician Assistant

## 2016-03-19 DIAGNOSIS — W1839XA Other fall on same level, initial encounter: Secondary | ICD-10-CM | POA: Diagnosis not present

## 2016-03-19 DIAGNOSIS — S0081XA Abrasion of other part of head, initial encounter: Secondary | ICD-10-CM | POA: Diagnosis not present

## 2016-03-19 DIAGNOSIS — Y9289 Other specified places as the place of occurrence of the external cause: Secondary | ICD-10-CM | POA: Diagnosis not present

## 2016-03-19 DIAGNOSIS — S62317A Displaced fracture of base of fifth metacarpal bone. left hand, initial encounter for closed fracture: Secondary | ICD-10-CM | POA: Insufficient documentation

## 2016-03-19 DIAGNOSIS — D649 Anemia, unspecified: Secondary | ICD-10-CM | POA: Insufficient documentation

## 2016-03-19 DIAGNOSIS — S299XXA Unspecified injury of thorax, initial encounter: Secondary | ICD-10-CM | POA: Diagnosis present

## 2016-03-19 DIAGNOSIS — S2242XA Multiple fractures of ribs, left side, initial encounter for closed fracture: Secondary | ICD-10-CM | POA: Insufficient documentation

## 2016-03-19 DIAGNOSIS — I10 Essential (primary) hypertension: Secondary | ICD-10-CM | POA: Diagnosis not present

## 2016-03-19 DIAGNOSIS — R1084 Generalized abdominal pain: Secondary | ICD-10-CM | POA: Diagnosis not present

## 2016-03-19 DIAGNOSIS — Y999 Unspecified external cause status: Secondary | ICD-10-CM | POA: Diagnosis not present

## 2016-03-19 DIAGNOSIS — J449 Chronic obstructive pulmonary disease, unspecified: Secondary | ICD-10-CM | POA: Insufficient documentation

## 2016-03-19 DIAGNOSIS — Z7901 Long term (current) use of anticoagulants: Secondary | ICD-10-CM | POA: Diagnosis not present

## 2016-03-19 DIAGNOSIS — Z79899 Other long term (current) drug therapy: Secondary | ICD-10-CM | POA: Insufficient documentation

## 2016-03-19 DIAGNOSIS — Y9389 Activity, other specified: Secondary | ICD-10-CM | POA: Insufficient documentation

## 2016-03-19 DIAGNOSIS — Z23 Encounter for immunization: Secondary | ICD-10-CM | POA: Diagnosis not present

## 2016-03-19 DIAGNOSIS — W19XXXA Unspecified fall, initial encounter: Secondary | ICD-10-CM

## 2016-03-19 DIAGNOSIS — S6292XA Unspecified fracture of left wrist and hand, initial encounter for closed fracture: Secondary | ICD-10-CM

## 2016-03-19 LAB — CBC WITH DIFFERENTIAL/PLATELET
Basophils Absolute: 0.1 10*3/uL (ref 0.0–0.1)
Basophils Relative: 2 %
EOS ABS: 0 10*3/uL (ref 0.0–0.7)
Eosinophils Relative: 1 %
HEMATOCRIT: 36.6 % (ref 36.0–46.0)
HEMOGLOBIN: 11.4 g/dL — AB (ref 12.0–15.0)
LYMPHS ABS: 0.8 10*3/uL (ref 0.7–4.0)
LYMPHS PCT: 21 %
MCH: 27.5 pg (ref 26.0–34.0)
MCHC: 31.1 g/dL (ref 30.0–36.0)
MCV: 88.2 fL (ref 78.0–100.0)
MONOS PCT: 8 %
Monocytes Absolute: 0.3 10*3/uL (ref 0.1–1.0)
NEUTROS ABS: 2.7 10*3/uL (ref 1.7–7.7)
NEUTROS PCT: 68 %
Platelets: 288 10*3/uL (ref 150–400)
RBC: 4.15 MIL/uL (ref 3.87–5.11)
RDW: 15.2 % (ref 11.5–15.5)
WBC: 3.9 10*3/uL — ABNORMAL LOW (ref 4.0–10.5)

## 2016-03-19 LAB — BASIC METABOLIC PANEL
Anion gap: 7 (ref 5–15)
BUN: 10 mg/dL (ref 6–20)
CHLORIDE: 112 mmol/L — AB (ref 101–111)
CO2: 23 mmol/L (ref 22–32)
CREATININE: 1.13 mg/dL — AB (ref 0.44–1.00)
Calcium: 8.7 mg/dL — ABNORMAL LOW (ref 8.9–10.3)
GFR calc Af Amer: >60 mL/min (ref >60)
GFR calc non Af Amer: 53 mL/min — ABNORMAL LOW (ref >60)
GLUCOSE: 100 mg/dL — AB (ref 65–99)
POTASSIUM: 4 mmol/L (ref 3.5–5.1)
Sodium: 142 mmol/L (ref 135–145)

## 2016-03-19 MED ORDER — ONDANSETRON HCL 4 MG PO TABS
4.0000 mg | ORAL_TABLET | Freq: Once | ORAL | Status: AC
Start: 2016-03-19 — End: 2016-03-19
  Administered 2016-03-19: 4 mg via ORAL
  Filled 2016-03-19: qty 1

## 2016-03-19 MED ORDER — OXYCODONE-ACETAMINOPHEN 5-325 MG PO TABS: 1.0000 | ORAL_TABLET | Freq: Four times a day (QID) | ORAL | 0 refills | Status: DC | PRN

## 2016-03-19 MED ORDER — TETANUS-DIPHTH-ACELL PERTUSSIS 5-2.5-18.5 LF-MCG/0.5 IM SUSP
0.5000 mL | Freq: Once | INTRAMUSCULAR | Status: AC
  Administered 2016-03-19: 0.5 mL via INTRAMUSCULAR
  Filled 2016-03-19: qty 0.5

## 2016-03-19 MED ORDER — OXYCODONE-ACETAMINOPHEN 5-325 MG PO TABS
1.0000 | ORAL_TABLET | Freq: Once | ORAL | Status: AC
Start: 2016-03-19 — End: 2016-03-19
  Administered 2016-03-19: 1 via ORAL
  Filled 2016-03-19: qty 1

## 2016-03-19 MED ORDER — IOPAMIDOL (ISOVUE-300) INJECTION 61%
INTRAVENOUS | Status: AC
  Administered 2016-03-19: 100 mL
  Filled 2016-03-19: qty 100

## 2016-03-19 MED ORDER — MORPHINE SULFATE (PF) 4 MG/ML IV SOLN
4.0000 mg | Freq: Once | INTRAVENOUS | Status: AC
  Administered 2016-03-19: 4 mg via INTRAVENOUS
  Filled 2016-03-19: qty 1

## 2016-03-19 NOTE — ED Notes (Signed)
EDP at bedside  

## 2016-03-19 NOTE — ED Notes (Signed)
Pt transported to XR.  

## 2016-03-19 NOTE — ED Notes (Signed)
Pt returned from CT °

## 2016-03-19 NOTE — Discharge Instructions (Signed)
Please make sure you take plenty of pain medication to make sure that you take big deep breaths. You are at risk for getting pneumonia, please return if you have any concerns.

## 2016-03-19 NOTE — ED Provider Notes (Signed)
[facility] DEPT Provider Note   CSN: [id number] Arrival date & time: 03/19/16  1444     History   Chief Complaint Chief Complaint  Patient presents with  . Fall    HPI TERASA MCEUEN is a 55 y.o. female.  HPI Patient was feeling well until one hour ago when she was getting out of her car lost her balance and fell onto concrete striking her left ribs. She complains of left rib pain and pain at left hand since the event. She also suffered a scrape to her right face as result of the fall. No loss consciousness no neck pain no facial pain no headache. No treatment prior to coming here. Pain is moderate to severe. Not made better or worse by anything. No shortness of breath. No other associated symptoms Past Medical History:  Diagnosis Date  . Adrenal abnormality ([place])   . Adrenal insufficiency ([place]) 2013   Tx at [facility]  . Asthma   . Atrial fibrillation ([place])   . Chronic back pain   . COPD (chronic obstructive pulmonary disease) ([place])   . Glaucoma   . Headache   . Hyperlipidemia   . Hypertension   . Lumbar radiculopathy   . Sepsis ([place]) 11/04/2015  . Syncope and collapse 05/11/11  adrenal insuffiency  treated [facility]  . Thyroid disease   . Vocal cord dysfunction     Patient Active Problem List   Diagnosis Date Noted  . Anasarca 11/18/2015  . Generalized abdominal pain   . Protein-calorie malnutrition, severe 11/05/2015  . Sepsis ([place]) 11/04/2015  . AKI (acute kidney injury) ([place])   . Pyrexia   . Acute kidney injury ([place]) 11/02/2015  . Hyponatremia 11/02/2015  . Metabolic acidosis [id number]  . Hypocalcemia 11/01/2015  . C. difficile colitis 10/31/2015  . Pancolitis ([place]) 10/30/2015  . C. difficile diarrhea 10/30/2015  . Abdominal pain in female 10/30/2015  . Hypotension 10/30/2015  . History of atrial fibrillation- NSR now 10/30/2015  . History of adrenal insufficiency 10/30/2015  . History of hypertension 10/30/2015  . Esophageal reflux   . Nausea   .  GERD (gastroesophageal reflux disease) 08/28/2015  . Nausea without vomiting 08/28/2015  . Abdominal pain, epigastric 08/28/2015  . Dark stools 08/28/2015  . Atrial fibrillation with RVR ([place]) 05/12/2014  . Hypokalemia 05/12/2014  . Essential hypertension 05/12/2014  . Severe persistent asthma 05/12/2014  . OSA on CPAP 05/12/2014  . Toxic nodular goiter 05/12/2014  . Morbid obesity ([place]) 05/12/2014  . Pain in joint, shoulder region 08/02/2013  . Muscle weakness (generalized) 08/02/2013  . Decreased range of motion of left shoulder 08/02/2013  . Tight fascia 08/02/2013    Past Surgical History:  Procedure Laterality Date  . ABDOMINAL HYSTERECTOMY    . BACK SURGERY     disc   . CARDIAC CATHETERIZATION  2012 at [facility]  . CHOLECYSTECTOMY    . CHOLECYSTECTOMY, LAPAROSCOPIC    . ESOPHAGOGASTRODUODENOSCOPY N/A 09/17/2015   Procedure: ESOPHAGOGASTRODUODENOSCOPY (EGD);  Surgeon: Daneil Dolin, MD;  Location: [facility] ENDO SU[facility];  Service: Endoscopy;  Laterality: [id number]  . eye surg for glaucoma    . gastic by-pass  05-11-11   Gastric by-pass in 2008  . right knee surg      OB History    No data available       Home Medications    Prior to Admission medications   Medication Sig Start Date End Date Taking? Authorizing Provider  albuterol (PROVENTIL HFA;VENTOLIN HFA)  108 (90 BASE) MCG/ACT inhaler Inhale 2 puffs into the lungs every 4 (four) hours as needed for wheezing.     Historical Provider, MD  aluminum chloride (DRYSOL) 20 % external solution Apply 1 application topically at bedtime as needed (for sweating and applies under arms).     Historical Provider, MD  Amino Acids-Protein Hydrolys (FEEDING SUPPLEMENT, PRO-STAT SUGAR FREE 64,) LIQD Take 30 mLs by mouth 2 (two) times daily. 11/19/15   Samuella Cota, MD  atorvastatin (LIPITOR) 20 MG tablet Take 20 mg by mouth every evening.     Historical Provider, MD  brimonidine (ALPHAGAN) 0.2 % ophthalmic solution Place 1 drop into both  eyes 2 (two) times daily. 09/05/15   Historical Provider, MD  Calcium Carb-Ergocalciferol 500-200 MG-UNIT TABS Take 1 tablet by mouth daily.    Historical Provider, MD  Cholecalciferol (VITAMIN D) 2000 UNITS tablet Take 2,000 Units by mouth daily.    Historical Provider, MD  citalopram (CELEXA) 20 MG tablet Take 20 mg by mouth at bedtime.     Historical Provider, MD  clindamycin (CLEOCIN T) 1 % external solution Apply 1 application topically every evening. Applies to underarms 11/08/09   Historical Provider, MD  cyclobenzaprine (FLEXERIL) 10 MG tablet Take 10 mg by mouth every 8 (eight) hours as needed. 10/21/15   Historical Provider, MD  EPINEPHrine (EPIPEN JR) 0.15 MG/0.3ML injection Inject 0.15 mg into the muscle as needed. 06/11/15   Historical Provider, MD  FLORASTOR 250 MG capsule Take 1 capsule (250 mg total) by mouth 2 (two) times daily. 01/16/16   Kathie Dike, MD  fluticasone (FLONASE) 50 MCG/ACT nasal spray Place 2 sprays in each nostril daily as needed for allergies 06/12/15   Historical Provider, MD  fluticasone (FLOVENT HFA) 220 MCG/ACT inhaler Inhale 2 puffs into the lungs every 4 (four) hours. 06/24/09   Historical Provider, MD  furosemide (LASIX) 40 MG tablet Take 40 mg by mouth daily. 12/09/15   Historical Provider, MD  gabapentin (NEURONTIN) 600 MG tablet Take 600 mg by mouth 3 (three) times daily. 12/09/15   Historical Provider, MD  ipratropium (ATROVENT) 0.02 % nebulizer solution Take 2.5 mLs by nebulization 4 (four) times daily as needed for wheezing or shortness of breath.  06/16/15   Historical Provider, MD  latanoprost (XALATAN) 0.005 % ophthalmic solution Place 1 drop into both eyes at bedtime.    Historical Provider, MD  loratadine (CLARITIN) 10 MG tablet Take 10 mg by mouth daily. 12/09/15   Historical Provider, MD  Melatonin 3 MG TABS Take 3 mg by mouth at bedtime.    Historical Provider, MD  montelukast (SINGULAIR) 10 MG tablet Take 10 mg by mouth at bedtime.    Historical  Provider, MD  Multiple Vitamin (MULTIVITAMIN) capsule Take 1 capsule by mouth daily.    Historical Provider, MD  nitroGLYCERIN (NITROSTAT) 0.4 MG SL tablet Place 0.4 mg under the tongue every 5 (five) minutes as needed for chest pain.    Historical Provider, MD  omalizumab Arvid Right) 150 MG injection Inject 300 mg into the skin every 30 (thirty) days. 06/11/15   Historical Provider, MD  oxyCODONE (OXY IR/ROXICODONE) 5 MG immediate release tablet Take 5-10 mg by mouth every 4 (four) hours as needed. 12/27/15   Historical Provider, MD  polyethylene glycol (MIRALAX / GLYCOLAX) packet Take 17 g by mouth daily as needed for mild constipation.  10/21/15   Historical Provider, MD  potassium chloride (MICRO-K) 10 MEQ CR capsule Take 10 mEq by mouth daily. 12/09/15  Historical Provider, MD  predniSONE (DELTASONE) 5 MG tablet Take 15 mg by mouth daily. 12/09/15   Historical Provider, MD  promethazine (PHENERGAN) 25 MG tablet Take 25 mg by mouth every 4 (four) hours as needed for nausea or vomiting.  12/17/15   Historical Provider, MD  rivaroxaban (XARELTO) 20 MG TABS tablet Take 1 tablet (20 mg total) by mouth daily with supper. 05/13/14   Modena Jansky, MD  spironolactone (ALDACTONE) 50 MG tablet Take 1 tablet (50 mg total) by mouth daily. 11/19/15   Samuella Cota, MD  tiotropium (SPIRIVA HANDIHALER) 18 MCG inhalation capsule Place 1 capsule into inhaler and inhale daily. 12/09/09   Historical Provider, MD  vancomycin (VANCOCIN) 50 mg/mL oral solution Take 125mg  po qid for 4days, then bid for 7days then daily for 7days then every other day for 7days then every 3 days for 14days 01/16/16   Kathie Dike, MD    Family History Family History  Problem Relation Age of Onset  . Heart attack Mother   . Hypertension Mother   . Heart attack Father   . Asthma Father   . Hyperlipidemia Father   . Hypertension Father   . Heart attack Sister   . Arrhythmia Sister   . Asthma Sister   . Hyperlipidemia Sister   .  Hypertension Sister   . Asthma Brother   . Hypertension Brother     Social History Social History  Substance Use Topics  . Smoking status: Never Smoker  . Smokeless tobacco: Never Used  . Alcohol use No     Allergies   Aspirin; Codeine sulfate; Darvocet [propoxyphene n-acetaminophen]; Tramadol; and Penicillins   Review of Systems Review of Systems  Constitutional: Negative.   HENT: Negative.   Respiratory: Negative.   Cardiovascular: Positive for chest pain.       Left rib pain  Gastrointestinal: Negative.   Musculoskeletal: Negative.   Skin: Positive for wound.       Abrasions  Neurological: Negative.   Hematological: Bruises/bleeds easily.  Psychiatric/Behavioral: Negative.   All other systems reviewed and are negative.    Physical Exam Updated Vital Signs BP (!) 143/103   Pulse (!) 53   Temp 98.1 F (36.7 C) (Oral)   Resp 18   SpO2 100%   Physical Exam  Constitutional: She appears well-developed and well-nourished. No distress.  HENT:  Right Ear: External ear normal.  Left Ear: External ear normal.  Dime-sized abrasion over left cheek with no corresponding tenderness and soft tissue swelling. Otherwise normocephalic atraumatic  Eyes: Conjunctivae are normal. Pupils are equal, round, and reactive to light.  Neck: Neck supple. No tracheal deviation present. No thyromegaly present.  No tenderness  Cardiovascular: Regular rhythm.   No murmur heard. Mildly bradycardic  Pulmonary/Chest: Effort normal and breath sounds normal. She exhibits tenderness.  Tender at left ribs in midclavicular line evidence or flail no ecchymosis  Abdominal: Soft. Bowel sounds are normal. She exhibits no distension. There is no tenderness.  Obese, no ecchymosis mild left upper quadrant tenderness  Musculoskeletal: Normal range of motion. She exhibits no edema or tenderness.  Entire spine is nontender. Pelvis stable nontender. Left upper extremity tender and swollen at ulnar aspect  of hand. Skin intact. All digits with full range of motion. Radial pulse 2+. Good capillary refill. All other extremities without contusion abrasion or tenderness neurovascularly intact  Neurological: She is alert. She exhibits normal muscle tone. Coordination normal.  Skin: Skin is warm and dry. Capillary refill takes less  than 2 seconds. No rash noted.  Psychiatric: She has a normal mood and affect. Her behavior is normal.  Nursing note and vitals reviewed.    ED Treatments / Results  Labs (all labs ordered are listed, but only abnormal results are displayed) Labs Reviewed  CBC WITH DIFFERENTIAL/PLATELET  BASIC METABOLIC PANEL    EKG  EKG Interpretation None       Radiology No results found.  Procedures Procedures (including critical care time)  Medications Ordered in ED Medications  morphine 4 MG/ML injection 4 mg (not administered)  Tdap (BOOSTRIX) injection 0.5 mL (not administered)     Results for orders placed or performed during the hospital encounter of 03/19/16  CBC with Differential/Platelet  Result Value Ref Range   WBC 3.9 (L) 4.0 - 10.5 K/uL   RBC 4.15 3.87 - 5.11 MIL/uL   Hemoglobin 11.4 (L) 12.0 - 15.0 g/dL   HCT 36.6 36.0 - 46.0 %   MCV 88.2 78.0 - 100.0 fL   MCH 27.5 26.0 - 34.0 pg   MCHC 31.1 30.0 - 36.0 g/dL   RDW 15.2 11.5 - 15.5 %   Platelets 288 150 - 400 K/uL   Neutrophils Relative % 68 %   Neutro Abs 2.7 1.7 - 7.7 K/uL   Lymphocytes Relative 21 %   Lymphs Abs 0.8 0.7 - 4.0 K/uL   Monocytes Relative 8 %   Monocytes Absolute 0.3 0.1 - 1.0 K/uL   Eosinophils Relative 1 %   Eosinophils Absolute 0.0 0.0 - 0.7 K/uL   Basophils Relative 2 %   Basophils Absolute 0.1 0.0 - 0.1 K/uL  Basic metabolic panel  Result Value Ref Range   Sodium 142 135 - 145 mmol/L   Potassium 4.0 3.5 - 5.1 mmol/L   Chloride 112 (H) 101 - 111 mmol/L   CO2 23 22 - 32 mmol/L   Glucose, Bld 100 (H) 65 - 99 mg/dL   BUN 10 6 - 20 mg/dL   Creatinine, Ser 1.13 (H)  0.44 - 1.00 mg/dL   Calcium 8.7 (L) 8.9 - 10.3 mg/dL   GFR calc non Af Amer 53 (L) >60 mL/min   GFR calc Af Amer >60 >60 mL/min   Anion gap 7 5 - 15   Dg Ribs Unilateral W/chest Left  Result Date: 03/19/2016 CLINICAL DATA:  Patient fell.  Pain. EXAM: LEFT RIBS AND CHEST - 3+ VIEW COMPARISON:  None. FINDINGS: Increased body habitus contributes to poor rib detail. Suspected anterior lateral ninth and tenth LEFT-sided rib fractures, not significantly displaced. No effusion or pneumothorax. Osteopenia. Healed RIGHT-sided rib fractures. Cardiomegaly. Mild vascular congestion low lung volumes. Slight bibasilar atelectasis or scarring. No active infiltrates or failure. Previous lumbar fusion incompletely seen with pedicle screws. IMPRESSION: Suspected anterior lateral LEFT ninth and tenth rib fractures. No pneumothorax or effusion. Cardiomegaly without active infiltrates or failure. Slight bibasilar atelectasis for scoring. Electronically Signed   By: Staci Righter M.D.   On: 03/19/2016 16:22   Dg Hand Complete Left  Result Date: 03/19/2016 CLINICAL DATA:  Pain and tenderness after fall. EXAM: LEFT HAND - COMPLETE 3+ VIEW COMPARISON:  None. FINDINGS: There is a fracture through the base of the left fifth metacarpal. Slight angulation and displacement. No subluxation or dislocation. IMPRESSION: Mildly displaced and angulated fracture the base of the left fifth metacarpal. Electronically Signed   By: Rolm Baptise M.D.   On: 03/19/2016 16:19   Initial Impression / Assessment and Plan / ED Course  I have reviewed  the triage vital signs and the nursing notes.  Pertinent labs & imaging results that were available during my care of the patient were reviewed by me and considered in my medical decision making (see chart for details).  Clinical Course    Patient signed out to Dr.Mackuen at 5:55 PM. X-rays viewed by me. CT scan is pending. Anemia is chronic and stable   Final Clinical Impressions(s) / ED  Diagnoses  Dx #1 fall #2 left rib fractures #3 closed fracture of left fifth metacarpal #4 facial abrasion #5 anemia Final diagnoses:  None    New Prescriptions New Prescriptions   No medications on file     Orlie Dakin, MD 03/19/16 1758

## 2016-03-19 NOTE — ED Notes (Signed)
Ortho tech called regarding ulnar splint.

## 2016-03-19 NOTE — ED Triage Notes (Addendum)
Pt reports she was getting out of her car and lost her balance. She fell and now has a small abrasion to the left side of her face, pain in the left hand as well as the left ribcage. No LOC.

## 2016-03-19 NOTE — Progress Notes (Signed)
Orthopedic Tech Progress Note Patient Details:  Deborah Murray 09-02-1960 [id number]  Ortho Devices Type of Ortho Device: Ace wrap, Ulna gutter splint Ortho Device/Splint Interventions: Application   [name] 03/19/2016, 7:04 PM

## 2016-03-19 NOTE — ED Notes (Signed)
Upon entering room, pt eating potato chips. Advised pt she should not be eating until the EDP has received her results and cleared her to eat. Pt verbalized understanding.

## 2016-03-19 NOTE — ED Notes (Signed)
Pt given incentive spirometer and taught proper use. Pt verbalized understanding and demonstrated correct use of the incentive spirometer.

## 2016-03-19 NOTE — ED Notes (Signed)
Before administering order morphine, pt reports she wants to call someone in order to get a ride home. Will return to administer medication when pt states she is comfortable taking it.

## 2016-03-24 ENCOUNTER — Other Ambulatory Visit: Payer: Self-pay | Admitting: Orthopedic Surgery

## 2016-03-25 ENCOUNTER — Encounter (HOSPITAL_BASED_OUTPATIENT_CLINIC_OR_DEPARTMENT_OTHER): Payer: Self-pay | Admitting: *Deleted

## 2016-03-26 ENCOUNTER — Ambulatory Visit (HOSPITAL_BASED_OUTPATIENT_CLINIC_OR_DEPARTMENT_OTHER)
Admission: RE | Admit: 2016-03-26 | Discharge: 2016-03-26 | Disposition: A | Discharge status: Home / Self Care | Payer: Medicare Other | Source: Ambulatory Visit | Attending: Orthopedic Surgery | Admitting: Orthopedic Surgery

## 2016-03-26 ENCOUNTER — Encounter (HOSPITAL_BASED_OUTPATIENT_CLINIC_OR_DEPARTMENT_OTHER)
Admission: RE | Disposition: A | Discharge status: Home / Self Care | Payer: Self-pay | Source: Ambulatory Visit | Attending: Orthopedic Surgery

## 2016-03-26 ENCOUNTER — Ambulatory Visit (HOSPITAL_BASED_OUTPATIENT_CLINIC_OR_DEPARTMENT_OTHER): Payer: Medicare Other | Admitting: Anesthesiology

## 2016-03-26 ENCOUNTER — Encounter (HOSPITAL_BASED_OUTPATIENT_CLINIC_OR_DEPARTMENT_OTHER): Payer: Self-pay | Admitting: Anesthesiology

## 2016-03-26 DIAGNOSIS — K219 Gastro-esophageal reflux disease without esophagitis: Secondary | ICD-10-CM | POA: Insufficient documentation

## 2016-03-26 DIAGNOSIS — E785 Hyperlipidemia, unspecified: Secondary | ICD-10-CM | POA: Diagnosis not present

## 2016-03-26 DIAGNOSIS — I10 Essential (primary) hypertension: Secondary | ICD-10-CM | POA: Diagnosis not present

## 2016-03-26 DIAGNOSIS — Z7901 Long term (current) use of anticoagulants: Secondary | ICD-10-CM | POA: Insufficient documentation

## 2016-03-26 DIAGNOSIS — S62317A Displaced fracture of base of fifth metacarpal bone. left hand, initial encounter for closed fracture: Secondary | ICD-10-CM | POA: Insufficient documentation

## 2016-03-26 DIAGNOSIS — W1789XA Other fall from one level to another, initial encounter: Secondary | ICD-10-CM | POA: Insufficient documentation

## 2016-03-26 DIAGNOSIS — Z79899 Other long term (current) drug therapy: Secondary | ICD-10-CM | POA: Diagnosis not present

## 2016-03-26 DIAGNOSIS — J449 Chronic obstructive pulmonary disease, unspecified: Secondary | ICD-10-CM | POA: Insufficient documentation

## 2016-03-26 DIAGNOSIS — Z6841 Body Mass Index (BMI) 40.0 and over, adult: Secondary | ICD-10-CM | POA: Insufficient documentation

## 2016-03-26 DIAGNOSIS — E039 Hypothyroidism, unspecified: Secondary | ICD-10-CM | POA: Insufficient documentation

## 2016-03-26 DIAGNOSIS — I4891 Unspecified atrial fibrillation: Secondary | ICD-10-CM | POA: Diagnosis not present

## 2016-03-26 DIAGNOSIS — E2740 Unspecified adrenocortical insufficiency: Secondary | ICD-10-CM | POA: Diagnosis not present

## 2016-03-26 DIAGNOSIS — Z880 Allergy status to penicillin: Secondary | ICD-10-CM | POA: Insufficient documentation

## 2016-03-26 HISTORY — PX: CLOSED REDUCTION METACARPAL WITH PERCUTANEOUS PINNING: SHX5613

## 2016-03-26 SURGERY — CLOSED REDUCTION, FRACTURE, METACARPAL BONE, WITH PERCUTANEOUS PINNING
Anesthesia: General | Laterality: Left

## 2016-03-26 MED ORDER — FENTANYL CITRATE (PF) 100 MCG/2ML IJ SOLN: 50.0000 ug | INTRAMUSCULAR | Status: DC | PRN

## 2016-03-26 MED ORDER — BUPIVACAINE HCL (PF) 0.25 % IJ SOLN
INTRAMUSCULAR | Status: DC | PRN
  Administered 2016-03-26: 16 mL

## 2016-03-26 MED ORDER — PROMETHAZINE HCL 25 MG/ML IJ SOLN: 6.2500 mg | INTRAMUSCULAR | Status: DC | PRN

## 2016-03-26 MED ORDER — LABETALOL HCL 5 MG/ML IV SOLN: 10.0000 mg | INTRAVENOUS | Status: DC | PRN

## 2016-03-26 MED ORDER — MIDAZOLAM HCL 2 MG/2ML IJ SOLN
INTRAMUSCULAR | Status: AC
  Filled 2016-03-26: qty 2

## 2016-03-26 MED ORDER — SCOPOLAMINE 1 MG/3DAYS TD PT72: 1.0000 | MEDICATED_PATCH | Freq: Once | TRANSDERMAL | Status: DC | PRN

## 2016-03-26 MED ORDER — CHLORHEXIDINE GLUCONATE 4 % EX LIQD: 60.0000 mL | Freq: Once | CUTANEOUS | Status: DC

## 2016-03-26 MED ORDER — OXYCODONE-ACETAMINOPHEN 5-325 MG PO TABS: ORAL_TABLET | ORAL | 0 refills | Status: DC

## 2016-03-26 MED ORDER — BUPIVACAINE HCL (PF) 0.25 % IJ SOLN
INTRAMUSCULAR | Status: AC
  Filled 2016-03-26: qty 30

## 2016-03-26 MED ORDER — GLYCOPYRROLATE 0.2 MG/ML IV SOSY
PREFILLED_SYRINGE | INTRAVENOUS | Status: DC | PRN
Start: 2016-03-26 — End: 2016-03-26
  Administered 2016-03-26: .2 mg via INTRAVENOUS

## 2016-03-26 MED ORDER — LABETALOL HCL 5 MG/ML IV SOLN
5.0000 mg | INTRAVENOUS | Status: DC | PRN
  Administered 2016-03-26 (×2): 5 mg via INTRAVENOUS

## 2016-03-26 MED ORDER — ONDANSETRON HCL 4 MG/2ML IJ SOLN
INTRAMUSCULAR | Status: AC
  Filled 2016-03-26: qty 2

## 2016-03-26 MED ORDER — LIDOCAINE 2% (20 MG/ML) 5 ML SYRINGE
INTRAMUSCULAR | Status: AC
  Filled 2016-03-26: qty 5

## 2016-03-26 MED ORDER — ONDANSETRON HCL 4 MG/2ML IJ SOLN
INTRAMUSCULAR | Status: DC | PRN
  Administered 2016-03-26: 4 mg via INTRAVENOUS

## 2016-03-26 MED ORDER — DEXAMETHASONE SODIUM PHOSPHATE 10 MG/ML IJ SOLN
INTRAMUSCULAR | Status: AC
  Filled 2016-03-26: qty 1

## 2016-03-26 MED ORDER — OXYCODONE-ACETAMINOPHEN 5-325 MG PO TABS
1.0000 | ORAL_TABLET | Freq: Once | ORAL | Status: AC
  Administered 2016-03-26: 1 via ORAL

## 2016-03-26 MED ORDER — DEXAMETHASONE SODIUM PHOSPHATE 4 MG/ML IJ SOLN
INTRAMUSCULAR | Status: DC | PRN
  Administered 2016-03-26: 10 mg via INTRAVENOUS

## 2016-03-26 MED ORDER — PROPOFOL 500 MG/50ML IV EMUL
INTRAVENOUS | Status: AC
  Filled 2016-03-26: qty 50

## 2016-03-26 MED ORDER — MIDAZOLAM HCL 2 MG/2ML IJ SOLN
1.0000 mg | INTRAMUSCULAR | Status: DC | PRN
  Administered 2016-03-26: 1 mg via INTRAVENOUS

## 2016-03-26 MED ORDER — VANCOMYCIN HCL IN DEXTROSE 1-5 GM/200ML-% IV SOLN
INTRAVENOUS | Status: AC
Start: 2016-03-26 — End: 2016-03-26
  Filled 2016-03-26: qty 200

## 2016-03-26 MED ORDER — FENTANYL CITRATE (PF) 100 MCG/2ML IJ SOLN
INTRAMUSCULAR | Status: AC
  Filled 2016-03-26: qty 2

## 2016-03-26 MED ORDER — OXYCODONE-ACETAMINOPHEN 5-325 MG PO TABS
ORAL_TABLET | ORAL | Status: AC
  Filled 2016-03-26: qty 1

## 2016-03-26 MED ORDER — LABETALOL HCL 5 MG/ML IV SOLN
INTRAVENOUS | Status: AC
  Filled 2016-03-26: qty 4

## 2016-03-26 MED ORDER — LIDOCAINE 2% (20 MG/ML) 5 ML SYRINGE
INTRAMUSCULAR | Status: DC | PRN
  Administered 2016-03-26: 70 mg via INTRAVENOUS
  Administered 2016-03-26: 30 mg via INTRAVENOUS

## 2016-03-26 MED ORDER — PROPOFOL 10 MG/ML IV BOLUS
INTRAVENOUS | Status: DC | PRN
  Administered 2016-03-26: 50 mg via INTRAVENOUS
  Administered 2016-03-26: 150 mg via INTRAVENOUS
  Administered 2016-03-26: 50 mg via INTRAVENOUS

## 2016-03-26 MED ORDER — LACTATED RINGERS IV SOLN
INTRAVENOUS | Status: DC
  Administered 2016-03-26 (×2): via INTRAVENOUS

## 2016-03-26 MED ORDER — FENTANYL CITRATE (PF) 100 MCG/2ML IJ SOLN
25.0000 ug | INTRAMUSCULAR | Status: DC | PRN
  Administered 2016-03-26 (×2): 25 ug via INTRAVENOUS

## 2016-03-26 MED ORDER — VANCOMYCIN HCL IN DEXTROSE 1-5 GM/200ML-% IV SOLN
1000.0000 mg | INTRAVENOUS | Status: AC
  Administered 2016-03-26: 1000 mg via INTRAVENOUS

## 2016-03-26 SURGICAL SUPPLY — 52 items
BANDAGE ACE 3X5.8 VEL STRL LF (GAUZE/BANDAGES/DRESSINGS) ×3 IMPLANT
BLADE MINI RND TIP GREEN BEAV (BLADE) ×3 IMPLANT
BLADE SURG 15 STRL LF DISP TIS (BLADE) ×2 IMPLANT
BLADE SURG 15 STRL SS (BLADE) ×4
BNDG ELASTIC 2X5.8 VLCR STR LF (GAUZE/BANDAGES/DRESSINGS) IMPLANT
BNDG ESMARK 4X9 LF (GAUZE/BANDAGES/DRESSINGS) ×3 IMPLANT
BNDG GAUZE ELAST 4 BULKY (GAUZE/BANDAGES/DRESSINGS) ×3 IMPLANT
CHLORAPREP W/TINT 26ML (MISCELLANEOUS) ×3 IMPLANT
CORDS BIPOLAR (ELECTRODE) ×3 IMPLANT
COVER BACK TABLE 60X90IN (DRAPES) ×3 IMPLANT
COVER MAYO STAND STRL (DRAPES) ×3 IMPLANT
CUFF TOURNIQUET SINGLE 18IN (TOURNIQUET CUFF) ×3 IMPLANT
DRAPE EXTREMITY T 121X128X90 (DRAPE) ×3 IMPLANT
DRAPE OEC MINIVIEW 54X84 (DRAPES) ×3 IMPLANT
DRAPE SURG 17X23 STRL (DRAPES) ×3 IMPLANT
GAUZE SPONGE 4X4 12PLY STRL (GAUZE/BANDAGES/DRESSINGS) ×3 IMPLANT
GAUZE XEROFORM 1X8 LF (GAUZE/BANDAGES/DRESSINGS) ×3 IMPLANT
GLOVE BIO SURGEON STRL SZ7.5 (GLOVE) ×3 IMPLANT
GLOVE BIOGEL PI IND STRL 8 (GLOVE) ×1 IMPLANT
GLOVE BIOGEL PI INDICATOR 8 (GLOVE) ×2
GOWN STRL REUS W/ TWL LRG LVL3 (GOWN DISPOSABLE) ×1 IMPLANT
GOWN STRL REUS W/ TWL XL LVL3 (GOWN DISPOSABLE) ×1 IMPLANT
GOWN STRL REUS W/TWL LRG LVL3 (GOWN DISPOSABLE) ×2
GOWN STRL REUS W/TWL XL LVL3 (GOWN DISPOSABLE) ×2
K-WIRE .045 CH (WIRE) ×9
KWIRE .045 CH (WIRE) ×3 IMPLANT
NEEDLE HYPO 22GX1.5 SAFETY (NEEDLE) ×3 IMPLANT
NEEDLE HYPO 25X1 1.5 SAFETY (NEEDLE) IMPLANT
NS IRRIG 1000ML POUR BTL (IV SOLUTION) ×3 IMPLANT
PACK BASIN DAY SURGERY FS (CUSTOM PROCEDURE TRAY) ×3 IMPLANT
PAD CAST 3X4 CTTN HI CHSV (CAST SUPPLIES) ×1 IMPLANT
PAD CAST 4YDX4 CTTN HI CHSV (CAST SUPPLIES) IMPLANT
PADDING CAST ABS 4INX4YD NS (CAST SUPPLIES) ×2
PADDING CAST ABS COTTON 4X4 ST (CAST SUPPLIES) ×1 IMPLANT
PADDING CAST COTTON 3X4 STRL (CAST SUPPLIES) ×2
PADDING CAST COTTON 4X4 STRL (CAST SUPPLIES)
SLEEVE SCD COMPRESS KNEE MED (MISCELLANEOUS) ×3 IMPLANT
SPLINT PLASTER CAST XFAST 3X15 (CAST SUPPLIES) IMPLANT
SPLINT PLASTER CAST XFAST 4X15 (CAST SUPPLIES) IMPLANT
SPLINT PLASTER XTRA FAST SET 4 (CAST SUPPLIES)
SPLINT PLASTER XTRA FASTSET 3X (CAST SUPPLIES)
STOCKINETTE 4X48 STRL (DRAPES) ×3 IMPLANT
SUT ETHILON 3 0 PS 1 (SUTURE) IMPLANT
SUT ETHILON 4 0 PS 2 18 (SUTURE) ×3 IMPLANT
SUT MERSILENE 4 0 P 3 (SUTURE) IMPLANT
SUT VIC AB 3-0 PS1 18 (SUTURE)
SUT VIC AB 3-0 PS1 18XBRD (SUTURE) IMPLANT
SUT VICRYL 4-0 PS2 18IN ABS (SUTURE) IMPLANT
SYR BULB 3OZ (MISCELLANEOUS) ×3 IMPLANT
SYR CONTROL 10ML LL (SYRINGE) ×3 IMPLANT
TOWEL OR 17X24 6PK STRL BLUE (TOWEL DISPOSABLE) ×6 IMPLANT
UNDERPAD 30X30 (UNDERPADS AND DIAPERS) ×3 IMPLANT

## 2016-03-26 NOTE — Anesthesia Procedure Notes (Signed)
Procedure Name: LMA Insertion Date/Time: 03/26/2016 8:57 AM Performed by: Lyndee Leo Pre-anesthesia Checklist: Patient identified, Emergency Drugs available, Suction available and Patient being monitored Patient Re-evaluated:Patient Re-evaluated prior to inductionOxygen Delivery Method: Circle system utilized Preoxygenation: Pre-oxygenation with 100% oxygen Intubation Type: IV induction Ventilation: Mask ventilation without difficulty LMA: LMA inserted LMA Size: 4.0 Number of attempts: 1 Airway Equipment and Method: Bite block Placement Confirmation: positive ETCO2 Tube secured with: Tape Dental Injury: Teeth and Oropharynx as per pre-operative assessment

## 2016-03-26 NOTE — Op Note (Signed)
[id number] 

## 2016-03-26 NOTE — H&P (Signed)
Deborah Murray is an 55 y.o. female.   Chief Complaint: left small metacarpal fracture HPI: 55 yo rhd female states she fell getting out of car several days ago injuring left hand.  Seen at ED where XR revealed small finger metacarpal fracture.  Splinted and followed up.  She wishes to have operative fixation.  Allergies:  Allergies  Allergen Reactions  . Aspirin Shortness Of Breath and Palpitations  . Codeine Sulfate Shortness Of Breath and Palpitations  . Darvocet [Propoxyphene N-Acetaminophen] Shortness Of Breath and Palpitations  . Tramadol Shortness Of Breath  . Penicillins Other (See Comments)    Has patient had a PCN reaction causing immediate rash, facial/tongue/throat swelling, SOB or lightheadedness with hypotension: No Has patient had a PCN reaction causing severe rash involving mucus membranes or skin necrosis: No Has patient had a PCN reaction that required hospitalization No Has patient had a PCN reaction occurring within the last 10 years: Yes If all of the above answers are "NO", then may proceed with Cephalosporin use.     Past Medical History:  Diagnosis Date  . Adrenal abnormality ([place])   . Adrenal insufficiency ([place]) 2013   Tx at [facility]  . Asthma   . Atrial fibrillation ([place])   . Chronic back pain   . COPD (chronic obstructive pulmonary disease) ([place])   . Glaucoma   . Headache   . Hyperlipidemia   . Hypertension   . Lumbar radiculopathy   . Sepsis ([place]) 11/04/2015  . Syncope and collapse 05/11/11  adrenal insuffiency  treated [facility]  . Thyroid disease   . Vocal cord dysfunction     Past Surgical History:  Procedure Laterality Date  . ABDOMINAL HYSTERECTOMY    . BACK SURGERY     disc   . CARDIAC CATHETERIZATION  2012 at [facility]  . CHOLECYSTECTOMY    . CHOLECYSTECTOMY, LAPAROSCOPIC    . ESOPHAGOGASTRODUODENOSCOPY N/A 09/17/2015   Procedure: ESOPHAGOGASTRODUODENOSCOPY (EGD);  Surgeon: Daneil Dolin, MD;  Location: [facility];  Service: Endoscopy;   Laterality: [id number]  . eye surg for glaucoma    . gastic by-pass  05-11-11   Gastric by-pass in 2008  . right knee surg      Family History: Family History  Problem Relation Age of Onset  . Heart attack Mother   . Hypertension Mother   . Heart attack Father   . Asthma Father   . Hyperlipidemia Father   . Hypertension Father   . Heart attack Sister   . Arrhythmia Sister   . Asthma Sister   . Hyperlipidemia Sister   . Hypertension Sister   . Asthma Brother   . Hypertension Brother     Social History:   reports that she has never smoked. She has never used smokeless tobacco. She reports that she does not drink alcohol or use drugs.  Medications: Medications Prior to Admission  Medication Sig Dispense Refill  . albuterol (PROVENTIL HFA;VENTOLIN HFA) 108 (90 BASE) MCG/ACT inhaler Inhale 2 puffs into the lungs every 4 (four) hours as needed for wheezing.     Marland Kitchen atorvastatin (LIPITOR) 20 MG tablet Take 20 mg by mouth every evening.     . brimonidine (ALPHAGAN) 0.2 % ophthalmic solution Place 1 drop into both eyes 2 (two) times daily.    . Calcium Carb-Ergocalciferol 500-200 MG-UNIT TABS Take 1 tablet by mouth daily.    . Cholecalciferol (VITAMIN D) 2000 UNITS tablet Take 2,000 Units by mouth daily.    Marland Kitchen  citalopram (CELEXA) 20 MG tablet Take 20 mg by mouth at bedtime.     . clindamycin (CLEOCIN T) 1 % external solution Apply 1 application topically every evening. Applies to underarms    . cyclobenzaprine (FLEXERIL) 10 MG tablet Take 10 mg by mouth every 8 (eight) hours as needed.    . fluticasone (FLOVENT HFA) 220 MCG/ACT inhaler Inhale 2 puffs into the lungs every 4 (four) hours.    . furosemide (LASIX) 40 MG tablet Take 40 mg by mouth daily.    Marland Kitchen gabapentin (NEURONTIN) 600 MG tablet Take 600 mg by mouth 3 (three) times daily.    Marland Kitchen ipratropium (ATROVENT) 0.02 % nebulizer solution Take 2.5 mLs by nebulization 4 (four) times daily as needed for wheezing or shortness of breath.      . latanoprost (XALATAN) 0.005 % ophthalmic solution Place 1 drop into both eyes at bedtime.    Marland Kitchen lisinopril (PRINIVIL,ZESTRIL) 10 MG tablet Take 10 mg by mouth daily.    Marland Kitchen loratadine (CLARITIN) 10 MG tablet Take 10 mg by mouth daily.  0  . Melatonin 3 MG TABS Take 3 mg by mouth at bedtime.    . montelukast (SINGULAIR) 10 MG tablet Take 10 mg by mouth at bedtime.    . Multiple Vitamin (MULTIVITAMIN) capsule Take 1 capsule by mouth daily.    Marland Kitchen omalizumab (XOLAIR) 150 MG injection Inject 300 mg into the skin every 30 (thirty) days.    Marland Kitchen oxyCODONE-acetaminophen (PERCOCET/ROXICET) 5-325 MG tablet Take 1 tablet by mouth every 6 (six) hours as needed for severe pain. 11 tablet 0  . potassium chloride (MICRO-K) 10 MEQ CR capsule Take 10 mEq by mouth daily.    . predniSONE (DELTASONE) 5 MG tablet Take 15 mg by mouth daily.    . promethazine (PHENERGAN) 25 MG tablet Take 25 mg by mouth every 4 (four) hours as needed for nausea or vomiting.     . rivaroxaban (XARELTO) 20 MG TABS tablet Take 1 tablet (20 mg total) by mouth daily with supper. 30 tablet 0  . spironolactone (ALDACTONE) 50 MG tablet Take 1 tablet (50 mg total) by mouth daily.    Marland Kitchen tiotropium (SPIRIVA HANDIHALER) 18 MCG inhalation capsule Place 1 capsule into inhaler and inhale daily.    . Amino Acids-Protein Hydrolys (FEEDING SUPPLEMENT, PRO-STAT SUGAR FREE 64,) LIQD Take 30 mLs by mouth 2 (two) times daily. 900 mL 0  . EPINEPHrine (EPIPEN JR) 0.15 MG/0.3ML injection Inject 0.15 mg into the muscle as needed.    . nitroGLYCERIN (NITROSTAT) 0.4 MG SL tablet Place 0.4 mg under the tongue every 5 (five) minutes as needed for chest pain.      No results found for this or any previous visit (from the past 48 hour(s)).  No results found.   A comprehensive review of systems was negative.  Blood pressure 116/79, pulse 61, temperature 98.3 F (36.8 C), temperature source Oral, resp. rate 20, height 5\' 2"  (1.575 m), weight 108 kg (238 lb), SpO2 99  %.  General appearance: alert, cooperative and appears stated age Head: Normocephalic, without obvious abnormality, atraumatic Neck: supple, symmetrical, trachea midline Resp: clear to auscultation bilaterally Cardio: regular rate and rhythm GI: normal bowel sounds Extremities: Intact sensation and capillary refill all digits.  +epl/fpl/io.  No wounds.  Pulses: 2+ and symmetric Skin: Skin color, texture, turgor normal. No rashes or lesions Neurologic: Grossly normal Incision/Wound:none  Assessment/Plan Left small finger metacarpal fracture.  Plan operative fixation.  Non operative and operative treatment  options were discussed with the patient and patient wishes to proceed with operative treatment. Risks, benefits, and alternatives of surgery were discussed and the patient agrees with the plan of care.   Psalm Schappell R 03/26/2016, 8:44 AM

## 2016-03-26 NOTE — Anesthesia Postprocedure Evaluation (Signed)
Anesthesia Post Note  Patient: Deborah Murray  Procedure(s) Performed: Procedure(s) (LRB): CLOSED REDUCTION METACARPAL WITH PERCUTANEOUS PINNING (Left)  Patient location during evaluation: PACU Anesthesia Type: General Level of consciousness: awake and alert Pain management: pain level controlled Vital Signs Assessment: post-procedure vital signs reviewed and stable Respiratory status: spontaneous breathing, nonlabored ventilation, respiratory function stable and patient connected to nasal cannula oxygen Cardiovascular status: blood pressure returned to baseline and stable Postop Assessment: no signs of nausea or vomiting Anesthetic complications: no    Last Vitals:  Vitals:   03/26/16 1140 03/26/16 1215  BP: (!) 142/87 123/77  Pulse: 70 77  Resp: 16 16  Temp: 37.1 C 36.9 C    Last Pain:  Vitals:   03/26/16 1215  TempSrc: Oral  PainSc: 5                  Catalina Gravel

## 2016-03-26 NOTE — Brief Op Note (Signed)
03/26/2016  9:35 AM  PATIENT:  Deborah Murray  55 y.o. female  PRE-OPERATIVE DIAGNOSIS:  LEFT SMALL METACARPAL BASE FRACTURE  POST-OPERATIVE DIAGNOSIS:  LEFT SMALL METACARPAL BASE FRACTURE  PROCEDURE:  Procedure(s): CLOSED REDUCTION METACARPAL WITH PERCUTANEOUS PINNING (Left)  SURGEON:  Surgeon(s) and Role:    * Leanora Cover, MD - Primary  PHYSICIAN ASSISTANT:   ASSISTANTS: none   ANESTHESIA:   general  EBL:  No intake/output data recorded.  BLOOD ADMINISTERED:none  DRAINS: none   LOCAL MEDICATIONS USED:  MARCAINE     SPECIMEN:  No Specimen  DISPOSITION OF SPECIMEN:  N/A  COUNTS:  YES  TOURNIQUET:   Total Tourniquet Time Documented: Upper Arm (Left) - 20 minutes Total: Upper Arm (Left) - 20 minutes   DICTATION: .Other Dictation: Dictation Number [id number]  PLAN OF CARE: Discharge to home after PACU  PATIENT DISPOSITION:  PACU - hemodynamically stable.

## 2016-03-26 NOTE — Anesthesia Preprocedure Evaluation (Addendum)
Anesthesia Evaluation  Patient identified by MRN, date of birth, ID band Patient awake  General Assessment Comment:past medical history of asthma, COPD, adrenal insufficiency, atrial fibrillation, chronic back pain  Reviewed: Allergy & Precautions, NPO status , Patient's Chart, lab work & pertinent test results  Airway Mallampati: II  TM Distance: >3 FB Neck ROM: Full    Dental  (+) Teeth Intact, Dental Advisory Given   Pulmonary asthma , sleep apnea and Continuous Positive Airway Pressure Ventilation , COPD,  COPD inhaler,    Pulmonary exam normal breath sounds clear to auscultation       Cardiovascular hypertension, Pt. on medications (-) angina(-) CAD, (-) Past MI and (-) CHF + dysrhythmias Atrial Fibrillation  Rhythm:Irregular Rate:Normal     Neuro/Psych  Headaches,  Neuromuscular disease    GI/Hepatic Neg liver ROS, PUD, GERD  Medicated,S/p gastric bypass   Endo/Other  Hypothyroidism Morbid obesityAdrenal insufficiency  Renal/GU negative Renal ROS     Musculoskeletal negative musculoskeletal ROS (+)   Abdominal   Peds  Hematology  (+) Blood dyscrasia (Xarelto), anemia ,   Anesthesia Other Findings Day of surgery medications reviewed with the patient.  Reproductive/Obstetrics                           Anesthesia Physical Anesthesia Plan  ASA: III  Anesthesia Plan: General   Post-op Pain Management:    Induction: Intravenous  Airway Management Planned: LMA  Additional Equipment:   Intra-op Plan:   Post-operative Plan: Extubation in OR  Informed Consent: I have reviewed the patients History and Physical, chart, labs and discussed the procedure including the risks, benefits and alternatives for the proposed anesthesia with the patient or authorized representative who has indicated his/her understanding and acceptance.   Dental advisory given  Plan Discussed with:  CRNA  Anesthesia Plan Comments: (Risks/benefits of general anesthesia discussed with patient including risk of damage to teeth, lips, gum, and tongue, nausea/vomiting, allergic reactions to medications, and the possibility of heart attack, stroke and death.  All patient questions answered.  Patient wishes to proceed.)        Anesthesia Quick Evaluation

## 2016-03-26 NOTE — Progress Notes (Signed)
Deborah Murray states she no known problems with oxycodone for pain.

## 2016-03-26 NOTE — Transfer of Care (Signed)
Immediate Anesthesia Transfer of Care Note  Patient: Deborah Murray  Procedure(s) Performed: Procedure(s): CLOSED REDUCTION METACARPAL WITH PERCUTANEOUS PINNING (Left)  Patient Location: PACU  Anesthesia Type:General  Level of Consciousness: awake, oriented and patient cooperative  Airway & Oxygen Therapy: Patient Spontanous Breathing and Patient connected to face mask oxygen  Post-op Assessment: Report given to RN and Post -op Vital signs reviewed and stable  Post vital signs: Reviewed and stable  Last Vitals:  Vitals:   03/26/16 0815  BP: 116/79  Pulse: 61  Resp: 20  Temp: 36.8 C    Last Pain:  Vitals:   03/26/16 0815  TempSrc: Oral         Complications: No apparent anesthesia complications

## 2016-03-26 NOTE — Discharge Instructions (Addendum)
[facility] Instructions °Hand Surgery ° °Wound Care: °Keep your hand elevated above the level of your heart.  Do not allow it to dangle by your side.  Keep the dressing dry and do not remove it unless your doctor advises you to do so.  He will usually change it at the time of your post-op visit.  Moving your fingers is advised to stimulate circulation but will depend on the site of your surgery.  If you have a splint applied, your doctor will advise you regarding movement. ° °Activity: °Do not drive or operate machinery today.  Rest today and then you may return to your normal activity and work as indicated by your physician. ° °Diet:  °Drink liquids today or eat a light diet.  You may resume a regular diet tomorrow.   ° °General expectations: °Pain for two to three days. °Fingers may become slightly swollen. ° °Call your doctor if any of the following occur: °Severe pain not relieved by pain medication. °Elevated temperature. °Dressing soaked with blood. °Inability to move fingers. °White or bluish color to fingers. ° ° ° °Post Anesthesia Home Care Instructions ° °Activity: °Get plenty of rest for the remainder of the day. A responsible adult should stay with you for 24 hours following the procedure.  °For the next 24 hours, DO NOT: °-Drive a car °-Operate machinery °-Drink alcoholic beverages °-Take any medication unless instructed by your physician °-Make any legal decisions or sign important papers. ° °Meals: °Start with liquid foods such as gelatin or soup. Progress to regular foods as tolerated. Avoid greasy, spicy, heavy foods. If nausea and/or vomiting occur, drink only clear liquids until the nausea and/or vomiting subsides. Call your physician if vomiting continues. ° °Special Instructions/Symptoms: °Your throat may feel dry or sore from the anesthesia or the breathing tube placed in your throat during surgery. If this causes discomfort, gargle with warm salt water. The discomfort should disappear within  24 hours. ° °If you had a scopolamine patch placed behind your ear for the management of post- operative nausea and/or vomiting: ° °1. The medication in the patch is effective for 72 hours, after which it should be removed.  Wrap patch in a tissue and discard in the trash. Wash hands thoroughly with soap and water. °2. You may remove the patch earlier than 72 hours if you experience unpleasant side effects which may include dry mouth, dizziness or visual disturbances. °3. Avoid touching the patch. Wash your hands with soap and water after contact with the patch. °  °Call your surgeon if you experience:  ° °1.  Fever over 101.0. °2.  Inability to urinate. °3.  Nausea and/or vomiting. °4.  Extreme swelling or bruising at the surgical site. °5.  Continued bleeding from the incision. °6.  Increased pain, redness or drainage from the incision. °7.  Problems related to your pain medication. °8.  Any problems and/or concerns °

## 2016-03-27 ENCOUNTER — Encounter (HOSPITAL_BASED_OUTPATIENT_CLINIC_OR_DEPARTMENT_OTHER): Payer: Self-pay | Admitting: Orthopedic Surgery

## 2016-03-27 NOTE — Op Note (Signed)
NAMELINDAMARIE, SNEDDON             ACCOUNT NO.:  [id number]  MEDICAL RECORD NO.:  [id number]  LOCATION:                                 FACILITY:  PHYSICIAN:  Leanora Cover, MD        DATE OF BIRTH:  12/29/1960  DATE OF PROCEDURE:  03/26/2016 DATE OF DISCHARGE:                              OPERATIVE REPORT   PREOPERATIVE DIAGNOSIS:  Left small finger metacarpal base fracture.  POSTOPERATIVE DIAGNOSIS:  Left small finger metacarpal base fracture.  PROCEDURE:  Closed reduction and percutaneous pinning, left small finger metacarpal base fracture.  SURGEON:  Leanora Cover, MD.  ASSISTANT:  None.  ANESTHESIA:  General.  IV FLUIDS:  Per anesthesia flow sheet.  ESTIMATED BLOOD LOSS:  Minimal.  COMPLICATIONS:  None.  SPECIMENS:  None.  TOURNIQUET TIME:  20 minutes.  DISPOSITION:  Stable to PACU.  INDICATIONS:  Ms. Raikes is a 55 year old female, who fell while getting into her car several days ago.  She was seen at the emergency department, where radiographs were taken revealing a small finger metacarpal base fracture.  She was splinted and followed up in the office.  She wished to undergo operative fixation.  Risks, benefits, and alternative of surgery were discussed including risk of blood loss; infection; damage to nerves, vessels, tendons, ligaments, bone; failure of surgery; need for additional surgery; complications with wound healing; malunion; nonunion; stiffness.  She voiced understanding these risks and elected to proceed.  OPERATIVE COURSE:  After being identified preoperatively by myself, the patient and I agreed upon procedure and site of procedure.  Surgical site marked.  The risks, benefits, and alternatives of surgery were reviewed and she wished to proceed.  Surgical consent had been signed. She was given IV vancomycin as preoperative antibiotic prophylaxis.  She was transferred to the operating room and placed on the operating room table in supine position  with left upper extremity on arm board. General anesthesia induced by Anesthesiology.  Left upper extremity was prepped and draped in normal sterile orthopedic fashion.  Surgical pause was performed between surgeons, anesthesia, and operating staff; and all were in agreement as to the patient, procedure, and site of procedure. Tourniquet at the proximal aspect of the extremity was inflated to 250 mmHg after exsanguination of the limb with an Esmarch bandage.  Close reduction of the metacarpal was performed.  C-arm was used in AP and lateral projections to ensure appropriate reduction.  A 0.045-inch K- wires were then used.  One was advanced in an oblique fashion across the fracture site into the carpus.  Two were advance distal to the fracture from the small finger metacarpal into the ring finger metacarpal.  This was adequate to stabilize the fracture.  The wrist was placed through tenodesis and there was no scissoring.  The pins were bent and cut short.  The pin sites were dressed with sterile Xeroform.  The area was injected with 15 mL of 0.25% plain Marcaine to aid in postoperative analgesia.  The pin sites were then dressed with sterile 4x4s and wrapped with a Kerlix bandage.  A volar and dorsal slab splint including the long ring and small fingers with the MPs flexed and  IPs extended was placed and wrapped with Kerlix and Ace bandage.  Tourniquet deflated at 20 minutes.  Fingertips were pink with brisk capillary refill after deflation of tourniquet.  Operative drapes were broken down.  The patient was awakened from anesthesia safely.  She was transferred back to stretcher and taken to PACU in stable condition.  I will see her back in the office in 1 week for postoperative followup.  I will give her a prescription for Percocet 5/325 one to two p.o. q.6 hours p.r.n. pain, dispensed #30.     Leanora Cover, MD     KK/MEDQ  D:  03/26/2016  T:  03/27/2016  Job:  [id number]

## 2016-08-15 ENCOUNTER — Emergency Department (HOSPITAL_COMMUNITY)
Admission: EM | Admit: 2016-08-15 | Discharge: 2016-08-15 | Disposition: A | Discharge status: Home / Self Care | Payer: Medicare Other | Attending: Emergency Medicine | Admitting: Emergency Medicine

## 2016-08-15 ENCOUNTER — Emergency Department (HOSPITAL_COMMUNITY): Payer: Medicare Other

## 2016-08-15 ENCOUNTER — Encounter (HOSPITAL_COMMUNITY): Payer: Self-pay

## 2016-08-15 DIAGNOSIS — R42 Dizziness and giddiness: Secondary | ICD-10-CM

## 2016-08-15 DIAGNOSIS — H8110 Benign paroxysmal vertigo, unspecified ear: Secondary | ICD-10-CM | POA: Diagnosis not present

## 2016-08-15 DIAGNOSIS — J449 Chronic obstructive pulmonary disease, unspecified: Secondary | ICD-10-CM | POA: Insufficient documentation

## 2016-08-15 DIAGNOSIS — I10 Essential (primary) hypertension: Secondary | ICD-10-CM | POA: Insufficient documentation

## 2016-08-15 DIAGNOSIS — H8113 Benign paroxysmal vertigo, bilateral: Secondary | ICD-10-CM

## 2016-08-15 DIAGNOSIS — Z79899 Other long term (current) drug therapy: Secondary | ICD-10-CM | POA: Insufficient documentation

## 2016-08-15 DIAGNOSIS — H8193 Unspecified disorder of vestibular function, bilateral: Secondary | ICD-10-CM | POA: Insufficient documentation

## 2016-08-15 LAB — COMPREHENSIVE METABOLIC PANEL
ALT: 20 U/L (ref 14–54)
AST: 24 U/L (ref 15–41)
Albumin: 3.4 g/dL — ABNORMAL LOW (ref 3.5–5.0)
Alkaline Phosphatase: 73 U/L (ref 38–126)
Anion gap: 6 (ref 5–15)
BUN: 18 mg/dL (ref 6–20)
CO2: 25 mmol/L (ref 22–32)
Calcium: 8.7 mg/dL — ABNORMAL LOW (ref 8.9–10.3)
Chloride: 111 mmol/L (ref 101–111)
Creatinine, Ser: 1.04 mg/dL — ABNORMAL HIGH (ref 0.44–1.00)
GFR calc Af Amer: >60 mL/min (ref >60)
GFR calc non Af Amer: 58 mL/min — ABNORMAL LOW (ref >60)
Glucose, Bld: 87 mg/dL (ref 65–99)
Potassium: 3.5 mmol/L (ref 3.5–5.1)
Sodium: 142 mmol/L (ref 135–145)
Total Bilirubin: 0.3 mg/dL (ref 0.3–1.2)
Total Protein: 5.8 g/dL — ABNORMAL LOW (ref 6.5–8.1)

## 2016-08-15 LAB — URINALYSIS, ROUTINE W REFLEX MICROSCOPIC
Bacteria, UA: NONE SEEN
Bilirubin Urine: NEGATIVE
Glucose, UA: NEGATIVE mg/dL
Hgb urine dipstick: NEGATIVE
Ketones, ur: NEGATIVE mg/dL
Nitrite: NEGATIVE
Protein, ur: NEGATIVE mg/dL
Specific Gravity, Urine: 1.024 (ref 1.005–1.030)
pH: 5 (ref 5.0–8.0)

## 2016-08-15 LAB — CBC WITH DIFFERENTIAL/PLATELET
Basophils Absolute: 0.1 10*3/uL (ref 0.0–0.1)
Basophils Relative: 1 %
Eosinophils Absolute: 0 10*3/uL (ref 0.0–0.7)
Eosinophils Relative: 1 %
HCT: 35.6 % — ABNORMAL LOW (ref 36.0–46.0)
Hemoglobin: 11.2 g/dL — ABNORMAL LOW (ref 12.0–15.0)
Lymphocytes Relative: 25 %
Lymphs Abs: 1.2 10*3/uL (ref 0.7–4.0)
MCH: 28 pg (ref 26.0–34.0)
MCHC: 31.5 g/dL (ref 30.0–36.0)
MCV: 89 fL (ref 78.0–100.0)
Monocytes Absolute: 0.2 10*3/uL (ref 0.1–1.0)
Monocytes Relative: 5 %
Neutro Abs: 3.2 10*3/uL (ref 1.7–7.7)
Neutrophils Relative %: 68 %
Platelets: 270 10*3/uL (ref 150–400)
RBC: 4 MIL/uL (ref 3.87–5.11)
RDW: 15.6 % — ABNORMAL HIGH (ref 11.5–15.5)
WBC: 4.7 10*3/uL (ref 4.0–10.5)

## 2016-08-15 MED ORDER — MECLIZINE HCL 25 MG PO TABS
25.0000 mg | ORAL_TABLET | Freq: Once | ORAL | Status: AC
  Administered 2016-08-15: 25 mg via ORAL
  Filled 2016-08-15: qty 1

## 2016-08-15 MED ORDER — SODIUM CHLORIDE 0.9 % IV BOLUS (SEPSIS)
500.0000 mL | Freq: Once | INTRAVENOUS | Status: AC
  Administered 2016-08-15: 500 mL via INTRAVENOUS

## 2016-08-15 MED ORDER — ONDANSETRON HCL 4 MG/2ML IJ SOLN
4.0000 mg | Freq: Once | INTRAMUSCULAR | Status: AC
  Administered 2016-08-15: 4 mg via INTRAVENOUS
  Filled 2016-08-15: qty 2

## 2016-08-15 MED ORDER — PROCHLORPERAZINE EDISYLATE 5 MG/ML IJ SOLN
10.0000 mg | Freq: Once | INTRAMUSCULAR | Status: DC
  Filled 2016-08-15: qty 2

## 2016-08-15 MED ORDER — MECLIZINE HCL 25 MG PO TABS: 25.0000 mg | ORAL_TABLET | Freq: Three times a day (TID) | ORAL | 0 refills | Status: DC | PRN

## 2016-08-15 NOTE — ED Provider Notes (Addendum)
[facility] DEPT Provider Note   CSN: [id number] Arrival date & time: 08/15/16  1101     History   Chief Complaint Chief Complaint  Patient presents with  . Dizziness    HPI Deborah Murray is a 56 y.o. female.  HPI Patient presents to the emergency department with dizziness over the last 2 weeks.  Patient, states she has also had a headache for the last week.  Patient states that her dizziness seems to only occur when she stands or changes positions.  She states that nothing seems make the condition better.  She states she did not take any medications prior to arrival.  She does have a prescription for meclizine, but did not take any of the medication. The patient denies chest pain, shortness of breath, blurred vision, neck pain, fever, cough, weakness, numbness,  anorexia, edema, abdominal pain,  vomiting, diarrhea, rash, back pain, dysuria, hematemesis, bloody stool, near syncope, or syncope. Past Medical History:  Diagnosis Date  . Adrenal abnormality ([place])   . Adrenal insufficiency ([place]) 2013   Tx at [facility]  . Asthma   . Atrial fibrillation ([place])   . Chronic back pain   . COPD (chronic obstructive pulmonary disease) ([place])   . Glaucoma   . Headache   . Hyperlipidemia   . Hypertension   . Lumbar radiculopathy   . Sepsis ([place]) 11/04/2015  . Syncope and collapse 05/11/11  adrenal insuffiency  treated at [facility]  . Thyroid disease   . Vocal cord dysfunction     Patient Active Problem List   Diagnosis Date Noted  . Anasarca 11/18/2015  . Generalized abdominal pain   . Protein-calorie malnutrition, severe 11/05/2015  . Sepsis ([place]) 11/04/2015  . AKI (acute kidney injury) ([place])   . Pyrexia   . Acute kidney injury ([place]) 11/02/2015  . Hyponatremia 11/02/2015  . Metabolic acidosis 46/27/0350  . Hypocalcemia 11/01/2015  . C. difficile colitis 10/31/2015  . Pancolitis ([place]) 10/30/2015  . C. difficile diarrhea 10/30/2015  . Abdominal pain in female 10/30/2015  .  Hypotension 10/30/2015  . History of atrial fibrillation- NSR now 10/30/2015  . History of adrenal insufficiency 10/30/2015  . History of hypertension 10/30/2015  . Esophageal reflux   . Nausea   . GERD (gastroesophageal reflux disease) 08/28/2015  . Nausea without vomiting 08/28/2015  . Abdominal pain, epigastric 08/28/2015  . Dark stools 08/28/2015  . Atrial fibrillation with RVR ([place]) 05/12/2014  . Hypokalemia 05/12/2014  . Essential hypertension 05/12/2014  . Severe persistent asthma 05/12/2014  . OSA on CPAP 05/12/2014  . Toxic nodular goiter 05/12/2014  . Morbid obesity ([place]) 05/12/2014  . Pain in joint, shoulder region 08/02/2013  . Muscle weakness (generalized) 08/02/2013  . Decreased range of motion of left shoulder 08/02/2013  . Tight fascia 08/02/2013    Past Surgical History:  Procedure Laterality Date  . ABDOMINAL HYSTERECTOMY    . BACK SURGERY     disc   . CARDIAC CATHETERIZATION  2012 at [facility]  . CHOLECYSTECTOMY    . CHOLECYSTECTOMY, LAPAROSCOPIC    . CLOSED REDUCTION METACARPAL WITH PERCUTANEOUS PINNING Left 03/26/2016   Procedure: CLOSED REDUCTION METACARPAL WITH PERCUTANEOUS PINNING;  Surgeon: Leanora Cover, MD;  Location: [facility];  Service: Orthopedics;  Laterality: Left;  . ESOPHAGOGASTRODUODENOSCOPY N/A 09/17/2015   Procedure: ESOPHAGOGASTRODUODENOSCOPY (EGD);  Surgeon: Daneil Dolin, MD;  [facility];  Service: Endoscopy;  Laterality: N/A;  0938  . eye surg for glaucoma    .  gastic by-pass  05-11-11   Gastric by-pass in 2008  . right knee surg      OB History    No data available       Home Medications    Prior to Admission medications   Medication Sig Start Date End Date Taking? Authorizing Provider  albuterol (PROVENTIL HFA;VENTOLIN HFA) 108 (90 BASE) MCG/ACT inhaler Inhale 2 puffs into the lungs every 4 (four) hours as needed for wheezing.    Yes [provider]  aluminum chloride (DRYSOL) 20 % external  solution Apply 1 application topically daily. 05/13/09  Yes [provider]  atorvastatin (LIPITOR) 20 MG tablet Take 20 mg by mouth every evening.    Yes [provider]  brimonidine (ALPHAGAN) 0.2 % ophthalmic solution Place 1 drop into both eyes 2 (two) times daily. 09/05/15  Yes [provider]  Calcium Carb-Ergocalciferol 500-200 MG-UNIT TABS Take 1 tablet by mouth daily.   Yes [provider]  Cholecalciferol (VITAMIN D) 2000 UNITS tablet Take 2,000 Units by mouth daily.   Yes [provider]  citalopram (CELEXA) 20 MG tablet Take 20 mg by mouth at bedtime.    Yes [provider]  clindamycin (CLEOCIN T) 1 % external solution Apply 1 application topically every evening. Applies to underarms 11/08/09  Yes [provider]  DULoxetine (CYMBALTA) 20 MG capsule Take 40 mg by mouth daily. 07/20/16  Yes [provider]  fluticasone (FLOVENT HFA) 220 MCG/ACT inhaler Inhale 2 puffs into the lungs every 4 (four) hours. 06/24/09  Yes [provider]  gabapentin (NEURONTIN) 600 MG tablet Take 600 mg by mouth 3 (three) times daily. 12/09/15  Yes [provider]  hydrochlorothiazide (HYDRODIURIL) 25 MG tablet Take 25 mg by mouth daily. 08/11/16  Yes [provider]  ipratropium (ATROVENT) 0.02 % nebulizer solution Take 2.5 mLs by nebulization 4 (four) times daily as needed for wheezing or shortness of breath.  06/16/15  Yes [provider]  latanoprost (XALATAN) 0.005 % ophthalmic solution Place 1 drop into both eyes at bedtime.   Yes [provider]  levothyroxine (SYNTHROID, LEVOTHROID) 50 MCG tablet Take 50 mcg by mouth daily. 08/07/16  Yes [provider]  lisinopril (PRINIVIL,ZESTRIL) 10 MG tablet Take 10 mg by mouth daily.   Yes [provider]  lisinopril (PRINIVIL,ZESTRIL) 20 MG tablet Take 20 mg by mouth daily. 08/07/16  Yes [provider]  loratadine (CLARITIN) 10 MG  tablet Take 10 mg by mouth daily. 12/09/15  Yes [provider]  Melatonin 3 MG TABS Take 3 mg by mouth at bedtime.   Yes [provider]  montelukast (SINGULAIR) 10 MG tablet Take 10 mg by mouth at bedtime.   Yes [provider]  Multiple Vitamin (MULTIVITAMIN) capsule Take 1 capsule by mouth daily.   Yes [provider]  omalizumab Arvid Right) 150 MG injection Inject 300 mg into the skin every 30 (thirty) days. 06/11/15  Yes [provider]  predniSONE (DELTASONE) 1 MG tablet Take 4 mg by mouth daily. Take 4 tablets of the 1mg  (4mg ) plus 1 tablet of the 5mg  for a total of 9mg  per day 06/18/16  Yes [provider]  predniSONE (DELTASONE) 5 MG tablet Take 5 mg by mouth daily. Take 1 tablet (5mg ) in combination with 4 x1mg  for a total of 9mg  per day 12/09/15  Yes [provider]  promethazine (PHENERGAN) 25 MG tablet Take 25 mg by mouth every 4 (four) hours as needed for  nausea or vomiting.  12/17/15  Yes [provider]  ranitidine (ZANTAC) 150 MG tablet Take 150 mg by mouth 2 (two) times daily. 06/25/16 06/25/17 Yes [provider]  rivaroxaban (XARELTO) 20 MG TABS tablet Take 1 tablet (20 mg total) by mouth daily with supper. 05/13/14  Yes Hongalgi, Lenis Dickinson, MD  tiotropium (SPIRIVA HANDIHALER) 18 MCG inhalation capsule Place 1 capsule into inhaler and inhale daily. 12/09/09  Yes [provider]  VOLTAREN 1 % GEL Apply 1 application topically daily. 06/01/16  Yes [provider]  cyclobenzaprine (FLEXERIL) 10 MG tablet Take 10 mg by mouth every 8 (eight) hours as needed. 10/21/15   [provider]  EPINEPHrine (EPIPEN JR) 0.15 MG/0.3ML injection Inject 0.15 mg into the muscle as needed. 06/11/15   [provider]  nitroGLYCERIN (NITROSTAT) 0.4 MG SL tablet Place 0.4 mg under the tongue every 5 (five) minutes as needed for chest pain.    [provider]    Family History Family History    Problem Relation Age of Onset  . Heart attack Mother   . Hypertension Mother   . Heart attack Father   . Asthma Father   . Hyperlipidemia Father   . Hypertension Father   . Heart attack Sister   . Arrhythmia Sister   . Asthma Sister   . Hyperlipidemia Sister   . Hypertension Sister   . Asthma Brother   . Hypertension Brother     Social History Social History  Substance Use Topics  . Smoking status: Never Smoker  . Smokeless tobacco: Never Used  . Alcohol use No     Allergies   Aspirin; Codeine sulfate; Darvocet [propoxyphene n-acetaminophen]; Nsaids; Tramadol; and Penicillins   Review of Systems Review of Systems All other systems negative except as documented in the HPI. All pertinent positives and negatives as reviewed in the HPI. Physical Exam Updated Vital Signs BP (!) 149/101   Pulse (!) 59   Temp 98.5 F (36.9 C) (Oral)   Resp 16   SpO2 99%   Physical Exam  Constitutional: She is oriented to person, place, and time. She appears well-developed and well-nourished. No distress.  HENT:  Head: Normocephalic and atraumatic.  Mouth/Throat: Oropharynx is clear and moist.  Eyes: Pupils are equal, round, and reactive to light.  Neck: Normal range of motion. Neck supple.  Cardiovascular: Normal rate, regular rhythm and normal heart sounds.  Exam reveals no gallop and no friction rub.   No murmur heard. Pulmonary/Chest: Effort normal and breath sounds normal. No respiratory distress. She has no wheezes.  Neurological: She is alert and oriented to person, place, and time. She has normal strength. She displays normal reflexes. No sensory deficit. She exhibits normal muscle tone. Coordination and gait normal. GCS eye subscore is 4. GCS verbal subscore is 5. GCS motor subscore is 6.  Patient has normal heel to shin and finger to nose testing  Skin: Skin is warm and dry. Capillary refill takes less than 2 seconds. No rash noted. No erythema.  Psychiatric: She has a normal  mood and affect. Her behavior is normal.  Nursing note and vitals reviewed.    ED Treatments / Results  Labs (all labs ordered are listed, but only abnormal results are displayed) Labs Reviewed  COMPREHENSIVE METABOLIC PANEL - Abnormal; Notable for the following:       Result Value   Creatinine, Ser 1.04 (*)    Calcium 8.7 (*)    Total Protein 5.8 (*)  Albumin 3.4 (*)    GFR calc non Af Amer 58 (*)    All other components within normal limits  CBC WITH DIFFERENTIAL/PLATELET - Abnormal; Notable for the following:    Hemoglobin 11.2 (*)    HCT 35.6 (*)    RDW 15.6 (*)    All other components within normal limits  URINALYSIS, ROUTINE W REFLEX MICROSCOPIC - Abnormal; Notable for the following:    Leukocytes, UA LARGE (*)    Squamous Epithelial / LPF 0-5 (*)    All other components within normal limits    EKG  EKG Interpretation None       Radiology Ct Head Wo Contrast  Result Date: 08/15/2016 CLINICAL DATA:  Dizziness, nausea. EXAM: CT HEAD WITHOUT CONTRAST TECHNIQUE: Contiguous axial images were obtained from the base of the skull through the vertex without intravenous contrast. COMPARISON:  CT scan of August 07, 2012. FINDINGS: Brain: No evidence of acute infarction, hemorrhage, hydrocephalus, extra-axial collection or mass lesion/mass effect. Vascular: Atherosclerosis of carotid siphons is noted. Skull: Normal. Negative for fracture or focal lesion. Sinuses/Orbits: No acute finding. Other: None. IMPRESSION: No acute intracranial abnormality seen. Electronically Signed   By: Marijo Conception, M.D.   On: 08/15/2016 12:53    Procedures Procedures (including critical care time)  Medications Ordered in ED Medications  sodium chloride 0.9 % bolus 500 mL (0 mLs Intravenous Stopped 08/15/16 1322)  meclizine (ANTIVERT) tablet 25 mg (25 mg Oral Given 08/15/16 1408)     Initial Impression / Assessment and Plan / ED Course  I have reviewed the triage vital signs and the nursing  notes.  Pertinent labs & imaging results that were available during my care of the patient were reviewed by me and considered in my medical decision making (see chart for details).  Patient does not have any focal neurological deficits.  I feel that this is a positional vertigo due to the fact that an ongoing for 2 weeks and position change makes the condition worse.  CT scan does not show any significant abnormality at this time.  I do not feel that this is a posterior circulation issue at this point, we will have the patient follow up with her primary doctor.  Patient is feeling no dizziness on ambulation at this time following the meclizine   Final Clinical Impressions(s) / ED Diagnoses   Final diagnoses:  None    New Prescriptions New Prescriptions   No medications on file     Dalia Heading, Hershal Coria 08/15/16 Kenney, Wenda Overland, MD 08/15/16 1531    Dalia Heading, PA-C 08/15/16 Fonda, Wenda Overland, MD 08/15/16 1536

## 2016-08-15 NOTE — ED Notes (Signed)
Patient transported to CT 

## 2016-08-15 NOTE — Discharge Instructions (Signed)
Your testing here today did not show any significant abnormality.  Follow-up with your primary care doctor, increase your fluid intake and rest as much as possible

## 2016-08-15 NOTE — ED Triage Notes (Signed)
She c/o persistent dizziness and nausea x 2-3 weeks. She states she has recently seen her pcp, who prescribed Phenergan and increased her Lisinopril; neither of which improved her dizziness. She is in no distress.

## 2016-08-15 NOTE — ED Notes (Signed)
Patient given water

## 2016-08-23 ENCOUNTER — Emergency Department (HOSPITAL_COMMUNITY)
Admission: EM | Admit: 2016-08-23 | Discharge: 2016-08-23 | Disposition: A | Discharge status: Home / Self Care | Payer: Medicare Other | Attending: Emergency Medicine | Admitting: Emergency Medicine

## 2016-08-23 ENCOUNTER — Encounter (HOSPITAL_COMMUNITY): Payer: Self-pay | Admitting: *Deleted

## 2016-08-23 DIAGNOSIS — J449 Chronic obstructive pulmonary disease, unspecified: Secondary | ICD-10-CM | POA: Insufficient documentation

## 2016-08-23 DIAGNOSIS — J45909 Unspecified asthma, uncomplicated: Secondary | ICD-10-CM | POA: Insufficient documentation

## 2016-08-23 DIAGNOSIS — I10 Essential (primary) hypertension: Secondary | ICD-10-CM | POA: Diagnosis not present

## 2016-08-23 DIAGNOSIS — Z79899 Other long term (current) drug therapy: Secondary | ICD-10-CM | POA: Insufficient documentation

## 2016-08-23 DIAGNOSIS — R51 Headache: Secondary | ICD-10-CM | POA: Insufficient documentation

## 2016-08-23 DIAGNOSIS — R42 Dizziness and giddiness: Secondary | ICD-10-CM | POA: Diagnosis present

## 2016-08-23 DIAGNOSIS — R519 Headache, unspecified: Secondary | ICD-10-CM

## 2016-08-23 LAB — CBC
HCT: 38.3 % (ref 36.0–46.0)
HEMOGLOBIN: 12.2 g/dL (ref 12.0–15.0)
MCH: 28.6 pg (ref 26.0–34.0)
MCHC: 31.9 g/dL (ref 30.0–36.0)
MCV: 89.9 fL (ref 78.0–100.0)
PLATELETS: 290 10*3/uL (ref 150–400)
RBC: 4.26 MIL/uL (ref 3.87–5.11)
RDW: 15.5 % (ref 11.5–15.5)
WBC: 6.5 10*3/uL (ref 4.0–10.5)

## 2016-08-23 LAB — BASIC METABOLIC PANEL
Anion gap: 8 (ref 5–15)
BUN: 20 mg/dL (ref 6–20)
CHLORIDE: 107 mmol/L (ref 101–111)
CO2: 30 mmol/L (ref 22–32)
CREATININE: 1.09 mg/dL — AB (ref 0.44–1.00)
Calcium: 9.4 mg/dL (ref 8.9–10.3)
GFR calc non Af Amer: 55 mL/min — ABNORMAL LOW (ref >60)
Glucose, Bld: 79 mg/dL (ref 65–99)
POTASSIUM: 3.3 mmol/L — AB (ref 3.5–5.1)
SODIUM: 145 mmol/L (ref 135–145)

## 2016-08-23 LAB — SEDIMENTATION RATE: SED RATE: 9 mm/h (ref 0–22)

## 2016-08-23 MED ORDER — MORPHINE SULFATE (PF) 4 MG/ML IV SOLN
4.0000 mg | Freq: Once | INTRAVENOUS | Status: AC
  Administered 2016-08-23: 4 mg via INTRAVENOUS
  Filled 2016-08-23: qty 1

## 2016-08-23 MED ORDER — SODIUM CHLORIDE 0.9 % IV SOLN
1000.0000 mL | Freq: Once | INTRAVENOUS | Status: AC
  Administered 2016-08-23: 1000 mL via INTRAVENOUS

## 2016-08-23 MED ORDER — SODIUM CHLORIDE 0.9 % IV SOLN: 1000.0000 mL | INTRAVENOUS | Status: DC

## 2016-08-23 MED ORDER — ONDANSETRON 4 MG PO TBDP: 4.0000 mg | ORAL_TABLET | Freq: Three times a day (TID) | ORAL | 0 refills | Status: DC | PRN

## 2016-08-23 MED ORDER — DIAZEPAM 5 MG PO TABS: 5.0000 mg | ORAL_TABLET | Freq: Two times a day (BID) | ORAL | 0 refills | Status: DC

## 2016-08-23 MED ORDER — DIPHENHYDRAMINE HCL 50 MG/ML IJ SOLN
25.0000 mg | Freq: Once | INTRAMUSCULAR | Status: AC
  Administered 2016-08-23: 25 mg via INTRAVENOUS
  Filled 2016-08-23: qty 1

## 2016-08-23 MED ORDER — METOCLOPRAMIDE HCL 5 MG/ML IJ SOLN
10.0000 mg | Freq: Once | INTRAMUSCULAR | Status: AC
  Administered 2016-08-23: 10 mg via INTRAVENOUS
  Filled 2016-08-23: qty 2

## 2016-08-23 MED ORDER — LORAZEPAM 2 MG/ML IJ SOLN
1.0000 mg | Freq: Once | INTRAMUSCULAR | Status: AC
  Administered 2016-08-23: 1 mg via INTRAVENOUS
  Filled 2016-08-23: qty 1

## 2016-08-23 NOTE — ED Notes (Signed)
Pt wheeled to waiting room. Pt verbalized understanding of discharge instructions.   

## 2016-08-23 NOTE — ED Triage Notes (Signed)
Pt arrived by EMS from home. Pt c/o dizziness for the past 2 weeks. Pt was seen at [place] a week ago for the same. Neg CT. Was put on meclizine but pt says makes her worse.

## 2016-08-23 NOTE — ED Provider Notes (Signed)
[facility] DEPT Provider Note   CSN: [id number] Arrival date & time: 08/23/16  1913     History   Chief Complaint Chief Complaint  Patient presents with  . Dizziness    HPI Deborah Murray is a 56 y.o. female.  Pt presents to the ED today for dizziness and headache.  Sx have been going on for 2 weeks.  Pt was seen at [facility] hospital on 5/5 for the same.  She had a negative CT.  She felt better with IVFs and antivert, so she was d/c on antivert.  Since d/c, she said antivert made sx worse, so she stopped taking it.  She did f/u with her pcp and was seen on 5/11.  Plan was for mri if sx persisted.  Pt came to the ED today b/c she has a headache and is still dizzy.      Past Medical History:  Diagnosis Date  . Adrenal abnormality ([place])   . Adrenal insufficiency ([place]) 2013   Tx at [facility]  . Asthma   . Atrial fibrillation ([place])   . Chronic back pain   . COPD (chronic obstructive pulmonary disease) ([place])   . Glaucoma   . Headache   . Hyperlipidemia   . Hypertension   . Lumbar radiculopathy   . Sepsis ([place]) 11/04/2015  . Syncope and collapse 05/11/11  adrenal insuffiency  treated at [facility]  . Thyroid disease   . Vocal cord dysfunction     Patient Active Problem List   Diagnosis Date Noted  . Anasarca 11/18/2015  . Generalized abdominal pain   . Protein-calorie malnutrition, severe 11/05/2015  . Sepsis ([place]) 11/04/2015  . AKI (acute kidney injury) ([place])   . Pyrexia   . Acute kidney injury ([place]) 11/02/2015  . Hyponatremia 11/02/2015  . Metabolic acidosis 65/06/5463  . Hypocalcemia 11/01/2015  . C. difficile colitis 10/31/2015  . Pancolitis ([place]) 10/30/2015  . C. difficile diarrhea 10/30/2015  . Abdominal pain in female 10/30/2015  . Hypotension 10/30/2015  . History of atrial fibrillation- NSR now 10/30/2015  . History of adrenal insufficiency 10/30/2015  . History of hypertension 10/30/2015  . Esophageal reflux   . Nausea   . GERD (gastroesophageal reflux  disease) 08/28/2015  . Nausea without vomiting 08/28/2015  . Abdominal pain, epigastric 08/28/2015  . Dark stools 08/28/2015  . Atrial fibrillation with RVR ([place]) 05/12/2014  . Hypokalemia 05/12/2014  . Essential hypertension 05/12/2014  . Severe persistent asthma 05/12/2014  . OSA on CPAP 05/12/2014  . Toxic nodular goiter 05/12/2014  . Morbid obesity ([place]) 05/12/2014  . Pain in joint, shoulder region 08/02/2013  . Muscle weakness (generalized) 08/02/2013  . Decreased range of motion of left shoulder 08/02/2013  . Tight fascia 08/02/2013    Past Surgical History:  Procedure Laterality Date  . ABDOMINAL HYSTERECTOMY    . BACK SURGERY     disc   . CARDIAC CATHETERIZATION  2012 at [facility]  . CHOLECYSTECTOMY    . CHOLECYSTECTOMY, LAPAROSCOPIC    . CLOSED REDUCTION METACARPAL WITH PERCUTANEOUS PINNING Left 03/26/2016   Procedure: CLOSED REDUCTION METACARPAL WITH PERCUTANEOUS PINNING;  Surgeon: Leanora Cover, MD;  Location: [facility];  Service: Orthopedics;  Laterality: Left;  . ESOPHAGOGASTRODUODENOSCOPY N/A 09/17/2015   Procedure: ESOPHAGOGASTRODUODENOSCOPY (EGD);  Surgeon: Daneil Dolin, MD;  [facility];  Service: Endoscopy;  Laterality: N/A;  6812  . eye surg for glaucoma    . gastic by-pass  05-11-11   Gastric by-pass in 2008  .  right knee surg      OB History    No data available       Home Medications    Prior to Admission medications   Medication Sig Start Date End Date Taking? Authorizing Provider  albuterol (PROVENTIL HFA;VENTOLIN HFA) 108 (90 BASE) MCG/ACT inhaler Inhale 2 puffs into the lungs every 4 (four) hours as needed for wheezing.    Yes [provider]  aluminum chloride (DRYSOL) 20 % external solution Apply 1 application topically daily. 05/13/09  Yes [provider]  atorvastatin (LIPITOR) 20 MG tablet Take 20 mg by mouth every evening.    Yes [provider]  brimonidine (ALPHAGAN) 0.2 % ophthalmic  solution Place 1 drop into both eyes 2 (two) times daily. 09/05/15  Yes [provider]  Calcium Carb-Ergocalciferol 500-200 MG-UNIT TABS Take 1 tablet by mouth daily.   Yes [provider]  Cholecalciferol (VITAMIN D) 2000 UNITS tablet Take 2,000 Units by mouth daily.   Yes [provider]  citalopram (CELEXA) 20 MG tablet Take 20 mg by mouth at bedtime.    Yes [provider]  clindamycin (CLEOCIN T) 1 % external solution Apply 1 application topically every evening. Applies to underarms 11/08/09  Yes [provider]  DULoxetine (CYMBALTA) 20 MG capsule Take 40 mg by mouth daily. 07/20/16  Yes [provider]  fluticasone (FLOVENT HFA) 220 MCG/ACT inhaler Inhale 2 puffs into the lungs every 4 (four) hours. 06/24/09  Yes [provider]  gabapentin (NEURONTIN) 600 MG tablet Take 600 mg by mouth 3 (three) times daily. 12/09/15  Yes [provider]  hydrochlorothiazide (HYDRODIURIL) 25 MG tablet Take 25 mg by mouth daily. 08/11/16  Yes [provider]  ipratropium (ATROVENT) 0.02 % nebulizer solution Take 2.5 mLs by nebulization 4 (four) times daily as needed for wheezing or shortness of breath.  06/16/15  Yes [provider]  latanoprost (XALATAN) 0.005 % ophthalmic solution Place 1 drop into both eyes at bedtime.   Yes [provider]  levothyroxine (SYNTHROID, LEVOTHROID) 50 MCG tablet Take 50 mcg by mouth daily. 08/07/16  Yes [provider]  lisinopril (PRINIVIL,ZESTRIL) 20 MG tablet Take 20 mg by mouth daily. 08/07/16  Yes [provider]  loratadine (CLARITIN) 10 MG tablet Take 10 mg by mouth daily. 12/09/15  Yes [provider]  Melatonin 3 MG TABS Take 3 mg by mouth at bedtime.   Yes [provider]  montelukast (SINGULAIR) 10 MG tablet Take 10 mg by mouth at bedtime.   Yes [provider]  Multiple Vitamin (MULTIVITAMIN) capsule Take 1 capsule by mouth daily.    Yes [provider]  nitroGLYCERIN (NITROSTAT) 0.4 MG SL tablet Place 0.4 mg under the tongue every 5 (five) minutes as needed for chest pain.   Yes [provider]  omalizumab Arvid Right) 150 MG injection Inject 300 mg into the skin every 30 (thirty) days. 06/11/15  Yes [provider]  predniSONE (DELTASONE) 1 MG tablet Take 6 mg by mouth daily. Takes 6 1mg  tablets with 5mg  tablet for a total of 12mg  daily 06/18/16  Yes [provider]  predniSONE (DELTASONE) 5 MG tablet Take 12 mg by mouth daily. Takes 6 1mg  tablets with 5mg  tablet for a total of 12mg  daily 12/09/15  Yes [provider]  promethazine (PHENERGAN) 25 MG tablet Take 25 mg by mouth every 4 (four) hours as needed for nausea or vomiting.  12/17/15  Yes [provider]  ranitidine (  ZANTAC) 150 MG tablet Take 150 mg by mouth 2 (two) times daily. 06/25/16 06/25/17 Yes [provider]  rivaroxaban (XARELTO) 20 MG TABS tablet Take 1 tablet (20 mg total) by mouth daily with supper. 05/13/14  Yes Hongalgi, Lenis Dickinson, MD  tiotropium (SPIRIVA HANDIHALER) 18 MCG inhalation capsule Place 1 capsule into inhaler and inhale daily. 12/09/09  Yes [provider]  VOLTAREN 1 % GEL Apply 1 application topically daily. 06/01/16  Yes [provider]  diazepam (VALIUM) 5 MG tablet Take 1 tablet (5 mg total) by mouth 2 (two) times daily. 08/23/16   Isla Pence, MD  EPINEPHrine (EPIPEN JR) 0.15 MG/0.3ML injection Inject 0.15 mg into the muscle as needed. 06/11/15   [provider]  meclizine (ANTIVERT) 25 MG tablet Take 1 tablet (25 mg total) by mouth 3 (three) times daily as needed for dizziness. Patient not taking: Reported on 08/23/2016 08/15/16   Dalia Heading, PA-C  ondansetron (ZOFRAN ODT) 4 MG disintegrating tablet Take 1 tablet (4 mg total) by mouth every 8 (eight) hours as needed. 08/23/16   Isla Pence, MD    Family History Family History  Problem Relation Age of  Onset  . Heart attack Mother   . Hypertension Mother   . Heart attack Father   . Asthma Father   . Hyperlipidemia Father   . Hypertension Father   . Heart attack Sister   . Arrhythmia Sister   . Asthma Sister   . Hyperlipidemia Sister   . Hypertension Sister   . Asthma Brother   . Hypertension Brother     Social History Social History  Substance Use Topics  . Smoking status: Never Smoker  . Smokeless tobacco: Never Used  . Alcohol use No     Allergies   Aspirin; Codeine sulfate; Darvocet [propoxyphene n-acetaminophen]; Nsaids; Tramadol; and Penicillins   Review of Systems Review of Systems  Neurological: Positive for dizziness and headaches.  All other systems reviewed and are negative.    Physical Exam Updated Vital Signs BP 95/63 (BP Location: Left Arm)   Pulse (!) 16   Temp 98.2 F (36.8 C) (Oral)   Resp 16   Ht 5\' 2"  (1.575 m)   Wt 230 lb (104.3 kg)   SpO2 95%   BMI 42.07 kg/m   Physical Exam  Constitutional: She appears well-developed and well-nourished.  HENT:  Head: Normocephalic and atraumatic.  Right Ear: External ear normal.  Left Ear: External ear normal.  Nose: Nose normal.  Mouth/Throat: Oropharynx is clear and moist.  Eyes: Conjunctivae and EOM are normal. Pupils are equal, round, and reactive to light.  Neck: Normal range of motion. Neck supple.  Cardiovascular: Normal rate, regular rhythm, normal heart sounds and intact distal pulses.   Pulmonary/Chest: Effort normal and breath sounds normal.  Abdominal: Soft. Bowel sounds are normal.  Musculoskeletal: Normal range of motion.  Neurological: She is alert.  Skin: Skin is warm.  Psychiatric: She has a normal mood and affect. Her behavior is normal. Judgment and thought content normal.  Nursing note and vitals reviewed.    ED Treatments / Results  Labs (all labs ordered are listed, but only abnormal results are displayed) Labs Reviewed  BASIC METABOLIC PANEL - Abnormal; Notable for  the following:       Result Value   Potassium 3.3 (*)    Creatinine, Ser 1.09 (*)    GFR calc non Af Amer 55 (*)    All other components within normal limits  CBC  SEDIMENTATION RATE    EKG  EKG Interpretation  Date/Time:  Sunday Aug 23 2016 19:46:18 EDT Ventricular Rate:  57 PR Interval:    QRS Duration: 89 QT Interval:  459 QTC Calculation: 447 R Axis:   25 Text Interpretation:  Sinus rhythm Confirmed by Jazaria Jarecki MD, Jericka Kadar ([id number]) on 08/23/2016 7:49:22 PM       Radiology No results found.  Procedures Procedures (including critical care time)  Medications Ordered in ED Medications  0.9 %  sodium chloride infusion (1,000 mLs Intravenous New Bag/Given 08/23/16 1942)    Followed by  0.9 %  sodium chloride infusion (not administered)  metoCLOPramide (REGLAN) injection 10 mg (10 mg Intravenous Given 08/23/16 1941)  morphine 4 MG/ML injection 4 mg (4 mg Intravenous Given 08/23/16 1942)  diphenhydrAMINE (BENADRYL) injection 25 mg (25 mg Intravenous Given 08/23/16 1942)  LORazepam (ATIVAN) injection 1 mg (1 mg Intravenous Given 08/23/16 1941)     Initial Impression / Assessment and Plan / ED Course  I have reviewed the triage vital signs and the nursing notes.  Pertinent labs & imaging results that were available during my care of the patient were reviewed by me and considered in my medical decision making (see chart for details).     Pt is feeling better.  She is able to walk to the bathroom.  We are unable to get an MRI tonight here, but I will order one to be done as an outpatient.  Pt does have good f/u with her pcp.  She is also instr to f/u with neurology.  Return if worse.  Final Clinical Impressions(s) / ED Diagnoses   Final diagnoses:  Dizziness  Acute nonintractable headache, unspecified headache type    New Prescriptions New Prescriptions   DIAZEPAM (VALIUM) 5 MG TABLET    Take 1 tablet (5 mg total) by mouth 2 (two) times daily.   ONDANSETRON (ZOFRAN ODT)  4 MG DISINTEGRATING TABLET    Take 1 tablet (4 mg total) by mouth every 8 (eight) hours as needed.     Isla Pence, MD 08/23/16 2104

## 2016-09-01 ENCOUNTER — Encounter: Payer: Self-pay | Admitting: Gastroenterology

## 2016-09-03 ENCOUNTER — Encounter (HOSPITAL_COMMUNITY): Payer: Self-pay | Admitting: *Deleted

## 2016-09-03 ENCOUNTER — Emergency Department (HOSPITAL_COMMUNITY)
Admission: EM | Admit: 2016-09-03 | Discharge: 2016-09-04 | Disposition: A | Discharge status: Home / Self Care | Payer: Medicare Other | Attending: Emergency Medicine | Admitting: Emergency Medicine

## 2016-09-03 DIAGNOSIS — I10 Essential (primary) hypertension: Secondary | ICD-10-CM | POA: Diagnosis not present

## 2016-09-03 DIAGNOSIS — J449 Chronic obstructive pulmonary disease, unspecified: Secondary | ICD-10-CM | POA: Insufficient documentation

## 2016-09-03 DIAGNOSIS — Z955 Presence of coronary angioplasty implant and graft: Secondary | ICD-10-CM | POA: Diagnosis not present

## 2016-09-03 DIAGNOSIS — Z79899 Other long term (current) drug therapy: Secondary | ICD-10-CM | POA: Insufficient documentation

## 2016-09-03 DIAGNOSIS — E876 Hypokalemia: Secondary | ICD-10-CM | POA: Diagnosis not present

## 2016-09-03 DIAGNOSIS — Z7902 Long term (current) use of antithrombotics/antiplatelets: Secondary | ICD-10-CM | POA: Diagnosis not present

## 2016-09-03 DIAGNOSIS — R42 Dizziness and giddiness: Secondary | ICD-10-CM | POA: Insufficient documentation

## 2016-09-03 LAB — COMPREHENSIVE METABOLIC PANEL
ALBUMIN: 3.3 g/dL — AB (ref 3.5–5.0)
ALK PHOS: 79 U/L (ref 38–126)
ALT: 19 U/L (ref 14–54)
ANION GAP: 7 (ref 5–15)
AST: 21 U/L (ref 15–41)
BILIRUBIN TOTAL: 0.7 mg/dL (ref 0.3–1.2)
BUN: 14 mg/dL (ref 6–20)
CALCIUM: 9.3 mg/dL (ref 8.9–10.3)
CO2: 31 mmol/L (ref 22–32)
CREATININE: 1.05 mg/dL — AB (ref 0.44–1.00)
Chloride: 104 mmol/L (ref 101–111)
GFR calc Af Amer: >60 mL/min (ref >60)
GFR calc non Af Amer: 57 mL/min — ABNORMAL LOW (ref >60)
GLUCOSE: 93 mg/dL (ref 65–99)
Potassium: 2.9 mmol/L — ABNORMAL LOW (ref 3.5–5.1)
Sodium: 142 mmol/L (ref 135–145)
TOTAL PROTEIN: 5.9 g/dL — AB (ref 6.5–8.1)

## 2016-09-03 LAB — MAGNESIUM: MAGNESIUM: 2.2 mg/dL (ref 1.7–2.4)

## 2016-09-03 LAB — CBC WITH DIFFERENTIAL/PLATELET
BASOS ABS: 0.1 10*3/uL (ref 0.0–0.1)
BASOS PCT: 1 %
EOS ABS: 0.1 10*3/uL (ref 0.0–0.7)
EOS PCT: 2 %
HEMATOCRIT: 38.2 % (ref 36.0–46.0)
Hemoglobin: 12.3 g/dL (ref 12.0–15.0)
Lymphocytes Relative: 43 %
Lymphs Abs: 2 10*3/uL (ref 0.7–4.0)
MCH: 28 pg (ref 26.0–34.0)
MCHC: 32.2 g/dL (ref 30.0–36.0)
MCV: 87 fL (ref 78.0–100.0)
MONO ABS: 0.5 10*3/uL (ref 0.1–1.0)
Monocytes Relative: 10 %
NEUTROS ABS: 2.2 10*3/uL (ref 1.7–7.7)
Neutrophils Relative %: 44 %
PLATELETS: 261 10*3/uL (ref 150–400)
RBC: 4.39 MIL/uL (ref 3.87–5.11)
RDW: 14.2 % (ref 11.5–15.5)
WBC: 4.8 10*3/uL (ref 4.0–10.5)

## 2016-09-03 MED ORDER — POTASSIUM CHLORIDE CRYS ER 20 MEQ PO TBCR: 20.0000 meq | EXTENDED_RELEASE_TABLET | Freq: Two times a day (BID) | ORAL | 0 refills | Status: DC

## 2016-09-03 MED ORDER — POTASSIUM CHLORIDE CRYS ER 20 MEQ PO TBCR
40.0000 meq | EXTENDED_RELEASE_TABLET | Freq: Once | ORAL | Status: AC
  Administered 2016-09-03: 40 meq via ORAL
  Filled 2016-09-03: qty 2

## 2016-09-03 NOTE — ED Notes (Signed)
ED Provider at bedside. 

## 2016-09-03 NOTE — ED Triage Notes (Signed)
Pt with constant dizziness and HA for 4 weeks.

## 2016-09-03 NOTE — ED Provider Notes (Signed)
[facility] DEPT Provider Note   CSN: [id number] Arrival date & time: 09/03/16  2124  By signing my name below, I, Hansel Feinstein, attest that this documentation has been prepared under the direction and in the presence of Delora Fuel, MD. Electronically Signed: Hansel Feinstein, ED Scribe. 09/03/16. 10:43 PM.    History   Chief Complaint Chief Complaint  Patient presents with  . Dizziness    HPI Deborah Murray is a 56 y.o. female with h/o Afib on Xarelto who presents to the Emergency Department complaining of daily, worsening dizziness for 4 weeks with associated intermittent frontal HA. Pt describes her dizziness as feeling "drunk" or lightheaded. Pt reports she was sent to UC by her doctor for the dizziness, where they increased her dose of Lisinopril. She also stopped taking Gabapentin today. Pt has also tried Meclizine and Valium after additional ED visits, which she reports provided no relief. Pt has had MRI after the onset of dizziness that was normal. Pt saw a neurologist yesterday who made no changes to her tx. Pt states her dizziness is triggered by standing and moving, and alleviated while lying down. Pt also reports she has been off balance with her dizziness, with multiple falls. She denies nausea, vomiting, CP, chest heaviness, chest pressure or tightness, tinnitus.   The history is provided by the patient. No language interpreter was used.    Past Medical History:  Diagnosis Date  . Adrenal abnormality ([place])   . Adrenal insufficiency ([place]) 2013   Tx at [facility]  . Asthma   . Atrial fibrillation ([place])   . Chronic back pain   . COPD (chronic obstructive pulmonary disease) ([place])   . Glaucoma   . Headache   . Hyperlipidemia   . Hypertension   . Lumbar radiculopathy   . Sepsis ([place]) 11/04/2015  . Syncope and collapse 05/11/11  adrenal insuffiency  treated [facility]  . Thyroid disease   . Vocal cord dysfunction     Patient Active Problem List   Diagnosis Date Noted  .  Anasarca 11/18/2015  . Generalized abdominal pain   . Protein-calorie malnutrition, severe 11/05/2015  . Sepsis ([place]) 11/04/2015  . AKI (acute kidney injury) ([place])   . Pyrexia   . Acute kidney injury ([place]) 11/02/2015  . Hyponatremia 11/02/2015  . Metabolic acidosis 73/53/2992  . Hypocalcemia 11/01/2015  . C. difficile colitis 10/31/2015  . Pancolitis ([place]) 10/30/2015  . C. difficile diarrhea 10/30/2015  . Abdominal pain in female 10/30/2015  . Hypotension 10/30/2015  . History of atrial fibrillation- NSR now 10/30/2015  . History of adrenal insufficiency 10/30/2015  . History of hypertension 10/30/2015  . Esophageal reflux   . Nausea   . GERD (gastroesophageal reflux disease) 08/28/2015  . Nausea without vomiting 08/28/2015  . Abdominal pain, epigastric 08/28/2015  . Dark stools 08/28/2015  . Atrial fibrillation with RVR ([place]) 05/12/2014  . Hypokalemia 05/12/2014  . Essential hypertension 05/12/2014  . Severe persistent asthma 05/12/2014  . OSA on CPAP 05/12/2014  . Toxic nodular goiter 05/12/2014  . Morbid obesity ([place]) 05/12/2014  . Pain in joint, shoulder region 08/02/2013  . Muscle weakness (generalized) 08/02/2013  . Decreased range of motion of left shoulder 08/02/2013  . Tight fascia 08/02/2013    Past Surgical History:  Procedure Laterality Date  . ABDOMINAL HYSTERECTOMY    . BACK SURGERY     disc   . CARDIAC CATHETERIZATION  2012 at [facility]  . CHOLECYSTECTOMY    . CHOLECYSTECTOMY, LAPAROSCOPIC    .  CLOSED REDUCTION METACARPAL WITH PERCUTANEOUS PINNING Left 03/26/2016   Procedure: CLOSED REDUCTION METACARPAL WITH PERCUTANEOUS PINNING;  Surgeon: Leanora Cover, MD;  Location: [facility];  Service: Orthopedics;  Laterality: Left;  . ESOPHAGOGASTRODUODENOSCOPY N/A 09/17/2015   Procedure: ESOPHAGOGASTRODUODENOSCOPY (EGD);  Surgeon: Daneil Dolin, MD;  [facility];  Service: Endoscopy;  Laterality: N/A;  6734  . eye surg for glaucoma    .  gastic by-pass  05-11-11   Gastric by-pass in 2008  . right knee surg      OB History    No data available       Home Medications    Prior to Admission medications   Medication Sig Start Date End Date Taking? Authorizing Provider  albuterol (PROVENTIL HFA;VENTOLIN HFA) 108 (90 BASE) MCG/ACT inhaler Inhale 2 puffs into the lungs every 4 (four) hours as needed for wheezing.     [provider]  aluminum chloride (DRYSOL) 20 % external solution Apply 1 application topically daily. 05/13/09   [provider]  atorvastatin (LIPITOR) 20 MG tablet Take 20 mg by mouth every evening.     [provider]  brimonidine (ALPHAGAN) 0.2 % ophthalmic solution Place 1 drop into both eyes 2 (two) times daily. 09/05/15   [provider]  Calcium Carb-Ergocalciferol 500-200 MG-UNIT TABS Take 1 tablet by mouth daily.    [provider]  Cholecalciferol (VITAMIN D) 2000 UNITS tablet Take 2,000 Units by mouth daily.    [provider]  citalopram (CELEXA) 20 MG tablet Take 20 mg by mouth at bedtime.     [provider]  clindamycin (CLEOCIN T) 1 % external solution Apply 1 application topically every evening. Applies to underarms 11/08/09   [provider]  diazepam (VALIUM) 5 MG tablet Take 1 tablet (5 mg total) by mouth 2 (two) times daily. 08/23/16   Isla Pence, MD  DULoxetine (CYMBALTA) 20 MG capsule Take 40 mg by mouth daily. 07/20/16   [provider]  EPINEPHrine (EPIPEN JR) 0.15 MG/0.3ML injection Inject 0.15 mg into the muscle as needed. 06/11/15   [provider]  fluticasone (FLOVENT HFA) 220 MCG/ACT inhaler Inhale 2 puffs into the lungs every 4 (four) hours. 06/24/09   [provider]  gabapentin (NEURONTIN) 600 MG tablet Take 600 mg by mouth 3 (three) times daily. 12/09/15   [provider]  hydrochlorothiazide (HYDRODIURIL) 25 MG tablet Take 25 mg by mouth daily. 08/11/16   [provider]   ipratropium (ATROVENT) 0.02 % nebulizer solution Take 2.5 mLs by nebulization 4 (four) times daily as needed for wheezing or shortness of breath.  06/16/15   [provider]  latanoprost (XALATAN) 0.005 % ophthalmic solution Place 1 drop into both eyes at bedtime.    [provider]  levothyroxine (SYNTHROID, LEVOTHROID) 50 MCG tablet Take 50 mcg by mouth daily. 08/07/16   [provider]  lisinopril (PRINIVIL,ZESTRIL) 20 MG tablet Take 20 mg by mouth daily. 08/07/16   [provider]  loratadine (CLARITIN) 10 MG tablet Take 10 mg by mouth daily. 12/09/15   [provider]  meclizine (ANTIVERT) 25 MG tablet Take 1 tablet (25 mg total) by mouth 3 (three) times daily as needed for dizziness. Patient not taking: Reported on 08/23/2016 08/15/16   Dalia Heading, PA-C  Melatonin 3 MG TABS Take 3 mg by mouth at bedtime.    [provider]  montelukast (SINGULAIR) 10 MG tablet Take 10 mg by mouth at bedtime.  [provider]  Multiple Vitamin (MULTIVITAMIN) capsule Take 1 capsule by mouth daily.    [provider]  nitroGLYCERIN (NITROSTAT) 0.4 MG SL tablet Place 0.4 mg under the tongue every 5 (five) minutes as needed for chest pain.    [provider]  omalizumab Arvid Right) 150 MG injection Inject 300 mg into the skin every 30 (thirty) days. 06/11/15   [provider]  ondansetron (ZOFRAN ODT) 4 MG disintegrating tablet Take 1 tablet (4 mg total) by mouth every 8 (eight) hours as needed. 08/23/16   Isla Pence, MD  predniSONE (DELTASONE) 1 MG tablet Take 6 mg by mouth daily. Takes 6 1mg  tablets with 5mg  tablet for a total of 12mg  daily 06/18/16   [provider]  predniSONE (DELTASONE) 5 MG tablet Take 12 mg by mouth daily. Takes 6 1mg  tablets with 5mg  tablet for a total of 12mg  daily 12/09/15   [provider]  promethazine (PHENERGAN) 25 MG tablet Take 25 mg by mouth every 4 (four) hours as needed  for nausea or vomiting.  12/17/15   [provider]  ranitidine (ZANTAC) 150 MG tablet Take 150 mg by mouth 2 (two) times daily. 06/25/16 06/25/17  [provider]  rivaroxaban (XARELTO) 20 MG TABS tablet Take 1 tablet (20 mg total) by mouth daily with supper. 05/13/14   Hongalgi, Lenis Dickinson, MD  tiotropium (SPIRIVA HANDIHALER) 18 MCG inhalation capsule Place 1 capsule into inhaler and inhale daily. 12/09/09   [provider]  VOLTAREN 1 % GEL Apply 1 application topically daily. 06/01/16   [provider]    Family History Family History  Problem Relation Age of Onset  . Heart attack Mother   . Hypertension Mother   . Heart attack Father   . Asthma Father   . Hyperlipidemia Father   . Hypertension Father   . Heart attack Sister   . Arrhythmia Sister   . Asthma Sister   . Hyperlipidemia Sister   . Hypertension Sister   . Asthma Brother   . Hypertension Brother     Social History Social History  Substance Use Topics  . Smoking status: Never Smoker  . Smokeless tobacco: Never Used  . Alcohol use No     Allergies   Aspirin; Codeine sulfate; Darvocet [propoxyphene n-acetaminophen]; Nsaids; Tramadol; and Penicillins   Review of Systems Review of Systems  HENT: Negative for tinnitus.   Respiratory: Negative for chest tightness.   Cardiovascular: Negative for chest pain.  Gastrointestinal: Negative for nausea and vomiting.  Neurological: Positive for dizziness, light-headedness and headaches.  All other systems reviewed and are negative.    Physical Exam Updated Vital Signs BP 101/78   Pulse (!) 51   Temp 98.4 F (36.9 C) (Oral)   Resp 18   Ht 5\' 2"  (1.575 m)   Wt 230 lb (104.3 kg)   SpO2 98%   BMI 42.07 kg/m   Physical Exam  Constitutional: She is oriented to person, place, and time. She appears well-developed and well-nourished.  HENT:  Head: Normocephalic and atraumatic.  Tympanic membranes are normal.  Eyes: EOM are normal.  Pupils are equal, round, and reactive to light.  Neck: Normal range of motion. Neck supple. No JVD present.  Cardiovascular: Normal rate, regular rhythm and normal heart sounds.   No murmur heard. Pulmonary/Chest: Effort normal and breath sounds normal. She has no wheezes. She has no rales. She exhibits no tenderness.  Abdominal: Soft. Bowel sounds are normal. She exhibits no distension  and no mass. There is no tenderness.  Musculoskeletal: Normal range of motion. She exhibits no edema.  Lymphadenopathy:    She has no cervical adenopathy.  Neurological: She is alert and oriented to person, place, and time. No cranial nerve deficit. She exhibits normal muscle tone. Coordination normal.  Dizziness present with passive head movement, but different from the dizziness she has been experiencing. Negative Dix-Hallpike maneuver.  Skin: Skin is warm and dry. No rash noted.  Psychiatric: She has a normal mood and affect. Her behavior is normal. Judgment and thought content normal.  Nursing note and vitals reviewed.    ED Treatments / Results   DIAGNOSTIC STUDIES: Oxygen Saturation is 98% on RA, normal by my interpretation.    COORDINATION OF CARE: 10:30 PM Discussed treatment plan with pt at bedside which includes labs, orthostatic vital signs and pt agreed to plan.    Labs (all labs ordered are listed, but only abnormal results are displayed) Labs Reviewed  CBC WITH DIFFERENTIAL/PLATELET  COMPREHENSIVE METABOLIC PANEL  MAGNESIUM    Procedures Procedures (including critical care time)  Medications Ordered in ED Medications - No data to display   Initial Impression / Assessment and Plan / ED Course  I have reviewed the triage vital signs and the nursing notes.  Pertinent lab results that were available during my care of the patient were reviewed by me and considered in my medical decision making (see chart for details).  Persistent dizziness which is positional in that it is  present when standing. Review of old records shows 2 ED visits for dizziness which was treated first with meclizine, then with diazepam-neither which were effective. MRI scan obtained in the past week showed no acute process. Orthostatic vital signs show no significant changes-even though symptoms are present only when standing. Cause is unclear. We'll check screening labs. Anticipate need for referral to neurology for further evaluation. Review of her medications show none that are likely to cause dizziness other than gabapentin, which she had already stopped taking.  Workup shows severe hypokalemia with potassium 2.9. This is presumably secondary to hydrochlorothiazide which she is taking for blood pressure. She is given a dose of potassium in the emergency department and discharged with prescription for potassium. I've advised her that I do not think this is likely to be the cause of her dizziness, since potassium was only minimally low on 5/13 (3.3), and was normal on 5/5 (3.5). She is referred back to her PCP to recheck her potassium level, and referred to neurology for further evaluation of her dizziness.  Final Clinical Impressions(s) / ED Diagnoses   Final diagnoses:  Dizziness  Hypokalemia    New Prescriptions New Prescriptions   POTASSIUM CHLORIDE SA (K-DUR,KLOR-CON) 20 MEQ TABLET    Take 1 tablet (20 mEq total) by mouth 2 (two) times daily. Take two tablets three times a day for three days, then reduce to one tablet twice a day    I personally performed the services described in this documentation, which was scribed in my presence. The recorded information has been reviewed and is accurate.        Delora Fuel, MD 15/17/61 2352

## 2016-09-03 NOTE — Discharge Instructions (Signed)
Your evaluation did not find a clear cause for your dizziness. However, your potassium was found to be quite low. This is because of one of your blood pressure medications, hydrochlorothiazide. Please take the potassium pill as prescribed-2 pills, 3 times a day for 3 days; then 1 pill twice a day. You will need to follow-up with your primary care provider to check the potassium level in one-2 weeks. You may want to discuss with your primary care provider whether to change the medication is something that is less likely to lower your potassium.

## 2016-09-11 ENCOUNTER — Ambulatory Visit: Payer: Medicare Other | Admitting: Gastroenterology

## 2016-09-11 ENCOUNTER — Telehealth: Payer: Self-pay | Admitting: Gastroenterology

## 2016-09-11 ENCOUNTER — Encounter: Payer: Self-pay | Admitting: Gastroenterology

## 2016-09-11 NOTE — Telephone Encounter (Signed)
PATIENT WAS A NO SHOW AND LETTER SENT  °

## 2017-01-20 ENCOUNTER — Telehealth: Payer: Self-pay | Admitting: Internal Medicine

## 2017-01-20 NOTE — Telephone Encounter (Signed)
See other phone note for today. 

## 2017-01-20 NOTE — Telephone Encounter (Signed)
Our office received referral. Tried to call pt to inform her, no answer LMOVM.

## 2017-01-20 NOTE — Telephone Encounter (Signed)
[phone] patient called inquiring if we have received information from her cardiologist regarding her tcs being scheduled

## 2017-01-20 NOTE — Telephone Encounter (Signed)
PATIENT RETURNED CALL, PLEASE CALL BACK  °

## 2017-01-20 NOTE — Telephone Encounter (Signed)
Tried to call pt, no answer 

## 2017-02-04 ENCOUNTER — Telehealth: Payer: Self-pay

## 2017-02-04 ENCOUNTER — Encounter: Payer: Self-pay | Admitting: Gastroenterology

## 2017-02-04 ENCOUNTER — Ambulatory Visit (INDEPENDENT_AMBULATORY_CARE_PROVIDER_SITE_OTHER): Payer: Medicare Other | Admitting: Gastroenterology

## 2017-02-04 ENCOUNTER — Other Ambulatory Visit: Payer: Self-pay

## 2017-02-04 DIAGNOSIS — K625 Hemorrhage of anus and rectum: Secondary | ICD-10-CM

## 2017-02-04 MED ORDER — NA SULFATE-K SULFATE-MG SULF 17.5-3.13-1.6 GM/177ML PO SOLN: 1.0000 | ORAL | 0 refills | Status: DC

## 2017-02-04 NOTE — H&P (View-Only) (Signed)
Primary Care Physician:  Sol Passer, MD Primary Gastroenterologist:  Dr. Gala Romney   Chief Complaint  Patient presents with  . Blood In Stools    stopped Xarelto  . Abdominal Pain    HPI:   Deborah Murray is a delightful 56 y.o. female presenting today due to rectal bleeding, referred by cardiology. She has a history of  refractory/recurrent CDI. She completed FMT via capsules in Feb 2018. She has been followed by [facility] and now cleared from their standpoint. She had an excellent response. Last saw Gaspar Cola in May 2018.    Taken off Xarelto about 3 weeks ago. Had black tarry stool. A couple of times had some bright red blood. Continues to see black, tarry stool. Not on iron. No pepto bismol. Last Hgb 13.3 on Feb 02, 2017. Ferritin down to 17 from 49 (from March to September).   Notes upper abdominal discomfort, tender-like. Sometimes pain after eating. Feels nauseated all the time. No dysphagia. Had episode of diarrhea that resolved with Imodium. Has reflux. Takes Prevacid. Takes tylenol. No NSAIDs.  Last colonoscopy 2010 at [facility]: anal papilla, otherwise normal. EGD in June 2017 with normal esophagus, surgically altered stomach, no specimens collected.    Past Medical History:  Diagnosis Date  . Adrenal abnormality ([place])   . Adrenal insufficiency ([place]) 2013   Tx at [facility]  . Asthma   . Atrial fibrillation ([place])   . Chronic back pain   . COPD (chronic obstructive pulmonary disease) ([place])   . Glaucoma   . Headache   . Hyperlipidemia   . Hypertension   . Lumbar radiculopathy   . Sepsis ([place]) 11/04/2015  . Syncope and collapse 05/11/11  adrenal insuffiency  treated [facility]  . Thyroid disease   . Vocal cord dysfunction     Past Surgical History:  Procedure Laterality Date  . ABDOMINAL HYSTERECTOMY    . BACK SURGERY     disc   . CARDIAC CATHETERIZATION  2012 at [facility]  . CHOLECYSTECTOMY, LAPAROSCOPIC    . CLOSED REDUCTION METACARPAL WITH PERCUTANEOUS  PINNING Left 03/26/2016   Procedure: CLOSED REDUCTION METACARPAL WITH PERCUTANEOUS PINNING;  Surgeon: Leanora Cover, MD;  Location: [facility];  Service: Orthopedics;  Laterality: Left;  . COLONOSCOPY  2010   [place]: anal papilla, otherwise normal   . ESOPHAGOGASTRODUODENOSCOPY N/A 09/17/2015   Surgicall altered stomach. NO specimens collected. normal esophagus  . eye surg for glaucoma    . gastic by-pass  2008   Gastric by-pass in 2008 at [facility]  . right knee surg      Current Outpatient Prescriptions  Medication Sig Dispense Refill  . albuterol (PROVENTIL HFA;VENTOLIN HFA) 108 (90 BASE) MCG/ACT inhaler Inhale 2 puffs into the lungs every 4 (four) hours as needed for wheezing.     Marland Kitchen aluminum chloride (DRYSOL) 20 % external solution Apply 1 application topically daily.    Marland Kitchen atorvastatin (LIPITOR) 20 MG tablet Take 20 mg by mouth every evening.     . brimonidine (ALPHAGAN) 0.2 % ophthalmic solution Place 1 drop into both eyes 2 (two) times daily.    . Calcium Carb-Ergocalciferol 500-200 MG-UNIT TABS Take 1 tablet by mouth daily.    . Cholecalciferol (VITAMIN D) 2000 UNITS tablet Take 2,000 Units by mouth daily.    . citalopram (CELEXA) 20 MG tablet Take 20 mg by mouth at bedtime.     . clindamycin (CLEOCIN T) 1 % external solution Apply  1 application topically every evening. Applies to underarms    . DULoxetine (CYMBALTA) 20 MG capsule Take 40 mg by mouth daily.    Marland Kitchen EPINEPHrine (EPIPEN JR) 0.15 MG/0.3ML injection Inject 0.15 mg into the muscle as needed.    . fluticasone (FLOVENT HFA) 220 MCG/ACT inhaler Inhale 2 puffs into the lungs every 4 (four) hours.    . hydrochlorothiazide (HYDRODIURIL) 25 MG tablet Take 25 mg by mouth daily.    Marland Kitchen ipratropium (ATROVENT) 0.02 % nebulizer solution Take 2.5 mLs by nebulization 4 (four) times daily as needed for wheezing or shortness of breath.     . latanoprost (XALATAN) 0.005 % ophthalmic solution Place 1 drop into both eyes at  bedtime.    Marland Kitchen levothyroxine (SYNTHROID, LEVOTHROID) 50 MCG tablet Take 50 mcg by mouth daily.    Marland Kitchen lisinopril (PRINIVIL,ZESTRIL) 20 MG tablet Take 20 mg by mouth daily.    Marland Kitchen loratadine (CLARITIN) 10 MG tablet Take 10 mg by mouth daily.  0  . Melatonin 3 MG TABS Take 3 mg by mouth at bedtime.    . montelukast (SINGULAIR) 10 MG tablet Take 10 mg by mouth at bedtime.    . nitroGLYCERIN (NITROSTAT) 0.4 MG SL tablet Place 0.4 mg under the tongue every 5 (five) minutes as needed for chest pain.    Marland Kitchen omalizumab (XOLAIR) 150 MG injection Inject 300 mg into the skin every 30 (thirty) days.    . ondansetron (ZOFRAN ODT) 4 MG disintegrating tablet Take 1 tablet (4 mg total) by mouth every 8 (eight) hours as needed. 10 tablet 0  . potassium chloride SA (K-DUR,KLOR-CON) 20 MEQ tablet Take 1 tablet (20 mEq total) by mouth 2 (two) times daily. Take two tablets three times a day for three days, then reduce to one tablet twice a day 72 tablet 0  . ranitidine (ZANTAC) 150 MG tablet Take 150 mg by mouth 2 (two) times daily.    Marland Kitchen tiotropium (SPIRIVA HANDIHALER) 18 MCG inhalation capsule Place 1 capsule into inhaler and inhale daily.    . VOLTAREN 1 % GEL Apply 1 application topically daily.    . Na Sulfate-K Sulfate-Mg Sulf (SUPREP BOWEL PREP KIT) 17.5-3.13-1.6 GM/177ML SOLN Take 1 kit by mouth as directed. 1 Bottle 0   No current facility-administered medications for this visit.     Allergies as of 02/04/2017 - Review Complete 02/04/2017  Allergen Reaction Noted  . Aspirin Shortness Of Breath and Palpitations 05/11/2011  . Codeine sulfate Shortness Of Breath and Palpitations 05/11/2011  . Darvocet [propoxyphene n-acetaminophen] Shortness Of Breath and Palpitations 05/11/2011  . Nsaids  02/15/2013  . Tramadol Shortness Of Breath 10/30/2014  . Penicillins Other (See Comments) 05/11/2011    Family History  Problem Relation Age of Onset  . Heart attack Mother   . Hypertension Mother   . Heart attack Father    . Asthma Father   . Hyperlipidemia Father   . Hypertension Father   . Heart attack Sister   . Arrhythmia Sister   . Asthma Sister   . Hyperlipidemia Sister   . Hypertension Sister   . Asthma Brother   . Hypertension Brother   . Colon cancer Neg Hx     Social History   Social History  . Marital status: Single    Spouse name: N/A  . Number of children: N/A  . Years of education: N/A   Occupational History  . disability     Social History Main Topics  . Smoking status: Never  Smoker  . Smokeless tobacco: Never Used  . Alcohol use No  . Drug use: No  . Sexual activity: No   Other Topics Concern  . Not on file   Social History Narrative  . No narrative on file    Review of Systems: Gen: Denies any fever, chills, fatigue, weight loss, lack of appetite.  CV: Denies chest pain, heart palpitations, peripheral edema, syncope.  Resp: Denies shortness of breath at rest or with exertion. Denies wheezing or cough.  GI: see HPI  GU : Denies urinary burning, urinary frequency, urinary hesitancy MS: Denies joint pain, muscle weakness, cramps, or limitation of movement.  Derm: Denies rash, itching, dry skin Psych: Denies depression, anxiety, memory loss, and confusion Heme: see HPI   Physical Exam: BP (!) 155/92   Pulse 66   Temp (!) 97.2 F (36.2 C) (Oral)   Ht 5' 2"  (1.575 m)   Wt 243 lb 3.2 oz (110.3 kg)   BMI 44.48 kg/m  General:   Alert and oriented. Pleasant and cooperative. Well-nourished and well-developed.  Head:  Normocephalic and atraumatic. Eyes:  Without icterus, sclera clear and conjunctiva pink.  Ears:  Normal auditory acuity. Nose:  No deformity, discharge,  or lesions. Mouth:  No deformity or lesions, oral mucosa pink.  Lungs:  Clear to auscultation bilaterally. No wheezes, rales, or rhonchi. No distress.  Heart:  S1, S2 present without murmurs appreciated.  Abdomen:  +BS, soft, mild TTP upper abdomen and non-distended. No HSM noted. No guarding or  rebound. No masses appreciated.  Rectal:  Deferred  Msk:  Symmetrical without gross deformities. Normal posture. Extremities:  Without  edema. Neurologic:  Alert and  oriented x4 Psych:  Alert and cooperative. Normal mood and affect.   [facility] labs: Last Hgb 13.3 on Feb 02, 2017. Ferritin down to 17 from 49 (from March to September).

## 2017-02-04 NOTE — Patient Instructions (Signed)
We will be setting you up for a colonoscopy with possible upper endoscopy by Dr. Gala Romney in the near future.  Continue to stay off the Xarelto for now.  We will see you back after the procedures! It was great to see you today!   It was a pleasure to see you today. I strive to create trusting relationships with patients to provide genuine, compassionate, and quality care. I value your feedback. If you receive a survey regarding your visit,  I greatly appreciate you the taking time to fill this out.   Annitta Needs, PhD, ANP-BC [facility]

## 2017-02-04 NOTE — Telephone Encounter (Signed)
Tried to call pt to inform of pre-op appt 03/10/17 at 11:00am. No answer, unable to leave message. Letter mailed.

## 2017-02-04 NOTE — Progress Notes (Signed)
Primary Care Physician:  Sol Passer, MD Primary Gastroenterologist:  Dr. Gala Romney   Chief Complaint  Patient presents with  . Blood In Stools    stopped Xarelto  . Abdominal Pain    HPI:   Deborah Murray is a delightful 56 y.o. female presenting today due to rectal bleeding, referred by cardiology. She has a history of  refractory/recurrent CDI. She completed FMT via capsules in Feb 2018. She has been followed by [facility] and now cleared from their standpoint. She had an excellent response. Last saw Gaspar Cola in May 2018.    Taken off Xarelto about 3 weeks ago. Had black tarry stool. A couple of times had some bright red blood. Continues to see black, tarry stool. Not on iron. No pepto bismol. Last Hgb 13.3 on Feb 02, 2017. Ferritin down to 17 from 49 (from March to September).   Notes upper abdominal discomfort, tender-like. Sometimes pain after eating. Feels nauseated all the time. No dysphagia. Had episode of diarrhea that resolved with Imodium. Has reflux. Takes Prevacid. Takes tylenol. No NSAIDs.  Last colonoscopy 2010 at [facility]: anal papilla, otherwise normal. EGD in June 2017 with normal esophagus, surgically altered stomach, no specimens collected.    Past Medical History:  Diagnosis Date  . Adrenal abnormality ([place])   . Adrenal insufficiency ([place]) 2013   Tx at [facility]  . Asthma   . Atrial fibrillation ([place])   . Chronic back pain   . COPD (chronic obstructive pulmonary disease) ([place])   . Glaucoma   . Headache   . Hyperlipidemia   . Hypertension   . Lumbar radiculopathy   . Sepsis ([place]) 11/04/2015  . Syncope and collapse 05/11/11  adrenal insuffiency  treated at [facility]  . Thyroid disease   . Vocal cord dysfunction     Past Surgical History:  Procedure Laterality Date  . ABDOMINAL HYSTERECTOMY    . BACK SURGERY     disc   . CARDIAC CATHETERIZATION  2012 at [facility]  . CHOLECYSTECTOMY, LAPAROSCOPIC    . CLOSED REDUCTION METACARPAL WITH PERCUTANEOUS  PINNING Left 03/26/2016   Procedure: CLOSED REDUCTION METACARPAL WITH PERCUTANEOUS PINNING;  Surgeon: Leanora Cover, MD;  Location: [place];  Service: Orthopedics;  Laterality: Left;  . COLONOSCOPY  2010   [place]: anal papilla, otherwise normal   . ESOPHAGOGASTRODUODENOSCOPY N/A 09/17/2015   Surgicall altered stomach. NO specimens collected. normal esophagus  . eye surg for glaucoma    . gastic by-pass  2008   Gastric by-pass in 2008 at [facility]  . right knee surg      Current Outpatient Prescriptions  Medication Sig Dispense Refill  . albuterol (PROVENTIL HFA;VENTOLIN HFA) 108 (90 BASE) MCG/ACT inhaler Inhale 2 puffs into the lungs every 4 (four) hours as needed for wheezing.     Marland Kitchen aluminum chloride (DRYSOL) 20 % external solution Apply 1 application topically daily.    Marland Kitchen atorvastatin (LIPITOR) 20 MG tablet Take 20 mg by mouth every evening.     . brimonidine (ALPHAGAN) 0.2 % ophthalmic solution Place 1 drop into both eyes 2 (two) times daily.    . Calcium Carb-Ergocalciferol 500-200 MG-UNIT TABS Take 1 tablet by mouth daily.    . Cholecalciferol (VITAMIN D) 2000 UNITS tablet Take 2,000 Units by mouth daily.    . citalopram (CELEXA) 20 MG tablet Take 20 mg by mouth at bedtime.     . clindamycin (CLEOCIN T) 1 % external solution Apply  1 application topically every evening. Applies to underarms    . DULoxetine (CYMBALTA) 20 MG capsule Take 40 mg by mouth daily.    Marland Kitchen EPINEPHrine (EPIPEN JR) 0.15 MG/0.3ML injection Inject 0.15 mg into the muscle as needed.    . fluticasone (FLOVENT HFA) 220 MCG/ACT inhaler Inhale 2 puffs into the lungs every 4 (four) hours.    . hydrochlorothiazide (HYDRODIURIL) 25 MG tablet Take 25 mg by mouth daily.    Marland Kitchen ipratropium (ATROVENT) 0.02 % nebulizer solution Take 2.5 mLs by nebulization 4 (four) times daily as needed for wheezing or shortness of breath.     . latanoprost (XALATAN) 0.005 % ophthalmic solution Place 1 drop into both eyes at  bedtime.    Marland Kitchen levothyroxine (SYNTHROID, LEVOTHROID) 50 MCG tablet Take 50 mcg by mouth daily.    Marland Kitchen lisinopril (PRINIVIL,ZESTRIL) 20 MG tablet Take 20 mg by mouth daily.    Marland Kitchen loratadine (CLARITIN) 10 MG tablet Take 10 mg by mouth daily.  0  . Melatonin 3 MG TABS Take 3 mg by mouth at bedtime.    . montelukast (SINGULAIR) 10 MG tablet Take 10 mg by mouth at bedtime.    . nitroGLYCERIN (NITROSTAT) 0.4 MG SL tablet Place 0.4 mg under the tongue every 5 (five) minutes as needed for chest pain.    Marland Kitchen omalizumab (XOLAIR) 150 MG injection Inject 300 mg into the skin every 30 (thirty) days.    . ondansetron (ZOFRAN ODT) 4 MG disintegrating tablet Take 1 tablet (4 mg total) by mouth every 8 (eight) hours as needed. 10 tablet 0  . potassium chloride SA (K-DUR,KLOR-CON) 20 MEQ tablet Take 1 tablet (20 mEq total) by mouth 2 (two) times daily. Take two tablets three times a day for three days, then reduce to one tablet twice a day 72 tablet 0  . ranitidine (ZANTAC) 150 MG tablet Take 150 mg by mouth 2 (two) times daily.    Marland Kitchen tiotropium (SPIRIVA HANDIHALER) 18 MCG inhalation capsule Place 1 capsule into inhaler and inhale daily.    . VOLTAREN 1 % GEL Apply 1 application topically daily.    . Na Sulfate-K Sulfate-Mg Sulf (SUPREP BOWEL PREP KIT) 17.5-3.13-1.6 GM/177ML SOLN Take 1 kit by mouth as directed. 1 Bottle 0   No current facility-administered medications for this visit.     Allergies as of 02/04/2017 - Review Complete 02/04/2017  Allergen Reaction Noted  . Aspirin Shortness Of Breath and Palpitations 05/11/2011  . Codeine sulfate Shortness Of Breath and Palpitations 05/11/2011  . Darvocet [propoxyphene n-acetaminophen] Shortness Of Breath and Palpitations 05/11/2011  . Nsaids  02/15/2013  . Tramadol Shortness Of Breath 10/30/2014  . Penicillins Other (See Comments) 05/11/2011    Family History  Problem Relation Age of Onset  . Heart attack Mother   . Hypertension Mother   . Heart attack Father    . Asthma Father   . Hyperlipidemia Father   . Hypertension Father   . Heart attack Sister   . Arrhythmia Sister   . Asthma Sister   . Hyperlipidemia Sister   . Hypertension Sister   . Asthma Brother   . Hypertension Brother   . Colon cancer Neg Hx     Social History   Social History  . Marital status: Single    Spouse name: N/A  . Number of children: N/A  . Years of education: N/A   Occupational History  . disability     Social History Main Topics  . Smoking status: Never  Smoker  . Smokeless tobacco: Never Used  . Alcohol use No  . Drug use: No  . Sexual activity: No   Other Topics Concern  . Not on file   Social History Narrative  . No narrative on file    Review of Systems: Gen: Denies any fever, chills, fatigue, weight loss, lack of appetite.  CV: Denies chest pain, heart palpitations, peripheral edema, syncope.  Resp: Denies shortness of breath at rest or with exertion. Denies wheezing or cough.  GI: see HPI  GU : Denies urinary burning, urinary frequency, urinary hesitancy MS: Denies joint pain, muscle weakness, cramps, or limitation of movement.  Derm: Denies rash, itching, dry skin Psych: Denies depression, anxiety, memory loss, and confusion Heme: see HPI   Physical Exam: BP (!) 155/92   Pulse 66   Temp (!) 97.2 F (36.2 C) (Oral)   Ht 5' 2"  (1.575 m)   Wt 243 lb 3.2 oz (110.3 kg)   BMI 44.48 kg/m  General:   Alert and oriented. Pleasant and cooperative. Well-nourished and well-developed.  Head:  Normocephalic and atraumatic. Eyes:  Without icterus, sclera clear and conjunctiva pink.  Ears:  Normal auditory acuity. Nose:  No deformity, discharge,  or lesions. Mouth:  No deformity or lesions, oral mucosa pink.  Lungs:  Clear to auscultation bilaterally. No wheezes, rales, or rhonchi. No distress.  Heart:  S1, S2 present without murmurs appreciated.  Abdomen:  +BS, soft, mild TTP upper abdomen and non-distended. No HSM noted. No guarding or  rebound. No masses appreciated.  Rectal:  Deferred  Msk:  Symmetrical without gross deformities. Normal posture. Extremities:  Without  edema. Neurologic:  Alert and  oriented x4 Psych:  Alert and cooperative. Normal mood and affect.   [facility] labs: Last Hgb 13.3 on Feb 02, 2017. Ferritin down to 17 from 49 (from March to September).

## 2017-02-10 ENCOUNTER — Telehealth: Payer: Self-pay | Admitting: Gastroenterology

## 2017-02-10 ENCOUNTER — Encounter: Payer: Self-pay | Admitting: Gastroenterology

## 2017-02-10 NOTE — Telephone Encounter (Signed)
If at all possible, can she be moved up? She has Xarelto on hold for now, and I don't think she should be without it for another month (colonoscopy +/- EGD is scheduled in Dec). Needs to be in next 2 weeks if possible?

## 2017-02-10 NOTE — Telephone Encounter (Signed)
Procedure moved to 03/03/17 at 12:15pm (AB is aware). Called and informed Endo scheduler. Called and informed pt. New instructions mailed.   New pre-op appt 02/26/17 at 10:00am. Called pt back and informed her.

## 2017-02-10 NOTE — Assessment & Plan Note (Signed)
56 year old very pleasant female who dealt with refractory/recurrent CDI last year, s/p FMT via oral capsules in Feb 2018 with excellent response. She has been cleared from [facility] and lase seen in May 2018. Now with reports of bright red blood per rectum and black tarry stool in setting of Xarelto, which is now on hold. Hgb recently stable at 13.3 but ferritin has dropped from March of this year to low normal at 17. Denies NSAIDs. Last colonoscopy in 2010 at [facility] normal, and she had an EGD last year (June 2017) with normal esophagus and surgically altered stomach s/p gastric bypass in remote past.   At this point, recommend pursuing colonoscopy +/- EGD. Hemodynamically stable and does not appear to be a significant GI bleed. Unclear if true melena or not, but her recent Hgb is stable. Remains off of Xarelto.   Proceed with TCS+/- EGD with Dr. Gala Romney in near future: the risks, benefits, and alternatives have been discussed with the patient in detail. The patient states understanding and desires to proceed. 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 Will await findings from endoscopic procedures.  Follow-up after procedures.

## 2017-02-11 NOTE — Progress Notes (Signed)
CC'D TO PCP °

## 2017-02-25 NOTE — Patient Instructions (Signed)
Deborah Murray  02/25/2017     @PREFPERIOPPHARMACY @   Your procedure is scheduled on  03/03/2017 .  Report to [facility] at  1010  A.M.  Call this number if you have problems the morning of surgery:  [phone]   Remember:  Do not eat food or drink liquids after midnight.  Take these medicines the morning of surgery with A SIP OF WATER  Amlodipine, cymbalta, levothyroxine, prilosec, zofran, zantac.   Do not wear jewelry, make-up or nail polish.  Do not wear lotions, powders, or perfumes, or deoderant.  Do not shave 48 hours prior to surgery.  Men may shave face and neck.  Do not bring valuables to the hospital.  [facility] is not responsible for any belongings or valuables.  Contacts, dentures or bridgework may not be worn into surgery.  Leave your suitcase in the car.  After surgery it may be brought to your room.  For patients admitted to the hospital, discharge time will be determined by your treatment team.  Patients discharged the day of surgery will not be allowed to drive home.   Name and phone number of your driver:   family Special instructions:  Follow the diet and prep instructions given to you by Dr Roseanne Kaufman office.  Please read over the following fact sheets that you were given. Anesthesia Post-op Instructions and Care and Recovery After Surgery      Esophagogastroduodenoscopy, Care After Refer to this sheet in the next few weeks. These instructions provide you with information about caring for yourself after your procedure. Your health care provider may also give you more specific instructions. Your treatment has been planned according to current medical practices, but problems sometimes occur. Call your health care provider if you have any problems or questions after your procedure. What can I expect after the procedure? After the procedure, it is common to have:  A sore  throat.  Nausea.  Bloating.  Dizziness.  Fatigue.  Follow these instructions at home:  Do not eat or drink anything until the numbing medicine (local anesthetic) has worn off and your gag reflex has returned. You will know that the local anesthetic has worn off when you can swallow comfortably.  Do not drive for 24 hours if you received a medicine to help you relax (sedative).  If your health care provider took a tissue sample for testing during the procedure, make sure to get your test results. This is your responsibility. Ask your health care provider or the department performing the test when your results will be ready.  Keep all follow-up visits as told by your health care provider. This is important. Contact a health care provider if:  You cannot stop coughing.  You are not urinating.  You are urinating less than usual. Get help right away if:  You have trouble swallowing.  You cannot eat or drink.  You have throat or chest pain that gets worse.  You are dizzy or light-headed.  You faint.  You have nausea or vomiting.  You have chills.  You have a fever.  You have severe abdominal pain.  You have black, tarry, or bloody stools. This information is not intended to replace advice given to you by your health care provider. Make sure you discuss any questions you have with your health care provider. Document Released: 03/16/2012 Document Revised: 09/05/2015 Document Reviewed: 02/21/2015 Elsevier Interactive Patient Education  [employer].  Colonoscopy, Adult A colonoscopy is an exam to look at the entire large intestine. During the exam, a lubricated, bendable tube is inserted into the anus and then passed into the rectum, colon, and other parts of the large intestine. A colonoscopy is often done as a part of normal colorectal screening or in response to certain symptoms, such as anemia, persistent diarrhea, abdominal pain, and blood in the stool. The exam  can help screen for and diagnose medical problems, including:  Tumors.  Polyps.  Inflammation.  Areas of bleeding.  Tell a health care provider about:  Any allergies you have.  All medicines you are taking, including vitamins, herbs, eye drops, creams, and over-the-counter medicines.  Any problems you or family members have had with anesthetic medicines.  Any blood disorders you have.  Any surgeries you have had.  Any medical conditions you have.  Any problems you have had passing stool. What are the risks? Generally, this is a safe procedure. However, problems may occur, including:  Bleeding.  A tear in the intestine.  A reaction to medicines given during the exam.  Infection (rare).  What happens before the procedure? Eating and drinking restrictions Follow instructions from your health care provider about eating and drinking, which may include:  A few days before the procedure - follow a low-fiber diet. Avoid nuts, seeds, dried fruit, raw fruits, and vegetables.  1-3 days before the procedure - follow a clear liquid diet. Drink only clear liquids, such as clear broth or bouillon, black coffee or tea, clear juice, clear soft drinks or sports drinks, gelatin dessert, and popsicles. Avoid any liquids that contain red or purple dye.  On the day of the procedure - do not eat or drink anything during the 2 hours before the procedure, or within the time period that your health care provider recommends.  Bowel prep If you were prescribed an oral bowel prep to clean out your colon:  Take it as told by your health care provider. Starting the day before your procedure, you will need to drink a large amount of medicated liquid. The liquid will cause you to have multiple loose stools until your stool is almost clear or light green.  If your skin or anus gets irritated from diarrhea, you may use these to relieve the irritation: ? Medicated wipes, such as adult wet wipes with  aloe and vitamin E. ? A skin soothing-product like petroleum jelly.  If you vomit while drinking the bowel prep, take a break for up to 60 minutes and then begin the bowel prep again. If vomiting continues and you cannot take the bowel prep without vomiting, call your health care provider.  General instructions  Ask your health care provider about changing or stopping your regular medicines. This is especially important if you are taking diabetes medicines or blood thinners.  Plan to have someone take you home from the hospital or clinic. What happens during the procedure?  An IV tube may be inserted into one of your veins.  You will be given medicine to help you relax (sedative).  To reduce your risk of infection: ? Your health care team will wash or sanitize their hands. ? Your anal area will be washed with soap.  You will be asked to lie on your side with your knees bent.  Your health care provider will lubricate a long, thin, flexible tube. The tube will have a camera and a light on the end.  The tube will be inserted into  your anus.  The tube will be gently eased through your rectum and colon.  Air will be delivered into your colon to keep it open. You may feel some pressure or cramping.  The camera will be used to take images during the procedure.  A small tissue sample may be removed from your body to be examined under a microscope (biopsy). If any potential problems are found, the tissue will be sent to a lab for testing.  If small polyps are found, your health care provider may remove them and have them checked for cancer cells.  The tube that was inserted into your anus will be slowly removed. The procedure may vary among health care providers and hospitals. What happens after the procedure?  Your blood pressure, heart rate, breathing rate, and blood oxygen level will be monitored until the medicines you were given have worn off.  Do not drive for 24 hours after  the exam.  You may have a small amount of blood in your stool.  You may pass gas and have mild abdominal cramping or bloating due to the air that was used to inflate your colon during the exam.  It is up to you to get the results of your procedure. Ask your health care provider, or the department performing the procedure, when your results will be ready. This information is not intended to replace advice given to you by your health care provider. Make sure you discuss any questions you have with your health care provider. Document Released: 03/27/2000 Document Revised: 01/29/2016 Document Reviewed: 06/11/2015 Elsevier Interactive Patient Education  2018 [employer].  Colonoscopy, Adult, Care After This sheet gives you information about how to care for yourself after your procedure. Your health care provider may also give you more specific instructions. If you have problems or questions, contact your health care provider. What can I expect after the procedure? After the procedure, it is common to have:  A small amount of blood in your stool for 24 hours after the procedure.  Some gas.  Mild abdominal cramping or bloating.  Follow these instructions at home: General instructions   For the first 24 hours after the procedure: ? Do not drive or use machinery. ? Do not sign important documents. ? Do not drink alcohol. ? Do your regular daily activities at a slower pace than normal. ? Eat soft, easy-to-digest foods. ? Rest often.  Take over-the-counter or prescription medicines only as told by your health care provider.  It is up to you to get the results of your procedure. Ask your health care provider, or the department performing the procedure, when your results will be ready. Relieving cramping and bloating  Try walking around when you have cramps or feel bloated.  Apply heat to your abdomen as told by your health care provider. Use a heat source that your health care provider  recommends, such as a moist heat pack or a heating pad. ? Place a towel between your skin and the heat source. ? Leave the heat on for 20-30 minutes. ? Remove the heat if your skin turns bright red. This is especially important if you are unable to feel pain, heat, or cold. You may have a greater risk of getting burned. Eating and drinking  Drink enough fluid to keep your urine clear or pale yellow.  Resume your normal diet as instructed by your health care provider. Avoid heavy or fried foods that are hard to digest.  Avoid drinking alcohol for as  long as instructed by your health care provider. Contact a health care provider if:  You have blood in your stool 2-3 days after the procedure. Get help right away if:  You have more than a small spotting of blood in your stool.  You pass large blood clots in your stool.  Your abdomen is swollen.  You have nausea or vomiting.  You have a fever.  You have increasing abdominal pain that is not relieved with medicine. This information is not intended to replace advice given to you by your health care provider. Make sure you discuss any questions you have with your health care provider. Document Released: 11/12/2003 Document Revised: 12/23/2015 Document Reviewed: 06/11/2015 Elsevier Interactive Patient Education  2018 [place] Anesthesia is a term that refers to techniques, procedures, and medicines that help a person stay safe and comfortable during a medical procedure. Monitored anesthesia care, or sedation, is one type of anesthesia. Your anesthesia specialist may recommend sedation if you will be having a procedure that does not require you to be unconscious, such as:  Cataract surgery.  A dental procedure.  A biopsy.  A colonoscopy.  During the procedure, you may receive a medicine to help you relax (sedative). There are three levels of sedation:  Mild sedation. At this level, you may feel awake  and relaxed. You will be able to follow directions.  Moderate sedation. At this level, you will be sleepy. You may not remember the procedure.  Deep sedation. At this level, you will be asleep. You will not remember the procedure.  The more medicine you are given, the deeper your level of sedation will be. Depending on how you respond to the procedure, the anesthesia specialist may change your level of sedation or the type of anesthesia to fit your needs. An anesthesia specialist will monitor you closely during the procedure. Let your health care provider know about:  Any allergies you have.  All medicines you are taking, including vitamins, herbs, eye drops, creams, and over-the-counter medicines.  Any use of steroids (by mouth or as a cream).  Any problems you or family members have had with sedatives and anesthetic medicines.  Any blood disorders you have.  Any surgeries you have had.  Any medical conditions you have, such as sleep apnea.  Whether you are pregnant or may be pregnant.  Any use of cigarettes, alcohol, or street drugs. What are the risks? Generally, this is a safe procedure. However, problems may occur, including:  Getting too much medicine (oversedation).  Nausea.  Allergic reaction to medicines.  Trouble breathing. If this happens, a breathing tube may be used to help with breathing. It will be removed when you are awake and breathing on your own.  Heart trouble.  Lung trouble.  Before the procedure Staying hydrated Follow instructions from your health care provider about hydration, which may include:  Up to 2 hours before the procedure - you may continue to drink clear liquids, such as water, clear fruit juice, black coffee, and plain tea.  Eating and drinking restrictions Follow instructions from your health care provider about eating and drinking, which may include:  8 hours before the procedure - stop eating heavy meals or foods such as meat,  fried foods, or fatty foods.  6 hours before the procedure - stop eating light meals or foods, such as toast or cereal.  6 hours before the procedure - stop drinking milk or drinks that contain milk.  2 hours before  the procedure - stop drinking clear liquids.  Medicines Ask your health care provider about:  Changing or stopping your regular medicines. This is especially important if you are taking diabetes medicines or blood thinners.  Taking medicines such as aspirin and ibuprofen. These medicines can thin your blood. Do not take these medicines before your procedure if your health care provider instructs you not to.  Tests and exams  You will have a physical exam.  You may have blood tests done to show: ? How well your kidneys and liver are working. ? How well your blood can clot.  General instructions  Plan to have someone take you home from the hospital or clinic.  If you will be going home right after the procedure, plan to have someone with you for 24 hours.  What happens during the procedure?  Your blood pressure, heart rate, breathing, level of pain and overall condition will be monitored.  An IV tube will be inserted into one of your veins.  Your anesthesia specialist will give you medicines as needed to keep you comfortable during the procedure. This may mean changing the level of sedation.  The procedure will be performed. After the procedure  Your blood pressure, heart rate, breathing rate, and blood oxygen level will be monitored until the medicines you were given have worn off.  Do not drive for 24 hours if you received a sedative.  You may: ? Feel sleepy, clumsy, or nauseous. ? Feel forgetful about what happened after the procedure. ? Have a sore throat if you had a breathing tube during the procedure. ? Vomit. This information is not intended to replace advice given to you by your health care provider. Make sure you discuss any questions you have with  your health care provider. Document Released: 12/24/2004 Document Revised: 09/06/2015 Document Reviewed: 07/21/2015 Elsevier Interactive Patient Education  [employer].

## 2017-02-26 ENCOUNTER — Encounter (HOSPITAL_COMMUNITY)
Admission: RE | Admit: 2017-02-26 | Discharge: 2017-02-26 | Disposition: A | Discharge status: Home / Self Care | Payer: Medicare Other | Source: Ambulatory Visit | Attending: Internal Medicine | Admitting: Internal Medicine

## 2017-02-26 ENCOUNTER — Encounter (HOSPITAL_COMMUNITY): Payer: Self-pay

## 2017-02-26 ENCOUNTER — Other Ambulatory Visit: Payer: Self-pay

## 2017-02-26 DIAGNOSIS — K648 Other hemorrhoids: Secondary | ICD-10-CM | POA: Insufficient documentation

## 2017-02-26 DIAGNOSIS — K621 Rectal polyp: Secondary | ICD-10-CM | POA: Insufficient documentation

## 2017-02-26 DIAGNOSIS — K5790 Diverticulosis of intestine, part unspecified, without perforation or abscess without bleeding: Secondary | ICD-10-CM | POA: Insufficient documentation

## 2017-02-26 DIAGNOSIS — Z01818 Encounter for other preprocedural examination: Secondary | ICD-10-CM | POA: Insufficient documentation

## 2017-02-26 HISTORY — DX: Anemia, unspecified: D64.9

## 2017-02-26 HISTORY — DX: Unspecified osteoarthritis, unspecified site: M19.90

## 2017-02-26 HISTORY — DX: Gastro-esophageal reflux disease without esophagitis: K21.9

## 2017-02-26 HISTORY — DX: Hypothyroidism, unspecified: E03.9

## 2017-02-26 LAB — CBC WITH DIFFERENTIAL/PLATELET
Basophils Absolute: 0.1 10*3/uL (ref 0.0–0.1)
Basophils Relative: 2 %
Eosinophils Absolute: 0.1 10*3/uL (ref 0.0–0.7)
Eosinophils Relative: 1 %
HCT: 41.6 % (ref 36.0–46.0)
Hemoglobin: 13.4 g/dL (ref 12.0–15.0)
LYMPHS PCT: 37 %
Lymphs Abs: 1.6 10*3/uL (ref 0.7–4.0)
MCH: 29.1 pg (ref 26.0–34.0)
MCHC: 32.2 g/dL (ref 30.0–36.0)
MCV: 90.2 fL (ref 78.0–100.0)
Monocytes Absolute: 0.4 10*3/uL (ref 0.1–1.0)
Monocytes Relative: 9 %
NEUTROS ABS: 2.2 10*3/uL (ref 1.7–7.7)
NEUTROS PCT: 51 %
PLATELETS: 281 10*3/uL (ref 150–400)
RBC: 4.61 MIL/uL (ref 3.87–5.11)
RDW: 14.6 % (ref 11.5–15.5)
WBC: 4.3 10*3/uL (ref 4.0–10.5)

## 2017-02-26 LAB — BASIC METABOLIC PANEL
Anion gap: 7 (ref 5–15)
BUN: 20 mg/dL (ref 6–20)
CHLORIDE: 105 mmol/L (ref 101–111)
CO2: 26 mmol/L (ref 22–32)
Calcium: 9 mg/dL (ref 8.9–10.3)
Creatinine, Ser: 1.12 mg/dL — ABNORMAL HIGH (ref 0.44–1.00)
GFR calc Af Amer: >60 mL/min (ref >60)
GFR calc non Af Amer: 53 mL/min — ABNORMAL LOW (ref >60)
Glucose, Bld: 77 mg/dL (ref 65–99)
Potassium: 3.3 mmol/L — ABNORMAL LOW (ref 3.5–5.1)
SODIUM: 138 mmol/L (ref 135–145)

## 2017-03-03 ENCOUNTER — Encounter (HOSPITAL_COMMUNITY): Payer: Self-pay | Admitting: *Deleted

## 2017-03-03 ENCOUNTER — Ambulatory Visit (HOSPITAL_COMMUNITY)
Admission: RE | Admit: 2017-03-03 | Discharge: 2017-03-03 | Disposition: A | Discharge status: Home / Self Care | Payer: Medicare Other | Source: Ambulatory Visit | Attending: Internal Medicine | Admitting: Internal Medicine

## 2017-03-03 ENCOUNTER — Encounter (HOSPITAL_COMMUNITY)
Admission: RE | Disposition: A | Discharge status: Home / Self Care | Payer: Self-pay | Source: Ambulatory Visit | Attending: Internal Medicine

## 2017-03-03 ENCOUNTER — Ambulatory Visit (HOSPITAL_COMMUNITY): Payer: Medicare Other | Admitting: Anesthesiology

## 2017-03-03 DIAGNOSIS — E039 Hypothyroidism, unspecified: Secondary | ICD-10-CM | POA: Insufficient documentation

## 2017-03-03 DIAGNOSIS — K5730 Diverticulosis of large intestine without perforation or abscess without bleeding: Secondary | ICD-10-CM | POA: Diagnosis not present

## 2017-03-03 DIAGNOSIS — Z7901 Long term (current) use of anticoagulants: Secondary | ICD-10-CM | POA: Diagnosis not present

## 2017-03-03 DIAGNOSIS — J449 Chronic obstructive pulmonary disease, unspecified: Secondary | ICD-10-CM | POA: Diagnosis not present

## 2017-03-03 DIAGNOSIS — K625 Hemorrhage of anus and rectum: Secondary | ICD-10-CM

## 2017-03-03 DIAGNOSIS — G4730 Sleep apnea, unspecified: Secondary | ICD-10-CM | POA: Insufficient documentation

## 2017-03-03 DIAGNOSIS — Z9884 Bariatric surgery status: Secondary | ICD-10-CM | POA: Diagnosis not present

## 2017-03-03 DIAGNOSIS — Z79899 Other long term (current) drug therapy: Secondary | ICD-10-CM | POA: Diagnosis not present

## 2017-03-03 DIAGNOSIS — G8929 Other chronic pain: Secondary | ICD-10-CM | POA: Diagnosis not present

## 2017-03-03 DIAGNOSIS — K640 First degree hemorrhoids: Secondary | ICD-10-CM | POA: Insufficient documentation

## 2017-03-03 DIAGNOSIS — Z888 Allergy status to other drugs, medicaments and biological substances status: Secondary | ICD-10-CM | POA: Diagnosis not present

## 2017-03-03 DIAGNOSIS — Z880 Allergy status to penicillin: Secondary | ICD-10-CM | POA: Diagnosis not present

## 2017-03-03 DIAGNOSIS — I4891 Unspecified atrial fibrillation: Secondary | ICD-10-CM | POA: Insufficient documentation

## 2017-03-03 DIAGNOSIS — D128 Benign neoplasm of rectum: Secondary | ICD-10-CM | POA: Diagnosis not present

## 2017-03-03 DIAGNOSIS — K921 Melena: Secondary | ICD-10-CM

## 2017-03-03 DIAGNOSIS — E785 Hyperlipidemia, unspecified: Secondary | ICD-10-CM | POA: Diagnosis not present

## 2017-03-03 DIAGNOSIS — I10 Essential (primary) hypertension: Secondary | ICD-10-CM | POA: Diagnosis not present

## 2017-03-03 HISTORY — PX: ESOPHAGOGASTRODUODENOSCOPY (EGD) WITH PROPOFOL: SHX5813

## 2017-03-03 HISTORY — PX: COLONOSCOPY WITH PROPOFOL: SHX5780

## 2017-03-03 LAB — CBC
HEMATOCRIT: 40.3 % (ref 36.0–46.0)
Hemoglobin: 12.5 g/dL (ref 12.0–15.0)
MCH: 28.3 pg (ref 26.0–34.0)
MCHC: 31 g/dL (ref 30.0–36.0)
MCV: 91.4 fL (ref 78.0–100.0)
PLATELETS: 317 10*3/uL (ref 150–400)
RBC: 4.41 MIL/uL (ref 3.87–5.11)
RDW: 14.3 % (ref 11.5–15.5)
WBC: 4.6 10*3/uL (ref 4.0–10.5)

## 2017-03-03 SURGERY — COLONOSCOPY WITH PROPOFOL
Anesthesia: Monitor Anesthesia Care

## 2017-03-03 MED ORDER — FENTANYL CITRATE (PF) 100 MCG/2ML IJ SOLN
INTRAMUSCULAR | Status: AC
  Filled 2017-03-03: qty 2

## 2017-03-03 MED ORDER — LIDOCAINE VISCOUS 2 % MT SOLN
OROMUCOSAL | Status: AC
  Filled 2017-03-03: qty 15

## 2017-03-03 MED ORDER — PROPOFOL 500 MG/50ML IV EMUL
INTRAVENOUS | Status: DC | PRN
  Administered 2017-03-03: 120 ug/kg/min via INTRAVENOUS
  Administered 2017-03-03: 100 ug/kg/min via INTRAVENOUS

## 2017-03-03 MED ORDER — MIDAZOLAM HCL 2 MG/2ML IJ SOLN
1.0000 mg | INTRAMUSCULAR | Status: AC
  Administered 2017-03-03: 2 mg via INTRAVENOUS

## 2017-03-03 MED ORDER — CHLORHEXIDINE GLUCONATE CLOTH 2 % EX PADS: 6.0000 | MEDICATED_PAD | Freq: Once | CUTANEOUS | Status: DC

## 2017-03-03 MED ORDER — HYDROCORTISONE NA SUCCINATE PF 100 MG IJ SOLR
INTRAMUSCULAR | Status: AC
  Filled 2017-03-03: qty 2

## 2017-03-03 MED ORDER — PROPOFOL 10 MG/ML IV BOLUS
INTRAVENOUS | Status: DC | PRN
  Administered 2017-03-03 (×2): 20 mg via INTRAVENOUS

## 2017-03-03 MED ORDER — MIDAZOLAM HCL 2 MG/2ML IJ SOLN
INTRAMUSCULAR | Status: AC
  Filled 2017-03-03: qty 2

## 2017-03-03 MED ORDER — HYDROCORTISONE NA SUCCINATE PF 100 MG IJ SOLR
50.0000 mg | Freq: Once | INTRAMUSCULAR | Status: AC
  Administered 2017-03-03: 50 mg via INTRAVENOUS

## 2017-03-03 MED ORDER — LACTATED RINGERS IV SOLN
INTRAVENOUS | Status: DC
  Administered 2017-03-03: 1000 mL via INTRAVENOUS

## 2017-03-03 MED ORDER — FENTANYL CITRATE (PF) 100 MCG/2ML IJ SOLN
25.0000 ug | Freq: Once | INTRAMUSCULAR | Status: AC
  Administered 2017-03-03: 25 ug via INTRAVENOUS

## 2017-03-03 MED ORDER — LIDOCAINE VISCOUS 2 % MT SOLN
5.0000 mL | Freq: Two times a day (BID) | OROMUCOSAL | Status: DC
  Administered 2017-03-03: 5 mL via OROMUCOSAL

## 2017-03-03 NOTE — Op Note (Signed)
[facility] Patient Name: Deborah Murray Procedure Date: 03/03/2017 11:11 AM MRN: [id number] Date of Birth: Dec 23, 1960 Attending MD: Norvel Richards , MD CSN: [id number] Age: 56 Admit Type: Outpatient Procedure:                Upper GI endoscopy Indications:              Melena Providers:                Norvel Richards, MD, Hinton Rao, RN, Aram Candela Referring MD:              Medicines:                Propofol per Anesthesia Complications:            No immediate complications. Estimated Blood Loss:     Estimated blood loss: none. Procedure:                Pre-Anesthesia Assessment:                           - Prior to the procedure, a History and Physical                            was performed, and patient medications and                            allergies were reviewed. The patient's tolerance of                            previous anesthesia was also reviewed. The risks                            and benefits of the procedure and the sedation                            options and risks were discussed with the patient.                            All questions were answered, and informed consent                            was obtained. Prior Anticoagulants: The patient has                            taken no previous anticoagulant or antiplatelet                            agents. ASA Grade Assessment: II - A patient with                            mild systemic disease. After reviewing the risks  and benefits, the patient was deemed in                            satisfactory condition to undergo the procedure.                           After obtaining informed consent, the endoscope was                            passed under direct vision. Throughout the                            procedure, the patient's blood pressure, pulse, and                            oxygen saturations were monitored  continuously. The                            [id number] ([id number]) scope was introduced through the                            and advanced to the efferent jejunal loop. The                            upper GI endoscopy was accomplished without                            difficulty. Scope In: 11:59:22 AM Scope Out: 12:01:44 PM Total Procedure Duration: 0 hours 2 minutes 22 seconds  Findings:      The examined esophagus was normal.      The stomach surgically altered. Single effernet limb. Residual gastic       qand small bowel mucosa appeared normal. Impression:               - Normal esophagus.                           - Status post prior Hemigastrectomy.                            Normal-appearing residual upper GI tract- No                            specimens collected. Moderate Sedation:      Moderate (conscious) sedation was personally administered by an       anesthesia professional. The following parameters were monitored: oxygen       saturation, heart rate, blood pressure, respiratory rate, EKG, adequacy       of pulmonary ventilation, and response to care. Total physician       intraservice time was 24 minutes. Recommendation:           - Patient has a contact number available for                            emergencies. The signs and symptoms of potential  delayed complications were discussed with the                            patient. Return to normal activities tomorrow.                            Written discharge instructions were provided to the                            patient.                           - Advance diet as tolerated.                           - Continue present medications. CBC today. See                            colonoscopy report. Procedure Code(s):        --- Professional ---                           [phone], Esophagogastroduodenoscopy, flexible,                            transoral; diagnostic, including collection of                             specimen(s) by brushing or washing, when performed                            (separate procedure) Diagnosis Code(s):        --- Professional ---                           K92.1, Melena (includes Hematochezia) CPT copyright 2016 American Medical Association. All rights reserved. The codes documented in this report are preliminary and upon coder review may  be revised to meet current compliance requirements. Cristopher Estimable. Arionna Hoggard, MD Norvel Richards, MD 03/03/2017 12:07:58 PM This report has been signed electronically. Number of Addenda: 0

## 2017-03-03 NOTE — Anesthesia Postprocedure Evaluation (Signed)
Anesthesia Post Note  Patient: Deborah Murray  Procedure(s) Performed: COLONOSCOPY WITH PROPOFOL (N/A ) ESOPHAGOGASTRODUODENOSCOPY (EGD) WITH PROPOFOL (N/A )  Patient location during evaluation: Short Stay Anesthesia Type: MAC Level of consciousness: awake and alert Pain management: satisfactory to patient Vital Signs Assessment: post-procedure vital signs reviewed and stable Respiratory status: spontaneous breathing Cardiovascular status: stable Postop Assessment: no apparent nausea or vomiting Anesthetic complications: no     Last Vitals:  Vitals:   03/03/17 1244 03/03/17 1253  BP: 122/74 (!) 152/93  Pulse: 63 67  Resp: 15   Temp:  36.7 C  SpO2: 99% 97%    Last Pain:  Vitals:   03/03/17 1253  TempSrc: Oral  PainSc: 0-No pain                 Mikkel Charrette

## 2017-03-03 NOTE — Anesthesia Preprocedure Evaluation (Signed)
Anesthesia Evaluation  Patient identified by MRN, date of birth, ID band Patient awake  General Assessment Comment:past medical history of asthma, COPD, adrenal insufficiency, atrial fibrillation, chronic back pain  Reviewed: Allergy & Precautions, NPO status , Patient's Chart, lab work & pertinent test results  Airway Mallampati: II  TM Distance: >3 FB Neck ROM: Full   Comment: Hx Vocal cord dysfunction Dental  (+) Teeth Intact, Dental Advisory Given   Pulmonary asthma , sleep apnea and Continuous Positive Airway Pressure Ventilation , COPD,  COPD inhaler,    Pulmonary exam normal breath sounds clear to auscultation       Cardiovascular hypertension, Pt. on medications (-) angina(-) CAD, (-) Past MI and (-) CHF + dysrhythmias Atrial Fibrillation  Rhythm:Irregular Rate:Normal     Neuro/Psych  Headaches,  Neuromuscular disease    GI/Hepatic Neg liver ROS, PUD, GERD  Medicated,S/p gastric bypass   Endo/Other  Hypothyroidism Morbid obesityAdrenal insufficiency  Renal/GU negative Renal ROS     Musculoskeletal negative musculoskeletal ROS (+)   Abdominal   Peds  Hematology  (+) Blood dyscrasia (Xarelto), anemia ,   Anesthesia Other Findings   Reproductive/Obstetrics                             Anesthesia Physical Anesthesia Plan  ASA: III  Anesthesia Plan: MAC   Post-op Pain Management:    Induction: Intravenous  PONV Risk Score and Plan:   Airway Management Planned: Simple Face Mask  Additional Equipment:   Intra-op Plan:   Post-operative Plan:   Informed Consent: I have reviewed the patients History and Physical, chart, labs and discussed the procedure including the risks, benefits and alternatives for the proposed anesthesia with the patient or authorized representative who has indicated his/her understanding and acceptance.     Plan Discussed with:   Anesthesia Plan  Comments:         Anesthesia Quick Evaluation

## 2017-03-03 NOTE — Discharge Instructions (Addendum)
°Colonoscopy °Discharge Instructions ° °Read the instructions outlined below and refer to this sheet in the next few weeks. These discharge instructions provide you with general information on caring for yourself after you leave the hospital. Your doctor may also give you specific instructions. While your treatment has been planned according to the most current medical practices available, unavoidable complications occasionally occur. If you have any problems or questions after discharge, call Dr. Rourk at [phone]. °ACTIVITY °· You may resume your regular activity, but move at a slower pace for the next 24 hours.  °· Take frequent rest periods for the next 24 hours.  °· Walking will help get rid of the air and reduce the bloated feeling in your belly (abdomen).  °· No driving for 24 hours (because of the medicine (anesthesia) used during the test).   °· Do not sign any important legal documents or operate any machinery for 24 hours (because of the anesthesia used during the test).  °NUTRITION °· Drink plenty of fluids.  °· You may resume your normal diet as instructed by your doctor.  °· Begin with a light meal and progress to your normal diet. Heavy or fried foods are harder to digest and may make you feel sick to your stomach (nauseated).  °· Avoid alcoholic beverages for 24 hours or as instructed.  °MEDICATIONS °· You may resume your normal medications unless your doctor tells you otherwise.  °WHAT YOU CAN EXPECT TODAY °· Some feelings of bloating in the abdomen.  °· Passage of more gas than usual.  °· Spotting of blood in your stool or on the toilet paper.  °IF YOU HAD POLYPS REMOVED DURING THE COLONOSCOPY: °· No aspirin products for 7 days or as instructed.  °· No alcohol for 7 days or as instructed.  °· Eat a soft diet for the next 24 hours.  °FINDING OUT THE RESULTS OF YOUR TEST °Not all test results are available during your visit. If your test results are not back during the visit, make an appointment  with your caregiver to find out the results. Do not assume everything is normal if you have not heard from your caregiver or the medical facility. It is important for you to follow up on all of your test results.  °SEEK IMMEDIATE MEDICAL ATTENTION IF: °· You have more than a spotting of blood in your stool.  °· Your belly is swollen (abdominal distention).  °· You are nauseated or vomiting.  °· You have a temperature over 101.  °· You have abdominal pain or discomfort that is severe or gets worse throughout the day.  °EGD °Discharge instructions °Please read the instructions outlined below and refer to this sheet in the next few weeks. These discharge instructions provide you with general information on caring for yourself after you leave the hospital. Your doctor may also give you specific instructions. While your treatment has been planned according to the most current medical practices available, unavoidable complications occasionally occur. If you have any problems or questions after discharge, please call your doctor. °ACTIVITY °· You may resume your regular activity but move at a slower pace for the next 24 hours.  °· Take frequent rest periods for the next 24 hours.  °· Walking will help expel (get rid of) the air and reduce the bloated feeling in your abdomen.  °· No driving for 24 hours (because of the anesthesia (medicine) used during the test).  °· You may shower.  °· Do not sign any important   legal documents or operate any machinery for 24 hours (because of the anesthesia used during the test).  NUTRITION  Drink plenty of fluids.   You may resume your normal diet.   Begin with a light meal and progress to your normal diet.   Avoid alcoholic beverages for 24 hours or as instructed by your caregiver.  MEDICATIONS  You may resume your normal medications unless your caregiver tells you otherwise.  WHAT YOU CAN EXPECT TODAY  You may experience abdominal discomfort such as a feeling of fullness  or gas pains.  FOLLOW-UP  Your doctor will discuss the results of your test with you.  SEEK IMMEDIATE MEDICAL ATTENTION IF ANY OF THE FOLLOWING OCCUR:  Excessive nausea (feeling sick to your stomach) and/or vomiting.   Severe abdominal pain and distention (swelling).   Trouble swallowing.   Temperature over 101 F (37.8 C).   Rectal bleeding or vomiting of blood.    Colon polyp and diverticulosis information provided  Further recommendations to follow pending review of pathology report  CBC today  Colon Polyps Polyps are tissue growths inside the body. Polyps can grow in many places, including the large intestine (colon). A polyp may be a round bump or a mushroom-shaped growth. You could have one polyp or several. Most colon polyps are noncancerous (benign). However, some colon polyps can become cancerous over time. What are the causes? The exact cause of colon polyps is not known. What increases the risk? This condition is more likely to develop in people who:  Have a family history of colon cancer or colon polyps.  Are older than 63 or older than 45 if they are African American.  Have inflammatory bowel disease, such as ulcerative colitis or Crohn disease.  Are overweight.  Smoke cigarettes.  Do not get enough exercise.  Drink too much alcohol.  Eat a diet that is: ? High in fat and red meat. ? Low in fiber.  Had childhood cancer that was treated with abdominal radiation.  What are the signs or symptoms? Most polyps do not cause symptoms. If you have symptoms, they may include:  Blood coming from your rectum when having a bowel movement.  Blood in your stool.The stool may look dark red or black.  A change in bowel habits, such as constipation or diarrhea.  How is this diagnosed? This condition is diagnosed with a colonoscopy. This is a procedure that uses a lighted, flexible scope to look at the inside of your colon. How is this treated? Treatment  for this condition involves removing any polyps that are found. Those polyps will then be tested for cancer. If cancer is found, your health care provider will talk to you about options for colon cancer treatment. Follow these instructions at home: Diet  Eat plenty of fiber, such as fruits, vegetables, and whole grains.  Eat foods that are high in calcium and vitamin D, such as milk, cheese, yogurt, eggs, liver, fish, and broccoli.  Limit foods high in fat, red meats, and processed meats, such as hot dogs, sausage, bacon, and lunch meats.  Maintain a healthy weight, or lose weight if recommended by your health care provider. General instructions  Do not smoke cigarettes.  Do not drink alcohol excessively.  Keep all follow-up visits as told by your health care provider. This is important. This includes keeping regularly scheduled colonoscopies. Talk to your health care provider about when you need a colonoscopy.  Exercise every day or as told by your health care  provider. Contact a health care provider if:  You have new or worsening bleeding during a bowel movement.  You have new or increased blood in your stool.  You have a change in bowel habits.  You unexpectedly lose weight. This information is not intended to replace advice given to you by your health care provider. Make sure you discuss any questions you have with your health care provider. Document Released: 12/25/2003 Document Revised: 09/05/2015 Document Reviewed: 02/18/2015 Elsevier Interactive Patient Education  [employer].    Diverticulosis Diverticulosis is a condition that develops when small pouches (diverticula) form in the wall of the large intestine (colon). The colon is where water is absorbed and stool is formed. The pouches form when the inside layer of the colon pushes through weak spots in the outer layers of the colon. You may have a few pouches or many of them. What are the causes? The cause of  this condition is not known. What increases the risk? The following factors may make you more likely to develop this condition: Being older than age 52. Your risk for this condition increases with age. Diverticulosis is rare among people younger than age 14. By age 53, many people have it. Eating a low-fiber diet. Having frequent constipation. Being overweight. Not getting enough exercise. Smoking. Taking over-the-counter pain medicines, like aspirin and ibuprofen. Having a family history of diverticulosis.  What are the signs or symptoms? In most people, there are no symptoms of this condition. If you do have symptoms, they may include: Bloating. Cramps in the abdomen. Constipation or diarrhea. Pain in the lower left side of the abdomen.  How is this diagnosed? This condition is most often diagnosed during an exam for other colon problems. Because diverticulosis usually has no symptoms, it often cannot be diagnosed independently. This condition may be diagnosed by: Using a flexible scope to examine the colon (colonoscopy). Taking an X-ray of the colon after dye has been put into the colon (barium enema). Doing a CT scan.  How is this treated? You may not need treatment for this condition if you have never developed an infection related to diverticulosis. If you have had an infection before, treatment may include: Eating a high-fiber diet. This may include eating more fruits, vegetables, and grains. Taking a fiber supplement. Taking a live bacteria supplement (probiotic). Taking medicine to relax your colon. Taking antibiotic medicines.  Follow these instructions at home: Drink 6-8 glasses of water or more each day to prevent constipation. Try not to strain when you have a bowel movement. If you have had an infection before: Eat more fiber as directed by your health care provider or your diet and nutrition specialist (dietitian). Take a fiber supplement or probiotic, if your  health care provider approves. Take over-the-counter and prescription medicines only as told by your health care provider. If you were prescribed an antibiotic, take it as told by your health care provider. Do not stop taking the antibiotic even if you start to feel better. Keep all follow-up visits as told by your health care provider. This is important. Contact a health care provider if: You have pain in your abdomen. You have bloating. You have cramps. You have not had a bowel movement in 3 days. Get help right away if: Your pain gets worse. Your bloating becomes very bad. You have a fever or chills, and your symptoms suddenly get worse. You vomit. You have bowel movements that are bloody or black. You have bleeding from your  rectum. Summary Diverticulosis is a condition that develops when small pouches (diverticula) form in the wall of the large intestine (colon). You may have a few pouches or many of them. This condition is most often diagnosed during an exam for other colon problems. If you have had an infection related to diverticulosis, treatment may include increasing the fiber in your diet, taking supplements, or taking medicines. This information is not intended to replace advice given to you by your health care provider. Make sure you discuss any questions you have with your health care provider. Document Released: 12/26/2003 Document Revised: 02/17/2016 Document Reviewed: 02/17/2016 Elsevier Interactive Patient Education  2017 [employer].

## 2017-03-03 NOTE — Anesthesia Procedure Notes (Signed)
Procedure Name: MAC Date/Time: 03/03/2017 11:35 AM Performed by: Vista Deck, CRNA Pre-anesthesia Checklist: Patient identified, Emergency Drugs available, Suction available, Timeout performed and Patient being monitored Patient Re-evaluated:Patient Re-evaluated prior to induction Oxygen Delivery Method: Non-rebreather mask

## 2017-03-03 NOTE — Op Note (Signed)
[facility] Patient Name: Deborah Murray Procedure Date: 03/03/2017 11:07 AM MRN: [id number] Date of Birth: 1961/03/03 Attending MD: Norvel Richards , MD CSN: [id number] Age: 56 Admit Type: Outpatient Procedure:                Colonoscopy Indications:              Hematochezia Providers:                Norvel Richards, MD, Hinton Rao, RN, Aram Candela Referring MD:             Sol Passer Medicines:                Propofol per Anesthesia Complications:            No immediate complications. Estimated Blood Loss:     Estimated blood loss was minimal. Procedure:                Pre-Anesthesia Assessment:                           - Prior to the procedure, a History and Physical                            was performed, and patient medications and                            allergies were reviewed. The patient's tolerance of                            previous anesthesia was also reviewed. The risks                            and benefits of the procedure and the sedation                            options and risks were discussed with the patient.                            All questions were answered, and informed consent                            was obtained. Prior Anticoagulants: The patient has                            taken no previous anticoagulant or antiplatelet                            agents. ASA Grade Assessment: II - A patient with                            mild systemic disease. After reviewing the risks  and benefits, the patient was deemed in                            satisfactory condition to undergo the procedure.                           After obtaining informed consent, the colonoscope                            was passed under direct vision. Throughout the                            procedure, the patient's blood pressure, pulse, and                            oxygen saturations  were monitored continuously. The                            EC-[id number] ([id number]) scope was introduced through                            the anus and advanced to the the cecum, identified                            by appendiceal orifice and ileocecal valve. The                            colonoscopy was performed without difficulty. The                            patient tolerated the procedure well. The quality                            of the bowel preparation was adequate. The                            ileocecal valve, appendiceal orifice, and rectum                            were photographed. Scope In: 11:42:49 AM Scope Out: 11:55:28 AM Scope Withdrawal Time: 0 hours 7 minutes 53 seconds  Total Procedure Duration: 0 hours 12 minutes 39 seconds  Findings:      The perianal and digital rectal examinations were normal.      Scattered small-mouthed diverticula were found in the sigmoid colon and       descending colon.      A 4 mm polyp was found in the rectum. The polyp was sessile. The polyp       was removed with a cold snare. Resection and retrieval were complete.      Non-bleeding internal hemorrhoids were found during retroflexion. The       hemorrhoids were moderate, medium-sized and Grade I (internal       hemorrhoids that do not prolapse).      The exam was otherwise without abnormality on direct and retroflexion  views. Impression:               - Diverticulosis in the sigmoid colon and in the                            descending colon.                           - One 4 mm polyp in the rectum, removed with a cold                            snare. Resected and retrieved.                           - Non-bleeding internal hemorrhoids.                           - The examination was otherwise normal on direct                            and retroflexion views. Moderate Sedation:      Moderate (conscious) sedation was personally administered by an       anesthesia  professional. The following parameters were monitored: oxygen       saturation, heart rate, blood pressure, respiratory rate, EKG, adequacy       of pulmonary ventilation, and response to care. Total physician       intraservice time was 18 minutes. Recommendation:           - Patient has a contact number available for                            emergencies. The signs and symptoms of potential                            delayed complications were discussed with the                            patient. Return to normal activities tomorrow.                            Written discharge instructions were provided to the                            patient.                           - Resume previous diet.                           - Continue present medications.                           - Repeat colonoscopy date to be determined after                            pending pathology results are reviewed for  surveillance.                           - Return to GI clinic (date not yet determined).                            See EGD report. Procedure Code(s):        --- Professional ---                           [phone], Colonoscopy, flexible; with removal of                            tumor(s), polyp(s), or other lesion(s) by snare                            technique Diagnosis Code(s):        --- Professional ---                           K64.0, First degree hemorrhoids                           K62.1, Rectal polyp                           K92.1, Melena (includes Hematochezia)                           K57.30, Diverticulosis of large intestine without                            perforation or abscess without bleeding CPT copyright 2016 American Medical Association. All rights reserved. The codes documented in this report are preliminary and upon coder review may  be revised to meet current compliance requirements. Cristopher Estimable. Lyvonne Cassell, MD Norvel Richards, MD 03/03/2017  12:02:56 PM This report has been signed electronically. Number of Addenda: 0

## 2017-03-03 NOTE — Transfer of Care (Signed)
Immediate Anesthesia Transfer of Care Note  Patient: Deborah Murray  Procedure(s) Performed: COLONOSCOPY WITH PROPOFOL (N/A ) ESOPHAGOGASTRODUODENOSCOPY (EGD) WITH PROPOFOL (N/A )  Patient Location: PACU  Anesthesia Type:MAC  Level of Consciousness: awake, alert  and patient cooperative  Airway & Oxygen Therapy: Patient Spontanous Breathing  Post-op Assessment: Report given to RN and Post -op Vital signs reviewed and stable  Post vital signs: Reviewed and stable  Last Vitals:  Vitals:   03/03/17 1130 03/03/17 1135  BP: 126/85 126/85  Pulse:    Resp: 16 18  Temp:    SpO2: 98% 95%    Last Pain:  Vitals:   03/03/17 0949  TempSrc: Oral  PainSc: 3       Patients Stated Pain Goal: 10 (88/28/00 3491)  Complications: No apparent anesthesia complications

## 2017-03-03 NOTE — Interval H&P Note (Signed)
History and Physical Interval Note:  03/03/2017 11:33 AM  Deborah Murray  has presented today for surgery, with the diagnosis of rectal bleeding  The various methods of treatment have been discussed with the patient and family. After consideration of risks, benefits and other options for treatment, the patient has consented to  Procedure(s) with comments: COLONOSCOPY WITH PROPOFOL (N/A) - 1:00pm ESOPHAGOGASTRODUODENOSCOPY (EGD) WITH PROPOFOL (N/A) as a surgical intervention .  The patient's history has been reviewed, patient examined, no change in status, stable for surgery.  I have reviewed the patient's chart and labs.  Questions were answered to the patient's satisfaction.     Reyne Falconi Bleeding has ceased. Colonoscopy followed by possible EGD per plan.  The risks, benefits, limitations, imponderables and alternatives regarding both EGD and colonoscopy have been reviewed with the patient. Questions have been answered. All parties agreeable.

## 2017-03-08 ENCOUNTER — Telehealth: Payer: Self-pay | Admitting: Orthopedic Surgery

## 2017-03-08 ENCOUNTER — Encounter (HOSPITAL_COMMUNITY): Payer: Self-pay | Admitting: Internal Medicine

## 2017-03-08 NOTE — Telephone Encounter (Signed)
Patient called to inquire about appointment as soon as possible; states knows she needs a total knee replacement - has been treating with orthopaedist in [place].  Relayed protocol for second opinion evaluation including notes, reports, films for review.  Patient may proceed; will let us know if decides.

## 2017-03-10 ENCOUNTER — Other Ambulatory Visit (HOSPITAL_COMMUNITY): Payer: Medicare Other

## 2017-03-14 ENCOUNTER — Encounter: Payer: Self-pay | Admitting: Internal Medicine

## 2017-03-25 ENCOUNTER — Ambulatory Visit: Payer: Medicare Other | Admitting: Gastroenterology

## 2017-05-20 ENCOUNTER — Telehealth (HOSPITAL_COMMUNITY): Payer: Self-pay | Admitting: Physical Therapy

## 2017-05-20 NOTE — Telephone Encounter (Signed)
Fall risk, unsteady gait; knee OA, per Sol Passer, MD order scanned in epic. L/m to schedule - paper work wall bin.

## 2017-05-21 ENCOUNTER — Encounter: Payer: Self-pay | Admitting: Gastroenterology

## 2017-05-21 ENCOUNTER — Encounter: Payer: Self-pay | Admitting: *Deleted

## 2017-05-21 ENCOUNTER — Telehealth: Payer: Self-pay | Admitting: *Deleted

## 2017-05-21 ENCOUNTER — Ambulatory Visit (INDEPENDENT_AMBULATORY_CARE_PROVIDER_SITE_OTHER): Payer: Medicare Other | Admitting: Gastroenterology

## 2017-05-21 VITALS — BP 131/91 | HR 72 | Temp 97.0°F | Ht 62.0 in | Wt 248.6 lb

## 2017-05-21 DIAGNOSIS — R1010 Upper abdominal pain, unspecified: Secondary | ICD-10-CM

## 2017-05-21 DIAGNOSIS — R110 Nausea: Secondary | ICD-10-CM

## 2017-05-21 LAB — BASIC METABOLIC PANEL WITH GFR
BUN: 16 mg/dL (ref 7–25)
CO2: 28 mmol/L (ref 20–32)
Calcium: 9 mg/dL (ref 8.6–10.4)
Chloride: 109 mmol/L (ref 98–110)
Creat: 1.01 mg/dL (ref 0.50–1.05)
GFR, EST NON AFRICAN AMERICAN: 61 mL/min/{1.73_m2} (ref >=60)
GFR, Est African American: 71 mL/min/{1.73_m2} (ref >=60)
Glucose, Bld: 78 mg/dL (ref 65–99)
POTASSIUM: 4.2 mmol/L (ref 3.5–5.3)
Sodium: 143 mmol/L (ref 135–146)

## 2017-05-21 MED ORDER — ONDANSETRON HCL 4 MG PO TABS
4.0000 mg | ORAL_TABLET | Freq: Three times a day (TID) | ORAL | 1 refills | Status: DC
Start: 2017-05-21 — End: 2017-07-23

## 2017-05-21 NOTE — Telephone Encounter (Signed)
Pt called back and is aware of appt details. Letter also mailed.

## 2017-05-21 NOTE — Patient Instructions (Addendum)
I have sent in zofran to take 30 minutes before meals for nausea.   I have ordered a CT scan to further check on things. You will need to do blood work a few days before (to check your kidneys) so they know what dosage of dye to give you. You could do the blood work today actually.   I will see you in 8 weeks! You can restart the Xarelto. Let me know if you see any black stool.

## 2017-05-21 NOTE — Assessment & Plan Note (Addendum)
Chronic nausea, EGD on file, no alarm symptoms. She has had no weight loss. Intermittent epigastric pain also noted. As EGD was unrevealing, will pursue CT abd/pelvis as she does have history significant for bariatric procedure. In interim, add zofran before meals and continue PPI. Return for close follow-up in 8 weeks.   Prior reports of rectal bleeding and possible melena evaluated via EGD/colonoscopy. I wonder if she ever had true melena, as Hgb did not drop. She did have a low normal ferritin, which will need to be followed. Capsule study would not be the most helpful due to post-op anatomy. Follow clinically for now. Doubt any small bowel etiology at this point. Low-volume hematochezia secondary to hemorrhoids. Ok from a GI standpoint to resume Xarelto.

## 2017-05-21 NOTE — Progress Notes (Signed)
Referring Provider: Sol Passer, MD Primary Care Physician:  Sol Passer, MD Primary GI: Dr. Gala Romney   Chief Complaint  Patient presents with  . Nausea    all the time but no vomiting. Going on x 1 year    HPI:   Deborah Murray is a 57 y.o. 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 She was last seen in Oct 2018 with reports of hematochezia and melena in setting of Xarelto. Ferritin low normal. Hgb normal.   Colonoscopy Nov 2018 with sigmoid diverticula, 4 mm polyp in rectum (tubular adenoma), non-bleeding internal hemorrhoids, surveillance in 7 years. EGD with normal esophagus, s/p hemigastrectomy.   In Oct 2018, had noted upper abdominal discomfort, pain after eating, chronic nausea. Sometimes pain after eating. Feels nauseated after eating still. No weight loss. Nauseated about 5 minutes after eating. Occasional constipation. Will use a laxative as needed. Not on Xarelto. Needs to restart.   Past Medical History:  Diagnosis Date  . Adrenal abnormality ([place])   . Adrenal insufficiency ([place]) 2013   Tx at [facility]  . Anemia   . Arthritis   . Asthma   . Atrial fibrillation ([place])   . Chronic back pain   . COPD (chronic obstructive pulmonary disease) ([place])   . GERD (gastroesophageal reflux disease)   . Glaucoma   . Headache   . Hyperlipidemia   . Hypertension   . Hypothyroidism   . Lumbar radiculopathy   . Sepsis ([place]) 11/04/2015  . Syncope and collapse 05/11/11  adrenal insuffiency  treated [facility]  . Thyroid disease   . Vocal cord dysfunction     Past Surgical History:  Procedure Laterality Date  . ABDOMINAL HYSTERECTOMY    . BACK SURGERY     disc   . CARDIAC CATHETERIZATION  2012 at [facility]  . CHOLECYSTECTOMY    . CHOLECYSTECTOMY, LAPAROSCOPIC    . CLOSED REDUCTION METACARPAL WITH PERCUTANEOUS PINNING Left 03/26/2016   Procedure: CLOSED REDUCTION METACARPAL  WITH PERCUTANEOUS PINNING;  Surgeon: Leanora Cover, MD;  Location: [facility];  Service: Orthopedics;  Laterality: Left;  . COLONOSCOPY  2010   [place]: anal papilla, otherwise normal   . COLONOSCOPY WITH PROPOFOL N/A 03/03/2017   sigmoid divertivcula, 4 mm polyp in rectum (tubular adenoma), non-bleeding internal hemorrhoids, surveillance in 7 years  . ESOPHAGOGASTRODUODENOSCOPY N/A 09/17/2015   Surgicall altered stomach. NO specimens collected. normal esophagus  . ESOPHAGOGASTRODUODENOSCOPY (EGD) WITH PROPOFOL N/A 03/03/2017   normal esophagus  . eye surg for glaucoma    . gastic by-pass  2008   [facility]pass in 2008 at [facility]  . right knee surg     arthroscopy    Current Outpatient Medications  Medication Sig Dispense Refill  . acetaminophen (TYLENOL) 500 MG tablet Take 1,000 mg every 6 (six) hours as needed by mouth (for pain.).    Marland Kitchen albuterol (PROVENTIL HFA;VENTOLIN HFA) 108 (90 BASE) MCG/ACT inhaler Inhale 2 puffs into the lungs every 4 (four) hours as needed for wheezing.     Marland Kitchen aluminum chloride (DRYSOL) 20 % external solution Apply 1 application 2 (two) times daily as needed topically (for sweating.).     Marland Kitchen amLODipine (NORVASC) 5 MG tablet Take 10 mg by mouth daily.     Marland Kitchen atorvastatin (LIPITOR) 20 MG tablet Take 20 mg by mouth every evening.     . brimonidine (  ALPHAGAN) 0.2 % ophthalmic solution Place 1 drop into both eyes 2 (two) times daily.    . Calcium Carb-Ergocalciferol 500-200 MG-UNIT TABS Take 1 tablet by mouth daily.    . clindamycin (CLEOCIN T) 1 % external solution Apply 1 application at bedtime topically. Applies to underarms for sweating.    . DULoxetine (CYMBALTA) 20 MG capsule Take 40 mg by mouth daily.    Marland Kitchen EPINEPHrine (EPIPEN 2-PAK) 0.3 mg/0.3 mL IJ SOAJ injection Inject 0.3 mg daily as needed into the muscle (for anaphylatic reaction.).    Marland Kitchen fluticasone (FLONASE) 50 MCG/ACT nasal spray Place 1 spray daily into both nostrils.    .  hydrocortisone (CORTEF) 20 MG tablet Take 20 mg 2 (two) times daily by mouth. Morning & Lunch    . latanoprost (XALATAN) 0.005 % ophthalmic solution Place 1 drop into both eyes at bedtime.    Marland Kitchen levocetirizine (XYZAL) 5 MG tablet Take 5 mg every evening by mouth.    . levothyroxine (SYNTHROID, LEVOTHROID) 75 MCG tablet Take 75 mcg daily before breakfast by mouth.    . Melatonin 5 MG TABS Take by mouth.    . montelukast (SINGULAIR) 10 MG tablet Take 10 mg by mouth at bedtime.    . nitroGLYCERIN (NITROSTAT) 0.4 MG SL tablet Place 0.4 mg under the tongue every 5 (five) minutes as needed for chest pain.    Marland Kitchen omalizumab (XOLAIR) 150 MG injection Inject 300 mg into the skin every 30 (thirty) days.    Marland Kitchen omeprazole (PRILOSEC) 40 MG capsule Take 40 mg 2 (two) times daily by mouth.    . ondansetron (ZOFRAN ODT) 4 MG disintegrating tablet Take 1 tablet (4 mg total) by mouth every 8 (eight) hours as needed. 10 tablet 0  . potassium chloride SA (K-DUR,KLOR-CON) 20 MEQ tablet Take 1 tablet (20 mEq total) by mouth 2 (two) times daily. Take two tablets three times a day for three days, then reduce to one tablet twice a day (Patient taking differently: Take 20 mEq daily by mouth. ) 72 tablet 0  . ranitidine (ZANTAC) 150 MG tablet Take 150 mg by mouth 2 (two) times daily.    . Teriparatide, Recombinant, (FORTEO) 600 MCG/2.4ML SOLN Inject 20 mcg at bedtime into the skin.    Marland Kitchen tiotropium (SPIRIVA HANDIHALER) 18 MCG inhalation capsule Place 1 capsule into inhaler and inhale daily.    . VOLTAREN 1 % GEL Apply 1 application 4 (four) times daily topically.     . ondansetron (ZOFRAN) 4 MG tablet Take 1 tablet (4 mg total) by mouth 3 (three) times daily before meals. 120 tablet 1   No current facility-administered medications for this visit.     Allergies as of 05/21/2017 - Review Complete 05/21/2017  Allergen Reaction Noted  . Aspirin Shortness Of Breath and Palpitations 05/11/2011  . Codeine sulfate Shortness Of  Breath and Palpitations 05/11/2011  . Darvocet [propoxyphene n-acetaminophen] Shortness Of Breath and Palpitations 05/11/2011  . Nsaids  02/15/2013  . Tramadol Shortness Of Breath 10/30/2014  . Gabapentin Other (See Comments) 02/02/2017  . Penicillins Other (See Comments) 05/11/2011    Family History  Problem Relation Age of Onset  . Heart attack Mother   . Hypertension Mother   . Heart attack Father   . Asthma Father   . Hyperlipidemia Father   . Hypertension Father   . Heart attack Sister   . Arrhythmia Sister   . Asthma Sister   . Hyperlipidemia Sister   . Hypertension Sister   .  Asthma Brother   . Hypertension Brother   . Colon cancer Neg Hx     Social History   Socioeconomic History  . Marital status: Single    Spouse name: None  . Number of children: None  . Years of education: None  . Highest education level: None  Social Needs  . Financial resource strain: None  . Food insecurity - worry: None  . Food insecurity - inability: None  . Transportation needs - medical: None  . Transportation needs - non-medical: None  Occupational History  . Occupation: disability   Tobacco Use  . Smoking status: Never Smoker  . Smokeless tobacco: Never Used  Substance and Sexual Activity  . Alcohol use: No  . Drug use: No  . Sexual activity: No  Other Topics Concern  . None  Social History Narrative  . None    Review of Systems: Gen: Denies fever, chills, anorexia. Denies fatigue, weakness, weight loss.  CV: Denies chest pain, palpitations, syncope, peripheral edema, and claudication. Resp: Denies dyspnea at rest, cough, wheezing, coughing up blood, and pleurisy. GI: see HPI  Derm: Denies rash, itching, dry skin Psych: Denies depression, anxiety, memory loss, confusion. No homicidal or suicidal ideation.  Heme: Denies bruising, bleeding, and enlarged lymph nodes.  Physical Exam: BP (!) 131/91   Pulse 72   Temp (!) 97 F (36.1 C) (Oral)   Ht 5\' 2"  (1.575 m)    Wt 248 lb 9.6 oz (112.8 kg)   BMI 45.47 kg/m  General:   Alert and oriented. No distress noted. Pleasant and cooperative.  Head:  Normocephalic and atraumatic. Eyes:  Conjuctiva clear without scleral icterus. Mouth:  Oral mucosa pink and moist. Good dentition. No lesions. Abdomen:  +BS, soft, non-tender and non-distended. No rebound or guarding. No HSM or masses noted. Msk:  Symmetrical without gross deformities. Normal posture. Extremities:  Without edema. Neurologic:  Alert and  oriented x4 Psych:  Alert and cooperative. Normal mood and affect.

## 2017-05-21 NOTE — Telephone Encounter (Addendum)
LMOVM. CT ABD/PELVIS scheduled for 06/02/17 wed at 1:00pm, arrical 12:45pm, NPO 4 hours prior, pick up oral contrast prior to test.

## 2017-05-24 ENCOUNTER — Telehealth (HOSPITAL_COMMUNITY): Payer: Self-pay | Admitting: Internal Medicine

## 2017-05-24 NOTE — Progress Notes (Signed)
CC'D TO PCP °

## 2017-05-24 NOTE — Telephone Encounter (Signed)
Tried to reach patient and had to leave a message on voicemail.

## 2017-06-02 ENCOUNTER — Ambulatory Visit (HOSPITAL_COMMUNITY)
Admission: RE | Admit: 2017-06-02 | Discharge: 2017-06-02 | Disposition: A | Discharge status: Home / Self Care | Payer: Medicare Other | Source: Ambulatory Visit | Attending: Gastroenterology | Admitting: Gastroenterology

## 2017-06-02 DIAGNOSIS — I2510 Atherosclerotic heart disease of native coronary artery without angina pectoris: Secondary | ICD-10-CM | POA: Insufficient documentation

## 2017-06-02 DIAGNOSIS — R1010 Upper abdominal pain, unspecified: Secondary | ICD-10-CM | POA: Diagnosis present

## 2017-06-02 DIAGNOSIS — K439 Ventral hernia without obstruction or gangrene: Secondary | ICD-10-CM | POA: Diagnosis not present

## 2017-06-02 DIAGNOSIS — I700 Atherosclerosis of aorta: Secondary | ICD-10-CM | POA: Insufficient documentation

## 2017-06-02 MED ORDER — IOPAMIDOL (ISOVUE-300) INJECTION 61%
100.0000 mL | Freq: Once | INTRAVENOUS | Status: AC | PRN
  Administered 2017-06-02: 100 mL via INTRAVENOUS

## 2017-06-03 ENCOUNTER — Telehealth (HOSPITAL_COMMUNITY): Payer: Self-pay

## 2017-06-03 ENCOUNTER — Ambulatory Visit (HOSPITAL_COMMUNITY): Payer: Medicare Other | Attending: Internal Medicine

## 2017-06-03 ENCOUNTER — Other Ambulatory Visit: Payer: Self-pay

## 2017-06-03 ENCOUNTER — Encounter (HOSPITAL_COMMUNITY): Payer: Self-pay

## 2017-06-03 DIAGNOSIS — R2689 Other abnormalities of gait and mobility: Secondary | ICD-10-CM | POA: Insufficient documentation

## 2017-06-03 DIAGNOSIS — R29898 Other symptoms and signs involving the musculoskeletal system: Secondary | ICD-10-CM | POA: Diagnosis present

## 2017-06-03 DIAGNOSIS — M6281 Muscle weakness (generalized): Secondary | ICD-10-CM | POA: Diagnosis present

## 2017-06-03 NOTE — Therapy (Signed)
[facility] [place], [place], [address] Phone: [phone]   Fax:  [phone]  Physical Therapy Evaluation  Patient Details  Name: Deborah Murray MRN: [id number] Date of Birth: 03/06/1961 Referring Provider: Sol Passer, MD   Encounter Date: 06/03/2017  PT End of Session - 06/03/17 1811    Visit Number  1    Number of Visits  13    Date for PT Re-Evaluation  06/24/17    Authorization Type  Medicare Part A and B    Authorization Time Period  06/03/17 - 07/15/17     PT Start Time  1116    PT Stop Time  1203    PT Time Calculation (min)  47 min    Activity Tolerance  Patient tolerated treatment well;Patient limited by pain;Patient limited by fatigue    Behavior During Therapy  [facility] for tasks assessed/performed       Past Medical History:  Diagnosis Date  . Adrenal abnormality ([place])   . Adrenal insufficiency ([place]) 2013   Tx at [facility]  . Anemia   . Arthritis   . Asthma   . Atrial fibrillation ([place])   . Chronic back pain   . COPD (chronic obstructive pulmonary disease) ([place])   . GERD (gastroesophageal reflux disease)   . Glaucoma   . Headache   . Hyperlipidemia   . Hypertension   . Hypothyroidism   . Lumbar radiculopathy   . Sepsis ([place]) 11/04/2015  . Syncope and collapse 05/11/11  adrenal insuffiency  treated at [facility]  . Thyroid disease   . Vocal cord dysfunction     Past Surgical History:  Procedure Laterality Date  . ABDOMINAL HYSTERECTOMY    . BACK SURGERY     disc   . CARDIAC CATHETERIZATION  2012 at [facility]  . CHOLECYSTECTOMY    . CHOLECYSTECTOMY, LAPAROSCOPIC    . CLOSED REDUCTION METACARPAL WITH PERCUTANEOUS PINNING Left 03/26/2016   Procedure: CLOSED REDUCTION METACARPAL WITH PERCUTANEOUS PINNING;  Surgeon: Leanora Cover, MD;  Location: [facility];  Service: Orthopedics;  Laterality: Left;  . COLONOSCOPY  2010   [place]: anal papilla, otherwise normal   . COLONOSCOPY WITH PROPOFOL  N/A 03/03/2017   sigmoid divertivcula, 4 mm polyp in rectum (tubular adenoma), non-bleeding internal hemorrhoids, surveillance in 7 years  . ESOPHAGOGASTRODUODENOSCOPY N/A 09/17/2015   Surgicall altered stomach. NO specimens collected. normal esophagus  . ESOPHAGOGASTRODUODENOSCOPY (EGD) WITH PROPOFOL N/A 03/03/2017   normal esophagus  . eye surg for glaucoma    . gastic by-pass  2008   Gastric by-pass in 2008 at [facility]  . right knee surg     arthroscopy    There were no vitals filed for this visit.   Subjective Assessment - 06/03/17 1133    Subjective  Patient reports she has had difficulty walking since her back surgery on October 17, 2015 when she had a fusion from L3-5. She reports before her surgery she was experiencing numbness and tingling into her legs and that has gone away, however she still has pain. She states she also experiences knee pain and that this play and equal role to limiting her walking and balance as her back pain does. She reports several weeks back she had left knee surgery where they "tied her nerves" and that her left knee is doing better. She is scheduled to go back next month to have the same thing done to her right knee as they could  not be done at the same time. She states she is also going to get a shot in each knee for pain. She reports going up and down stairs is difficult and walking for 3 or more minutes is difficult as well. She reports it takes her about 3 minutes to get from her front door to her car and that once she reaches her car he back and knees are really bothering her. She states her pain subsides within seconds of sitting down however. She also struggles to lift items and reports lifting a jug of milk is very difficult for her. She is currently ambulating with a quad cane but states before her back surgery she did no require a device for mobility and was working as a Camera operator. She states she has had one fall over a year ago resulting in a broken  left hand which she needed surgery for. She denies all other falls.     Pertinent History  2017 first and last fall broke her left hand,     Limitations  Lifting;Standing;Walking    How long can you sit comfortably?  unlimited    How long can you stand comfortably?  15 minutes    How long can you walk comfortably?  no more than 5 minutes    Patient Stated Goals  walk without cane    Currently in Pain?  No/denies         [facility] PT Assessment - 06/03/17 0001      Assessment   Medical Diagnosis  Unsteady Gait and Knee Pain    Referring Provider  Sol Passer, MD    Onset Date/Surgical Date  -- began after back surgery - 10/17/15    Next MD Visit  06/07/2017    Prior Therapy  for shoulder surgery      Precautions   Precautions  None      Restrictions   Weight Bearing Restrictions  No      Balance Screen   Has the patient fallen in the past 6 months  No    Has the patient had a decrease in activity level because of a fear of falling?   No    Is the patient reluctant to leave their home because of a fear of falling?   Yes fearful of falling      [place] to enter    Entrance Stairs-Number of Steps  5    Entrance Stairs-Rails  Right going up    [place] - single point;Cane - quad;Walker - 2 wheels;Shower seat      Prior Function   Level of Independence  Independent with basic ADLs;Independent;Independent with community mobility with device;Independent with household mobility with device uses a scooter at grocery store    Vocation  On disability      Cognition   Overall Cognitive Status  Within Functional Limits for tasks assessed      Observation/Other Assessments   Other Surveys   Other Surveys    Activities of Balance Confidence Scale (ABC Scale)   -- perform ABC next visit to assess fear of falling       Functional Tests   Functional tests  Single leg stance      Single Leg Stance   Comments  Bil LE: 2-3 seconds      ROM / Strength   AROM / PROM / Strength  AROM;Strength      Strength   Right Hip Flexion  4-/5    Right Hip Extension  2+/5    Right Hip ABduction  3+/5    Left Hip Flexion  4-/5    Left Hip Extension  2+/5    Left Hip ABduction  3+/5    Right/Left Knee  Right;Left    Right Knee Flexion  4+/5    Right Knee Extension  4+/5    Left Knee Flexion  4+/5    Left Knee Extension  4+/5    Right Ankle Dorsiflexion  4+/5    Left Ankle Dorsiflexion  4+/5      Transfers   Five time sit to stand comments   16.3 seconds; patient sliding her hands along Bil thighs for support       Ambulation/Gait   Ambulation/Gait  Yes    Ambulation/Gait Assistance  6: Modified independent (Device/Increase time)    Ambulation Distance (Feet)  240 Feet    Assistive device  Large base quad cane    Gait Pattern  Step-to pattern;Decreased stride length;Decreased hip/knee flexion - right;Decreased hip/knee flexion - left;Decreased arm swing - right;Decreased arm swing - left;Trendelenburg;Wide base of support    Ambulation Surface  Level    Gait velocity  0.4 m/s    Stairs  Yes    Stair Management Technique  Forwards;Two rails;Step to pattern    Number of Stairs  4    Height of Stairs  6      Standardized Balance Assessment   Standardized Balance Assessment  Timed Up and Go Test;Five Times Sit to Stand    Five times sit to stand comments   16.3 seconds      Timed Up and Go Test   TUG  Normal TUG    Normal TUG (seconds)  21.2 with quad cane    TUG Comments  indicates patient is at risk for falling        Objective measurements completed on examination: See above findings.    [facility] Adult PT Treatment/Exercise - 06/03/17 0001      Exercises   Exercises  Knee/Hip      Knee/Hip Exercises: Seated   Other Seated Knee/Hip Exercises  glut set (tall sitting), 10 reps wiht 3-5 second holds       Knee/Hip Exercises: Supine   Bridges  Strengthening;Both;5 reps    Bridges Limitations  attmepted bridges but determined exercise is too advanced for patient given weakness         PT Education - 06/03/17 1810    Education provided  Yes    Education Details  educated on exam findings and appropriate POC, initiated HEP.    Person(s) Educated  Patient    Methods  Explanation;Handout    Comprehension  Verbalized understanding;Returned demonstration       PT Short Term Goals - 06/03/17 1333      PT SHORT TERM GOAL #1   Title  Patient will be independent with HEP to improve functional strength and balance.     Time  3    Period  Weeks    Status  New    Target Date  06/24/17      PT SHORT TERM GOAL #2   Title  Patient will improve MMT for limited groups by 1/2 to improve Bil LE strength to improve gait quality and ease  of stair ambulation    Time  3    Period  Weeks    Status  New      PT SHORT TERM GOAL #3   Title  Patient will improve SLS for Bil LE to 10 seconds to improve balance durign functional gait and stair ambulation.    Time  3    Period  Weeks    Status  New        PT Long Term Goals - 06/03/17 1817      PT LONG TERM GOAL #1   Title  Patient will improve MMT for limited groups by 1 to improve Bil LE strength to improve gait quality and ease of stair ambulation    Time  6    Period  Weeks    Status  New    Target Date  07/15/17      PT LONG TERM GOAL #2   Title  Patient will perform 5 times sit to stand in 12 seconds or less to demosntrate improved functional LE sterngth and balance.     Time  6    Period  Weeks    Status  New      PT LONG TERM GOAL #3   Title  Patient will perform TUG in 13 seconds or less to demosntrate improved balance and safety durign dynamic gait task and indicate decreased fall risk.    Time  3    Period  Weeks    Status  New      PT LONG TERM GOAL #4   Title  Patient will ambulate at 0.8 m/s during 3 MWT to demonstrate  improved saftey with gait, decrease risk of hospitalization, decreasse fall risk, and place them in a community ambulator category for mobility status.    Time  6    Period  Weeks    Status  New         Plan - 06/03/17 1828    Clinical Impression Statement  Ms. Schlicker presents for initial PT evaluation for difficulty with gait and balance related to knee pain.  She presents with Bil LE weakness, decreased gait and mobility, pain, decreased balance, poor activity tolerance, impaired posture, and objective testing indicates she is at risk for falling. She will benefit from skilled PT services to address the above impairments, progress towards goals, decrease pain, and improve overall QOL and mobility to return to PLOF.    History and Personal Factors relevant to plan of care:  osteoporosis, lumbar fusion L 3-5, Bil knee pain    Clinical Presentation  Stable    Clinical Decision Making  Low    Rehab Potential  Good    PT Frequency  2x / week    PT Duration  6 weeks    PT Treatment/Interventions  ADLs/Self Care Home Management;Electrical Stimulation;Moist Heat;Cryotherapy;DME Instruction;Gait training;Stair training;Therapeutic activities;Functional mobility training;Therapeutic exercise;Balance training;Neuromuscular re-education;Patient/family education;Manual techniques;Passive range of motion;Energy conservation;Taping    PT Next Visit Plan  Review Eval and goals. Initiate proximal hip strengthening, perfomr clmashells, TA activation with core strengthening, low step ups, sit to stands.     PT Home Exercise Plan  Eval : glut set seated;     Consulted and Agree with Plan of Care  Patient       Patient will benefit from skilled therapeutic intervention in order to improve the following deficits and impairments:  Abnormal gait, Decreased endurance, Decreased activity tolerance, Decreased strength, Pain, Decreased balance, Decreased mobility, Difficulty walking, Improper body  mechanics, Postural  dysfunction, Decreased knowledge of use of DME  Visit Diagnosis: Other abnormalities of gait and mobility  Muscle weakness (generalized)  Other symptoms and signs involving the musculoskeletal system     Problem List Patient Active Problem List   Diagnosis Date Noted  . Pain of upper abdomen 05/21/2017  . Rectal bleeding 02/04/2017  . Anasarca 11/18/2015  . Generalized abdominal pain   . Protein-calorie malnutrition, severe 11/05/2015  . Sepsis ([place]) 11/04/2015  . AKI (acute kidney injury) ([place])   . Pyrexia   . Acute kidney injury ([place]) 11/02/2015  . Hyponatremia 11/02/2015  . Metabolic acidosis 70/48/8891  . Hypocalcemia 11/01/2015  . C. difficile colitis 10/31/2015  . Pancolitis ([place]) 10/30/2015  . C. difficile diarrhea 10/30/2015  . Abdominal pain in female 10/30/2015  . Hypotension 10/30/2015  . History of atrial fibrillation- NSR now 10/30/2015  . History of adrenal insufficiency 10/30/2015  . History of hypertension 10/30/2015  . Esophageal reflux   . Nausea   . GERD (gastroesophageal reflux disease) 08/28/2015  . Nausea without vomiting 08/28/2015  . Abdominal pain, epigastric 08/28/2015  . Dark stools 08/28/2015  . Atrial fibrillation with RVR ([place]) 05/12/2014  . Hypokalemia 05/12/2014  . Essential hypertension 05/12/2014  . Severe persistent asthma 05/12/2014  . OSA on CPAP 05/12/2014  . Toxic nodular goiter 05/12/2014  . Morbid obesity ([place]) 05/12/2014  . Pain in joint, shoulder region 08/02/2013  . Muscle weakness (generalized) 08/02/2013  . Decreased range of motion of left shoulder 08/02/2013  . Tight fascia 08/02/2013    Kipp Brood, PT, DPT Physical Therapist with [facility]  06/03/2017 6:35 PM    [facility] [address] [place], [place], [address] Phone: [phone]   Fax:  [phone]  Name: Deborah Murray MRN: [id number] Date of Birth: 1960/11/01

## 2017-06-03 NOTE — Telephone Encounter (Signed)
l/m requested pt call us back to schedule apptments. NF 2x/ week for 6 weeks. 06/03/2017

## 2017-06-03 NOTE — Patient Instructions (Signed)
   Glute Set/Tall Sitting: 10 repetitions hold for 3-5 seconds  In a seated position, contract glute muscles (squeeze your bottom muscle like there is a dime between them, you should grow taller and sit tall while performing this exercise)

## 2017-06-08 NOTE — Progress Notes (Signed)
CT unremarkable. Please see how patient is doing with pain.

## 2017-06-09 ENCOUNTER — Encounter (HOSPITAL_COMMUNITY): Payer: Self-pay

## 2017-06-09 ENCOUNTER — Ambulatory Visit (HOSPITAL_COMMUNITY): Payer: Medicare Other

## 2017-06-09 DIAGNOSIS — R29898 Other symptoms and signs involving the musculoskeletal system: Secondary | ICD-10-CM

## 2017-06-09 DIAGNOSIS — M6281 Muscle weakness (generalized): Secondary | ICD-10-CM

## 2017-06-09 DIAGNOSIS — R2689 Other abnormalities of gait and mobility: Secondary | ICD-10-CM

## 2017-06-09 NOTE — Progress Notes (Signed)
Left Vm to return call on mobile, no answer on home phone.

## 2017-06-09 NOTE — Patient Instructions (Signed)
Bridge    Lie back, legs bent. Inhale, pressing hips up. Keeping ribs in, lengthen lower back. Exhale, rolling down along spine from top. Repeat 10 times. Do 1-2 sessions per day.  [email or web address].exer.us/55   Copyright  VHI. All rights reserved.   

## 2017-06-09 NOTE — Therapy (Signed)
[place], [place], [address] Phone: [phone]   Fax:  [phone]  Physical Therapy Treatment  Patient Details  Name: Deborah Murray MRN: [id number] Date of Birth: 1960/07/13 Referring Provider: Sol Passer, MD   Encounter Date: 06/09/2017  PT End of Session - 06/09/17 1128    Visit Number  2    Number of Visits  13    Date for PT Re-Evaluation  06/24/17    Authorization Type  Medicare Part A and B    Authorization Time Period  06/03/17 - 07/15/17     PT Start Time  1120    PT Stop Time  1202    PT Time Calculation (min)  42 min    Activity Tolerance  Patient tolerated treatment well;Patient limited by pain;Patient limited by fatigue;No increased pain decreased LBP, knee pain the same    Behavior During Therapy  [facility] for tasks assessed/performed       Past Medical History:  Diagnosis Date  . Adrenal abnormality ([place])   . Adrenal insufficiency ([place]) 2013   Tx at [facility]  . Anemia   . Arthritis   . Asthma   . Atrial fibrillation ([place])   . Chronic back pain   . COPD (chronic obstructive pulmonary disease) ([place])   . GERD (gastroesophageal reflux disease)   . Glaucoma   . Headache   . Hyperlipidemia   . Hypertension   . Hypothyroidism   . Lumbar radiculopathy   . Sepsis ([place]) 11/04/2015  . Syncope and collapse 05/11/11  adrenal insuffiency  treated [facility]  . Thyroid disease   . Vocal cord dysfunction     Past Surgical History:  Procedure Laterality Date  . ABDOMINAL HYSTERECTOMY    . BACK SURGERY     disc   . CARDIAC CATHETERIZATION  2012 at [facility]  . CHOLECYSTECTOMY    . CHOLECYSTECTOMY, LAPAROSCOPIC    . CLOSED REDUCTION METACARPAL WITH PERCUTANEOUS PINNING Left 03/26/2016   Procedure: CLOSED REDUCTION METACARPAL WITH PERCUTANEOUS PINNING;  Surgeon: Leanora Cover, MD;  Location: [place];  Service: Orthopedics;  Laterality: Left;  . COLONOSCOPY  2010   [place]: anal  papilla, otherwise normal   . COLONOSCOPY WITH PROPOFOL N/A 03/03/2017   sigmoid divertivcula, 4 mm polyp in rectum (tubular adenoma), non-bleeding internal hemorrhoids, surveillance in 7 years  . ESOPHAGOGASTRODUODENOSCOPY N/A 09/17/2015   Surgicall altered stomach. NO specimens collected. normal esophagus  . ESOPHAGOGASTRODUODENOSCOPY (EGD) WITH PROPOFOL N/A 03/03/2017   normal esophagus  . eye surg for glaucoma    . gastic by-pass  2008   G[facility] by-pass in 2008 at [facility]  . right knee surg     arthroscopy    There were no vitals filed for this visit.  Subjective Assessment - 06/09/17 1117    Subjective  Pt stated she is feeling pretty good today, good weather outside for a change.  Current pain scale for lower back and Bil knee pain Rt>Lt, pain scale 5/10 achey pain.      Pertinent History  2017 first and last fall broke her left hand,     Patient Stated Goals  walk without cane    Currently in Pain?  Yes    Pain Score  5     Pain Location  -- Back and Bil Knee pain Rt>Lt    Pain Orientation  Lower;Right;Left    Pain Descriptors / Indicators  Aching    Pain  Type  Chronic pain    Pain Onset  More than a month ago    Pain Frequency  Constant    Aggravating Factors   Lifting    Pain Relieving Factors  heat/ice    Effect of Pain on Daily Activities  increased difficulty                      [facility] Adult PT Treatment/Exercise - 06/09/17 0001      Knee/Hip Exercises: Standing   Heel Raises  10 reps;Limitations    Heel Raises Limitations  Toe raises    Forward Step Up  Both;10 reps;Hand Hold: 1;Step Height: 4" increased difficulty with Rt knee      Knee/Hip Exercises: Seated   Other Seated Knee/Hip Exercises  glut set (tall sitting), 10 reps wiht 3-5 second holds    Sit to [employer]  10 reps;without UE support      Knee/Hip Exercises: Supine   Bridges  Strengthening;2 sets;5 reps    Bridges Limitations  difficult but able to lift some    Other Supine Knee/Hip  Exercises  Bent knee raise with TA set (verbal and tactile cueing) 10x 5"      Knee/Hip Exercises: Sidelying   Clams  10x 5" RTB             PT Education - 06/09/17 1132    Education provided  Yes    Education Details  Reviewed goals, assured compliance and proper form/technique with HEP, copy of eval given to pt    Person(s) Educated  Patient    Methods  Explanation;Demonstration;Handout    Comprehension  Verbalized understanding;Returned demonstration       PT Short Term Goals - 06/03/17 1333      PT SHORT TERM GOAL #1   Title  Patient will be independent with HEP to improve functional strength and balance.     Time  3    Period  Weeks    Status  New    Target Date  06/24/17      PT SHORT TERM GOAL #2   Title  Patient will improve MMT for limited groups by 1/2 to improve Bil LE strength to improve gait quality and ease of stair ambulation    Time  3    Period  Weeks    Status  New      PT SHORT TERM GOAL #3   Title  Patient will improve SLS for Bil LE to 10 seconds to improve balance durign functional gait and stair ambulation.    Time  3    Period  Weeks    Status  New        PT Long Term Goals - 06/03/17 1817      PT LONG TERM GOAL #1   Title  Patient will improve MMT for limited groups by 1 to improve Bil LE strength to improve gait quality and ease of stair ambulation    Time  6    Period  Weeks    Status  New    Target Date  07/15/17      PT LONG TERM GOAL #2   Title  Patient will perform 5 times sit to stand in 12 seconds or less to demosntrate improved functional LE sterngth and balance.     Time  6    Period  Weeks    Status  New      PT LONG TERM GOAL #3   Title  Patient will perform TUG in 13 seconds or less to demosntrate improved balance and safety durign dynamic gait task and indicate decreased fall risk.    Time  3    Period  Weeks    Status  New      PT LONG TERM GOAL #4   Title  Patient will ambulate at 0.8 m/s during 3 MWT to  demonstrate improved saftey with gait, decrease risk of hospitalization, decreasse fall risk, and place them in a community ambulator category for mobility status.    Time  6    Period  Weeks    Status  New            Plan - 06/09/17 1137    Clinical Impression Statement  Reviewed goals, assured compliance and proper form/technqiue with HEP and copy of eval given to pt.  Session focus on core and proximal muscle strengthening.  Pt improved height wiht bridges this session, did c/o cramping Lt calf with position.  Pt able to complete all exercises with proper form following initial cueing with no reports of increased pain through session.  Noted increased difficulty with Rt knee step ups due to weakness.  Pt limited by fatigue at EOS.      Rehab Potential  Good    PT Frequency  2x / week    PT Duration  6 weeks    PT Treatment/Interventions  ADLs/Self Care Home Management;Electrical Stimulation;Moist Heat;Cryotherapy;DME Instruction;Gait training;Stair training;Therapeutic activities;Functional mobility training;Therapeutic exercise;Balance training;Neuromuscular re-education;Patient/family education;Manual techniques;Passive range of motion;Energy conservation;Taping    PT Next Visit Plan  Continue wiht proximal hip strengthening, perfomr clmashells, TA activation with core strengthening, low step ups, sit to stands.     PT Home Exercise Plan  Eval : glut set seated; bridge       Patient will benefit from skilled therapeutic intervention in order to improve the following deficits and impairments:  Abnormal gait, Decreased endurance, Decreased activity tolerance, Decreased strength, Pain, Decreased balance, Decreased mobility, Difficulty walking, Improper body mechanics, Postural dysfunction, Decreased knowledge of use of DME  Visit Diagnosis: Other abnormalities of gait and mobility  Muscle weakness (generalized)  Other symptoms and signs involving the musculoskeletal  system     Problem List Patient Active Problem List   Diagnosis Date Noted  . Pain of upper abdomen 05/21/2017  . Rectal bleeding 02/04/2017  . Anasarca 11/18/2015  . Generalized abdominal pain   . Protein-calorie malnutrition, severe 11/05/2015  . Sepsis ([place]) 11/04/2015  . AKI (acute kidney injury) ([place])   . Pyrexia   . Acute kidney injury ([place]) 11/02/2015  . Hyponatremia 11/02/2015  . Metabolic acidosis 32/67/1245  . Hypocalcemia 11/01/2015  . C. difficile colitis 10/31/2015  . Pancolitis ([place]) 10/30/2015  . C. difficile diarrhea 10/30/2015  . Abdominal pain in female 10/30/2015  . Hypotension 10/30/2015  . History of atrial fibrillation- NSR now 10/30/2015  . History of adrenal insufficiency 10/30/2015  . History of hypertension 10/30/2015  . Esophageal reflux   . Nausea   . GERD (gastroesophageal reflux disease) 08/28/2015  . Nausea without vomiting 08/28/2015  . Abdominal pain, epigastric 08/28/2015  . Dark stools 08/28/2015  . Atrial fibrillation with RVR ([place]) 05/12/2014  . Hypokalemia 05/12/2014  . Essential hypertension 05/12/2014  . Severe persistent asthma 05/12/2014  . OSA on CPAP 05/12/2014  . Toxic nodular goiter 05/12/2014  . Morbid obesity ([place]) 05/12/2014  . Pain in joint, shoulder region 08/02/2013  . Muscle weakness (generalized) 08/02/2013  . Decreased range of  motion of left shoulder 08/02/2013  . Tight fascia 08/02/2013   Ihor Austin, LPTA; CBIS [phone]  Aldona Lento 06/09/2017, 12:08 PM  [place], [place], [address] Phone: [phone]   Fax:  [phone]  Name: Deborah Murray MRN: [id number] Date of Birth: 11-Mar-1961

## 2017-06-10 ENCOUNTER — Telehealth: Payer: Self-pay | Admitting: Internal Medicine

## 2017-06-10 ENCOUNTER — Encounter: Payer: Self-pay | Admitting: Gastroenterology

## 2017-06-10 NOTE — Telephone Encounter (Signed)
PT was made aware of her CT results and said she is doing much better, just had a little pain and a little nausea.  But doing much better. Will have if has problems.

## 2017-06-10 NOTE — Telephone Encounter (Signed)
Pt said she was returning a call from DS. Please call her on the land line number  [phone]

## 2017-06-10 NOTE — Progress Notes (Signed)
LMOM to call. Mailing a letter of CT normal and to call and let us know how she is doing.

## 2017-06-11 ENCOUNTER — Encounter (HOSPITAL_COMMUNITY): Payer: Self-pay

## 2017-06-11 ENCOUNTER — Ambulatory Visit (HOSPITAL_COMMUNITY): Payer: Medicare Other | Attending: Internal Medicine

## 2017-06-11 DIAGNOSIS — M6281 Muscle weakness (generalized): Secondary | ICD-10-CM | POA: Diagnosis present

## 2017-06-11 DIAGNOSIS — R29898 Other symptoms and signs involving the musculoskeletal system: Secondary | ICD-10-CM

## 2017-06-11 DIAGNOSIS — R2689 Other abnormalities of gait and mobility: Secondary | ICD-10-CM | POA: Diagnosis present

## 2017-06-11 NOTE — Therapy (Signed)
[place], [place], [address] Phone: [phone]   Fax:  [phone]  Physical Therapy Treatment  Patient Details  Name: Deborah Murray MRN: [id number] Date of Birth: August 01, 1960 Referring Provider: Sol Passer, MD   Encounter Date: 06/11/2017  PT End of Session - 06/11/17 1529    Visit Number  3    Number of Visits  13    Date for PT Re-Evaluation  06/24/17    Authorization Type  Medicare Part A and B    Authorization Time Period  06/03/17 - 07/15/17     PT Start Time  1524    PT Stop Time  1602    PT Time Calculation (min)  38 min    Activity Tolerance  Patient tolerated treatment well;No increased pain;Patient limited by fatigue    Behavior During Therapy  [facility] for tasks assessed/performed       Past Medical History:  Diagnosis Date  . Adrenal abnormality ([place])   . Adrenal insufficiency ([place]) 2013   Tx at [facility]  . Anemia   . Arthritis   . Asthma   . Atrial fibrillation ([place])   . Chronic back pain   . COPD (chronic obstructive pulmonary disease) ([place])   . GERD (gastroesophageal reflux disease)   . Glaucoma   . Headache   . Hyperlipidemia   . Hypertension   . Hypothyroidism   . Lumbar radiculopathy   . Sepsis ([place]) 11/04/2015  . Syncope and collapse 05/11/11  adrenal insuffiency  [facility]  . Thyroid disease   . Vocal cord dysfunction     Past Surgical History:  Procedure Laterality Date  . ABDOMINAL HYSTERECTOMY    . BACK SURGERY     disc   . CARDIAC CATHETERIZATION  2012 at [facility]  . CHOLECYSTECTOMY    . CHOLECYSTECTOMY, LAPAROSCOPIC    . CLOSED REDUCTION METACARPAL WITH PERCUTANEOUS PINNING Left 03/26/2016   Procedure: CLOSED REDUCTION METACARPAL WITH PERCUTANEOUS PINNING;  Surgeon: Leanora Cover, MD;  Location: [place];  Service: Orthopedics;  Laterality: Left;  . COLONOSCOPY  2010   [place]: anal papilla, otherwise normal   . COLONOSCOPY WITH PROPOFOL N/A  03/03/2017   sigmoid divertivcula, 4 mm polyp in rectum (tubular adenoma), non-bleeding internal hemorrhoids, surveillance in 7 years  . ESOPHAGOGASTRODUODENOSCOPY N/A 09/17/2015   Surgicall altered stomach. NO specimens collected. normal esophagus  . ESOPHAGOGASTRODUODENOSCOPY (EGD) WITH PROPOFOL N/A 03/03/2017   normal esophagus  . eye surg for glaucoma    . gastic by-pass  2008   Gastric by-pass in 2008 at [facility]  . right knee surg     arthroscopy    There were no vitals filed for this visit.  Subjective Assessment - 06/11/17 1526    Subjective  Pt stated she is feeling pretty good, no reports of pain today,  Pt stated she feels she is getting stronger and balance, stated she forgot her cane and went into store without it, did well without AD surprised herself.  Pt with Rt eye red, has called MD about redness, no reports of itching.    Patient Stated Goals  walk without cane    Currently in Pain?  No/denies                      [facility] Adult PT Treatment/Exercise - 06/11/17 0001      Knee/Hip Exercises: Standing   Heel Raises  15 reps  Heel Raises Limitations  Toe raises    Forward Step Up  Both;10 reps;Hand Hold: 1;Step Height: 4"    Other Standing Knee Exercises  Marching 1 HHA 10x 3"    Other Standing Knee Exercises  sidestep 2RT inside // bars (no HHA)      Knee/Hip Exercises: Seated   Sit to [place]  10 reps;without UE support      Knee/Hip Exercises: Supine   Bridges  Strengthening;10 reps    Bridges Limitations  cueingto improve TA activation with bridge    Other Supine Knee/Hip Exercises  Bent knee raise with TA set (verbal and tactile cueing) 10x 5"      Knee/Hip Exercises: Sidelying   Hip ABduction  10 reps               PT Short Term Goals - 06/03/17 1333      PT SHORT TERM GOAL #1   Title  Patient will be independent with HEP to improve functional strength and balance.     Time  3    Period  Weeks    Status  New    Target Date   06/24/17      PT SHORT TERM GOAL #2   Title  Patient will improve MMT for limited groups by 1/2 to improve Bil LE strength to improve gait quality and ease of stair ambulation    Time  3    Period  Weeks    Status  New      PT SHORT TERM GOAL #3   Title  Patient will improve SLS for Bil LE to 10 seconds to improve balance durign functional gait and stair ambulation.    Time  3    Period  Weeks    Status  New        PT Long Term Goals - 06/03/17 1817      PT LONG TERM GOAL #1   Title  Patient will improve MMT for limited groups by 1 to improve Bil LE strength to improve gait quality and ease of stair ambulation    Time  6    Period  Weeks    Status  New    Target Date  07/15/17      PT LONG TERM GOAL #2   Title  Patient will perform 5 times sit to stand in 12 seconds or less to demosntrate improved functional LE sterngth and balance.     Time  6    Period  Weeks    Status  New      PT LONG TERM GOAL #3   Title  Patient will perform TUG in 13 seconds or less to demosntrate improved balance and safety durign dynamic gait task and indicate decreased fall risk.    Time  3    Period  Weeks    Status  New      PT LONG TERM GOAL #4   Title  Patient will ambulate at 0.8 m/s during 3 MWT to demonstrate improved saftey with gait, decrease risk of hospitalization, decreasse fall risk, and place them in a community ambulator category for mobility status.    Time  6    Period  Weeks    Status  New            Plan - 06/11/17 1601    Clinical Impression Statement  Session focus on core and proximal/functional strengthening.  Added standing marching and sidestepping for core/gluteal activation and to improve balance (HHA  required for safety).  Cueing for TA activaiton and posture through session.  Pt able to complete whole session with no reports of pain, was limited by fatigue wiht activities required seated rest breaks.      Rehab Potential  Good    PT Frequency  2x / week     PT Duration  6 weeks    PT Treatment/Interventions  ADLs/Self Care Home Management;Electrical Stimulation;Moist Heat;Cryotherapy;DME Instruction;Gait training;Stair training;Therapeutic activities;Functional mobility training;Therapeutic exercise;Balance training;Neuromuscular re-education;Patient/family education;Manual techniques;Passive range of motion;Energy conservation;Taping    PT Next Visit Plan  Continue wiht proximal hip strengthening, perfomr clmashells, TA activation with core strengthening, low step ups, sit to stands.        Patient will benefit from skilled therapeutic intervention in order to improve the following deficits and impairments:  Abnormal gait, Decreased endurance, Decreased activity tolerance, Decreased strength, Pain, Decreased balance, Decreased mobility, Difficulty walking, Improper body mechanics, Postural dysfunction, Decreased knowledge of use of DME  Visit Diagnosis: Other abnormalities of gait and mobility  Muscle weakness (generalized)  Other symptoms and signs involving the musculoskeletal system     Problem List Patient Active Problem List   Diagnosis Date Noted  . Pain of upper abdomen 05/21/2017  . Rectal bleeding 02/04/2017  . Anasarca 11/18/2015  . Generalized abdominal pain   . Protein-calorie malnutrition, severe 11/05/2015  . Sepsis ([place]) 11/04/2015  . AKI (acute kidney injury) ([place])   . Pyrexia   . Acute kidney injury ([place]) 11/02/2015  . Hyponatremia 11/02/2015  . Metabolic acidosis 41/58/3094  . Hypocalcemia 11/01/2015  . C. difficile colitis 10/31/2015  . Pancolitis ([place]) 10/30/2015  . C. difficile diarrhea 10/30/2015  . Abdominal pain in female 10/30/2015  . Hypotension 10/30/2015  . History of atrial fibrillation- NSR now 10/30/2015  . History of adrenal insufficiency 10/30/2015  . History of hypertension 10/30/2015  . Esophageal reflux   . Nausea   . GERD (gastroesophageal reflux disease) 08/28/2015  . Nausea without  vomiting 08/28/2015  . Abdominal pain, epigastric 08/28/2015  . Dark stools 08/28/2015  . Atrial fibrillation with RVR ([place]) 05/12/2014  . Hypokalemia 05/12/2014  . Essential hypertension 05/12/2014  . Severe persistent asthma 05/12/2014  . OSA on CPAP 05/12/2014  . Toxic nodular goiter 05/12/2014  . Morbid obesity ([place]) 05/12/2014  . Pain in joint, shoulder region 08/02/2013  . Muscle weakness (generalized) 08/02/2013  . Decreased range of motion of left shoulder 08/02/2013  . Tight fascia 08/02/2013   Ihor Austin, LPTA; CBIS [phone]  Aldona Lento 06/11/2017, 4:04 PM  [facility] [address] [place], [place], [address] Phone: [phone]   Fax:  [phone]  Name: Deborah Murray MRN: [id number] Date of Birth: Feb 28, 1961

## 2017-06-15 ENCOUNTER — Encounter (HOSPITAL_COMMUNITY): Payer: Self-pay

## 2017-06-15 ENCOUNTER — Ambulatory Visit (HOSPITAL_COMMUNITY): Payer: Medicare Other

## 2017-06-15 ENCOUNTER — Other Ambulatory Visit: Payer: Self-pay

## 2017-06-15 DIAGNOSIS — R29898 Other symptoms and signs involving the musculoskeletal system: Secondary | ICD-10-CM

## 2017-06-15 DIAGNOSIS — M6281 Muscle weakness (generalized): Secondary | ICD-10-CM

## 2017-06-15 DIAGNOSIS — R2689 Other abnormalities of gait and mobility: Secondary | ICD-10-CM

## 2017-06-15 NOTE — Therapy (Signed)
[facility], [place], [address] Phone: [phone]   Fax:  [phone]  Physical Therapy Treatment  Patient Details  Name: Deborah Murray MRN: [id number] Date of Birth: 11/24/1960 Referring Provider: Sol Passer, MD   Encounter Date: 06/15/2017  PT End of Session - 06/15/17 1123    Visit Number  4    Number of Visits  13    Date for PT Re-Evaluation  06/24/17    Authorization Type  Medicare Part A and B    Authorization Time Period  06/03/17 - 07/15/17     PT Start Time  1118    PT Stop Time  1200    PT Time Calculation (min)  42 min    Activity Tolerance  Patient tolerated treatment well;No increased pain    Behavior During Therapy  WFL for tasks assessed/performed       Past Medical History:  Diagnosis Date  . Adrenal abnormality ([place])   . Adrenal insufficiency ([place]) 2013   Tx at [facility]  . Anemia   . Arthritis   . Asthma   . Atrial fibrillation ([place])   . Chronic back pain   . COPD (chronic obstructive pulmonary disease) ([place])   . GERD (gastroesophageal reflux disease)   . Glaucoma   . Headache   . Hyperlipidemia   . Hypertension   . Hypothyroidism   . Lumbar radiculopathy   . Sepsis ([place]) 11/04/2015  . Syncope and collapse 05/11/11  adrenal insuffiency  treated at [facility]  . Thyroid disease   . Vocal cord dysfunction     Past Surgical History:  Procedure Laterality Date  . ABDOMINAL HYSTERECTOMY    . BACK SURGERY     disc   . CARDIAC CATHETERIZATION  2012 at [facility]  . CHOLECYSTECTOMY    . CHOLECYSTECTOMY, LAPAROSCOPIC    . CLOSED REDUCTION METACARPAL WITH PERCUTANEOUS PINNING Left 03/26/2016   Procedure: CLOSED REDUCTION METACARPAL WITH PERCUTANEOUS PINNING;  Surgeon: Leanora Cover, MD;  Location: [facility] [facility];  Service: Orthopedics;  Laterality: Left;  . COLONOSCOPY  2010   [place]: anal papilla, otherwise normal   . COLONOSCOPY WITH PROPOFOL N/A 03/03/2017   sigmoid  divertivcula, 4 mm polyp in rectum (tubular adenoma), non-bleeding internal hemorrhoids, surveillance in 7 years  . ESOPHAGOGASTRODUODENOSCOPY N/A 09/17/2015   Surgicall altered stomach. NO specimens collected. normal esophagus  . ESOPHAGOGASTRODUODENOSCOPY (EGD) WITH PROPOFOL N/A 03/03/2017   normal esophagus  . eye surg for glaucoma    . gastic by-pass  2008   G[facility] in [facility] at [facility]  . right knee surg     arthroscopy    There were no vitals filed for this visit.  Subjective Assessment - 06/15/17 1132    Subjective  Patient is feeling good today and states she had a good weekend. She states she has been able to perform her HEP with no problems and denies pain today.    Patient Stated Goals  walk without cane    Currently in Pain?  No/denies        [facility] Adult PT Treatment/Exercise - 06/15/17 0001      Knee/Hip Exercises: Standing   Heel Raises  15 reps;Both;2 seconds;2 sets    Lateral Step Up  Both;10 reps;Hand Hold: 2;1 set;Step Height: 2"    Forward Step Up  Both;10 reps;Hand Hold: 1;Step Height: 4";2 sets    Other Standing Knee Exercises  Marching 1 HHA 2x 10 Bilaterally;  3 second holds      Knee/Hip Exercises: Supine   Bridges  Strengthening;10 reps;2 sets    Bridges Limitations  verbal cues to squeeze glutes and activate TA    Other Supine Knee/Hip Exercises  Bent knee raise with TA set (verbal and tactile cueing) 10x 5"      Knee/Hip Exercises: Sidelying   Clams  1x 10 reps with red theraband        PT Education - 06/15/17 1122    Education provided  Yes    Education Details  Educated on exercsie form/technique throughout session. Updated HEP.    Person(s) Educated  Patient    Methods  Explanation;Handout    Comprehension  Verbalized understanding       PT Short Term Goals - 06/03/17 1333      PT SHORT TERM GOAL #1   Title  Patient will be independent with HEP to improve functional strength and balance.     Time  3    Period  Weeks    Status   New    Target Date  06/24/17      PT SHORT TERM GOAL #2   Title  Patient will improve MMT for limited groups by 1/2 to improve Bil LE strength to improve gait quality and ease of stair ambulation    Time  3    Period  Weeks    Status  New      PT SHORT TERM GOAL #3   Title  Patient will improve SLS for Bil LE to 10 seconds to improve balance durign functional gait and stair ambulation.    Time  3    Period  Weeks    Status  New        PT Long Term Goals - 06/03/17 1817      PT LONG TERM GOAL #1   Title  Patient will improve MMT for limited groups by 1 to improve Bil LE strength to improve gait quality and ease of stair ambulation    Time  6    Period  Weeks    Status  New    Target Date  07/15/17      PT LONG TERM GOAL #2   Title  Patient will perform 5 times sit to stand in 12 seconds or less to demosntrate improved functional LE sterngth and balance.     Time  6    Period  Weeks    Status  New      PT LONG TERM GOAL #3   Title  Patient will perform TUG in 13 seconds or less to demosntrate improved balance and safety durign dynamic gait task and indicate decreased fall risk.    Time  3    Period  Weeks    Status  New      PT LONG TERM GOAL #4   Title  Patient will ambulate at 0.8 m/s during 3 MWT to demonstrate improved saftey with gait, decrease risk of hospitalization, decreasse fall risk, and place them in a community ambulator category for mobility status.    Time  6    Period  Weeks    Status  New         Plan - 06/15/17 1123    Clinical Impression Statement  Patient is progressing well in therapy and has demonstrated improved hip muscle activation during therapeutic exercises with supine bridges. Session continued to focus on proximal hip strengthening and single limb balance training. She continues to  require cues for posture during marching to facilitate proper glut activation. Patient required rest breaks throughout session due to fatigue but was able to  perform greater repetitions this session compared to prior with no increase in pain. She will continue to benefit from skilled PT services to progress towards goals, decrease pain, and improve overall QOL and mobility to return to PLOF.     Rehab Potential  Good    PT Frequency  2x / week    PT Duration  6 weeks    PT Treatment/Interventions  ADLs/Self Care Home Management;Electrical Stimulation;Moist Heat;Cryotherapy;DME Instruction;Gait training;Stair training;Therapeutic activities;Functional mobility training;Therapeutic exercise;Balance training;Neuromuscular re-education;Patient/family education;Manual techniques;Passive range of motion;Energy conservation;Taping    PT Next Visit Plan  Continue with proximal hip strengthening, perform clamshells, TA activation with core strengthening, low step ups, sit to stands. begin tandem stance for balance and hamstring curls/knee extension for strengthening.     PT Home Exercise Plan  Eval : glut set seated; bridge; 06/15/17 - clamshell with R TB    Consulted and Agree with Plan of Care  Patient       Patient will benefit from skilled therapeutic intervention in order to improve the following deficits and impairments:  Abnormal gait, Decreased endurance, Decreased activity tolerance, Decreased strength, Pain, Decreased balance, Decreased mobility, Difficulty walking, Improper body mechanics, Postural dysfunction, Decreased knowledge of use of DME  Visit Diagnosis: Other abnormalities of gait and mobility  Muscle weakness (generalized)  Other symptoms and signs involving the musculoskeletal system     Problem List Patient Active Problem List   Diagnosis Date Noted  . Pain of upper abdomen 05/21/2017  . Rectal bleeding 02/04/2017  . Anasarca 11/18/2015  . Generalized abdominal pain   . Protein-calorie malnutrition, severe 11/05/2015  . Sepsis ([place]) 11/04/2015  . AKI (acute kidney injury) ([place])   . Pyrexia   . Acute kidney injury ([place])  11/02/2015  . Hyponatremia 11/02/2015  . Metabolic acidosis 10/07/9483  . Hypocalcemia 11/01/2015  . C. difficile colitis 10/31/2015  . Pancolitis ([place]) 10/30/2015  . C. difficile diarrhea 10/30/2015  . Abdominal pain in female 10/30/2015  . Hypotension 10/30/2015  . History of atrial fibrillation- NSR now 10/30/2015  . History of adrenal insufficiency 10/30/2015  . History of hypertension 10/30/2015  . Esophageal reflux   . Nausea   . GERD (gastroesophageal reflux disease) 08/28/2015  . Nausea without vomiting 08/28/2015  . Abdominal pain, epigastric 08/28/2015  . Dark stools 08/28/2015  . Atrial fibrillation with RVR ([place]) 05/12/2014  . Hypokalemia 05/12/2014  . Essential hypertension 05/12/2014  . Severe persistent asthma 05/12/2014  . OSA on CPAP 05/12/2014  . Toxic nodular goiter 05/12/2014  . Morbid obesity ([place]) 05/12/2014  . Pain in joint, shoulder region 08/02/2013  . Muscle weakness (generalized) 08/02/2013  . Decreased range of motion of left shoulder 08/02/2013  . Tight fascia 08/02/2013    Kipp Brood, PT, DPT Physical Therapist with [facility]  06/15/2017 12:06 PM    [facility] [address] [place], [place], [address] Phone: [phone]   Fax:  [phone]  Name: CHIMENE SALO MRN: [id number] Date of Birth: December 28, 1960

## 2017-06-15 NOTE — Patient Instructions (Addendum)
    ELASTIC BAND - SIDELYING CLAM SHELL - CLAMSHELL: 1-2 sets of 10 repetitions  While lying on your side with your knees bent and an elastic band wrapped around your knees, draw up the top knee while keeping contact of your feet together as shown.   Do not let your pelvis roll back during the lifting movement.

## 2017-06-18 ENCOUNTER — Ambulatory Visit (HOSPITAL_COMMUNITY): Payer: Medicare Other

## 2017-06-18 ENCOUNTER — Encounter (HOSPITAL_COMMUNITY): Payer: Self-pay

## 2017-06-18 DIAGNOSIS — R29898 Other symptoms and signs involving the musculoskeletal system: Secondary | ICD-10-CM

## 2017-06-18 DIAGNOSIS — R2689 Other abnormalities of gait and mobility: Secondary | ICD-10-CM | POA: Diagnosis not present

## 2017-06-18 DIAGNOSIS — M6281 Muscle weakness (generalized): Secondary | ICD-10-CM

## 2017-06-18 NOTE — Therapy (Signed)
[facility] [place], [place], [address] Phone: [phone]   Fax:  [phone]  Physical Therapy Treatment  Patient Details  Name: ADELA ESTEBAN MRN: [id number] Date of Birth: 17-Oct-1960 Referring Provider: Sol Passer, MD   Encounter Date: 06/18/2017  PT End of Session - 06/18/17 1045    Visit Number  5    Number of Visits  13    Date for PT Re-Evaluation  06/24/17    Authorization Type  Medicare Part A and B    Authorization Time Period  06/03/17 - 07/15/17     PT Start Time  0951    PT Stop Time  1040    PT Time Calculation (min)  49 min    Equipment Utilized During Treatment  Gait belt    Activity Tolerance  Patient tolerated treatment well;Patient limited by pain;No increased pain Pain scale reduced from 10/10 to 0/10 at EOS    Behavior During Therapy  [facility] for tasks assessed/performed       Past Medical History:  Diagnosis Date  . Adrenal abnormality ([place])   . Adrenal insufficiency ([place]) 2013   Tx at [facility]  . Anemia   . Arthritis   . Asthma   . Atrial fibrillation ([place])   . Chronic back pain   . COPD (chronic obstructive pulmonary disease) ([place])   . GERD (gastroesophageal reflux disease)   . Glaucoma   . Headache   . Hyperlipidemia   . Hypertension   . Hypothyroidism   . Lumbar radiculopathy   . Sepsis ([place]) 11/04/2015  . Syncope and collapse 05/11/11  adrenal insuffiency  treated at [facility]  . Thyroid disease   . Vocal cord dysfunction     Past Surgical History:  Procedure Laterality Date  . ABDOMINAL HYSTERECTOMY    . BACK SURGERY     disc   . CARDIAC CATHETERIZATION  2012 at [facility]  . CHOLECYSTECTOMY    . CHOLECYSTECTOMY, LAPAROSCOPIC    . CLOSED REDUCTION METACARPAL WITH PERCUTANEOUS PINNING Left 03/26/2016   Procedure: CLOSED REDUCTION METACARPAL WITH PERCUTANEOUS PINNING;  Surgeon: Leanora Cover, MD;  Location: [place] [facility];  Service: Orthopedics;  Laterality: Left;  . COLONOSCOPY   2010   [place]: anal papilla, otherwise normal   . COLONOSCOPY WITH PROPOFOL N/A 03/03/2017   sigmoid divertivcula, 4 mm polyp in rectum (tubular adenoma), non-bleeding internal hemorrhoids, surveillance in 7 years  . ESOPHAGOGASTRODUODENOSCOPY N/A 09/17/2015   Surgicall altered stomach. NO specimens collected. normal esophagus  . ESOPHAGOGASTRODUODENOSCOPY (EGD) WITH PROPOFOL N/A 03/03/2017   normal esophagus  . eye surg for glaucoma    . gastic by-pass  2008   Gastric by-pass in 2008 at [facility]  . right knee surg     arthroscopy    There were no vitals filed for this visit.  Subjective Assessment - 06/18/17 0953    Subjective  Pt stated her back is feeling good today but knees have been hurting, has been on the bed the last 2 days due to pain though has completed her HEP, current pain scale 10/10 Rt >Lt knee sharp pain more with weight bearing    Pertinent History  2017 first and last fall broke her left hand,     Patient Stated Goals  walk without cane    Currently in Pain?  Yes    Pain Score  10-Worst pain ever    Pain Location  Knee    Pain Orientation  Right;Left    Pain Descriptors / Indicators  Sharp    Pain Type  Chronic pain    Pain Onset  More than a month ago    Pain Frequency  Constant    Aggravating Factors   weight bearing and standing for prolonger periods of time    Pain Relieving Factors  heat/ice    Effect of Pain on Daily Activities  increased difficulty                      [facility] Adult PT Treatment/Exercise - 06/18/17 0001      Knee/Hip Exercises: Standing   Heel Raises  15 reps;Both;2 seconds;2 sets    Heel Raises Limitations  Toe raises    Knee Flexion  Strengthening;Both;10 reps alternating, verbal and tactile cueing for form    Lateral Step Up  10 reps;Hand Hold: 2;Left;Step Height: 4";Right;Step Height: 2"    Forward Step Up  Both;10 reps;Hand Hold: 1;Step Height: 4";2 sets    Other Standing Knee Exercises  Marching 1 HHA 2x 10  Bilaterally; 3 second holds    Other Standing Knee Exercises  sidestep 2RT inside // bars (no HHA); tandem stance 1 set on floor 2 sets on foam 30" holds with intermittent HHA      Knee/Hip Exercises: Supine   Bridges  Strengthening;10 reps;2 sets    Bridges Limitations  verbal cues to squeeze glutes and activate TA               PT Short Term Goals - 06/03/17 1333      PT SHORT TERM GOAL #1   Title  Patient will be independent with HEP to improve functional strength and balance.     Time  3    Period  Weeks    Status  New    Target Date  06/24/17      PT SHORT TERM GOAL #2   Title  Patient will improve MMT for limited groups by 1/2 to improve Bil LE strength to improve gait quality and ease of stair ambulation    Time  3    Period  Weeks    Status  New      PT SHORT TERM GOAL #3   Title  Patient will improve SLS for Bil LE to 10 seconds to improve balance durign functional gait and stair ambulation.    Time  3    Period  Weeks    Status  New        PT Long Term Goals - 06/03/17 1817      PT LONG TERM GOAL #1   Title  Patient will improve MMT for limited groups by 1 to improve Bil LE strength to improve gait quality and ease of stair ambulation    Time  6    Period  Weeks    Status  New    Target Date  07/15/17      PT LONG TERM GOAL #2   Title  Patient will perform 5 times sit to stand in 12 seconds or less to demosntrate improved functional LE sterngth and balance.     Time  6    Period  Weeks    Status  New      PT LONG TERM GOAL #3   Title  Patient will perform TUG in 13 seconds or less to demosntrate improved balance and safety durign dynamic gait task and indicate decreased fall risk.    Time  3  Period  Weeks    Status  New      PT LONG TERM GOAL #4   Title  Patient will ambulate at 0.8 m/s during 3 MWT to demonstrate improved saftey with gait, decrease risk of hospitalization, decreasse fall risk, and place them in a community ambulator  category for mobility status.    Time  6    Period  Weeks    Status  New            Plan - 06/18/17 1046    Clinical Impression Statement  Pt limited by knee pain initially this session, trial with Biofreeze for pain control wiht standing exercises with vast reports of reduction.  Session focus with LE/functional strengthening and additional balance activities.  Pt educated on importance of posutre to reduce pressure on anterior knee with moderate verbal and tactile cueing through session.  Cueing to reduce HHA and improve posture with all standing exercises this session.  EOS no reports of pain back of knee.  Pt was limited by fatigue with activities though did present with improved hip activation and increased height with bridges this session.      Rehab Potential  Good    PT Frequency  2x / week    PT Duration  6 weeks    PT Treatment/Interventions  ADLs/Self Care Home Management;Electrical Stimulation;Moist Heat;Cryotherapy;DME Instruction;Gait training;Stair training;Therapeutic activities;Functional mobility training;Therapeutic exercise;Balance training;Neuromuscular re-education;Patient/family education;Manual techniques;Passive range of motion;Energy conservation;Taping    PT Next Visit Plan  Continue iwht proximal hip and core strengthening, step ups, STS and balance acitvities.  Add minisquats next session.    PT Home Exercise Plan  Eval : glut set seated; bridge; 06/15/17 - clamshell with R TB       Patient will benefit from skilled therapeutic intervention in order to improve the following deficits and impairments:  Abnormal gait, Decreased endurance, Decreased activity tolerance, Decreased strength, Pain, Decreased balance, Decreased mobility, Difficulty walking, Improper body mechanics, Postural dysfunction, Decreased knowledge of use of DME  Visit Diagnosis: Other abnormalities of gait and mobility  Muscle weakness (generalized)  Other symptoms and signs involving the  musculoskeletal system     Problem List Patient Active Problem List   Diagnosis Date Noted  . Pain of upper abdomen 05/21/2017  . Rectal bleeding 02/04/2017  . Anasarca 11/18/2015  . Generalized abdominal pain   . Protein-calorie malnutrition, severe 11/05/2015  . Sepsis ([place]) 11/04/2015  . AKI (acute kidney injury) ([place])   . Pyrexia   . Acute kidney injury ([place]) 11/02/2015  . Hyponatremia 11/02/2015  . Metabolic acidosis 38/01/1750  . Hypocalcemia 11/01/2015  . C. difficile colitis 10/31/2015  . Pancolitis ([place]) 10/30/2015  . C. difficile diarrhea 10/30/2015  . Abdominal pain in female 10/30/2015  . Hypotension 10/30/2015  . History of atrial fibrillation- NSR now 10/30/2015  . History of adrenal insufficiency 10/30/2015  . History of hypertension 10/30/2015  . Esophageal reflux   . Nausea   . GERD (gastroesophageal reflux disease) 08/28/2015  . Nausea without vomiting 08/28/2015  . Abdominal pain, epigastric 08/28/2015  . Dark stools 08/28/2015  . Atrial fibrillation with RVR ([place]) 05/12/2014  . Hypokalemia 05/12/2014  . Essential hypertension 05/12/2014  . Severe persistent asthma 05/12/2014  . OSA on CPAP 05/12/2014  . Toxic nodular goiter 05/12/2014  . Morbid obesity ([place]) 05/12/2014  . Pain in joint, shoulder region 08/02/2013  . Muscle weakness (generalized) 08/02/2013  . Decreased range of motion of left shoulder 08/02/2013  . Tight fascia 08/02/2013  [address] LPTA; [place]  Aldona Lento 06/18/2017, 10:53 AM  [place], [place], [address] Phone: [phone]   Fax:  [phone]  Name: MADILYNE TADLOCK MRN: [id number] Date of Birth: 10-22-1960

## 2017-06-22 ENCOUNTER — Encounter (HOSPITAL_COMMUNITY): Payer: Self-pay

## 2017-06-22 ENCOUNTER — Ambulatory Visit (HOSPITAL_COMMUNITY): Payer: Medicare Other

## 2017-06-22 DIAGNOSIS — M6281 Muscle weakness (generalized): Secondary | ICD-10-CM

## 2017-06-22 DIAGNOSIS — R2689 Other abnormalities of gait and mobility: Secondary | ICD-10-CM

## 2017-06-22 DIAGNOSIS — R29898 Other symptoms and signs involving the musculoskeletal system: Secondary | ICD-10-CM

## 2017-06-22 NOTE — Therapy (Signed)
[place], [place], [address] Phone: [phone]   Fax:  [phone]  Physical Therapy Treatment  Patient Details  Name: Deborah Murray MRN: [id number] Date of Birth: 1960/04/20 Referring Provider: Sol Passer, MD   Encounter Date: 06/22/2017  PT End of Session - 06/22/17 1140    Visit Number  6    Number of Visits  13    Date for PT Re-Evaluation  07/15/17    Authorization Type  Medicare Part A and B    Authorization Time Period  06/03/17 - 07/15/17  Minireassess 06/24/17    PT Start Time  1126    PT Stop Time  1208    PT Time Calculation (min)  42 min    Activity Tolerance  Patient tolerated treatment well;Patient limited by fatigue;No increased pain    Behavior During Therapy  WFL for tasks assessed/performed       Past Medical History:  Diagnosis Date  . Adrenal abnormality ([place])   . Adrenal insufficiency ([place]) 2013   Tx at [facility]  . Anemia   . Arthritis   . Asthma   . Atrial fibrillation ([place])   . Chronic back pain   . COPD (chronic obstructive pulmonary disease) ([place])   . GERD (gastroesophageal reflux disease)   . Glaucoma   . Headache   . Hyperlipidemia   . Hypertension   . Hypothyroidism   . Lumbar radiculopathy   . Sepsis ([place]) 11/04/2015  . Syncope and collapse 05/11/11  adrenal insuffiency  treated [facility]  . Thyroid disease   . Vocal cord dysfunction     Past Surgical History:  Procedure Laterality Date  . ABDOMINAL HYSTERECTOMY    . BACK SURGERY     disc   . CARDIAC CATHETERIZATION  2012 at [facility]  . CHOLECYSTECTOMY    . CHOLECYSTECTOMY, LAPAROSCOPIC    . CLOSED REDUCTION METACARPAL WITH PERCUTANEOUS PINNING Left 03/26/2016   Procedure: CLOSED REDUCTION METACARPAL WITH PERCUTANEOUS PINNING;  Surgeon: Leanora Cover, MD;  Location: [place];  Service: Orthopedics;  Laterality: Left;  . COLONOSCOPY  2010   [place]: anal papilla, otherwise normal   . COLONOSCOPY  WITH PROPOFOL N/A 03/03/2017   sigmoid divertivcula, 4 mm polyp in rectum (tubular adenoma), non-bleeding internal hemorrhoids, surveillance in 7 years  . ESOPHAGOGASTRODUODENOSCOPY N/A 09/17/2015   Surgicall altered stomach. NO specimens collected. normal esophagus  . ESOPHAGOGASTRODUODENOSCOPY (EGD) WITH PROPOFOL N/A 03/03/2017   normal esophagus  . eye surg for glaucoma    . gastic by-pass  2008   G[facility] in [facility] at [facility]  . right knee surg     arthroscopy    There were no vitals filed for this visit.  Subjective Assessment - 06/22/17 1130    Subjective  Pt reports vast improvements with pain relief with the biofreeze, current pain scale 5/10 Rt and Lt knee pain and LBP.      Patient Stated Goals  walk without cane    Currently in Pain?  Yes    Pain Score  5     Pain Location  Back back and knees    Pain Orientation  Right;Left;Lower    Pain Type  Chronic pain    Pain Onset  More than a month ago    Pain Frequency  Constant    Aggravating Factors   weight bearing and standing for prolonger periods of time    Pain Relieving Factors  heat/ice    Effect of Pain on Daily Activities  increased difficulty                      [facility] Adult PT Treatment/Exercise - 06/22/17 0001      Knee/Hip Exercises: Machines for Strengthening   Other Machine  minisquats on power tower 25 degrees 2 sets x 10 reps      Knee/Hip Exercises: Standing   Heel Raises  Both;15 reps    Heel Raises Limitations  Toe raises    Knee Flexion  Strengthening;Both;10 reps alternating    Hip Flexion  10 reps Marching 1 HHA 2x 10 Bilaterally; 3 second holds    Lateral Step Up  10 reps;Hand Hold: 2;Left;Step Height: 4";Right;Step Height: 2"    Forward Step Up  Left;15 reps;Hand Hold: 1;Step Height: 4";Right;Hand Hold: 2;10 reps Lt 1 HHA, Rt 2 HHA    Other Standing Knee Exercises  RTB posture strengthening (scap retract, rows, extension) 10x each    Other Standing Knee Exercises  sidestep  2RT inside // bars (no HHA) with RTB; tandem stance 1 set on floor 2 sets on foam 30" holds with intermittent HHA               PT Short Term Goals - 06/03/17 1333      PT SHORT TERM GOAL #1   Title  Patient will be independent with HEP to improve functional strength and balance.     Time  3    Period  Weeks    Status  New    Target Date  06/24/17      PT SHORT TERM GOAL #2   Title  Patient will improve MMT for limited groups by 1/2 to improve Bil LE strength to improve gait quality and ease of stair ambulation    Time  3    Period  Weeks    Status  New      PT SHORT TERM GOAL #3   Title  Patient will improve SLS for Bil LE to 10 seconds to improve balance durign functional gait and stair ambulation.    Time  3    Period  Weeks    Status  New        PT Long Term Goals - 06/03/17 1817      PT LONG TERM GOAL #1   Title  Patient will improve MMT for limited groups by 1 to improve Bil LE strength to improve gait quality and ease of stair ambulation    Time  6    Period  Weeks    Status  New    Target Date  07/15/17      PT LONG TERM GOAL #2   Title  Patient will perform 5 times sit to stand in 12 seconds or less to demosntrate improved functional LE sterngth and balance.     Time  6    Period  Weeks    Status  New      PT LONG TERM GOAL #3   Title  Patient will perform TUG in 13 seconds or less to demosntrate improved balance and safety durign dynamic gait task and indicate decreased fall risk.    Time  3    Period  Weeks    Status  New      PT LONG TERM GOAL #4   Title  Patient will ambulate at 0.8 m/s during 3 MWT to demonstrate improved saftey with gait, decrease risk  of hospitalization, decreasse fall risk, and place them in a community ambulator category for mobility status.    Time  6    Period  Weeks    Status  New            Plan - 06/22/17 1904    Clinical Impression Statement  Added postural strengthening to improve awareness of proper  weight bearing to reduce stress on anterior knee.  Continues wiht functional strenghtening exercises with cueing to reduce forward lean through session.  Pt limited by fatigue at EOS, reports no LBP and decreased knee pain.    Rehab Potential  Good    PT Frequency  2x / week    PT Duration  6 weeks    PT Treatment/Interventions  ADLs/Self Care Home Management;Electrical Stimulation;Moist Heat;Cryotherapy;DME Instruction;Gait training;Stair training;Therapeutic activities;Functional mobility training;Therapeutic exercise;Balance training;Neuromuscular re-education;Patient/family education;Manual techniques;Passive range of motion;Energy conservation;Taping    PT Next Visit Plan  Reassess next session.  Continue iwht proximal hip and core strengthening, step ups, STS and balance acitvities, minisquats and postural strengthening.          Patient will benefit from skilled therapeutic intervention in order to improve the following deficits and impairments:  Abnormal gait, Decreased endurance, Decreased activity tolerance, Decreased strength, Pain, Decreased balance, Decreased mobility, Difficulty walking, Improper body mechanics, Postural dysfunction, Decreased knowledge of use of DME  Visit Diagnosis: Other abnormalities of gait and mobility  Muscle weakness (generalized)  Other symptoms and signs involving the musculoskeletal system     Problem List Patient Active Problem List   Diagnosis Date Noted  . Pain of upper abdomen 05/21/2017  . Rectal bleeding 02/04/2017  . Anasarca 11/18/2015  . Generalized abdominal pain   . Protein-calorie malnutrition, severe 11/05/2015  . Sepsis ([place]) 11/04/2015  . AKI (acute kidney injury) ([place])   . Pyrexia   . Acute kidney injury ([place]) 11/02/2015  . Hyponatremia 11/02/2015  . Metabolic acidosis 74/25/9563  . Hypocalcemia 11/01/2015  . C. difficile colitis 10/31/2015  . Pancolitis ([place]) 10/30/2015  . C. difficile diarrhea 10/30/2015  . Abdominal  pain in female 10/30/2015  . Hypotension 10/30/2015  . History of atrial fibrillation- NSR now 10/30/2015  . History of adrenal insufficiency 10/30/2015  . History of hypertension 10/30/2015  . Esophageal reflux   . Nausea   . GERD (gastroesophageal reflux disease) 08/28/2015  . Nausea without vomiting 08/28/2015  . Abdominal pain, epigastric 08/28/2015  . Dark stools 08/28/2015  . Atrial fibrillation with RVR ([place]) 05/12/2014  . Hypokalemia 05/12/2014  . Essential hypertension 05/12/2014  . Severe persistent asthma 05/12/2014  . OSA on CPAP 05/12/2014  . Toxic nodular goiter 05/12/2014  . Morbid obesity ([place]) 05/12/2014  . Pain in joint, shoulder region 08/02/2013  . Muscle weakness (generalized) 08/02/2013  . Decreased range of motion of left shoulder 08/02/2013  . Tight fascia 08/02/2013   Ihor Austin, LPTA; CBIS [phone]  Aldona Lento 06/22/2017, 7:07 PM  [place], [place], [address] Phone: [phone]   Fax:  [phone]  Name: DELANE STALLING MRN: [id number] Date of Birth: June 27, 1960

## 2017-06-24 ENCOUNTER — Ambulatory Visit (HOSPITAL_COMMUNITY): Payer: Medicare Other | Admitting: Physical Therapy

## 2017-06-24 ENCOUNTER — Other Ambulatory Visit: Payer: Self-pay

## 2017-06-24 ENCOUNTER — Encounter (HOSPITAL_COMMUNITY): Payer: Self-pay | Admitting: Physical Therapy

## 2017-06-24 DIAGNOSIS — R2689 Other abnormalities of gait and mobility: Secondary | ICD-10-CM | POA: Diagnosis not present

## 2017-06-24 DIAGNOSIS — R29898 Other symptoms and signs involving the musculoskeletal system: Secondary | ICD-10-CM

## 2017-06-24 DIAGNOSIS — M6281 Muscle weakness (generalized): Secondary | ICD-10-CM

## 2017-06-24 NOTE — Patient Instructions (Addendum)
Strengthening: Hip Abduction (Side-Lying)    Tighten muscles on front of left thigh, then lift leg __101-15__ inches from surface, keeping knee locked. Repeat tp right Repeat _1___ times per set. Do _1___ sets per session. Do ___2_ sessions per day.  http://orth.exer.us/622   Copyright  VHI. All rights reserved.  Straight Leg Raise (Prone)    Abdomen and head supported, keep left knee locked and raise leg at hip. Avoid arching low back. Repeat _10-15___ times per set. Do __1__ sets per session. Do __2__ sessions per day.  http://orth.exer.us/1112   Copyright  VHI. All rights reserved.  Functional Quadriceps: Chair Squat    Keeping feet flat on floor, shoulder width apart, squat as low as is comfortable. Use support as necessary. Repeat __10__ times per set. Do __1__ sets per session. Do _2___ sessions per day.  http://orth.exer.us/736   Copyright  VHI. All rights reserved.  Heel Raise: Bilateral (Standing)    Rise on balls of feet. Repeat ___10_ times per set. Do _1___ sets per session. Do ___2_ sessions per day.  http://orth.exer.us/38   Copyright  VHI. All rights reserved.

## 2017-06-24 NOTE — Therapy (Signed)
[place], [place], [address] Phone: [phone]   Fax:  [phone]  Physical Therapy Treatment  Patient Details  Name: Deborah Murray MRN: [id number] Date of Birth: 1961-04-05 Referring Provider: Sol Passer    Encounter Date: 06/24/2017  PT End of Session - 06/24/17 1147    Visit Number  7    Number of Visits  13    Date for PT Re-Evaluation  07/15/17    Authorization Type  Medicare Part A and B    Authorization Time Period  06/03/17 - 07/15/17  Minireassess 06/24/17    PT Start Time  1120    PT Stop Time  1200    PT Time Calculation (min)  40 min    Activity Tolerance  Patient tolerated treatment well;Patient limited by fatigue;No increased pain    Behavior During Therapy  WFL for tasks assessed/performed       Past Medical History:  Diagnosis Date  . Adrenal abnormality ([place])   . Adrenal insufficiency ([place]) 2013   Tx at [facility]  . Anemia   . Arthritis   . Asthma   . Atrial fibrillation ([place])   . Chronic back pain   . COPD (chronic obstructive pulmonary disease) ([place])   . GERD (gastroesophageal reflux disease)   . Glaucoma   . Headache   . Hyperlipidemia   . Hypertension   . Hypothyroidism   . Lumbar radiculopathy   . Sepsis ([place]) 11/04/2015  . Syncope and collapse 05/11/11  adrenal insuffiency  treated [facility]  . Thyroid disease   . Vocal cord dysfunction     Past Surgical History:  Procedure Laterality Date  . ABDOMINAL HYSTERECTOMY    . BACK SURGERY     disc   . CARDIAC CATHETERIZATION  2012 at [facility]  . CHOLECYSTECTOMY    . CHOLECYSTECTOMY, LAPAROSCOPIC    . CLOSED REDUCTION METACARPAL WITH PERCUTANEOUS PINNING Left 03/26/2016   Procedure: CLOSED REDUCTION METACARPAL WITH PERCUTANEOUS PINNING;  Surgeon: Leanora Cover, MD;  Location: [place];  Service: Orthopedics;  Laterality: Left;  . COLONOSCOPY  2010   [place]: anal papilla, otherwise normal   . COLONOSCOPY  WITH PROPOFOL N/A 03/03/2017   sigmoid divertivcula, 4 mm polyp in rectum (tubular adenoma), non-bleeding internal hemorrhoids, surveillance in 7 years  . ESOPHAGOGASTRODUODENOSCOPY N/A 09/17/2015   Surgicall altered stomach. NO specimens collected. normal esophagus  . ESOPHAGOGASTRODUODENOSCOPY (EGD) WITH PROPOFOL N/A 03/03/2017   normal esophagus  . eye surg for glaucoma    . gastic by-pass  2008   Gastric by-pass in 2008 at [facility]  . right knee surg     arthroscopy    There were no vitals filed for this visit.  Subjective Assessment - 06/24/17 1122    Subjective  Pt states that she is more confident walking without the cane now.  She is still having difficulty with her balance.      How long can you sit comfortably?  unlimited was unlimited    How long can you stand comfortably?  20-30 was 15 minutes     How long can you walk comfortably?  was less than five minute snow 15 mintues and sometimes without the cane.      Patient Stated Goals  walk without cane    Pain Score  2     Pain Location  Back    Pain Orientation  Lower    Pain Descriptors / Indicators  Aching    Pain Type  Chronic pain    Pain Onset  More than a month ago    Aggravating Factors   activity     Pain Relieving Factors  not sure     Multiple Pain Sites  Yes    Pain Score  6    Pain Location  Knee    Pain Orientation  Right    Pain Descriptors / Indicators  Throbbing    Pain Type  Chronic pain    Pain Onset  More than a month ago    Pain Frequency  Intermittent    Aggravating Factors   weight bearing     Pain Relieving Factors  ice     Effect of Pain on Daily Activities  decreases          [facility] PT Assessment - 06/24/17 0001      Assessment   Medical Diagnosis  Unsteady Gait and Knee Pain    Referring Provider  [name] [name]     Onset Date/Surgical Date  -- began after back surgery - 10/17/15    Next MD Visit  06/07/2017    Prior Therapy  for shoulder surgery      Precautions   Precautions   None      Restrictions   Weight Bearing Restrictions  No      Balance Screen   Has the patient fallen in the past 6 months  No    Has the patient had a decrease in activity level because of a fear of falling?   No    Is the patient reluctant to leave their home because of a fear of falling?   Yes      [place] residence    Living Arrangements  Alone    Type of [place] to enter    Entrance Stairs-Number of Steps  5    Entrance Stairs-Rails  Right going up    [place]  One level    [place] - single point;Cane - quad;Walker - 2 wheels;Shower seat      Prior Function   Level of Independence  Independent with basic ADLs;Independent;Independent with community mobility with device;Independent with household mobility with device uses a scooter at grocery store    Vocation  On disability      Cognition   Overall Cognitive Status  Within Functional Limits for tasks assessed      Observation/Other Assessments   Other Surveys   Other Surveys    Activities of Balance Confidence Scale (ABC Scale)   -- perform ABC next visit to assess fear of falling      Functional Tests   Functional tests  Single leg stance      Single Leg Stance   Comments  Rt was 2 now 2 ; LT was 2 now 8 sconds       Strength   Right Hip Flexion  5/5 was 4-/5     Right Hip Extension  3/5 was 2+/5     Right Hip ABduction  3+/5 was unable     Left Hip Flexion  5/5 was 4-    Left Hip Extension  3/5 was 2+/5     Left Hip ABduction  3+/5 was 3+     Right Knee Flexion  5/5 was 4+    Right Knee Extension  5/5 was 4+/5  Left Knee Flexion  5/5 was 4+/5    Left Knee Extension  5/5 was 4+/5     Right Ankle Dorsiflexion  4+/5 no change     Left Ankle Dorsiflexion  4+/5 no change      Transfers   Five time sit to stand comments   15.4 was 16.3  seconds; patient sliding her hands along Bil thighs for support       Ambulation/Gait    Ambulation/Gait  Yes    Ambulation/Gait Assistance  6: Modified independent (Device/Increase time)    Ambulation Distance (Feet)  288 Feet    Assistive device  Large base quad cane    Gait Pattern  Step-to pattern;Decreased stride length;Decreased hip/knee flexion - right;Decreased hip/knee flexion - left;Decreased arm swing - right;Decreased arm swing - left;Trendelenburg;Wide base of support    Gait velocity  --    Stairs  Yes    Stair Management Technique  --    Number of Stairs  --    Height of Stairs  --      Standardized Balance Assessment   Standardized Balance Assessment  Timed Up and Go Test;Five Times Sit to Stand    Five times sit to stand comments   16.3 seconds      Timed Up and Go Test   TUG  Normal TUG    Normal TUG (seconds)  21.2 with quad cane    TUG Comments  indicates patient is at risk for falling                  [facility] Adult PT Treatment/Exercise - 06/24/17 0001      Knee/Hip Exercises: Sidelying   Hip ABduction  Both;10 reps      Knee/Hip Exercises: Prone   Hip Extension  Both;5 reps      3 minute walk (at end both back and knee was hurting) pt needs verbal cuing to keep core tight.        PT Education - 06/24/17 1209    Education provided  Yes    Education Details  Updated HEP as well as given information on the Senior center and benefits of working out on a regular basis.     Person(s) Educated  Patient    Methods  Explanation;Handout    Comprehension  Verbalized understanding       PT Short Term Goals - 06/24/17 1148      PT SHORT TERM GOAL #1   Title  Patient will be independent with HEP to improve functional strength and balance.     Time  3    Period  Weeks    Status  Achieved      PT SHORT TERM GOAL #2   Title  Patient will improve MMT for limited groups by 1/2 to improve Bil LE strength to improve gait quality and ease of stair ambulation    Time  3    Period  Weeks    Status  Partially Met      PT SHORT TERM GOAL  #3   Title  Patient will improve SLS for Bil LE to 10 seconds to improve balance durign functional gait and stair ambulation.    Time  3    Period  Weeks    Status  Not Met        PT Long Term Goals - 06/24/17 1149      PT LONG TERM GOAL #1   Title  Patient will improve MMT for limited groups  by 1 to improve Bil LE strength to improve gait quality and ease of stair ambulation    Time  6    Period  Weeks    Status  On-going      PT LONG TERM GOAL #2   Title  Patient will perform 5 times sit to stand in 12 seconds or less to demosntrate improved functional LE sterngth and balance.     Baseline  3/14: 15.40    Time  6    Period  Weeks    Status  On-going      PT LONG TERM GOAL #3   Title  Patient will perform TUG in 13 seconds or less to demosntrate improved balance and safety durign dynamic gait task and indicate decreased fall risk.    Baseline  3/14: 14.09    Time  3    Period  Weeks    Status  On-going      PT LONG TERM GOAL #4   Title  Patient will ambulate at 0.8 m/s during 3 MWT to demonstrate improved saftey with gait, decrease risk of hospitalization, decreasse fall risk, and place them in a community ambulator category for mobility status.    Baseline  3/14: .49 m/s    Time  6    Period  Weeks    Status  On-going            Plan - 06/24/17 1206    Clinical Impression Statement  PT reassessed with 50% of STG met; improving but no LTG met.  Noted deficits in hip abduction and extension strength B as well as balance.  PT will continue to benefit from skilled physical theraoy to reach maximal functional improvement.  Therapist spoke to pt at length about the benefits of joining a recreation center. Therapis gave pt infomation on [facility] as it is only one street over from where she lives.     Rehab Potential  Good    PT Frequency  2x / week    PT Duration  6 weeks    PT Treatment/Interventions  ADLs/Self Care Home Management;Electrical Stimulation;Moist  Heat;Cryotherapy;DME Instruction;Gait training;Stair training;Therapeutic activities;Functional mobility training;Therapeutic exercise;Balance training;Neuromuscular re-education;Patient/family education;Manual techniques;Passive range of motion;Energy conservation;Taping    PT Next Visit Plan    Continue iwht proximal hip and core strengthening, step ups, STS and balance acitvities, minisquats and postural strengthening.          Patient will benefit from skilled therapeutic intervention in order to improve the following deficits and impairments:  Abnormal gait, Decreased endurance, Decreased activity tolerance, Decreased strength, Pain, Decreased balance, Decreased mobility, Difficulty walking, Improper body mechanics, Postural dysfunction, Decreased knowledge of use of DME  Visit Diagnosis: Other abnormalities of gait and mobility  Muscle weakness (generalized)  Other symptoms and signs involving the musculoskeletal system     Problem List Patient Active Problem List   Diagnosis Date Noted  . Pain of upper abdomen 05/21/2017  . Rectal bleeding 02/04/2017  . Anasarca 11/18/2015  . Generalized abdominal pain   . Protein-calorie malnutrition, severe 11/05/2015  . Sepsis ([place]) 11/04/2015  . AKI (acute kidney injury) ([place])   . Pyrexia   . Acute kidney injury ([place]) 11/02/2015  . Hyponatremia 11/02/2015  . Metabolic acidosis 07/86/7544  . Hypocalcemia 11/01/2015  . C. difficile colitis 10/31/2015  . Pancolitis ([place]) 10/30/2015  . C. difficile diarrhea 10/30/2015  . Abdominal pain in female 10/30/2015  . Hypotension 10/30/2015  . History of atrial fibrillation- NSR now 10/30/2015  .  History of adrenal insufficiency 10/30/2015  . History of hypertension 10/30/2015  . Esophageal reflux   . Nausea   . GERD (gastroesophageal reflux disease) 08/28/2015  . Nausea without vomiting 08/28/2015  . Abdominal pain, epigastric 08/28/2015  . Dark stools 08/28/2015  . Atrial fibrillation  with RVR ([place]) 05/12/2014  . Hypokalemia 05/12/2014  . Essential hypertension 05/12/2014  . Severe persistent asthma 05/12/2014  . OSA on CPAP 05/12/2014  . Toxic nodular goiter 05/12/2014  . Morbid obesity ([place]) 05/12/2014  . Pain in joint, shoulder region 08/02/2013  . Muscle weakness (generalized) 08/02/2013  . Decreased range of motion of left shoulder 08/02/2013  . Tight fascia 08/02/2013    Rayetta Humphrey, PT CLT [phone] 06/24/2017, 12:11 PM  [place] [address] [place], [place], [address] Phone: [phone]   Fax:  [phone]  Name: Deborah Murray MRN: [id number] Date of Birth: 07-May-1960

## 2017-06-29 ENCOUNTER — Other Ambulatory Visit: Payer: Self-pay

## 2017-06-29 ENCOUNTER — Encounter (HOSPITAL_COMMUNITY): Payer: Self-pay

## 2017-06-29 ENCOUNTER — Ambulatory Visit (HOSPITAL_COMMUNITY): Payer: Medicare Other

## 2017-06-29 DIAGNOSIS — R29898 Other symptoms and signs involving the musculoskeletal system: Secondary | ICD-10-CM

## 2017-06-29 DIAGNOSIS — R2689 Other abnormalities of gait and mobility: Secondary | ICD-10-CM

## 2017-06-29 DIAGNOSIS — M6281 Muscle weakness (generalized): Secondary | ICD-10-CM

## 2017-06-29 NOTE — Patient Instructions (Signed)
    Chair Squat: 1-2 times, 10 repetitions  Stand in front of chair of appropriate height (lower chair increases difficulty, higher chair is less difficult) Feet about shoulder width apart, sit hips back as if to sit into chair, keeping knees behind toes. Tap chair and return to upright standing, repeat.   Keep knees from turning in, and do not let knee go over toes To increase chair height stack pillows on top

## 2017-06-29 NOTE — Therapy (Signed)
[place], [place], [address] Phone: [phone]   Fax:  [phone]  Physical Therapy Treatment  Patient Details  Name: Deborah Murray MRN: [id number] Date of Birth: [birth year] Referring Provider: Sol Passer    Encounter Date: 06/29/2017  PT End of Session - 06/29/17 1116    Visit Number  8    Number of Visits  13    Date for PT Re-Evaluation  07/15/17    Authorization Type  Medicare Part A and B    Authorization Time Period  06/03/17 - 07/15/17  Minireassess 06/24/17    PT Start Time  1118    PT Stop Time  1158    PT Time Calculation (min)  40 min    Activity Tolerance  Patient tolerated treatment well;Patient limited by fatigue;No increased pain    Behavior During Therapy  WFL for tasks assessed/performed       Past Medical History:  Diagnosis Date  . Adrenal abnormality ([place])   . Adrenal insufficiency ([place]) 2013   Tx at [facility]  . Anemia   . Arthritis   . Asthma   . Atrial fibrillation ([place])   . Chronic back pain   . COPD (chronic obstructive pulmonary disease) ([place])   . GERD (gastroesophageal reflux disease)   . Glaucoma   . Headache   . Hyperlipidemia   . Hypertension   . Hypothyroidism   . Lumbar radiculopathy   . Sepsis ([place]) 11/04/2015  . Syncope and collapse 05/11/11  adrenal insuffiency  [facility]  . Thyroid disease   . Vocal cord dysfunction     Past Surgical History:  Procedure Laterality Date  . ABDOMINAL HYSTERECTOMY    . BACK SURGERY     disc   . CARDIAC CATHETERIZATION  2012 at [facility]  . CHOLECYSTECTOMY    . CHOLECYSTECTOMY, LAPAROSCOPIC    . CLOSED REDUCTION METACARPAL WITH PERCUTANEOUS PINNING Left 03/26/2016   Procedure: CLOSED REDUCTION METACARPAL WITH PERCUTANEOUS PINNING;  Surgeon: Leanora Cover, MD;  Location: [facility];  Service: Orthopedics;  Laterality: Left;  . COLONOSCOPY  2010   [place]: anal papilla, otherwise normal   . COLONOSCOPY  WITH PROPOFOL N/A 03/03/2017   sigmoid divertivcula, 4 mm polyp in rectum (tubular adenoma), non-bleeding internal hemorrhoids, surveillance in 7 years  . ESOPHAGOGASTRODUODENOSCOPY N/A 09/17/2015   Surgicall altered stomach. NO specimens collected. normal esophagus  . ESOPHAGOGASTRODUODENOSCOPY (EGD) WITH PROPOFOL N/A 03/03/2017   normal esophagus  . eye surg for glaucoma    . gastic by-pass  2008   Gastric by-pass in 2008 at [facility]  . right knee surg     arthroscopy    There were no vitals filed for this visit.  Subjective Assessment - 06/29/17 1122    Subjective  Patient states she is feeling well today and arrives with her cane. She reports she uses it occasionally when she is going out in the community. She states she has given some thought to the senior rec center in town and that she is plannign to stop by there after physical therapy today. She reports last night she was in a lot of pain and it kept her up so she didn't sleep well.     Limitations  Lifting;Standing;Walking    How long can you sit comfortably?  unlimited was unlimited    How long can you stand comfortably?  20-30 was 15 minutes     How long can  you walk comfortably?  was less than five minute snow 15 mintues and sometimes without the cane.      Patient Stated Goals  walk without cane    Currently in Pain?  No/denies         [facility] Adult PT Treatment/Exercise - 06/29/17 0001      Knee/Hip Exercises: Standing   Heel Raises  Both;1 set;20 reps    Heel Raises Limitations  Toe raises 1x 20 reps    Lateral Step Up  Step Height: 4";15 reps;Hand Hold: 1;Both;1 set    Forward Step Up  15 reps;Hand Hold: 1;Step Height: 4";Both;1 set    Functional Squat  2 sets;10 reps;Limitations    Functional Squat Limitations  chair taps (foam on chair to increase height) for slow eccentric lowering    Other Standing Knee Exercises  2x 10 reps Bil LE, 3 secodn holds on foam/compliant surface         PT Education - 06/29/17  1126    Education provided  Yes    Education Details  Educated on exercise form/posture throughout session. Encouraged to check out senior center as she appears to have a positive response to exercise with decreased pain follwoign activity. Updated HEP.    Person(s) Educated  Patient    Methods  Explanation;Handout    Comprehension  Verbalized understanding       PT Short Term Goals - 06/24/17 1148      PT SHORT TERM GOAL #1   Title  Patient will be independent with HEP to improve functional strength and balance.     Time  3    Period  Weeks    Status  Achieved      PT SHORT TERM GOAL #2   Title  Patient will improve MMT for limited groups by 1/2 to improve Bil LE strength to improve gait quality and ease of stair ambulation    Time  3    Period  Weeks    Status  Partially Met      PT SHORT TERM GOAL #3   Title  Patient will improve SLS for Bil LE to 10 seconds to improve balance durign functional gait and stair ambulation.    Time  3    Period  Weeks    Status  Not Met        PT Long Term Goals - 06/24/17 1149      PT LONG TERM GOAL #1   Title  Patient will improve MMT for limited groups by 1 to improve Bil LE strength to improve gait quality and ease of stair ambulation    Time  6    Period  Weeks    Status  On-going      PT LONG TERM GOAL #2   Title  Patient will perform 5 times sit to stand in 12 seconds or less to demosntrate improved functional LE sterngth and balance.     Baseline  3/14: 15.40    Time  6    Period  Weeks    Status  On-going      PT LONG TERM GOAL #3   Title  Patient will perform TUG in 13 seconds or less to demosntrate improved balance and safety durign dynamic gait task and indicate decreased fall risk.    Baseline  3/14: 14.09    Time  3    Period  Weeks    Status  On-going      PT LONG TERM GOAL #4  Title  Patient will ambulate at 0.8 m/s during 3 MWT to demonstrate improved saftey with gait, decrease risk of hospitalization,  decreasse fall risk, and place them in a community ambulator category for mobility status.    Baseline  3/14: .49 m/s    Time  6    Period  Weeks    Status  On-going        Plan - 06/29/17 1127    Clinical Impression Statement  Patient arrived with no pain and reported minimal right knee pain throughout session that was relieved with seated rest breaks. Session focused on LE/functional strengthening this and patient was able to advance repetition and step height for lateral step-ups. She required cues during start of session to reduce HHA and improve posture with all standing exercises however during later exercises demonstrated improved awareness for proper posture without cuing. Pt was limited by fatigue and continues to require frequent rest breaks throughout session. She was further educated on benefits of recreational activity and encouraged to look into participating in exercise group at senior center. She will continue to benefit from skilled PT services to progress towards goals, decrease pain, and improve overall QOL and mobility to return to PLOF.    Rehab Potential  Good    PT Frequency  2x / week    PT Duration  6 weeks    PT Treatment/Interventions  ADLs/Self Care Home Management;Electrical Stimulation;Moist Heat;Cryotherapy;DME Instruction;Gait training;Stair training;Therapeutic activities;Functional mobility training;Therapeutic exercise;Balance training;Neuromuscular re-education;Patient/family education;Manual techniques;Passive range of motion;Energy conservation;Taping    PT Next Visit Plan    Continue with proximal hip and core strengthening, step ups, STS and balance acitvities, minisquats and postural strengthening. Continue sidelying and supine strengthening to reduce knee pain.    PT Home Exercise Plan  Eval : glut set seated; bridge; 06/15/17 - clamshell with R TB; 06/29/17 - chair taps for mini-squats;     Consulted and Agree with Plan of Care  Patient       Patient will  benefit from skilled therapeutic intervention in order to improve the following deficits and impairments:  Abnormal gait, Decreased endurance, Decreased activity tolerance, Decreased strength, Pain, Decreased balance, Decreased mobility, Difficulty walking, Improper body mechanics, Postural dysfunction, Decreased knowledge of use of DME  Visit Diagnosis: Other abnormalities of gait and mobility  Muscle weakness (generalized)  Other symptoms and signs involving the musculoskeletal system     Problem List Patient Active Problem List   Diagnosis Date Noted  . Pain of upper abdomen 05/21/2017  . Rectal bleeding 02/04/2017  . Anasarca 11/18/2015  . Generalized abdominal pain   . Protein-calorie malnutrition, severe 11/05/2015  . Sepsis ([place]) 11/04/2015  . AKI (acute kidney injury) ([place])   . Pyrexia   . Acute kidney injury ([place]) 11/02/2015  . Hyponatremia 11/02/2015  . Metabolic acidosis 81/85/6314  . Hypocalcemia 11/01/2015  . C. difficile colitis 10/31/2015  . Pancolitis ([place]) 10/30/2015  . C. difficile diarrhea 10/30/2015  . Abdominal pain in female 10/30/2015  . Hypotension 10/30/2015  . History of atrial fibrillation- NSR now 10/30/2015  . History of adrenal insufficiency 10/30/2015  . History of hypertension 10/30/2015  . Esophageal reflux   . Nausea   . GERD (gastroesophageal reflux disease) 08/28/2015  . Nausea without vomiting 08/28/2015  . Abdominal pain, epigastric 08/28/2015  . Dark stools 08/28/2015  . Atrial fibrillation with RVR ([place]) 05/12/2014  . Hypokalemia 05/12/2014  . Essential hypertension 05/12/2014  . Severe persistent asthma 05/12/2014  . OSA on CPAP 05/12/2014  .  Toxic nodular goiter 05/12/2014  . Morbid obesity ([place]) 05/12/2014  . Pain in joint, shoulder region 08/02/2013  . Muscle weakness (generalized) 08/02/2013  . Decreased range of motion of left shoulder 08/02/2013  . Tight fascia 08/02/2013    Kipp Brood, PT, DPT Physical  Therapist with [facility]  06/29/2017 12:08 PM    [place], [place], [address] Phone: [phone]   Fax:  [phone]  Name: Deborah Murray MRN: [id number] Date of Birth: July 11, 1960

## 2017-07-01 ENCOUNTER — Other Ambulatory Visit: Payer: Self-pay

## 2017-07-01 ENCOUNTER — Ambulatory Visit (HOSPITAL_COMMUNITY): Payer: Medicare Other

## 2017-07-01 ENCOUNTER — Encounter (HOSPITAL_COMMUNITY): Payer: Self-pay

## 2017-07-01 DIAGNOSIS — M6281 Muscle weakness (generalized): Secondary | ICD-10-CM

## 2017-07-01 DIAGNOSIS — R29898 Other symptoms and signs involving the musculoskeletal system: Secondary | ICD-10-CM

## 2017-07-01 DIAGNOSIS — R2689 Other abnormalities of gait and mobility: Secondary | ICD-10-CM

## 2017-07-01 NOTE — Therapy (Signed)
[place], [place], [address] Phone: [phone]   Fax:  [phone]  Physical Therapy Treatment  Patient Details  Name: Deborah Murray MRN: [id number] Date of Birth: 07/20/1960 Referring Provider: Sol Passer    Encounter Date: 07/01/2017  PT End of Session - 07/01/17 1125    Visit Number  9    Number of Visits  13    Date for PT Re-Evaluation  07/15/17    Authorization Type  Medicare Part A and B    Authorization Time Period  06/03/17 - 07/15/17  Minireassess 06/24/17    PT Start Time  1117    PT Stop Time  1156    PT Time Calculation (min)  39 min    Activity Tolerance  Patient tolerated treatment well;Patient limited by fatigue;No increased pain    Behavior During Therapy  WFL for tasks assessed/performed       Past Medical History:  Diagnosis Date  . Adrenal abnormality ([place])   . Adrenal insufficiency ([place]) 2013   Tx at [facility]  . Anemia   . Arthritis   . Asthma   . Atrial fibrillation ([place])   . Chronic back pain   . COPD (chronic obstructive pulmonary disease) ([place])   . GERD (gastroesophageal reflux disease)   . Glaucoma   . Headache   . Hyperlipidemia   . Hypertension   . Hypothyroidism   . Lumbar radiculopathy   . Sepsis ([place]) 11/04/2015  . Syncope and collapse 05/11/11  adrenal insuffiency  [facility]  . Thyroid disease   . Vocal cord dysfunction     Past Surgical History:  Procedure Laterality Date  . ABDOMINAL HYSTERECTOMY    . BACK SURGERY     disc   . CARDIAC CATHETERIZATION  2012 at [facility]  . CHOLECYSTECTOMY    . CHOLECYSTECTOMY, LAPAROSCOPIC    . CLOSED REDUCTION METACARPAL WITH PERCUTANEOUS PINNING Left 03/26/2016   Procedure: CLOSED REDUCTION METACARPAL WITH PERCUTANEOUS PINNING;  Surgeon: Leanora Cover, MD;  Location: [facility];  Service: Orthopedics;  Laterality: Left;  . COLONOSCOPY  2010   [place]: anal papilla, otherwise normal   . COLONOSCOPY  WITH PROPOFOL N/A 03/03/2017   sigmoid divertivcula, 4 mm polyp in rectum (tubular adenoma), non-bleeding internal hemorrhoids, surveillance in 7 years  . ESOPHAGOGASTRODUODENOSCOPY N/A 09/17/2015   Surgicall altered stomach. NO specimens collected. normal esophagus  . ESOPHAGOGASTRODUODENOSCOPY (EGD) WITH PROPOFOL N/A 03/03/2017   normal esophagus  . eye surg for glaucoma    . gastic by-pass  2008   Gastric by-pass in 2008 at [facility]  . right knee surg     arthroscopy    There were no vitals filed for this visit.  Subjective Assessment - 07/01/17 1122    Subjective  Patient reports she is feeling good today, and that she spent yesterday with a friend. She states her friedn kept commmenting for her to stand up straight when she was walking and it reminded her of therapy. She denies pain today.    Limitations  Lifting;Standing;Walking    How long can you sit comfortably?  unlimited was unlimited    How long can you stand comfortably?  20-30 was 15 minutes     How long can you walk comfortably?  was less than five minute snow 15 mintues and sometimes without the cane.      Patient Stated Goals  walk without cane    Currently in  Pain?  No/denies        [facility] Adult PT Treatment/Exercise - 07/01/17 0001      Knee/Hip Exercises: Standing   Heel Raises  Both;1 set;20 reps    Heel Raises Limitations  Toe raises 1x 20     Lateral Step Up  Step Height: 4";15 reps;Hand Hold: 1;Both;1 set    Forward Step Up  15 reps;Hand Hold: 1;Step Height: 4";Both;1 set      Knee/Hip Exercises: Sidelying   Hip ABduction  Both;15 reps      Knee/Hip Exercises: Prone   Hip Extension  Both;10 reps       Balance Exercises - 07/01/17 1148      Balance Exercises: Standing   Tandem Stance  4 reps;30 secs;Foam/compliant surface alternating foot alignment; foam surface on 2nd set    Marching Limitations  1x 10 Bil LE on foam awith ! HHA and 3-5 seoond holds        PT Education - 07/01/17 1124     Education provided  Yes    Education Details  Educated on form this session with exercises and on maintaining good posture. Encouraged to start looking into senior center before she fininshes therapy.    Person(s) Educated  Patient    Methods  Explanation    Comprehension  Verbalized understanding       PT Short Term Goals - 06/24/17 1148      PT SHORT TERM GOAL #1   Title  Patient will be independent with HEP to improve functional strength and balance.     Time  3    Period  Weeks    Status  Achieved      PT SHORT TERM GOAL #2   Title  Patient will improve MMT for limited groups by 1/2 to improve Bil LE strength to improve gait quality and ease of stair ambulation    Time  3    Period  Weeks    Status  Partially Met      PT SHORT TERM GOAL #3   Title  Patient will improve SLS for Bil LE to 10 seconds to improve balance durign functional gait and stair ambulation.    Time  3    Period  Weeks    Status  Not Met        PT Long Term Goals - 06/24/17 1149      PT LONG TERM GOAL #1   Title  Patient will improve MMT for limited groups by 1 to improve Bil LE strength to improve gait quality and ease of stair ambulation    Time  6    Period  Weeks    Status  On-going      PT LONG TERM GOAL #2   Title  Patient will perform 5 times sit to stand in 12 seconds or less to demosntrate improved functional LE sterngth and balance.     Baseline  3/14: 15.40    Time  6    Period  Weeks    Status  On-going      PT LONG TERM GOAL #3   Title  Patient will perform TUG in 13 seconds or less to demosntrate improved balance and safety durign dynamic gait task and indicate decreased fall risk.    Baseline  3/14: 14.09    Time  3    Period  Weeks    Status  On-going      PT LONG TERM GOAL #4   Title  Patient will ambulate at 0.8 m/s during 3 MWT to demonstrate improved saftey with gait, decrease risk of hospitalization, decreasse fall risk, and place them in a community ambulator category  for mobility status.    Baseline  3/14: .49 m/s    Time  6    Period  Weeks    Status  On-going        Plan - 07/01/17 1126    Clinical Impression Statement  Patient arrived with no pain and continued with LE/functional strengthening this and patient was able to advance repetition and step height for lateral step-ups. She continues to require cues for posture throughout session, however has improved awareness and self corrects posture more often. Pt was limited by fatigue and continues to require frequent rest breaks throughout session. Side-lying and prone exercises were incorporated today to decrease WB on knees for pain relief. Initiated tandem balance exercises on foam as patient demosntrated good control. She will continue to benefit from skilled PT services to progress towards goals, decrease pain, and improve overall QOL and mobility to return to PLOF.    Rehab Potential  Good    PT Frequency  2x / week    PT Duration  6 weeks    PT Treatment/Interventions  ADLs/Self Care Home Management;Electrical Stimulation;Moist Heat;Cryotherapy;DME Instruction;Gait training;Stair training;Therapeutic activities;Functional mobility training;Therapeutic exercise;Balance training;Neuromuscular re-education;Patient/family education;Manual techniques;Passive range of motion;Energy conservation;Taping    PT Next Visit Plan    Continue with proximal hip and core strengthening, step ups, STS and balance acitvities, minisquats and postural strengthening. Continue sidelying and supine strengthening to reduce knee pain. Continue tandem stance and SLS activities.    PT Home Exercise Plan  Eval : glut set seated; bridge; 06/15/17 - clamshell with R TB; 06/29/17 - chair taps for mini-squats;     Consulted and Agree with Plan of Care  Patient       Patient will benefit from skilled therapeutic intervention in order to improve the following deficits and impairments:  Abnormal gait, Decreased endurance, Decreased  activity tolerance, Decreased strength, Pain, Decreased balance, Decreased mobility, Difficulty walking, Improper body mechanics, Postural dysfunction, Decreased knowledge of use of DME  Visit Diagnosis: Other abnormalities of gait and mobility  Muscle weakness (generalized)  Other symptoms and signs involving the musculoskeletal system     Problem List Patient Active Problem List   Diagnosis Date Noted  . Pain of upper abdomen 05/21/2017  . Rectal bleeding 02/04/2017  . Anasarca 11/18/2015  . Generalized abdominal pain   . Protein-calorie malnutrition, severe 11/05/2015  . Sepsis ([place]) 11/04/2015  . AKI (acute kidney injury) ([place])   . Pyrexia   . Acute kidney injury ([place]) 11/02/2015  . Hyponatremia 11/02/2015  . Metabolic acidosis 69/45/0388  . Hypocalcemia 11/01/2015  . C. difficile colitis 10/31/2015  . Pancolitis ([place]) 10/30/2015  . C. difficile diarrhea 10/30/2015  . Abdominal pain in female 10/30/2015  . Hypotension 10/30/2015  . History of atrial fibrillation- NSR now 10/30/2015  . History of adrenal insufficiency 10/30/2015  . History of hypertension 10/30/2015  . Esophageal reflux   . Nausea   . GERD (gastroesophageal reflux disease) 08/28/2015  . Nausea without vomiting 08/28/2015  . Abdominal pain, epigastric 08/28/2015  . Dark stools 08/28/2015  . Atrial fibrillation with RVR ([place]) 05/12/2014  . Hypokalemia 05/12/2014  . Essential hypertension 05/12/2014  . Severe persistent asthma 05/12/2014  . OSA on CPAP 05/12/2014  . Toxic nodular goiter 05/12/2014  . Morbid obesity ([place]) 05/12/2014  . Pain in joint,  shoulder region 08/02/2013  . Muscle weakness (generalized) 08/02/2013  . Decreased range of motion of left shoulder 08/02/2013  . Tight fascia 08/02/2013    Kipp Brood, PT, DPT Physical Therapist with [facility]  07/01/2017 12:01 PM    [place], [place], [address] Phone: [phone]   Fax:  [phone]  Name: Deborah Murray MRN: [id number] Date of Birth: 01-30-1961

## 2017-07-15 ENCOUNTER — Telehealth (HOSPITAL_COMMUNITY): Payer: Self-pay

## 2017-07-15 NOTE — Telephone Encounter (Signed)
Patient want to come in for a re-eval to determin if she is ready for discharge or not. She is feeling some better WT

## 2017-07-16 ENCOUNTER — Ambulatory Visit: Payer: Medicare Other | Admitting: Gastroenterology

## 2017-07-22 ENCOUNTER — Ambulatory Visit (HOSPITAL_COMMUNITY): Payer: Medicare Other | Attending: Internal Medicine

## 2017-07-22 ENCOUNTER — Other Ambulatory Visit: Payer: Self-pay

## 2017-07-22 ENCOUNTER — Encounter (HOSPITAL_COMMUNITY): Payer: Self-pay

## 2017-07-22 DIAGNOSIS — R29898 Other symptoms and signs involving the musculoskeletal system: Secondary | ICD-10-CM | POA: Diagnosis present

## 2017-07-22 DIAGNOSIS — R2689 Other abnormalities of gait and mobility: Secondary | ICD-10-CM | POA: Diagnosis present

## 2017-07-22 DIAGNOSIS — M6281 Muscle weakness (generalized): Secondary | ICD-10-CM | POA: Insufficient documentation

## 2017-07-22 NOTE — Therapy (Signed)
[place], [place], [address] Phone: [phone]   Fax:  [phone]  Physical Therapy Treatment/Discharge Summary  Patient Details  Name: Deborah Murray MRN: [id number] Date of Birth: 29-Jun-1960 Referring Provider: Sol Passer   Encounter Date: 07/22/2017   PHYSICAL THERAPY DISCHARGE SUMMARY  Visits from Start of Care: 10  Current functional level related to goals / functional outcomes: Re-assessment performed today and patient has met 2/3 short term goals and partially met 3/4 short term goals. She continues to be limited by decreased balance with SLS and Left knee pain. She recently had a Lt knee injection for pain and was told it could take about 3 weeks to reduce her pain. She has made good improvements with her TUG and 5x sit to stand test and demonstrated improvement in MMT of 1 grade for most muscle groups. She has verbalized interest in participating in a recreational exercise program and has been given resources to access local community fitness facilities. She is agreeable to discharging from therapy based on her current functional status and reports feeling she can continue to improve on her own with her HEP and by starting a gym routine. She will be discharged after this session.   Remaining deficits: See below details   Education / Equipment: Educated ong olas met and remaining limitaions. Discussed transition into recreational exercis routine and reviewed local/community resources and facilities like [facility] which offers free group classes. Reviewed HEP.  Plan: Patient agrees to discharge.  Patient goals were partially met. Patient is being discharged due to meeting the stated rehab goals.  ?????       PT End of Session - 07/22/17 1155    Visit Number  10    Number of Visits  13    Date for PT Re-Evaluation  07/15/17    Authorization Type  Medicare Part A and B    Authorization Time Period   06/03/17 - 07/15/17  Minireassess 06/24/17    PT Start Time  1119    PT Stop Time  1159    PT Time Calculation (min)  40 min    Activity Tolerance  Patient tolerated treatment well;No increased pain    Behavior During Therapy  WFL for tasks assessed/performed       Past Medical History:  Diagnosis Date  . Adrenal abnormality ([place])   . Adrenal insufficiency ([place]) 2013   Tx at [facility]  . Anemia   . Arthritis   . Asthma   . Atrial fibrillation ([place])   . Chronic back pain   . COPD (chronic obstructive pulmonary disease) ([place])   . GERD (gastroesophageal reflux disease)   . Glaucoma   . Headache   . Hyperlipidemia   . Hypertension   . Hypothyroidism   . Lumbar radiculopathy   . Sepsis ([place]) 11/04/2015  . Syncope and collapse 05/11/11  adrenal insuffiency  treated [facility]  . Thyroid disease   . Vocal cord dysfunction     Past Surgical History:  Procedure Laterality Date  . ABDOMINAL HYSTERECTOMY    . BACK SURGERY     disc   . CARDIAC CATHETERIZATION  2012 at [facility]  . CHOLECYSTECTOMY    . CHOLECYSTECTOMY, LAPAROSCOPIC    . CLOSED REDUCTION METACARPAL WITH PERCUTANEOUS PINNING Left 03/26/2016   Procedure: CLOSED REDUCTION METACARPAL WITH PERCUTANEOUS PINNING;  Surgeon: Leanora Cover, MD;  Location: [facility];  Service: Orthopedics;  Laterality: Left;  . COLONOSCOPY  2010   [place]: anal papilla, otherwise normal   . COLONOSCOPY WITH PROPOFOL N/A 03/03/2017   sigmoid divertivcula, 4 mm polyp in rectum (tubular adenoma), non-bleeding internal hemorrhoids, surveillance in 7 years  . ESOPHAGOGASTRODUODENOSCOPY N/A 09/17/2015   Surgicall altered stomach. NO specimens collected. normal esophagus  . ESOPHAGOGASTRODUODENOSCOPY (EGD) WITH PROPOFOL N/A 03/03/2017   normal esophagus  . eye surg for glaucoma    . gastic by-pass  2008   Gastric by-pass in 2008 at [facility]  . right knee surg     arthroscopy    There were no vitals filed for this visit.  Subjective  Assessment - 07/22/17 1214    Subjective  Patient reports she had an injection in her left knee and it is supposed to take baout 2-3 weeks to improve her pain. She reports she has kept up with her HEP and feels like PT has helped her. She reports feeling like she can transition to a gym routine and return to PT if her knee does not improve.     Limitations  Lifting;Standing;Walking    How long can you sit comfortably?  unlimited was unlimited    How long can you stand comfortably?  20-30 was 15 minutes     How long can you walk comfortably?  was less than five minute snow 15 mintues and sometimes without the cane.      Patient Stated Goals  walk without cane    Currently in Pain?  No/denies       [facility] PT Assessment - 07/22/17 0001      Assessment   Medical Diagnosis  Unsteady Gait and Knee Pain    Referring Provider  Sol Passer    Next MD Visit  06/07/2017      Balance Screen   Has the patient fallen in the past 6 months  No      Cognition   Overall Cognitive Status  Within Functional Limits for tasks assessed      Functional Tests   Functional tests  Single leg stance      Single Leg Stance   Comments  RT LE = 6; Lt LE = 7 was 2 seconds on evaluation      Posture/Postural Control   Posture Comments  Patient continues to ambulate with forward trunk lean      Strength   Right Hip Flexion  5/5 was 4-    Right Hip Extension  3/5 was 2+    Right Hip ABduction  4-/5 was 3+    Left Hip Flexion  5/5 was 4-    Left Hip Extension  3+/5    Left Hip ABduction  4-/5 was 3+    Right Knee Flexion  5/5 was 4+    Right Knee Extension  5/5 was 4+    Left Knee Flexion  5/5 was 4+    Left Knee Extension  5/5 was 4+    Right Ankle Dorsiflexion  5/5 was 4+    Left Ankle Dorsiflexion  5/5 was 4+      Ambulation/Gait   Ambulation/Gait  Yes    Ambulation/Gait Assistance  6: Modified independent (Device/Increase time)    Ambulation Distance (Feet)  300 Feet    Assistive device  Large  base quad cane    Gait Pattern  Step-to pattern;Decreased stride length;Decreased hip/knee flexion - right;Decreased hip/knee flexion - left;Decreased arm swing - right;Decreased arm swing - left;Trendelenburg;Wide base of support    Gait velocity  0.5  m/s was 0.4 m/s      Standardized Balance Assessment   Five times sit to stand comments   13.18      Timed Up and Go Test   TUG  Normal TUG    Normal TUG (seconds)  13.52        [facility] Adult PT Treatment/Exercise - 07/22/17 0001      Transfers   Five time sit to stand comments   13.18 no UE  was 16.3      Knee/Hip Exercises: Standing   Functional Squat  1 set;15 reps    Functional Squat Limitations  chair taps (foam on chair to increase height) for slow eccentric lowering      Knee/Hip Exercises: Supine   Bridges  Strengthening;10 reps;2 sets      Knee/Hip Exercises: Sidelying   Clams  2x 10 reps with red theraband         PT Education - 07/22/17 1204    Education provided  Yes    Education Details  Educated ong olas met and remaining limitaions. Discussed transition into recreational exercis routine and reviewed local/community resources and facilities like [facility] which offers free group classes. Reviewed HEP.    Person(s) Educated  Patient    Methods  Explanation    Comprehension  Verbalized understanding;Returned demonstration       PT Short Term Goals - 07/22/17 1118      PT SHORT TERM GOAL #1   Title  Patient will be independent with HEP to improve functional strength and balance.     Time  3    Period  Weeks    Status  Achieved      PT SHORT TERM GOAL #2   Title  Patient will improve MMT for limited groups by 1/2 to improve Bil LE strength to improve gait quality and ease of stair ambulation    Time  3    Period  Weeks    Status  Achieved      PT SHORT TERM GOAL #3   Title  Patient will improve SLS for Bil LE to 10 seconds to improve balance durign functional gait and stair ambulation.    Baseline   07/22/17 - SLS for ~ 6 seconds bil LE    Time  3    Period  Weeks    Status  Not Met        PT Long Term Goals - 07/22/17 1119      PT LONG TERM GOAL #1   Title  Patient will improve MMT for limited groups by 1 to improve Bil LE strength to improve gait quality and ease of stair ambulation    Time  6    Period  Weeks    Status  Partially Met      PT LONG TERM GOAL #2   Title  Patient will perform 5 times sit to stand in 12 seconds or less to demosntrate improved functional LE sterngth and balance.     Baseline  07/22/17 - 13 seconds, no UE use and no compensation with legs against chair    Time  6    Period  Weeks    Status  Partially Met      PT LONG TERM GOAL #3   Title  Patient will perform TUG in 13 seconds or less to demosntrate improved balance and safety durign dynamic gait task and indicate decreased fall risk.    Baseline  07/22/17- 13 seconds  Time  3    Period  Weeks    Status  Achieved      PT LONG TERM GOAL #4   Title  Patient will ambulate at 0.8 m/s during 3 MWT to demonstrate improved saftey with gait, decrease risk of hospitalization, decreasse fall risk, and place them in a community ambulator category for mobility status.    Baseline  07/22/17 - 0.5 m/s    Time  6    Period  Weeks    Status  Not Met        Plan - 07/22/17 1147    Clinical Impression Statement  Re-assessment performed today and patient has met 2/3 short term goals and partially met 3/4 short term goals. She continues to be limited by decreased balance with SLS and Left knee pain. She recently had a Lt knee injection for pain and was told it could take about 3 weeks to reduce her pain. She has made good improvements with her TUG and 5x sit to stand test and demonstrated improvement in MMT of 1 grade for most muscle groups. She has verbalized interest in participating in a recreational exercise program and has been given resources to access local community fitness facilities. She is agreeable to  discharging from therapy based on her current functional status and reports feeling she can continue to improve on her own with her HEP and by starting a gym routine. She will be discharged after this session.    Rehab Potential  Good    PT Frequency  2x / week    PT Duration  6 weeks    PT Treatment/Interventions  ADLs/Self Care Home Management;Electrical Stimulation;Moist Heat;Cryotherapy;DME Instruction;Gait training;Stair training;Therapeutic activities;Functional mobility training;Therapeutic exercise;Balance training;Neuromuscular re-education;Patient/family education;Manual techniques;Passive range of motion;Energy conservation;Taping    PT Next Visit Plan  discharge this session    PT Home Exercise Plan  Eval : glut set seated; bridge; 06/15/17 - clamshell with R TB; 06/29/17 - chair taps for mini-squats;     Consulted and Agree with Plan of Care  Patient       Patient will benefit from skilled therapeutic intervention in order to improve the following deficits and impairments:  Abnormal gait, Decreased endurance, Decreased activity tolerance, Decreased strength, Pain, Decreased balance, Decreased mobility, Difficulty walking, Improper body mechanics, Postural dysfunction, Decreased knowledge of use of DME  Visit Diagnosis: Other abnormalities of gait and mobility - Plan: PT plan of care cert/re-cert  Muscle weakness (generalized) - Plan: PT plan of care cert/re-cert  Other symptoms and signs involving the musculoskeletal system - Plan: PT plan of care cert/re-cert     Problem List Patient Active Problem List   Diagnosis Date Noted  . Pain of upper abdomen 05/21/2017  . Rectal bleeding 02/04/2017  . Anasarca 11/18/2015  . Generalized abdominal pain   . Protein-calorie malnutrition, severe 11/05/2015  . Sepsis ([place]) 11/04/2015  . AKI (acute kidney injury) ([place])   . Pyrexia   . Acute kidney injury ([place]) 11/02/2015  . Hyponatremia 11/02/2015  . Metabolic acidosis 01/60/1093  .  Hypocalcemia 11/01/2015  . C. difficile colitis 10/31/2015  . Pancolitis ([place]) 10/30/2015  . C. difficile diarrhea 10/30/2015  . Abdominal pain in female 10/30/2015  . Hypotension 10/30/2015  . History of atrial fibrillation- NSR now 10/30/2015  . History of adrenal insufficiency 10/30/2015  . History of hypertension 10/30/2015  . Esophageal reflux   . Nausea   . GERD (gastroesophageal reflux disease) 08/28/2015  . Nausea without vomiting 08/28/2015  .  Abdominal pain, epigastric 08/28/2015  . Dark stools 08/28/2015  . Atrial fibrillation with RVR ([place]) 05/12/2014  . Hypokalemia 05/12/2014  . Essential hypertension 05/12/2014  . Severe persistent asthma 05/12/2014  . OSA on CPAP 05/12/2014  . Toxic nodular goiter 05/12/2014  . Morbid obesity ([place]) 05/12/2014  . Pain in joint, shoulder region 08/02/2013  . Muscle weakness (generalized) 08/02/2013  . Decreased range of motion of left shoulder 08/02/2013  . Tight fascia 08/02/2013    Kipp Brood, PT, DPT Physical Therapist with [facility]  07/22/2017 12:16 PM    [place], [place], [address] Phone: [phone]   Fax:  [phone]  Name: Deborah Murray MRN: [id number] Date of Birth: Jun 24, 1960

## 2017-07-23 ENCOUNTER — Encounter: Payer: Self-pay | Admitting: Gastroenterology

## 2017-07-23 ENCOUNTER — Ambulatory Visit (INDEPENDENT_AMBULATORY_CARE_PROVIDER_SITE_OTHER): Payer: Medicare Other | Admitting: Gastroenterology

## 2017-07-23 VITALS — BP 131/95 | HR 68 | Temp 97.0°F | Ht 62.0 in | Wt 256.2 lb

## 2017-07-23 DIAGNOSIS — R110 Nausea: Secondary | ICD-10-CM

## 2017-07-23 DIAGNOSIS — K5900 Constipation, unspecified: Secondary | ICD-10-CM | POA: Insufficient documentation

## 2017-07-23 DIAGNOSIS — Z9884 Bariatric surgery status: Secondary | ICD-10-CM | POA: Insufficient documentation

## 2017-07-23 MED ORDER — ONDANSETRON 4 MG PO TBDP: 4.0000 mg | ORAL_TABLET | Freq: Three times a day (TID) | ORAL | 3 refills | Status: DC | PRN

## 2017-07-23 NOTE — Progress Notes (Signed)
CC'ED TO PCP 

## 2017-07-23 NOTE — Progress Notes (Signed)
Primary Care Physician:  Sol Passer, MD  Primary GI: Dr. Gala Romney   Chief Complaint  Patient presents with  . Nausea    out of Zofran    HPI:   Deborah Murray is a 57 y.o. 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 Colonoscopy Nov 2018 with sigmoid diverticula, 4 mm polyp in rectum (tubular adenoma), non-bleeding internal hemorrhoids, surveillance in 7 years. EGD with normal esophagus, s/p hemigastrectomy. In interim from last visit, CT abd/pelvis done due to epigastric pain, nausea, history of bariatric procedure. CT was normal. Small supraumbilical midline ventral hernia noted. Weight increased from last visit.  Zofran prn but out. BM once a week. Some nausea. Going on since last seen in Feb 2019. No abdominal pain. May need a knee replacement in future. Limits mobility. Has to use cane. No vomiting. No other significant concerns; however, she does state she feels cold all the time.    Past Medical History:  Diagnosis Date  . Adrenal abnormality ([place])   . Adrenal insufficiency ([place]) 2013   Tx at [facility]  . Anemia   . Arthritis   . Asthma   . Atrial fibrillation ([place])   . Chronic back pain   . COPD (chronic obstructive pulmonary disease) ([place])   . GERD (gastroesophageal reflux disease)   . Glaucoma   . Headache   . Hyperlipidemia   . Hypertension   . Hypothyroidism   . Lumbar radiculopathy   . Sepsis ([place]) 11/04/2015  . Syncope and collapse 05/11/11  adrenal insuffiency  treated [facility]  . Thyroid disease   . Vocal cord dysfunction     Past Surgical History:  Procedure Laterality Date  . ABDOMINAL HYSTERECTOMY    . BACK SURGERY     disc   . CARDIAC CATHETERIZATION  2012 at [facility]  . CHOLECYSTECTOMY    . CHOLECYSTECTOMY, LAPAROSCOPIC    . CLOSED REDUCTION METACARPAL WITH PERCUTANEOUS PINNING Left 03/26/2016   Procedure: CLOSED REDUCTION METACARPAL WITH PERCUTANEOUS  PINNING;  Surgeon: Leanora Cover, MD;  Location: [facility];  Service: Orthopedics;  Laterality: Left;  . COLONOSCOPY  2010   [place]: anal papilla, otherwise normal   . COLONOSCOPY WITH PROPOFOL N/A 03/03/2017   sigmoid divertivcula, 4 mm polyp in rectum (tubular adenoma), non-bleeding internal hemorrhoids, surveillance in 7 years  . ESOPHAGOGASTRODUODENOSCOPY N/A 09/17/2015   Surgicall altered stomach. NO specimens collected. normal esophagus  . ESOPHAGOGASTRODUODENOSCOPY (EGD) WITH PROPOFOL N/A 03/03/2017   normal esophagus  . eye surg for glaucoma    . gastic by-pass  2008   [facility]pass in 2008 at [facility]  . right knee surg     arthroscopy    Current Outpatient Medications  Medication Sig Dispense Refill  . acetaminophen (TYLENOL) 500 MG tablet Take 1,000 mg every 6 (six) hours as needed by mouth (for pain.).    Marland Kitchen albuterol (PROVENTIL HFA;VENTOLIN HFA) 108 (90 BASE) MCG/ACT inhaler Inhale 2 puffs into the lungs every 4 (four) hours as needed for wheezing.     Marland Kitchen aluminum chloride (DRYSOL) 20 % external solution Apply 1 application 2 (two) times daily as needed topically (for sweating.).     Marland Kitchen amLODipine (NORVASC) 5 MG tablet Take 10 mg by mouth daily.     Marland Kitchen atorvastatin (LIPITOR) 20 MG tablet Take 20 mg by mouth every evening.     . brimonidine (ALPHAGAN)  0.2 % ophthalmic solution Place 1 drop into both eyes 2 (two) times daily.    . Calcium Carb-Ergocalciferol 500-200 MG-UNIT TABS Take 1 tablet by mouth daily.    . clindamycin (CLEOCIN T) 1 % external solution Apply 1 application at bedtime topically. Applies to underarms for sweating.    . DULoxetine (CYMBALTA) 20 MG capsule Take 40 mg by mouth daily.    Marland Kitchen EPINEPHrine (EPIPEN 2-PAK) 0.3 mg/0.3 mL IJ SOAJ injection Inject 0.3 mg daily as needed into the muscle (for anaphylatic reaction.).    Marland Kitchen fluticasone (FLONASE) 50 MCG/ACT nasal spray Place 1 spray daily into both nostrils.    . hydrocortisone (CORTEF) 20 MG  tablet Take 20 mg 2 (two) times daily by mouth. Morning & Lunch    . latanoprost (XALATAN) 0.005 % ophthalmic solution Place 1 drop into both eyes at bedtime.    Marland Kitchen levocetirizine (XYZAL) 5 MG tablet Take 5 mg every evening by mouth.    . levothyroxine (SYNTHROID, LEVOTHROID) 75 MCG tablet Take 75 mcg daily before breakfast by mouth.    . Melatonin 5 MG TABS Take by mouth at bedtime.     . montelukast (SINGULAIR) 10 MG tablet Take 10 mg by mouth at bedtime.    . nitroGLYCERIN (NITROSTAT) 0.4 MG SL tablet Place 0.4 mg under the tongue every 5 (five) minutes as needed for chest pain.    Marland Kitchen omalizumab (XOLAIR) 150 MG injection Inject 300 mg into the skin every 30 (thirty) days.    Marland Kitchen omeprazole (PRILOSEC) 40 MG capsule Take 40 mg 2 (two) times daily by mouth.    . ondansetron (ZOFRAN ODT) 4 MG disintegrating tablet Take 1 tablet (4 mg total) by mouth every 8 (eight) hours as needed. 60 tablet 3  . potassium chloride SA (K-DUR,KLOR-CON) 20 MEQ tablet Take 1 tablet (20 mEq total) by mouth 2 (two) times daily. Take two tablets three times a day for three days, then reduce to one tablet twice a day (Patient taking differently: Take 20 mEq daily by mouth. ) 72 tablet 0  . ranitidine (ZANTAC) 150 MG tablet Take 150 mg by mouth 2 (two) times daily.    . rivaroxaban (XARELTO) 20 MG TABS tablet Take 1 tablet by mouth daily.    . Teriparatide, Recombinant, (FORTEO) 600 MCG/2.4ML SOLN Inject 20 mcg at bedtime into the skin.    Marland Kitchen tiotropium (SPIRIVA HANDIHALER) 18 MCG inhalation capsule Place 1 capsule into inhaler and inhale daily.    . VOLTAREN 1 % GEL Apply 1 application 4 (four) times daily topically.      No current facility-administered medications for this visit.     Allergies as of 07/23/2017 - Review Complete 07/23/2017  Allergen Reaction Noted  . Aspirin Shortness Of Breath and Palpitations 05/11/2011  . Codeine sulfate Shortness Of Breath and Palpitations 05/11/2011  . Darvocet [propoxyphene  n-acetaminophen] Shortness Of Breath and Palpitations 05/11/2011  . Nsaids  02/15/2013  . Tramadol Shortness Of Breath 10/30/2014  . Gabapentin Other (See Comments) 02/02/2017  . Penicillins Other (See Comments) 05/11/2011    Family History  Problem Relation Age of Onset  . Heart attack Mother   . Hypertension Mother   . Heart attack Father   . Asthma Father   . Hyperlipidemia Father   . Hypertension Father   . Heart attack Sister   . Arrhythmia Sister   . Asthma Sister   . Hyperlipidemia Sister   . Hypertension Sister   . Asthma Brother   .  Hypertension Brother   . Colon cancer Neg Hx     Social History   Socioeconomic History  . Marital status: Single    Spouse name: Not on file  . Number of children: Not on file  . Years of education: Not on file  . Highest education level: Not on file  Occupational History  . Occupation: disability   Social Needs  . Financial resource strain: Not on file  . Food insecurity:    Worry: Not on file    Inability: Not on file  . Transportation needs:    Medical: Not on file    Non-medical: Not on file  Tobacco Use  . Smoking status: Never Smoker  . Smokeless tobacco: Never Used  Substance and Sexual Activity  . Alcohol use: No  . Drug use: No  . Sexual activity: Never  Lifestyle  . Physical activity:    Days per week: Not on file    Minutes per session: Not on file  . Stress: Not on file  Relationships  . Social connections:    Talks on phone: Not on file    Gets together: Not on file    Attends religious service: Not on file    Active member of club or organization: Not on file    Attends meetings of clubs or organizations: Not on file    Relationship status: Not on file  Other Topics Concern  . Not on file  Social History Narrative  . Not on file    Review of Systems: As mentioned in HPI   Physical Exam: BP (!) 131/95   Pulse 68   Temp (!) 97 F (36.1 C) (Oral)   Ht 5\' 2"  (1.575 m)   Wt 256 lb 3.2 oz  (116.2 kg)   BMI 46.86 kg/m  General:   Alert and oriented. No distress noted. Pleasant and cooperative.  Head:  Normocephalic and atraumatic. Eyes:  Conjuctiva clear without scleral icterus. Mouth:  Oral mucosa pink and moist.  Abdomen:  +BS, soft, non-tender and non-distended. No rebound or guarding. No HSM or masses noted. Msk:  Symmetrical without gross deformities. Normal posture. Cane to assist with ambulation Extremities:  Without edema. Neurologic:  Alert and  oriented x4 Psych:  Alert and cooperative. Normal mood and affect.  Lab Results  Component Value Date   WBC 4.6 03/03/2017   HGB 12.5 03/03/2017   HCT 40.3 03/03/2017   MCV 91.4 03/03/2017   PLT 317 03/03/2017

## 2017-07-23 NOTE — Assessment & Plan Note (Signed)
Check CBC, iron studies now. TCS/EGD on file. Back on Xarelto. If IDA, would be less ideal to pursue capsule due to post-surgical anatomy (bariatric procedure). No overt GI bleeding.

## 2017-07-23 NOTE — Assessment & Plan Note (Signed)
Start Linzess 145 mcg once daily. May need titration. Return in 2-3 months.

## 2017-07-23 NOTE — Assessment & Plan Note (Signed)
Chronic nausea, EGD on file, no alarm symptoms. CT recently unrevealing. Likely multifactorial in setting of constipation. Will start bowel regimen, use Zofran only as needed. Continue PPI. Weight has actually increased. Return in 2-3 months.

## 2017-07-23 NOTE — Patient Instructions (Signed)
For constipation: start taking Linzess 1 capsule each morning on an empty stomach, 30 minutes before breakfast. You might have loose stool for a few days, but it should get better. Let me know if not, and let me know if we need to adjust dose! We will send to pharmacy if you like this.  Please have blood work done today. I am just checking iron studies today.  Take Zofran just as needed, as this can have a side effect of constipation as well.  We will see you in 2-3 months!  It was a pleasure to see you today. I strive to create trusting relationships with patients to provide genuine, compassionate, and quality care. I value your feedback. If you receive a survey regarding your visit,  I greatly appreciate you taking time to fill this out.   Annitta Needs, PhD, ANP-BC [facility]

## 2017-07-24 LAB — CBC WITH DIFFERENTIAL/PLATELET
BASOS PCT: 1 %
Basophils Absolute: 59 cells/uL (ref 0–200)
EOS PCT: 1 %
Eosinophils Absolute: 59 cells/uL (ref 15–500)
HEMATOCRIT: 35.1 % (ref 35.0–45.0)
HEMOGLOBIN: 11.5 g/dL — AB (ref 11.7–15.5)
Lymphs Abs: 1876 cells/uL (ref 850–3900)
MCH: 27.8 pg (ref 27.0–33.0)
MCHC: 32.8 g/dL (ref 32.0–36.0)
MCV: 84.8 fL (ref 80.0–100.0)
MPV: 9.6 fL (ref 7.5–12.5)
Monocytes Relative: 10.3 %
NEUTROS ABS: 3298 {cells}/uL (ref 1500–7800)
Neutrophils Relative %: 55.9 %
Platelets: 332 10*3/uL (ref 140–400)
RBC: 4.14 10*6/uL (ref 3.80–5.10)
RDW: 12.7 % (ref 11.0–15.0)
Total Lymphocyte: 31.8 %
WBC: 5.9 10*3/uL (ref 3.8–10.8)
WBCMIX: 608 {cells}/uL (ref 200–950)

## 2017-07-24 LAB — IRON,TIBC AND FERRITIN PANEL
%SAT: 12 % (ref 11–50)
FERRITIN: 16 ng/mL (ref 10–232)
Iron: 42 ug/dL — ABNORMAL LOW (ref 45–160)
TIBC: 353 ug/dL (ref 250–450)

## 2017-07-27 NOTE — Progress Notes (Signed)
Hgb slightly lower than baseline. Ferritin low at 16. Iron low at 42. History of bariatric procedure. I don't believe she is on oral iron. Needs to start ferrous sulfate 325 mg po BID. Can cause black stool, constipation, possible nausea. Let me know if issues with this. Recheck CBC, iron studies in 3 months.

## 2017-07-28 ENCOUNTER — Other Ambulatory Visit: Payer: Self-pay

## 2017-07-28 DIAGNOSIS — E611 Iron deficiency: Secondary | ICD-10-CM

## 2017-08-05 ENCOUNTER — Telehealth: Payer: Self-pay

## 2017-08-05 MED ORDER — LINACLOTIDE 145 MCG PO CAPS: 145.0000 ug | ORAL_CAPSULE | Freq: Every day | ORAL | 11 refills | Status: DC

## 2017-08-05 NOTE — Telephone Encounter (Signed)
Called and informed pt.  

## 2017-08-05 NOTE — Telephone Encounter (Signed)
RX done. FYI AB.

## 2017-08-05 NOTE — Telephone Encounter (Signed)
Pt called office. AB gave her samples of Linzess 185mcg. Linzess is helping and she would like rx sent to [facility].

## 2017-09-15 ENCOUNTER — Encounter: Payer: Self-pay | Admitting: *Deleted

## 2017-09-15 ENCOUNTER — Other Ambulatory Visit: Payer: Self-pay | Admitting: *Deleted

## 2017-09-15 DIAGNOSIS — E611 Iron deficiency: Secondary | ICD-10-CM

## 2017-10-06 LAB — CBC WITH DIFFERENTIAL/PLATELET
BASOS ABS: 59 {cells}/uL (ref 0–200)
Basophils Relative: 0.9 %
EOS ABS: 79 {cells}/uL (ref 15–500)
Eosinophils Relative: 1.2 %
HCT: 40.7 % (ref 35.0–45.0)
Hemoglobin: 13.2 g/dL (ref 11.7–15.5)
Lymphs Abs: 1993 cells/uL (ref 850–3900)
MCH: 28.1 pg (ref 27.0–33.0)
MCHC: 32.4 g/dL (ref 32.0–36.0)
MCV: 86.6 fL (ref 80.0–100.0)
MPV: 9.5 fL (ref 7.5–12.5)
Monocytes Relative: 9.2 %
NEUTROS PCT: 58.5 %
Neutro Abs: 3861 cells/uL (ref 1500–7800)
Platelets: 330 10*3/uL (ref 140–400)
RBC: 4.7 10*6/uL (ref 3.80–5.10)
RDW: 14.4 % (ref 11.0–15.0)
TOTAL LYMPHOCYTE: 30.2 %
WBC mixed population: 607 cells/uL (ref 200–950)
WBC: 6.6 10*3/uL (ref 3.8–10.8)

## 2017-10-06 LAB — IRON,TIBC AND FERRITIN PANEL
%SAT: 17 % (ref 16–45)
Ferritin: 48 ng/mL (ref 16–232)
IRON: 55 ug/dL (ref 45–160)
TIBC: 320 ug/dL (ref 250–450)

## 2017-10-12 NOTE — Progress Notes (Signed)
Called, many rings and no answer.

## 2017-10-12 NOTE — Progress Notes (Signed)
Hgb improved and normal! Ferritin much improved. Can reduce iron to once daily. Recheck CBC, iron, ferritin, TIBC in 3 months.

## 2017-10-13 ENCOUNTER — Other Ambulatory Visit: Payer: Self-pay

## 2017-10-13 DIAGNOSIS — R7989 Other specified abnormal findings of blood chemistry: Secondary | ICD-10-CM

## 2017-10-13 DIAGNOSIS — R790 Abnormal level of blood mineral: Secondary | ICD-10-CM

## 2017-10-13 NOTE — Progress Notes (Signed)
Called, many rings and no answer. Will mail letter for pt to call.  Lab orders on file for 3 months.

## 2017-10-21 ENCOUNTER — Encounter: Payer: Self-pay | Admitting: Gastroenterology

## 2017-10-21 ENCOUNTER — Ambulatory Visit (INDEPENDENT_AMBULATORY_CARE_PROVIDER_SITE_OTHER): Payer: Medicare Other | Admitting: Gastroenterology

## 2017-10-21 VITALS — BP 144/92 | HR 69 | Temp 97.5°F | Ht 62.0 in | Wt 252.0 lb

## 2017-10-21 DIAGNOSIS — K5900 Constipation, unspecified: Secondary | ICD-10-CM

## 2017-10-21 DIAGNOSIS — R110 Nausea: Secondary | ICD-10-CM | POA: Diagnosis not present

## 2017-10-21 NOTE — Patient Instructions (Signed)
I am glad you are doing well!  Continue Prilosec once daily and Linzess once daily.  Keep track of when you have nausea. Let me know if you find any triggers. Avoid laying down after eating, waiting 3 hours.   It was good to see you! I will see you in 6 months!   It was a pleasure to see you today. I strive to create trusting relationships with patients to provide genuine, compassionate, and quality care. I value your feedback. If you receive a survey regarding your visit,  I greatly appreciate you taking time to fill this out.   Annitta Needs, PhD, ANP-BC [facility]

## 2017-10-21 NOTE — Progress Notes (Signed)
CC'ED TO PCP 

## 2017-10-21 NOTE — Progress Notes (Signed)
Referring Provider: Sol Passer, MD Primary Care Physician:  Sol Passer, MD Primary GI: Dr. Gala Romney   Chief Complaint  Patient presents with  . Nausea    2 episodes per week. no vomiting    HPI:   Deborah Murray is a 57 y.o. 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 Colonoscopy Nov 2018 with sigmoid diverticula, 4 mm polyp in rectum (tubular adenoma), non-bleeding internal hemorrhoids, surveillance in 7 years. EGD with normal esophagus, s/p hemigastrectomy  Constipation: Linzess 145 mcg. IDA noted with last labs. Started on oral iron BID. Recent labs with improved ferritin and Hgb. Now reduced iron to once daily.   Once or twice a week feels nauseated. Unable to pinpoint exacerbations. Happened in the morning recently. Omeprazole once daily. Her main complaint is back pain, joint pain. Quite debilitating. Patches are not working and actually causing skin to break out.   June 2019 ferritin 32, iron 138.   Past Medical History:  Diagnosis Date  . Adrenal abnormality ([place])   . Adrenal insufficiency ([place]) 2013   Tx at [facility]  . Anemia   . Arthritis   . Asthma   . Atrial fibrillation ([place])   . Chronic back pain   . COPD (chronic obstructive pulmonary disease) ([place])   . GERD (gastroesophageal reflux disease)   . Glaucoma   . Headache   . Hyperlipidemia   . Hypertension   . Hypothyroidism   . Lumbar radiculopathy   . Sepsis ([place]) 11/04/2015  . Syncope and collapse 05/11/11  adrenal insuffiency  treated at [facility]  . Thyroid disease   . Vocal cord dysfunction     Past Surgical History:  Procedure Laterality Date  . ABDOMINAL HYSTERECTOMY    . BACK SURGERY     disc   . CARDIAC CATHETERIZATION  2012 at [facility]  . CHOLECYSTECTOMY    . CHOLECYSTECTOMY, LAPAROSCOPIC    . CLOSED REDUCTION METACARPAL WITH PERCUTANEOUS PINNING Left 03/26/2016   Procedure: CLOSED REDUCTION  METACARPAL WITH PERCUTANEOUS PINNING;  Surgeon: Leanora Cover, MD;  Location: [place];  Service: Orthopedics;  Laterality: Left;  . COLONOSCOPY  2010   [place]: anal papilla, otherwise normal   . COLONOSCOPY WITH PROPOFOL N/A 03/03/2017   sigmoid divertivcula, 4 mm polyp in rectum (tubular adenoma), non-bleeding internal hemorrhoids, surveillance in 7 years  . ESOPHAGOGASTRODUODENOSCOPY N/A 09/17/2015   Surgicall altered stomach. NO specimens collected. normal esophagus  . ESOPHAGOGASTRODUODENOSCOPY (EGD) WITH PROPOFOL N/A 03/03/2017   normal esophagus  . eye surg for glaucoma    . gastic by-pass  2008   [facility]pass in [facility] at [facility]  . right knee surg     arthroscopy    Current Outpatient Medications  Medication Sig Dispense Refill  . acetaminophen (TYLENOL) 500 MG tablet Take 1,000 mg every 6 (six) hours as needed by mouth (for pain.).    Marland Kitchen albuterol (PROVENTIL HFA;VENTOLIN HFA) 108 (90 BASE) MCG/ACT inhaler Inhale 2 puffs into the lungs every 4 (four) hours as needed for wheezing.     Marland Kitchen aluminum chloride (DRYSOL) 20 % external solution Apply 1 application 2 (two) times daily as needed topically (for sweating.).     Marland Kitchen amLODipine (NORVASC) 5 MG tablet Take 10 mg by mouth daily.     Marland Kitchen atorvastatin (LIPITOR) 20 MG tablet Take 20 mg by mouth every evening.     Marland Kitchen  Calcium Carb-Ergocalciferol 500-200 MG-UNIT TABS Take 1 tablet by mouth daily.    . clindamycin (CLEOCIN T) 1 % external solution Apply 1 application at bedtime topically. Applies to underarms for sweating.    . DULoxetine (CYMBALTA) 20 MG capsule Take 40 mg by mouth daily.    Marland Kitchen EPINEPHrine (EPIPEN 2-PAK) 0.3 mg/0.3 mL IJ SOAJ injection Inject 0.3 mg daily as needed into the muscle (for anaphylatic reaction.).    Marland Kitchen fluticasone (FLONASE) 50 MCG/ACT nasal spray Place 1 spray daily into both nostrils.    . hydrocortisone (CORTEF) 20 MG tablet Take 20 mg 2 (two) times daily by mouth. Morning & Lunch    .  latanoprost (XALATAN) 0.005 % ophthalmic solution Place 1 drop into both eyes at bedtime.    Marland Kitchen levocetirizine (XYZAL) 5 MG tablet Take 5 mg every evening by mouth.    . levothyroxine (SYNTHROID, LEVOTHROID) 75 MCG tablet Take 75 mcg daily before breakfast by mouth.    . linaclotide (LINZESS) 145 MCG CAPS capsule Take 1 capsule (145 mcg total) by mouth daily before breakfast. 30 capsule 11  . Melatonin 5 MG TABS Take by mouth at bedtime.     . montelukast (SINGULAIR) 10 MG tablet Take 10 mg by mouth at bedtime.    . nitroGLYCERIN (NITROSTAT) 0.4 MG SL tablet Place 0.4 mg under the tongue every 5 (five) minutes as needed for chest pain.    Marland Kitchen omalizumab (XOLAIR) 150 MG injection Inject 300 mg into the skin every 30 (thirty) days.    Marland Kitchen omeprazole (PRILOSEC) 40 MG capsule Take 40 mg by mouth daily.    . ondansetron (ZOFRAN ODT) 4 MG disintegrating tablet Take 1 tablet (4 mg total) by mouth every 8 (eight) hours as needed. 60 tablet 3  . potassium chloride SA (K-DUR,KLOR-CON) 20 MEQ tablet Take 1 tablet (20 mEq total) by mouth 2 (two) times daily. Take two tablets three times a day for three days, then reduce to one tablet twice a day (Patient taking differently: Take 20 mEq daily by mouth. ) 72 tablet 0  . ranitidine (ZANTAC) 150 MG tablet Take 150 mg by mouth 2 (two) times daily.    . rivaroxaban (XARELTO) 20 MG TABS tablet Take 1 tablet by mouth daily.    . Teriparatide, Recombinant, (FORTEO) 600 MCG/2.4ML SOLN Inject 20 mcg at bedtime into the skin.    Marland Kitchen tiotropium (SPIRIVA HANDIHALER) 18 MCG inhalation capsule Place 1 capsule into inhaler and inhale daily.    . VOLTAREN 1 % GEL Apply 1 application 4 (four) times daily topically.     . brimonidine (ALPHAGAN) 0.2 % ophthalmic solution Place 1 drop into both eyes 2 (two) times daily.     No current facility-administered medications for this visit.     Allergies as of 10/21/2017 - Review Complete 10/21/2017  Allergen Reaction Noted  . Aspirin  Shortness Of Breath and Palpitations 05/11/2011  . Codeine sulfate Shortness Of Breath and Palpitations 05/11/2011  . Darvocet [propoxyphene n-acetaminophen] Shortness Of Breath and Palpitations 05/11/2011  . Nsaids  02/15/2013  . Tramadol Shortness Of Breath 10/30/2014  . Gabapentin Other (See Comments) 02/02/2017  . Penicillins Other (See Comments) 05/11/2011    Family History  Problem Relation Age of Onset  . Heart attack Mother   . Hypertension Mother   . Heart attack Father   . Asthma Father   . Hyperlipidemia Father   . Hypertension Father   . Heart attack Sister   . Arrhythmia Sister   .  Asthma Sister   . Hyperlipidemia Sister   . Hypertension Sister   . Asthma Brother   . Hypertension Brother   . Colon cancer Neg Hx     Social History   Socioeconomic History  . Marital status: Single    Spouse name: Not on file  . Number of children: Not on file  . Years of education: Not on file  . Highest education level: Not on file  Occupational History  . Occupation: disability   Social Needs  . Financial resource strain: Not on file  . Food insecurity:    Worry: Not on file    Inability: Not on file  . Transportation needs:    Medical: Not on file    Non-medical: Not on file  Tobacco Use  . Smoking status: Never Smoker  . Smokeless tobacco: Never Used  Substance and Sexual Activity  . Alcohol use: No  . Drug use: No  . Sexual activity: Never  Lifestyle  . Physical activity:    Days per week: Not on file    Minutes per session: Not on file  . Stress: Not on file  Relationships  . Social connections:    Talks on phone: Not on file    Gets together: Not on file    Attends religious service: Not on file    Active member of club or organization: Not on file    Attends meetings of clubs or organizations: Not on file    Relationship status: Not on file  Other Topics Concern  . Not on file  Social History Narrative  . Not on file    Review of Systems: Gen:  Denies fever, chills, anorexia. Denies fatigue, weakness, weight loss.  CV: Denies chest pain, palpitations, syncope, peripheral edema, and claudication. Resp: Denies dyspnea at rest, cough, wheezing, coughing up blood, and pleurisy. GI: see HPI  Derm: Denies rash, itching, dry skin Psych: Denies depression, anxiety, memory loss, confusion. No homicidal or suicidal ideation.  Heme: Denies bruising, bleeding, and enlarged lymph nodes.  Physical Exam: BP (!) 144/92   Pulse 69   Temp (!) 97.5 F (36.4 C) (Oral)   Ht 5\' 2"  (1.575 m)   Wt 252 lb (114.3 kg)   BMI 46.09 kg/m  General:   Alert and oriented. No distress noted. Pleasant and cooperative.  Head:  Normocephalic and atraumatic. Eyes:  Conjuctiva clear without scleral icterus. Mouth:  Oral mucosa pink and moist.  Abdomen:  +BS, soft, non-tender and non-distended. No rebound or guarding. No HSM or masses noted. Msk:  Symmetrical without gross deformities. Normal posture. Extremities:  Without edema. Neurologic:  Alert and  oriented x4 Psych:  Alert and cooperative. Normal mood and affect.

## 2017-10-21 NOTE — Assessment & Plan Note (Signed)
Noted a few times a week, unable to pinpoint triggers. No alarm signs. Continue Prilosec daily. Query related to uncontrolled GERD, possible dietary related. Keep food journal. Zofran prn. Return in 6 months.

## 2017-10-21 NOTE — Assessment & Plan Note (Signed)
Continue Linzess 145 mcg daily. Return in 6 months.  

## 2017-12-27 ENCOUNTER — Other Ambulatory Visit: Payer: Self-pay

## 2017-12-27 DIAGNOSIS — R7989 Other specified abnormal findings of blood chemistry: Secondary | ICD-10-CM

## 2017-12-27 DIAGNOSIS — R790 Abnormal level of blood mineral: Secondary | ICD-10-CM

## 2018-01-02 IMAGING — DX DG ABDOMEN 2V
2 series · 2 of 2 positions shown · non-contrast
Comparison: 11/04/2015 and CT, 11/02/2015.

CLINICAL DATA: Diffuse abdominal pain for 1 week.

EXAM:
ABDOMEN - 2 VIEW

[abdomen erect]
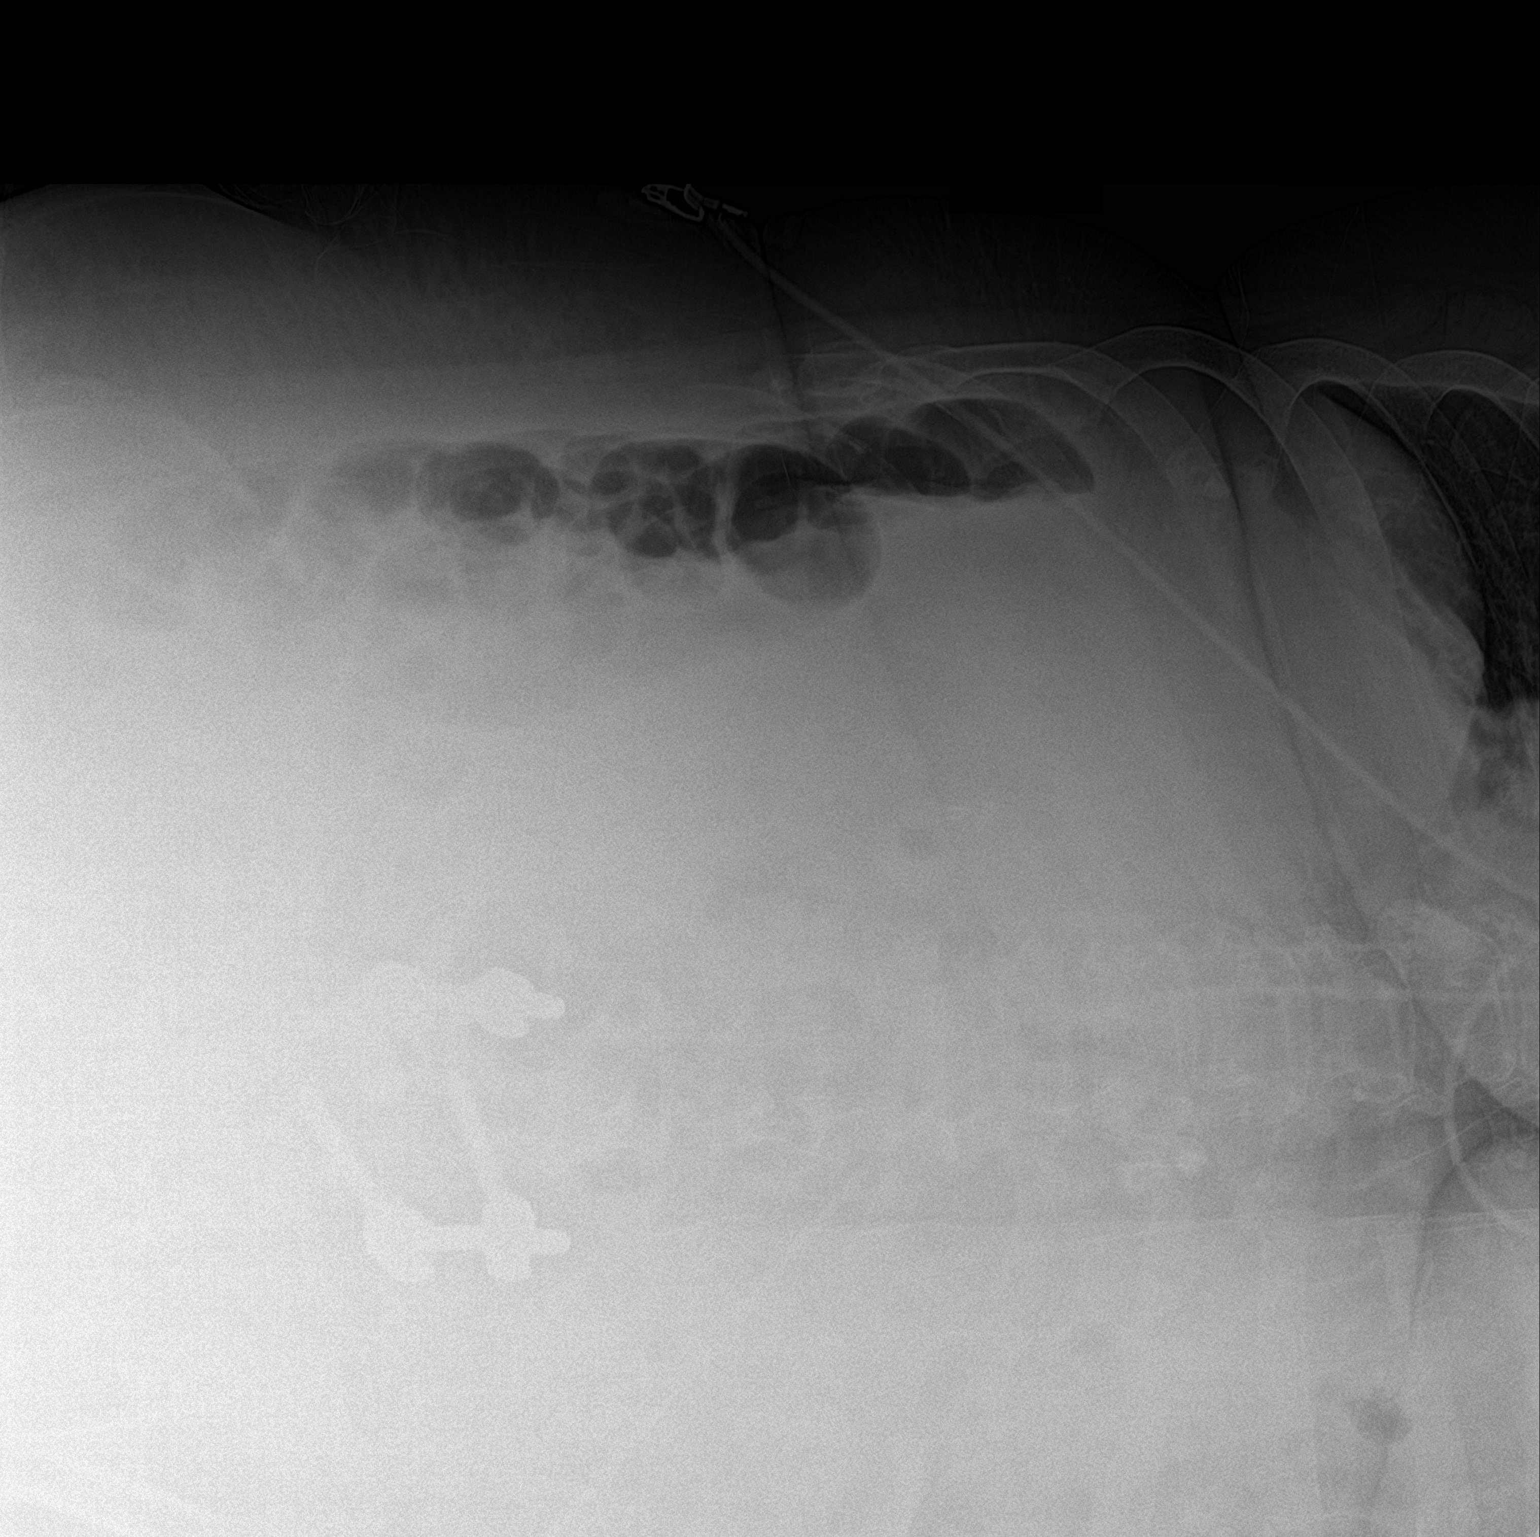

[abdomen supine]
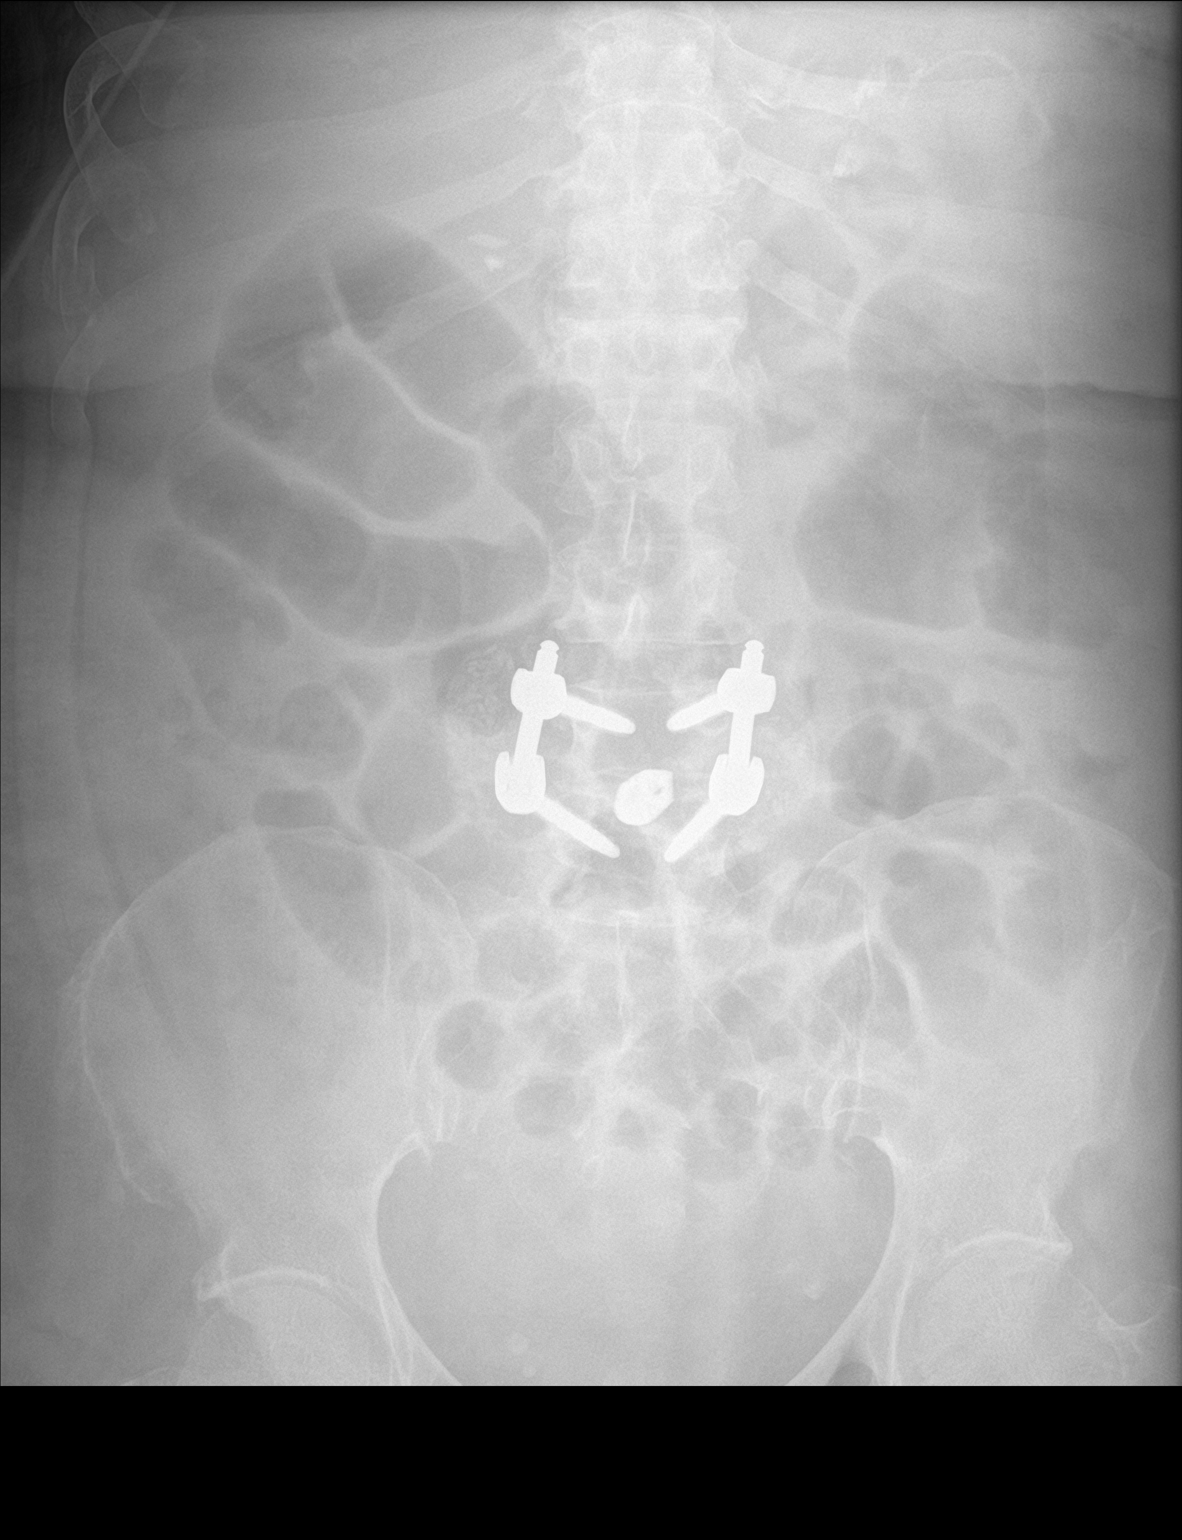

[2 of 2 positions shown; findings below may reference images not displayed]

FINDINGS: Bowel small bowel prominence is noted, without air-fluid levels
noted on the decubitus view. There is no evidence of obstruction.
There is no convincing free air.

Clips are upper quadrant reflect a prior cholecystectomy. There
changes from a previous lower lumbar spine posterior fusion.
IMPRESSION: 1. No evidence of bowel obstruction, significant adynamic ileus or
free air.

## 2018-01-18 LAB — CBC WITH DIFFERENTIAL/PLATELET
BASOS ABS: 62 {cells}/uL (ref 0–200)
Basophils Relative: 1 %
Eosinophils Absolute: 37 cells/uL (ref 15–500)
Eosinophils Relative: 0.6 %
HCT: 36.3 % (ref 35.0–45.0)
HEMOGLOBIN: 11.9 g/dL (ref 11.7–15.5)
Lymphs Abs: 1717 cells/uL (ref 850–3900)
MCH: 28.9 pg (ref 27.0–33.0)
MCHC: 32.8 g/dL (ref 32.0–36.0)
MCV: 88.1 fL (ref 80.0–100.0)
MONOS PCT: 8.5 %
MPV: 9.7 fL (ref 7.5–12.5)
NEUTROS ABS: 3856 {cells}/uL (ref 1500–7800)
Neutrophils Relative %: 62.2 %
Platelets: 310 10*3/uL (ref 140–400)
RBC: 4.12 10*6/uL (ref 3.80–5.10)
RDW: 12.9 % (ref 11.0–15.0)
TOTAL LYMPHOCYTE: 27.7 %
WBC: 6.2 10*3/uL (ref 3.8–10.8)
WBCMIX: 527 {cells}/uL (ref 200–950)

## 2018-01-18 LAB — IRON,TIBC AND FERRITIN PANEL
%SAT: 20 % (calc) (ref 16–45)
Ferritin: 66 ng/mL (ref 16–232)
Iron: 60 ug/dL (ref 45–160)
TIBC: 299 mcg/dL (calc) (ref 250–450)

## 2018-01-19 NOTE — Progress Notes (Signed)
Hgb stable, ferritin improved. Continue iron once daily. CBC, iron studies in 3 months.

## 2018-01-24 ENCOUNTER — Telehealth: Payer: Self-pay | Admitting: Gastroenterology

## 2018-01-24 ENCOUNTER — Other Ambulatory Visit: Payer: Self-pay

## 2018-01-24 DIAGNOSIS — R7989 Other specified abnormal findings of blood chemistry: Secondary | ICD-10-CM

## 2018-01-24 NOTE — Telephone Encounter (Signed)
[place] SOMEONE CALLED HER Friday WITH, SHE THINKS, RESULTS

## 2018-01-24 NOTE — Progress Notes (Signed)
Pt is aware and lab orders on file for Jan 2020.

## 2018-01-24 NOTE — Telephone Encounter (Signed)
See lab results, I have spoken to pt this am.

## 2018-03-14 ENCOUNTER — Other Ambulatory Visit: Payer: Self-pay

## 2018-03-14 DIAGNOSIS — R7989 Other specified abnormal findings of blood chemistry: Secondary | ICD-10-CM

## 2018-04-25 ENCOUNTER — Ambulatory Visit (INDEPENDENT_AMBULATORY_CARE_PROVIDER_SITE_OTHER): Payer: Medicare Other | Admitting: Gastroenterology

## 2018-04-25 ENCOUNTER — Encounter: Payer: Self-pay | Admitting: Gastroenterology

## 2018-04-25 VITALS — BP 124/78 | HR 66 | Temp 97.0°F | Ht 62.0 in | Wt 252.8 lb

## 2018-04-25 DIAGNOSIS — K5900 Constipation, unspecified: Secondary | ICD-10-CM | POA: Diagnosis not present

## 2018-04-25 DIAGNOSIS — K219 Gastro-esophageal reflux disease without esophagitis: Secondary | ICD-10-CM

## 2018-04-25 MED ORDER — OMEPRAZOLE 20 MG PO CPDR: 20.0000 mg | DELAYED_RELEASE_CAPSULE | Freq: Every day | ORAL | 3 refills | Status: AC

## 2018-04-25 NOTE — Patient Instructions (Signed)
I have decreased Prilosec to 20 milligrams once each morning, 30 minutes before breakfast. We want to keep you on the lowest effective dose for reflux due to history of Cdiff.   I am glad Linzess is working well for you!  I will let you know the results of the blood work when it comes available.  I will see you in 6-8 months!  It is always so good to see you. Please let me know if you need anything!

## 2018-04-25 NOTE — Assessment & Plan Note (Signed)
Continue Linzess 145 mcg daily. Return in 6-8 months.

## 2018-04-25 NOTE — Assessment & Plan Note (Signed)
History of CDI requiring fecal transplant in the past, and ideally would avoid PPIs indefinitely. However, she has symptomatic GERD. Discussed reducing to lowest possible dosing. Changing Prilosec 40 mg to 20 mg daily. GERD diet/behaviors. Return in 6-8 months. Will follow labs pending.

## 2018-04-25 NOTE — Progress Notes (Signed)
Referring Provider: Sol Passer, MD Primary Care Physician:  Sol Passer, MD  Primary GI: Dr. Gala Romney   Chief Complaint  Patient presents with  . Constipation    doing ok    HPI:   Deborah Murray is a 58 y.o. 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 Colonoscopy Nov 2018 with sigmoid diverticula, 4 mm polyp in rectum (tubular adenoma), non-bleeding internal hemorrhoids, surveillance in 7 years. EGD with normal esophagus, s/p hemigastrectomy.  Constipation: Linzess 145 mcg. IDA noted several months ago, now on iron once daily.  Recent labs with improved ferritin and Hgb. She had labs rechecked today, which we will follow. Now reduced iron to once daily. On omeprazole 40 mg daily. GERD intermittently. No diarrhea. Needs knee and back surgery at some point in the future.   Past Medical History:  Diagnosis Date  . Adrenal abnormality ([place])   . Adrenal insufficiency ([place]) 2013   Tx at [facility]  . Anemia   . Arthritis   . Asthma   . Atrial fibrillation ([place])   . Chronic back pain   . COPD (chronic obstructive pulmonary disease) ([place])   . GERD (gastroesophageal reflux disease)   . Glaucoma   . Headache   . Hyperlipidemia   . Hypertension   . Hypothyroidism   . Lumbar radiculopathy   . Sepsis ([place]) 11/04/2015  . Syncope and collapse 05/11/11  adrenal insuffiency  treated at [facility]  . Thyroid disease   . Vocal cord dysfunction     Past Surgical History:  Procedure Laterality Date  . ABDOMINAL HYSTERECTOMY    . BACK SURGERY     disc   . CARDIAC CATHETERIZATION  2012 at [facility]  . CHOLECYSTECTOMY    . CHOLECYSTECTOMY, LAPAROSCOPIC    . CLOSED REDUCTION METACARPAL WITH PERCUTANEOUS PINNING Left 03/26/2016   Procedure: CLOSED REDUCTION METACARPAL WITH PERCUTANEOUS PINNING;  Surgeon: Leanora Cover, MD;  Location: [place];  Service: Orthopedics;  Laterality:  Left;  . COLONOSCOPY  2010   [place]: anal papilla, otherwise normal   . COLONOSCOPY WITH PROPOFOL N/A 03/03/2017   sigmoid divertivcula, 4 mm polyp in rectum (tubular adenoma), non-bleeding internal hemorrhoids, surveillance in 7 years  . ESOPHAGOGASTRODUODENOSCOPY N/A 09/17/2015   Surgicall altered stomach. NO specimens collected. normal esophagus  . ESOPHAGOGASTRODUODENOSCOPY (EGD) WITH PROPOFOL N/A 03/03/2017   normal esophagus  . eye surg for glaucoma    . gastic by-pass  2008   Gastric by-pass in 2008 at [facility]  . right knee surg     arthroscopy    Current Outpatient Medications  Medication Sig Dispense Refill  . acetaminophen (TYLENOL) 500 MG tablet Take 1,000 mg every 6 (six) hours as needed by mouth (for pain.).    Marland Kitchen albuterol (PROVENTIL HFA;VENTOLIN HFA) 108 (90 BASE) MCG/ACT inhaler Inhale 2 puffs into the lungs every 4 (four) hours as needed for wheezing.     Marland Kitchen aluminum chloride (DRYSOL) 20 % external solution Apply 1 application 2 (two) times daily as needed topically (for sweating.).     Marland Kitchen amLODipine (NORVASC) 5 MG tablet Take 10 mg by mouth daily.     Marland Kitchen atorvastatin (LIPITOR) 20 MG tablet Take 20 mg by mouth every evening.     . Calcium Carb-Ergocalciferol 500-200 MG-UNIT TABS Take 1 tablet by mouth daily.    . clindamycin (CLEOCIN T) 1 % external solution  Apply 1 application at bedtime topically. Applies to underarms for sweating.    . DULoxetine (CYMBALTA) 20 MG capsule Take 40 mg by mouth daily.    Marland Kitchen EPINEPHrine (EPIPEN 2-PAK) 0.3 mg/0.3 mL IJ SOAJ injection Inject 0.3 mg daily as needed into the muscle (for anaphylatic reaction.).    Marland Kitchen fluticasone (FLONASE) 50 MCG/ACT nasal spray Place 1 spray daily into both nostrils.    . hydrocortisone (CORTEF) 20 MG tablet Take 20 mg 2 (two) times daily by mouth. Morning & Lunch    . latanoprost (XALATAN) 0.005 % ophthalmic solution Place 1 drop into both eyes at bedtime.    Marland Kitchen levocetirizine (XYZAL) 5 MG tablet Take 5 mg  every evening by mouth.    . levothyroxine (SYNTHROID, LEVOTHROID) 75 MCG tablet Take 75 mcg daily before breakfast by mouth.    . linaclotide (LINZESS) 145 MCG CAPS capsule Take 1 capsule (145 mcg total) by mouth daily before breakfast. 30 capsule 11  . Melatonin 5 MG TABS Take by mouth at bedtime.     . montelukast (SINGULAIR) 10 MG tablet Take 10 mg by mouth at bedtime.    . nitroGLYCERIN (NITROSTAT) 0.4 MG SL tablet Place 0.4 mg under the tongue every 5 (five) minutes as needed for chest pain.    Marland Kitchen omalizumab (XOLAIR) 150 MG injection Inject 300 mg into the skin every 30 (thirty) days.    Marland Kitchen omeprazole (PRILOSEC) 40 MG capsule Take 40 mg by mouth daily.    . ondansetron (ZOFRAN ODT) 4 MG disintegrating tablet Take 1 tablet (4 mg total) by mouth every 8 (eight) hours as needed. 60 tablet 3  . potassium chloride SA (K-DUR,KLOR-CON) 20 MEQ tablet Take 1 tablet (20 mEq total) by mouth 2 (two) times daily. Take two tablets three times a day for three days, then reduce to one tablet twice a day (Patient taking differently: Take 20 mEq daily by mouth. ) 72 tablet 0  . rivaroxaban (XARELTO) 20 MG TABS tablet Take 1 tablet by mouth daily.    . Teriparatide, Recombinant, (FORTEO) 600 MCG/2.4ML SOLN Inject 20 mcg at bedtime into the skin.    Marland Kitchen tiotropium (SPIRIVA HANDIHALER) 18 MCG inhalation capsule Place 1 capsule into inhaler and inhale daily.    . VOLTAREN 1 % GEL Apply 1 application 4 (four) times daily topically.     . ranitidine (ZANTAC) 150 MG tablet Take 150 mg by mouth 2 (two) times daily.     No current facility-administered medications for this visit.     Allergies as of 04/25/2018 - Review Complete 04/25/2018  Allergen Reaction Noted  . Aspirin Shortness Of Breath and Palpitations 05/11/2011  . Codeine sulfate Shortness Of Breath and Palpitations 05/11/2011  . Darvocet [propoxyphene n-acetaminophen] Shortness Of Breath and Palpitations 05/11/2011  . Nsaids  02/15/2013  . Tramadol  Shortness Of Breath 10/30/2014  . Gabapentin Other (See Comments) 02/02/2017  . Penicillins Other (See Comments) 05/11/2011    Family History  Problem Relation Age of Onset  . Heart attack Mother   . Hypertension Mother   . Heart attack Father   . Asthma Father   . Hyperlipidemia Father   . Hypertension Father   . Heart attack Sister   . Arrhythmia Sister   . Asthma Sister   . Hyperlipidemia Sister   . Hypertension Sister   . Asthma Brother   . Hypertension Brother   . Colon cancer Neg Hx     Social History   Socioeconomic History  .  Marital status: Single    Spouse name: Not on file  . Number of children: Not on file  . Years of education: Not on file  . Highest education level: Not on file  Occupational History  . Occupation: disability   Social Needs  . Financial resource strain: Not on file  . Food insecurity:    Worry: Not on file    Inability: Not on file  . Transportation needs:    Medical: Not on file    Non-medical: Not on file  Tobacco Use  . Smoking status: Never Smoker  . Smokeless tobacco: Never Used  Substance and Sexual Activity  . Alcohol use: No  . Drug use: No  . Sexual activity: Never  Lifestyle  . Physical activity:    Days per week: Not on file    Minutes per session: Not on file  . Stress: Not on file  Relationships  . Social connections:    Talks on phone: Not on file    Gets together: Not on file    Attends religious service: Not on file    Active member of club or organization: Not on file    Attends meetings of clubs or organizations: Not on file    Relationship status: Not on file  Other Topics Concern  . Not on file  Social History Narrative  . Not on file    Review of Systems: Gen: Denies fever, chills, anorexia. Denies fatigue, weakness, weight loss.  CV: Denies chest pain, palpitations, syncope, peripheral edema, and claudication. Resp: Denies dyspnea at rest, cough, wheezing, coughing up blood, and pleurisy. GI:  see HPI  Derm: Denies rash, itching, dry skin Psych: Denies depression, anxiety, memory loss, confusion. No homicidal or suicidal ideation.  Heme: Denies bruising, bleeding, and enlarged lymph nodes.  Physical Exam: BP 124/78   Pulse 66   Temp (!) 97 F (36.1 C) (Oral)   Ht 5\' 2"  (1.575 m)   Wt 252 lb 12.8 oz (114.7 kg)   BMI 46.24 kg/m  General:   Alert and oriented. No distress noted. Pleasant and cooperative.  Head:  Normocephalic and atraumatic. Eyes:  Conjuctiva clear without scleral icterus. Mouth:  Oral mucosa pink and moist.  Abdomen:  +BS, soft, non-tender and non-distended. No rebound or guarding. No HSM or masses noted. Extremities:  Without edema. Neurologic:  Alert and  oriented x4 Psych:  Alert and cooperative. Normal mood and affect.

## 2018-04-25 NOTE — Progress Notes (Signed)
CC'D TO PCP °

## 2018-04-26 LAB — CBC WITH DIFFERENTIAL/PLATELET
Absolute Monocytes: 670 cells/uL (ref 200–950)
Basophils Absolute: 68 cells/uL (ref 0–200)
Basophils Relative: 1.1 %
Eosinophils Absolute: 37 cells/uL (ref 15–500)
Eosinophils Relative: 0.6 %
HCT: 38.1 % (ref 35.0–45.0)
HEMOGLOBIN: 12.6 g/dL (ref 11.7–15.5)
Lymphs Abs: 2306 cells/uL (ref 850–3900)
MCH: 29.4 pg (ref 27.0–33.0)
MCHC: 33.1 g/dL (ref 32.0–36.0)
MCV: 89 fL (ref 80.0–100.0)
MPV: 9.5 fL (ref 7.5–12.5)
Monocytes Relative: 10.8 %
NEUTROS ABS: 3119 {cells}/uL (ref 1500–7800)
Neutrophils Relative %: 50.3 %
Platelets: 323 10*3/uL (ref 140–400)
RBC: 4.28 10*6/uL (ref 3.80–5.10)
RDW: 12.7 % (ref 11.0–15.0)
Total Lymphocyte: 37.2 %
WBC: 6.2 10*3/uL (ref 3.8–10.8)

## 2018-04-26 LAB — IRON,TIBC AND FERRITIN PANEL
%SAT: 28 % (calc) (ref 16–45)
FERRITIN: 69 ng/mL (ref 16–232)
Iron: 85 ug/dL (ref 45–160)
TIBC: 305 mcg/dL (calc) (ref 250–450)

## 2018-04-28 NOTE — Progress Notes (Signed)
Ferritin stable, Hgb improved. Continue with iron orally once daily.
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
## 2018-08-17 ENCOUNTER — Other Ambulatory Visit: Payer: Self-pay | Admitting: Gastroenterology

## 2018-09-28 ENCOUNTER — Other Ambulatory Visit: Payer: Self-pay | Admitting: Gastroenterology

## 2018-11-21 NOTE — Progress Notes (Signed)
Primary Care Physician:  Sol Passer, MD Primary GI:   Chief Complaint  Patient presents with  . Gastroesophageal Reflux    doing well    HPI:   Deborah Murray is a 58 y.o. 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 Colonoscopy Nov 2018 with sigmoid diverticula, 4 mm polyp in rectum (tubular adenoma), non-bleeding internal hemorrhoids, surveillance in 7 years. EGD with normal esophagus, s/p hemigastrectomy.   Constipation: Linzess 145 mcg daily. Doing well with this. No rectal bleeding.   GERD: Omeprazole 20 mg daily. No dysphagia. Controls GERD symptoms.   Random LLQ.groin pain that bends her over. Present since after last visit in Jan 2020. Movement can precipitate. Sometimes will come up on its own. Feels like a big knot. Goes away after rubbing for awhile. No fever or chills. No constipation or diarrhea. Known ventral hernia. CT last year.   Past Medical History:  Diagnosis Date  . Adrenal abnormality ([place])   . Adrenal insufficiency ([place]) 2013   Tx at [facility]  . Anemia   . Arthritis   . Asthma   . Atrial fibrillation ([place])   . Chronic back pain   . COPD (chronic obstructive pulmonary disease) ([place])   . GERD (gastroesophageal reflux disease)   . Glaucoma   . Headache   . Hyperlipidemia   . Hypertension   . Hypothyroidism   . Lumbar radiculopathy   . Sepsis ([place]) 11/04/2015  . Syncope and collapse 05/11/11  adrenal insuffiency  treated at [facility]  . Thyroid disease   . Vocal cord dysfunction     Past Surgical History:  Procedure Laterality Date  . ABDOMINAL HYSTERECTOMY    . BACK SURGERY     disc   . CARDIAC CATHETERIZATION  2012 at [facility]  . CHOLECYSTECTOMY    . CHOLECYSTECTOMY, LAPAROSCOPIC    . CLOSED REDUCTION METACARPAL WITH PERCUTANEOUS PINNING Left 03/26/2016   Procedure: CLOSED REDUCTION METACARPAL WITH PERCUTANEOUS  PINNING;  Surgeon: Leanora Cover, MD;  Location: [facility];  Service: Orthopedics;  Laterality: Left;  . COLONOSCOPY  2010   [place]: anal papilla, otherwise normal   . COLONOSCOPY WITH PROPOFOL N/A 03/03/2017   sigmoid divertivcula, 4 mm polyp in rectum (tubular adenoma), non-bleeding internal hemorrhoids, surveillance in 7 years  . ESOPHAGOGASTRODUODENOSCOPY N/A 09/17/2015   Surgicall altered stomach. NO specimens collected. normal esophagus  . ESOPHAGOGASTRODUODENOSCOPY (EGD) WITH PROPOFOL N/A 03/03/2017   normal esophagus  . eye surg for glaucoma    . gastic by-pass  2008   Gastric by-pass in [facility] at [facility]  . right knee surg     arthroscopy    Current Outpatient Medications  Medication Sig Dispense Refill  . acetaminophen (TYLENOL) 500 MG tablet Take 1,000 mg every 6 (six) hours as needed by mouth (for pain.).    Marland Kitchen albuterol (PROVENTIL HFA;VENTOLIN HFA) 108 (90 BASE) MCG/ACT inhaler Inhale 2 puffs into the lungs every 4 (four) hours as needed for wheezing.     Marland Kitchen aluminum chloride (DRYSOL) 20 % external solution Apply 1 application 2 (two) times daily as needed topically (for sweating.).     Marland Kitchen amLODipine (NORVASC) 5 MG tablet Take 10 mg by mouth daily.     Marland Kitchen atorvastatin (LIPITOR) 20 MG tablet Take 20 mg by mouth every evening.     . Calcium Carb-Ergocalciferol 500-200 MG-UNIT  TABS Take 1 tablet by mouth daily.    . clindamycin (CLEOCIN T) 1 % external solution Apply 1 application at bedtime topically. Applies to underarms for sweating.    . DULoxetine (CYMBALTA) 20 MG capsule Take 40 mg by mouth daily.    . fluticasone (FLONASE) 50 MCG/ACT nasal spray Place 1 spray daily into both nostrils.    . hydrocortisone (CORTEF) 20 MG tablet Take 20 mg 2 (two) times daily by mouth. Morning & Lunch    . latanoprost (XALATAN) 0.005 % ophthalmic solution Place 1 drop into both eyes at bedtime.    Marland Kitchen levocetirizine (XYZAL) 5 MG tablet Take 5 mg every evening by mouth.    .  levothyroxine (SYNTHROID, LEVOTHROID) 75 MCG tablet Take 75 mcg daily before breakfast by mouth.    Marland Kitchen LINZESS 145 MCG CAPS capsule TAKE ONE CAPSULE BY MOUTH DAILY BEFORE BREAKFAST. 30 capsule 11  . Melatonin 5 MG TABS Take by mouth at bedtime.     . montelukast (SINGULAIR) 10 MG tablet Take 10 mg by mouth at bedtime.    Marland Kitchen omalizumab (XOLAIR) 150 MG injection Inject 300 mg into the skin every 30 (thirty) days.    Marland Kitchen omeprazole (PRILOSEC) 20 MG capsule Take 1 capsule (20 mg total) by mouth daily. 30 minutes before breakfast. 90 capsule 3  . ondansetron (ZOFRAN-ODT) 4 MG disintegrating tablet TAKE 1 TABLET BY MOUTH EVERY 8 HOURS AS NEEDED. 60 tablet 0  . potassium chloride SA (K-DUR,KLOR-CON) 20 MEQ tablet Take 1 tablet (20 mEq total) by mouth 2 (two) times daily. Take two tablets three times a day for three days, then reduce to one tablet twice a day (Patient taking differently: Take 20 mEq by mouth daily. Pt taking 20 meq  Once a day) 72 tablet 0  . rivaroxaban (XARELTO) 20 MG TABS tablet Take 1 tablet by mouth daily.    Marland Kitchen tiotropium (SPIRIVA HANDIHALER) 18 MCG inhalation capsule Place 1 capsule into inhaler and inhale daily.    . VOLTAREN 1 % GEL Apply 1 application 4 (four) times daily topically.     . ranitidine (ZANTAC) 150 MG tablet Take 150 mg by mouth 2 (two) times daily.    . Teriparatide, Recombinant, (FORTEO) 600 MCG/2.4ML SOLN Inject 20 mcg at bedtime into the skin.     No current facility-administered medications for this visit.     Allergies as of 11/22/2018 - Review Complete 11/22/2018  Allergen Reaction Noted  . Aspirin Shortness Of Breath and Palpitations 05/11/2011  . Codeine sulfate Shortness Of Breath and Palpitations 05/11/2011  . Darvocet [propoxyphene n-acetaminophen] Shortness Of Breath and Palpitations 05/11/2011  . Nsaids  02/15/2013  . Tramadol Shortness Of Breath 10/30/2014  . Gabapentin Other (See Comments) 02/02/2017  . Penicillins Other (See Comments) 05/11/2011     Family History  Problem Relation Age of Onset  . Heart attack Mother   . Hypertension Mother   . Heart attack Father   . Asthma Father   . Hyperlipidemia Father   . Hypertension Father   . Heart attack Sister   . Arrhythmia Sister   . Asthma Sister   . Hyperlipidemia Sister   . Hypertension Sister   . Asthma Brother   . Hypertension Brother   . Colon cancer Neg Hx     Social History   Socioeconomic History  . Marital status: Single    Spouse name: Not on file  . Number of children: Not on file  . Years of education: Not on  file  . Highest education level: Not on file  Occupational History  . Occupation: disability   Social Needs  . Financial resource strain: Not on file  . Food insecurity    Worry: Not on file    Inability: Not on file  . Transportation needs    Medical: Not on file    Non-medical: Not on file  Tobacco Use  . Smoking status: Never Smoker  . Smokeless tobacco: Never Used  Substance and Sexual Activity  . Alcohol use: No  . Drug use: No  . Sexual activity: Never  Lifestyle  . Physical activity    Days per week: Not on file    Minutes per session: Not on file  . Stress: Not on file  Relationships  . Social Herbalist on phone: Not on file    Gets together: Not on file    Attends religious service: Not on file    Active member of club or organization: Not on file    Attends meetings of clubs or organizations: Not on file    Relationship status: Not on file  Other Topics Concern  . Not on file  Social History Narrative  . Not on file    Review of Systems: Gen: Denies fever, chills, anorexia. Denies fatigue, weakness, weight loss.  CV: Denies chest pain, palpitations, syncope, peripheral edema, and claudication. Resp: Denies dyspnea at rest, cough, wheezing, coughing up blood, and pleurisy. GI: see HPI Derm: Denies rash, itching, dry skin Psych: Denies depression, anxiety, memory loss, confusion. No homicidal or suicidal  ideation.  Heme: Denies bruising, bleeding, and enlarged lymph nodes.  Physical Exam: BP (!) 140/94   Pulse (!) 56   Temp (!) 96.9 F (36.1 C) (Temporal)   Ht 5\' 2"  (1.575 m)   Wt 249 lb (112.9 kg)   BMI 45.54 kg/m  General:   Alert and oriented. No distress noted. Pleasant and cooperative.  Head:  Normocephalic and atraumatic. Eyes:  Conjuctiva clear without scleral icterus. Mouth:  Oral mucosa pink and moist.  Abdomen:  +BS, soft, non-tender and non-distended. No rebound or guarding. No HSM or masses noted. Prior laparoscopy port sites well-healed. No TTP. Larger AP diameter makes hernia appreciation difficult.  Extremities:  Without edema. Neurologic:  Alert and  oriented x4 Psych:  Alert and cooperative. Normal mood and affect.

## 2018-11-22 ENCOUNTER — Ambulatory Visit (INDEPENDENT_AMBULATORY_CARE_PROVIDER_SITE_OTHER): Payer: Medicare Other | Admitting: Gastroenterology

## 2018-11-22 ENCOUNTER — Other Ambulatory Visit: Payer: Self-pay

## 2018-11-22 ENCOUNTER — Encounter: Payer: Self-pay | Admitting: Gastroenterology

## 2018-11-22 VITALS — BP 140/94 | HR 56 | Temp 96.9°F | Ht 62.0 in | Wt 249.0 lb

## 2018-11-22 DIAGNOSIS — R1032 Left lower quadrant pain: Secondary | ICD-10-CM

## 2018-11-22 DIAGNOSIS — K219 Gastro-esophageal reflux disease without esophagitis: Secondary | ICD-10-CM | POA: Diagnosis not present

## 2018-11-22 DIAGNOSIS — R110 Nausea: Secondary | ICD-10-CM | POA: Diagnosis not present

## 2018-11-22 DIAGNOSIS — K5900 Constipation, unspecified: Secondary | ICD-10-CM

## 2018-11-22 DIAGNOSIS — R790 Abnormal level of blood mineral: Secondary | ICD-10-CM

## 2018-11-22 NOTE — Assessment & Plan Note (Signed)
Vague symptoms. Stop oral iron as may be contributing. Query dietary-related. Keep food journal. She also notes some improvement with eating. No known history of diabetes.

## 2018-11-22 NOTE — Assessment & Plan Note (Signed)
Continue Linzess 145 mcg daily

## 2018-11-22 NOTE — Assessment & Plan Note (Signed)
Continue omeprazole once daily 

## 2018-11-22 NOTE — Patient Instructions (Signed)
Please have blood work today.  Keep track of when nausea occurs and what happened prior to it, what foods you were eating, etc. Let me know if you see a trend! Let's stop the oral iron for now.  We are referring you to General Surgery to discuss possible hernia.  I will see you in about 6 months!  It's always so good to see you!  Annitta Needs, PhD, ANP-BC [facility]

## 2018-11-22 NOTE — Assessment & Plan Note (Signed)
Several month history of intermittent LLQ pain mostly exacerbated by movement but can occur at rest, with reports of feeling a "knot" and improved with rest and massage. Midline ventral hernia on CT last year. Abdominal exam with well-healed laparoscopic port sites from prior abdominal surgeries. With prior abdominal surgeries, query port site hernia in left lower abdomen. Difficult to assess due to larger AP diameter. Holding off on CT now and will refer to surgery for evaluation.

## 2018-11-23 LAB — CBC WITH DIFFERENTIAL/PLATELET
Absolute Monocytes: 588 cells/uL (ref 200–950)
Basophils Absolute: 88 cells/uL (ref 0–200)
Basophils Relative: 1.7 %
Eosinophils Absolute: 99 cells/uL (ref 15–500)
Eosinophils Relative: 1.9 %
HCT: 38.1 % (ref 35.0–45.0)
Hemoglobin: 12.3 g/dL (ref 11.7–15.5)
Lymphs Abs: 1898 cells/uL (ref 850–3900)
MCH: 29.6 pg (ref 27.0–33.0)
MCHC: 32.3 g/dL (ref 32.0–36.0)
MCV: 91.8 fL (ref 80.0–100.0)
MPV: 9.5 fL (ref 7.5–12.5)
Monocytes Relative: 11.3 %
Neutro Abs: 2527 cells/uL (ref 1500–7800)
Neutrophils Relative %: 48.6 %
Platelets: 271 10*3/uL (ref 140–400)
RBC: 4.15 10*6/uL (ref 3.80–5.10)
RDW: 13.4 % (ref 11.0–15.0)
Total Lymphocyte: 36.5 %
WBC: 5.2 10*3/uL (ref 3.8–10.8)

## 2018-11-23 LAB — COMPLETE METABOLIC PANEL WITH GFR
AG Ratio: 1.9 (calc) (ref 1.0–2.5)
ALT: 17 U/L (ref 6–29)
AST: 19 U/L (ref 10–35)
Albumin: 3.7 g/dL (ref 3.6–5.1)
Alkaline phosphatase (APISO): 52 U/L (ref 37–153)
BUN/Creatinine Ratio: 15 (calc) (ref 6–22)
BUN: 19 mg/dL (ref 7–25)
CO2: 27 mmol/L (ref 20–32)
Calcium: 9.1 mg/dL (ref 8.6–10.4)
Chloride: 112 mmol/L — ABNORMAL HIGH (ref 98–110)
Creat: 1.26 mg/dL — ABNORMAL HIGH (ref 0.50–0.99)
GFR, Est African American: 54 mL/min/{1.73_m2} — ABNORMAL LOW (ref >=60)
GFR, Est Non African American: 46 mL/min/{1.73_m2} — ABNORMAL LOW (ref >=60)
Globulin: 1.9 g/dL (calc) (ref 1.9–3.7)
Glucose, Bld: 72 mg/dL (ref 65–139)
Potassium: 4.3 mmol/L (ref 3.5–5.3)
Sodium: 145 mmol/L (ref 135–146)
Total Bilirubin: 0.4 mg/dL (ref 0.2–1.2)
Total Protein: 5.6 g/dL — ABNORMAL LOW (ref 6.1–8.1)

## 2018-11-23 LAB — IRON,TIBC AND FERRITIN PANEL
%SAT: 33 % (calc) (ref 16–45)
Ferritin: 61 ng/mL (ref 16–232)
Iron: 98 ug/dL (ref 45–160)
TIBC: 294 mcg/dL (calc) (ref 250–450)

## 2018-11-28 NOTE — Progress Notes (Signed)
CBC, iron studies, and CMP reviewed. Hgb overall stable, ferritin stable. Creatinine slightly higher from trend over past few years. LFTs normal. Please send to PCP.

## 2018-11-29 NOTE — Progress Notes (Signed)
CC'D TO PCP °

## 2018-11-30 ENCOUNTER — Telehealth: Payer: Self-pay | Admitting: Internal Medicine

## 2018-11-30 NOTE — Telephone Encounter (Signed)
Pt was calling to see if her lab results were back yet. [phone]

## 2018-11-30 NOTE — Telephone Encounter (Signed)
See documentation in result note.  

## 2018-12-07 ENCOUNTER — Other Ambulatory Visit: Payer: Self-pay | Admitting: *Deleted

## 2018-12-07 DIAGNOSIS — R1010 Upper abdominal pain, unspecified: Secondary | ICD-10-CM

## 2018-12-09 ENCOUNTER — Ambulatory Visit: Payer: Self-pay | Admitting: Surgery

## 2018-12-09 NOTE — H&P (Signed)
Deborah Murray Documented: 12/09/2018 11:26 AM Location: [facility] Patient #: [id number] DOB: [birth year] Single / Language: [name] / Race: Black or African American Female  History of Present Illness Marcello Moores A. Talani Brazee MD; 12/09/2018 12:16 PM) Patient words: Patient presents for evaluation of supraumbilical incisional hernia. She has history of gastric bypass done laparoscopically in [place] 10 years ago. Unfortunately she has not lost much weight from that. She's had a long-standing history of pain and bulge just above her umbilicus at the port site. CT last year showed a small incisional hernia at the port site with fatty tissue. No signs of obstruction. The area causes pain when she coughs and walks. It's been about the same size. Locations just above the umbilicus. No change in bowel or bladder function.  The patient is a 58 year old female.   Allergies (Tanisha A. Owens Shark, [place]; 12/09/2018 11:27 AM) Penicillins Aspirin *ANALGESICS - NonNarcotic* Darvocet A500 *ANALGESICS - OPIOID* Allergies Reconciled  Medication History (Tanisha A. Owens Shark, [place]; 12/09/2018 11:32 AM) Albuterol Sulfate HFA (108 (90 Base)MCG/ACT Aerosol Soln, Inhalation) Active. amLODIPine Besylate (5MG  Tablet, Oral) Active. Atorvastatin Calcium (20MG  Tablet, Oral) Active. Calcium (500MG  Tablet, Oral) Active. Cymbalta (20MG  Capsule DR Part, Oral) Active. Flonase (50MCG/DOSE Inhaler, Nasal) Active. Cortef (20MG  Tablet, Oral) Active. Xyzal (5MG  Tablet, Oral) Active. Levothyroxine Sodium (75MCG Tablet, Oral) Active. Linzess (145MCG Capsule, Oral) Active. Melatonin (Oral) Specific strength unknown - Active. Singulair (10MG  Tablet, Oral) Active. Xolair (150MG  For Solution, Subcutaneous) Active. Omeprazole (20MG  Tablet DR, Oral) Active. Zofran (4MG  Tablet, Oral) Active. Potassium (Oral) Specific strength unknown - Active. Zantac (150MG  Tablet, Oral) Active. Xarelto (20MG   Tablet, Oral) Active. Forteo (600MCG/2.4ML Solution, Subcutaneous) Active. Medications Reconciled     Review of Systems (Teriana Danker A. Jestina Stephani MD; 12/09/2018 12:18 PM) All other systems negative  Vitals (Tanisha A. Brown RMA; 12/09/2018 11:27 AM) 12/09/2018 11:26 AM Weight: 244.2 lb Height: 62in Body Surface Area: 2.08 m Body Mass Index: 44.66 kg/m  Temp.: 97.5F  Pulse: 88 (Regular)  BP: 138/84 (Sitting, Left Arm, Standard)        Physical Exam (Aniela Caniglia A. Dearius Hoffmann MD; 12/09/2018 12:16 PM)  General Note: Walks with a cane due to severe arthritis of her back  Chest and Lung Exam Chest and lung exam reveals -quiet, even and easy respiratory effort with no use of accessory muscles and on auscultation, normal breath sounds, no adventitious sounds and normal vocal resonance. Inspection Chest Wall - Normal. Back - normal.  Cardiovascular Cardiovascular examination reveals -normal heart sounds, regular rate and rhythm with no murmurs and normal pedal pulses bilaterally.  Abdomen Note: Mildly obese. Small bulge is noted just above the umbilicus at a previous port site. No rebound or guarding. Difficult exam due to obesity.  Neurologic Neurologic evaluation reveals -alert and oriented x 3 with no impairment of recent or remote memory. Mental Status-Normal.  Musculoskeletal Note: Limited mobility secondary to back pain. Uses cane for mobility.    Assessment & Plan (Shardai Star A. Ashyla Luth MD; 12/09/2018 12:18 PM)  INCISIONAL HERNIA, WITHOUT OBSTRUCTION OR GANGRENE (K43.2) Impression: Discuss treatment options. She is morbidly obese and her BMI indicates her risk of recurrence as well as over 30%. This hernia appears quite small on imaging and on exam. Recommend small open repair for this without that he will give her some time. General obese risk for complication I would caution against a laparoscopic approach in her. I explained the risk of recurrence and matter  what repair when he was imaging showed a lower  check make her high because of her overall poor health. Given the amount of pain she is having observation in this case is not going to be helpful nor is any attempt at weight loss going to be successful my opinion. She has failed bariatric surgery at this point and is not reasonable for her to lose any weight to fix this. She understands this and might would like to proceed with surgery which will be an open repair with mesh. The risk of hernia repair include bleeding, infection, organ injury, bowel injury, bladder injury, nerve injury recurrent hernia, blood clots, worsening of underlying condition, chronic pain, mesh use, open surgery, death, and the need for other operations. Pt agrees to proceed  Current Plans You are being scheduled for surgery- Our schedulers will call you.  You should hear from our office's scheduling department within 5 working days about the location, date, and time of surgery. We try to make accommodations for patient's preferences in scheduling surgery, but sometimes the OR schedule or the surgeon's schedule prevents [place] from making those accommodations.  If you have not heard from our office [phone]) in 5 working days, call the office and ask for your surgeon's nurse.  If you have other questions about your diagnosis, plan, or surgery, call the office and ask for your surgeon's nurse.  Pt Education - Pamphlet Given - Hernia Surgery: discussed with patient and provided information. The anatomy & physiology of the abdominal wall was discussed. The pathophysiology of hernias was discussed. Natural history risks without surgery including progeressive enlargement, pain, incarceration, & strangulation was discussed. Contributors to complications such as smoking, obesity, diabetes, prior surgery, etc were discussed.  I feel the risks of no intervention will lead to serious problems that outweigh the operative risks;  therefore, I recommended surgery to reduce and repair the hernia. I explained an open approach. I noted the probable use of mesh to patch and/or buttress the hernia repair  Risks such as bleeding, infection, abscess, need for further treatment, heart attack, death, and other risks were discussed. I noted a good likelihood this will help address the problem. Goals of post-operative recovery were discussed as well. Possibility that this will not correct all symptoms was explained. I stressed the importance of low-impact activity, aggressive pain control, avoiding constipation, & not pushing through pain to minimize risk of post-operative chronic pain or injury. Possibility of reherniation especially with smoking, obesity, diabetes, immunosuppression, and other health conditions was discussed. We will work to minimize complications.  An educational handout further explaining the pathology & treatment options was given as well. Questions were answered. The patient expresses understanding & wishes to proceed with surgery.

## 2018-12-22 NOTE — Progress Notes (Signed)
CC'D TO PCP °

## 2019-01-20 NOTE — Progress Notes (Signed)
[place], [place] [address] Phone: [phone] Fax: [phone]      Your procedure is scheduled on Thursday 01/26/2019.  Report to [facility] Main Entrance "A" at 0530 A.M., and check in at the Admitting office.  Call this number if you have problems the morning of surgery:  [phone]  Call [phone] if you have any questions prior to your surgery date Monday-Friday 8am-4pm    Remember:  Do not eat or drink after midnight the night before your surgery    Take these medicines the morning of surgery with A SIP OF WATER: Acetaminophen (Tylenol) - if needed Albuterol (Proventil; Ventolin) inhaler - if needed Amlodipine (Norvasc) Brimonidine ([place]) eye drops Duloxetine (Cymbalta) Flunisolide (Nasalide) nasal spray - if needed Hydrocortisone (COrtef) Levothyroxine (Synthroid) Omeprazole (Prilosec) Ondansetron (Zofran-ODT) - if needed Oxycodone (Oxy IR) - if needed Pregabalin (Lyrica) Ranitidine (Zantac) Symbicort inhaler   7 days prior to surgery STOP taking any Aspirin (unless otherwise instructed by your surgeon), Aleve, Naproxen, Ibuprofen, Motrin, Advil, Goody's, BC's, all herbal medications, fish oil, and all vitamins.  Follow your physicians's instructions on when to stop Xarelto.  If no instructions were given by your physician then you will need to call the office to get those instructions.    WHAT DO I DO ABOUT MY DIABETES MEDICATION?  Marland Kitchen Do not take oral diabetes medicines (pills) the morning of surgery. - DO NOT take your Metformin the day of surgery   How to Manage Your Diabetes Before and After Surgery  Why is it important to control my blood sugar before and after surgery? . Improving blood sugar levels before and after surgery helps healing and can limit problems. . A way of improving blood sugar control is eating a healthy diet by: o  Eating less sugar and carbohydrates o   Increasing activity/exercise o  Talking with your doctor about reaching your blood sugar goals . High blood sugars (greater than 180 mg/dL) can raise your risk of infections and slow your recovery, so you will need to focus on controlling your diabetes during the weeks before surgery. . Make sure that the doctor who takes care of your diabetes knows about your planned surgery including the date and location.  How do I manage my blood sugar before surgery? . Check your blood sugar at least 4 times a day, starting 2 days before surgery, to make sure that the level is not too high or low. o Check your blood sugar the morning of your surgery when you wake up and every 2 hours until you get to the Short Stay unit. . If your blood sugar is less than 70 mg/dL, you will need to treat for low blood sugar: o Do not take insulin. o Treat a low blood sugar (less than 70 mg/dL) with  cup of clear juice (cranberry or apple), 4 glucose tablets, OR glucose gel. o Recheck blood sugar in 15 minutes after treatment (to make sure it is greater than 70 mg/dL). If your blood sugar is not greater than 70 mg/dL on recheck, call [phone] for further instructions. . Report your blood sugar to the short stay nurse when you get to Short Stay.  . If you are admitted to the hospital after surgery: o Your blood sugar will be checked by the staff and you will probably be given insulin after surgery (instead of oral diabetes medicines) to make sure you have good blood sugar  levels. o The goal for blood sugar control after surgery is 80-180 mg/dL.      The Morning of Surgery  Do not wear jewelry, make-up or nail polish.  Do not wear lotions, powders, perfumes, or deodorant  Men may shave face and neck.  Do not bring valuables to the hospital.  [facility] is not responsible for any belongings or valuables.  If you are a smoker, DO NOT Smoke 24 hours prior to surgery  If you wear a CPAP at night please bring your  mask, tubing, and machine the morning of surgery   Remember that you must have someone to transport you home after your surgery, and remain with you for 24 hours if you are discharged the same day.   Contacts, eyeglasses, hearing aids, dentures or bridgework may not be worn into surgery.    Leave your suitcase in the car.  After surgery it may be brought to your room.  For patients admitted to the hospital, discharge time will be determined by your treatment team.  Patients discharged the day of surgery will not be allowed to drive home.    Special instructions:   [place]- Preparing For Surgery  Before surgery, you can play an important role. Because skin is not sterile, your skin needs to be as free of germs as possible. You can reduce the number of germs on your skin by washing with CHG (chlorahexidine gluconate) Soap before surgery.  CHG is an antiseptic cleaner which kills germs and bonds with the skin to continue killing germs even after washing.    Oral Hygiene is also important to reduce your risk of infection.  Remember - BRUSH YOUR TEETH THE MORNING OF SURGERY WITH YOUR REGULAR TOOTHPASTE  Please do not use if you have an allergy to CHG or antibacterial soaps. If your skin becomes reddened/irritated stop using the CHG.  Do not shave (including legs and underarms) for at least 48 hours prior to first CHG shower. It is OK to shave your face.  Please follow these instructions carefully.   1. Shower the NIGHT BEFORE SURGERY and the MORNING OF SURGERY with CHG Soap.   2. If you chose to wash your hair, wash your hair first as usual with your normal shampoo.  3. After you shampoo, rinse your hair and body thoroughly to remove the shampoo.  4. Use CHG as you would any other liquid soap. You can apply CHG directly to the skin and wash gently with a scrungie or a clean washcloth.   5. Apply the CHG Soap to your body ONLY FROM THE NECK DOWN.  Do not use on open wounds or open  sores. Avoid contact with your eyes, ears, mouth and genitals (private parts). Wash Face and genitals (private parts)  with your normal soap.   6. Wash thoroughly, paying special attention to the area where your surgery will be performed.  7. Thoroughly rinse your body with warm water from the neck down.  8. DO NOT shower/wash with your normal soap after using and rinsing off the CHG Soap.  9. Pat yourself dry with a CLEAN TOWEL.  10. Wear CLEAN PAJAMAS to bed the night before surgery, wear comfortable clothes the morning of surgery  11. Place CLEAN SHEETS on your bed the night of your first shower and DO NOT SLEEP WITH PETS.    Day of Surgery:  Do not apply any deodorants/lotions. Please shower the morning of surgery with the CHG soap  Please wear  clean clothes to the hospital/surgery center.   Remember to brush your teeth WITH YOUR REGULAR TOOTHPASTE.   Please read over the following fact sheets that you were given.

## 2019-01-23 ENCOUNTER — Encounter (HOSPITAL_COMMUNITY): Payer: Self-pay

## 2019-01-23 ENCOUNTER — Other Ambulatory Visit (HOSPITAL_COMMUNITY)
Admission: RE | Admit: 2019-01-23 | Discharge: 2019-01-23 | Disposition: A | Discharge status: Home / Self Care | Payer: Medicare Other | Source: Ambulatory Visit | Attending: Surgery | Admitting: Surgery

## 2019-01-23 ENCOUNTER — Other Ambulatory Visit: Payer: Self-pay

## 2019-01-23 ENCOUNTER — Encounter (HOSPITAL_COMMUNITY)
Admission: RE | Admit: 2019-01-23 | Discharge: 2019-01-23 | Disposition: A | Discharge status: Home / Self Care | Payer: Medicare Other | Source: Ambulatory Visit | Attending: Surgery | Admitting: Surgery

## 2019-01-23 DIAGNOSIS — Z01812 Encounter for preprocedural laboratory examination: Secondary | ICD-10-CM | POA: Insufficient documentation

## 2019-01-23 DIAGNOSIS — Z20828 Contact with and (suspected) exposure to other viral communicable diseases: Secondary | ICD-10-CM | POA: Insufficient documentation

## 2019-01-23 LAB — CBC WITH DIFFERENTIAL/PLATELET
Abs Immature Granulocytes: 0.06 10*3/uL (ref 0.00–0.07)
Basophils Absolute: 0.1 10*3/uL (ref 0.0–0.1)
Basophils Relative: 2 %
Eosinophils Absolute: 0.2 10*3/uL (ref 0.0–0.5)
Eosinophils Relative: 3 %
HCT: 42.5 % (ref 36.0–46.0)
Hemoglobin: 13.2 g/dL (ref 12.0–15.0)
Immature Granulocytes: 1 %
Lymphocytes Relative: 40 %
Lymphs Abs: 2.4 10*3/uL (ref 0.7–4.0)
MCH: 29.7 pg (ref 26.0–34.0)
MCHC: 31.1 g/dL (ref 30.0–36.0)
MCV: 95.7 fL (ref 80.0–100.0)
Monocytes Absolute: 0.7 10*3/uL (ref 0.1–1.0)
Monocytes Relative: 12 %
Neutro Abs: 2.6 10*3/uL (ref 1.7–7.7)
Neutrophils Relative %: 42 %
Platelets: 299 10*3/uL (ref 150–400)
RBC: 4.44 MIL/uL (ref 3.87–5.11)
RDW: 14.2 % (ref 11.5–15.5)
WBC: 6 10*3/uL (ref 4.0–10.5)
nRBC: 0 % (ref 0.0–0.2)

## 2019-01-23 LAB — COMPREHENSIVE METABOLIC PANEL
ALT: 11 U/L (ref 0–44)
AST: 25 U/L (ref 15–41)
Albumin: 3.2 g/dL — ABNORMAL LOW (ref 3.5–5.0)
Alkaline Phosphatase: 54 U/L (ref 38–126)
Anion gap: 7 (ref 5–15)
BUN: 15 mg/dL (ref 6–20)
CO2: 22 mmol/L (ref 22–32)
Calcium: 9.3 mg/dL (ref 8.9–10.3)
Chloride: 111 mmol/L (ref 98–111)
Creatinine, Ser: 1.25 mg/dL — ABNORMAL HIGH (ref 0.44–1.00)
GFR calc Af Amer: 54 mL/min — ABNORMAL LOW (ref >60)
GFR calc non Af Amer: 47 mL/min — ABNORMAL LOW (ref >60)
Glucose, Bld: 68 mg/dL — ABNORMAL LOW (ref 70–99)
Potassium: 3.7 mmol/L (ref 3.5–5.1)
Sodium: 140 mmol/L (ref 135–145)
Total Bilirubin: 0.5 mg/dL (ref 0.3–1.2)
Total Protein: 5.3 g/dL — ABNORMAL LOW (ref 6.5–8.1)

## 2019-01-23 LAB — SARS CORONAVIRUS 2 (TAT 6-24 HRS): SARS Coronavirus 2: NEGATIVE

## 2019-01-23 NOTE — Progress Notes (Signed)
PCP - Everardo Pacific Cardiologist - with [facility]   Chest x-ray - n/a EKG - 01-05-19 (from [facility]) - EKG tracing requested @ PAT appt Stress Test - 2015 ECHO - 2015 Cardiac Cath - 2015  SA - yes, does not wear CPAP   Blood Thinner Instructions: Pt instructed to stop Xarelto on 01-24-19   COVID TEST- 01-23-19   Anesthesia review: yes, heart history  Patient denies shortness of breath, fever, cough and chest pain at PAT appointment   Patient verbalized understanding of instructions that were given to them at the PAT appointment. Patient was also instructed that they will need to review over the PAT instructions again at home before surgery.

## 2019-01-24 NOTE — Progress Notes (Signed)
Anesthesia Chart Review: Seen by PCP Dr. Alba Destine at [facility] on 01/03/19 for preop clearance. Preop discussion was fairly extensive and is copied below:  Overall the patients functional status is complicated by chronic back pain and radiculopathy, with METs <4 due to pain. She does have co-morbidities important to consider in the peri-operative period including Adrenal insufficiency, asthma. In particular she is at risk for adrenal crisis, and may require an increase in her steroid requirement. She is also on chronic opiates, and may require more pain medication to adequately control post-operative pain.   -Cardiac Risk Estimation: low risk for cardiovascular complications with RCRI score of 0, although additional minor risk factor of afib so actual risk may be slightly higher. -Current medications which may produce withdrawal symptoms if withheld perioperatively: hydrocortisone for adrenal insufficiency, oxycodone  Plan:  1. Preoperative workup as follows: ECG today, no other workup necessary. 2. Change in medication regimen before surgery: Hold Xarelto 48 hours prior to surgery, hold metformin and potassium supplement the morning of surgery.  3. Cardiac Evaluation: Overall low risk, with normal exam and no concerning signs of exertional chest pain or dyspnea, and no concerns for undiagnosed heart failure 4. Pulmonary evaluation: Patient does have significant history of severe persistent asthma, however since patient has been on omalizumab symptoms have been under control, with no recent hospitalizations for asthma and no required use of PRN albuterol for multiple months. 5. Other systems: 6. The 10-year ASCVD risk score Mikey Bussing DC Jr., et al., 2013) is: 4.1%  7. Deep vein thrombosis prophylaxis postoperatively:Lovenox, then restart Xarelto when able. 8. Surveillance for postoperative MI with ECG immediately postoperatively and on postoperative days 1 and 2 AND troponin levels 24 hours postoperatively and on  day 4 or hospital discharge (whichever comes first): not indicated. (RCRI >1, age > 26 or known ASCVD)  EKG 01/03/19 (care everywhere, narrative only, tracing requested): Normal sinus rhythm. Rate 63.  TTE 2015 (care everywhere): Interpretation: Clinical Diagnoses  and Echocardiographic  Findings     Left ventricular  hypertrophy     Normal  left ventricular  contractile function  (estimated  ejection     fraction  > 55%)     Dilated  left atrium     Dilated  right atrium   Wynonia Musty [facility] Short Stay Center/Anesthesiology Phone [phone] 01/24/2019 9:41 AM

## 2019-01-24 NOTE — Anesthesia Preprocedure Evaluation (Addendum)
Anesthesia Evaluation  Patient identified by MRN, date of birth, ID band Patient awake    Reviewed: Allergy & Precautions, H&P , NPO status , Patient's Chart, lab work & pertinent test results  Airway Mallampati: II  TM Distance: >3 FB Neck ROM: Full    Dental no notable dental hx. (+) Teeth Intact, Dental Advisory Given   Pulmonary asthma , COPD,  COPD inhaler,    Pulmonary exam normal breath sounds clear to auscultation       Cardiovascular Exercise Tolerance: Good hypertension, Pt. on medications  Rhythm:Regular Rate:Normal     Neuro/Psych  Headaches, negative psych ROS   GI/Hepatic Neg liver ROS, PUD, GERD  Medicated and Controlled,  Endo/Other  negative endocrine ROSHypothyroidism   Renal/GU Renal InsufficiencyRenal disease  negative genitourinary   Musculoskeletal  (+) Arthritis ,   Abdominal   Peds  Hematology  (+) Blood dyscrasia, anemia ,   Anesthesia Other Findings   Reproductive/Obstetrics negative OB ROS                           Anesthesia Physical Anesthesia Plan  ASA: III  Anesthesia Plan: General   Post-op Pain Management:    Induction: Intravenous  PONV Risk Score and Plan: 4 or greater and Ondansetron, Dexamethasone and Midazolam  Airway Management Planned: Oral ETT  Additional Equipment:   Intra-op Plan:   Post-operative Plan: Extubation in OR  Informed Consent: I have reviewed the patients History and Physical, chart, labs and discussed the procedure including the risks, benefits and alternatives for the proposed anesthesia with the patient or authorized representative who has indicated his/her understanding and acceptance.     Dental advisory given  Plan Discussed with: CRNA  Anesthesia Plan Comments: (Seen by PCP Dr. Alba Destine at [facility] on 01/03/19 for preop clearance. Preop discussion was fairly extensive and is copied below:  Overall the patients  functional status is complicated by chronic back pain and radiculopathy, with METs <4 due to pain. She does have co-morbidities important to consider in the peri-operative period including Adrenal insufficiency, asthma. In particular she is at risk for adrenal crisis, and may require an increase in her steroid requirement. She is also on chronic opiates, and may require more pain medication to adequately control post-operative pain.   -Cardiac Risk Estimation: low risk for cardiovascular complications with RCRI score of 0, although additional minor risk factor of afib so actual risk may be slightly higher. -Current medications which may produce withdrawal symptoms if withheld perioperatively: hydrocortisone for adrenal insufficiency, oxycodone  Plan:  1. Preoperative workup as follows: ECG today, no other workup necessary. 2. Change in medication regimen before surgery: Hold Xarelto 48 hours prior to surgery, hold metformin and potassium supplement the morning of surgery.  3. Cardiac Evaluation: Overall low risk, with normal exam and no concerning signs of exertional chest pain or dyspnea, and no concerns for undiagnosed heart failure 4. Pulmonary evaluation: Patient does have significant history of severe persistent asthma, however since patient has been on omalizumab symptoms have been under control, with no recent hospitalizations for asthma and no required use of PRN albuterol for multiple months. 5. Other systems: 6. The 10-year ASCVD risk score Mikey Bussing DC Jr., et al., 2013) is: 4.1%  7. Deep vein thrombosis prophylaxis postoperatively:Lovenox, then restart Xarelto when able. 8. Surveillance for postoperative MI with ECG immediately postoperatively and on postoperative days 1 and 2 AND troponin levels 24 hours postoperatively and on day 4 or hospital  discharge (whichever comes first): not indicated. (RCRI >1, age > 64 or known ASCVD)  EKG 01/03/19 (care everywhere, narrative only, tracing requested):  Normal sinus rhythm. Rate 63.  TTE 2015 (care everywhere): Interpretation: Clinical Diagnoses  and Echocardiographic  Findings     Left ventricular  hypertrophy     Normal  left ventricular  contractile function  (estimated  ejection     fraction  > 55%)     Dilated  left atrium     Dilated  right atrium  )       Anesthesia Quick Evaluation

## 2019-01-26 ENCOUNTER — Encounter (HOSPITAL_COMMUNITY)
Admission: RE | Disposition: A | Discharge status: Home / Self Care | Payer: Self-pay | Source: Home / Self Care | Attending: Surgery

## 2019-01-26 ENCOUNTER — Ambulatory Visit (HOSPITAL_COMMUNITY): Payer: Medicare Other | Admitting: Physician Assistant

## 2019-01-26 ENCOUNTER — Ambulatory Visit (HOSPITAL_COMMUNITY): Payer: Medicare Other | Admitting: Anesthesiology

## 2019-01-26 ENCOUNTER — Ambulatory Visit (HOSPITAL_COMMUNITY)
Admission: RE | Admit: 2019-01-26 | Discharge: 2019-01-26 | Disposition: A | Discharge status: Home / Self Care | Payer: Medicare Other | Attending: Surgery | Admitting: Surgery

## 2019-01-26 ENCOUNTER — Encounter (HOSPITAL_COMMUNITY): Payer: Self-pay | Admitting: General Practice

## 2019-01-26 ENCOUNTER — Other Ambulatory Visit: Payer: Self-pay

## 2019-01-26 DIAGNOSIS — Z886 Allergy status to analgesic agent status: Secondary | ICD-10-CM | POA: Diagnosis not present

## 2019-01-26 DIAGNOSIS — Z7901 Long term (current) use of anticoagulants: Secondary | ICD-10-CM | POA: Insufficient documentation

## 2019-01-26 DIAGNOSIS — I4891 Unspecified atrial fibrillation: Secondary | ICD-10-CM | POA: Diagnosis not present

## 2019-01-26 DIAGNOSIS — N289 Disorder of kidney and ureter, unspecified: Secondary | ICD-10-CM | POA: Diagnosis not present

## 2019-01-26 DIAGNOSIS — J449 Chronic obstructive pulmonary disease, unspecified: Secondary | ICD-10-CM | POA: Insufficient documentation

## 2019-01-26 DIAGNOSIS — M479 Spondylosis, unspecified: Secondary | ICD-10-CM | POA: Diagnosis not present

## 2019-01-26 DIAGNOSIS — E785 Hyperlipidemia, unspecified: Secondary | ICD-10-CM | POA: Diagnosis not present

## 2019-01-26 DIAGNOSIS — K219 Gastro-esophageal reflux disease without esophagitis: Secondary | ICD-10-CM | POA: Insufficient documentation

## 2019-01-26 DIAGNOSIS — Z880 Allergy status to penicillin: Secondary | ICD-10-CM | POA: Insufficient documentation

## 2019-01-26 DIAGNOSIS — Z6841 Body Mass Index (BMI) 40.0 and over, adult: Secondary | ICD-10-CM | POA: Diagnosis not present

## 2019-01-26 DIAGNOSIS — Z79899 Other long term (current) drug therapy: Secondary | ICD-10-CM | POA: Insufficient documentation

## 2019-01-26 DIAGNOSIS — K432 Incisional hernia without obstruction or gangrene: Secondary | ICD-10-CM | POA: Diagnosis present

## 2019-01-26 DIAGNOSIS — Z885 Allergy status to narcotic agent status: Secondary | ICD-10-CM | POA: Insufficient documentation

## 2019-01-26 DIAGNOSIS — Z79890 Hormone replacement therapy (postmenopausal): Secondary | ICD-10-CM | POA: Insufficient documentation

## 2019-01-26 DIAGNOSIS — G4733 Obstructive sleep apnea (adult) (pediatric): Secondary | ICD-10-CM | POA: Insufficient documentation

## 2019-01-26 DIAGNOSIS — E039 Hypothyroidism, unspecified: Secondary | ICD-10-CM | POA: Insufficient documentation

## 2019-01-26 DIAGNOSIS — Z9884 Bariatric surgery status: Secondary | ICD-10-CM | POA: Diagnosis not present

## 2019-01-26 DIAGNOSIS — I10 Essential (primary) hypertension: Secondary | ICD-10-CM | POA: Insufficient documentation

## 2019-01-26 HISTORY — PX: INCISIONAL HERNIA REPAIR: SHX193

## 2019-01-26 LAB — PROTIME-INR
INR: 1 (ref 0.8–1.2)
Prothrombin Time: 13.4 seconds (ref 11.4–15.2)

## 2019-01-26 SURGERY — REPAIR, HERNIA, INCISIONAL
Anesthesia: General | Site: Abdomen

## 2019-01-26 MED ORDER — BUPIVACAINE-EPINEPHRINE 0.5% -1:200000 IJ SOLN
INTRAMUSCULAR | Status: DC | PRN
  Administered 2019-01-26: 30 mL

## 2019-01-26 MED ORDER — CLINDAMYCIN PHOSPHATE 900 MG/50ML IV SOLN
900.0000 mg | INTRAVENOUS | Status: AC
  Administered 2019-01-26: 08:00:00 900 mg via INTRAVENOUS
  Filled 2019-01-26: qty 50

## 2019-01-26 MED ORDER — LACTATED RINGERS IV SOLN
INTRAVENOUS | Status: DC | PRN
  Administered 2019-01-26 (×2): via INTRAVENOUS

## 2019-01-26 MED ORDER — HYDROMORPHONE HCL 1 MG/ML IJ SOLN
INTRAMUSCULAR | Status: AC
  Filled 2019-01-26: qty 1

## 2019-01-26 MED ORDER — ROCURONIUM BROMIDE 10 MG/ML (PF) SYRINGE
PREFILLED_SYRINGE | INTRAVENOUS | Status: AC
  Filled 2019-01-26: qty 10

## 2019-01-26 MED ORDER — ROCURONIUM BROMIDE 10 MG/ML (PF) SYRINGE
PREFILLED_SYRINGE | INTRAVENOUS | Status: DC | PRN
  Administered 2019-01-26: 20 mg via INTRAVENOUS

## 2019-01-26 MED ORDER — LIDOCAINE 2% (20 MG/ML) 5 ML SYRINGE
INTRAMUSCULAR | Status: AC
  Filled 2019-01-26: qty 5

## 2019-01-26 MED ORDER — MIDAZOLAM HCL 2 MG/2ML IJ SOLN
INTRAMUSCULAR | Status: DC | PRN
  Administered 2019-01-26: 2 mg via INTRAVENOUS

## 2019-01-26 MED ORDER — PROPOFOL 10 MG/ML IV BOLUS
INTRAVENOUS | Status: DC | PRN
  Administered 2019-01-26: 20 mg via INTRAVENOUS
  Administered 2019-01-26: 140 mg via INTRAVENOUS

## 2019-01-26 MED ORDER — PROPOFOL 10 MG/ML IV BOLUS
INTRAVENOUS | Status: AC
  Filled 2019-01-26: qty 20

## 2019-01-26 MED ORDER — OXYCODONE HCL 5 MG PO TABS: 5.0000 mg | ORAL_TABLET | Freq: Four times a day (QID) | ORAL | 0 refills | Status: DC | PRN

## 2019-01-26 MED ORDER — FENTANYL CITRATE (PF) 250 MCG/5ML IJ SOLN
INTRAMUSCULAR | Status: DC | PRN
  Administered 2019-01-26 (×3): 50 ug via INTRAVENOUS

## 2019-01-26 MED ORDER — EPHEDRINE SULFATE 50 MG/ML IJ SOLN
INTRAMUSCULAR | Status: DC | PRN
  Administered 2019-01-26: 10 mg via INTRAVENOUS

## 2019-01-26 MED ORDER — HYDROCORTISONE NA SUCCINATE PF 100 MG IJ SOLR
INTRAMUSCULAR | Status: DC | PRN
  Administered 2019-01-26: 125 mg via INTRAVENOUS

## 2019-01-26 MED ORDER — CHLORHEXIDINE GLUCONATE CLOTH 2 % EX PADS: 6.0000 | MEDICATED_PAD | Freq: Once | CUTANEOUS | Status: DC

## 2019-01-26 MED ORDER — DEXAMETHASONE SODIUM PHOSPHATE 10 MG/ML IJ SOLN
INTRAMUSCULAR | Status: DC | PRN
  Administered 2019-01-26: 10 mg via INTRAVENOUS

## 2019-01-26 MED ORDER — HYDROMORPHONE HCL 1 MG/ML IJ SOLN
0.2500 mg | INTRAMUSCULAR | Status: DC | PRN
  Administered 2019-01-26 (×2): 0.5 mg via INTRAVENOUS

## 2019-01-26 MED ORDER — FENTANYL CITRATE (PF) 250 MCG/5ML IJ SOLN
INTRAMUSCULAR | Status: AC
  Filled 2019-01-26: qty 5

## 2019-01-26 MED ORDER — ACETAMINOPHEN 500 MG PO TABS
1000.0000 mg | ORAL_TABLET | Freq: Once | ORAL | Status: DC
  Filled 2019-01-26: qty 2

## 2019-01-26 MED ORDER — BUPIVACAINE-EPINEPHRINE 0.5% -1:200000 IJ SOLN
INTRAMUSCULAR | Status: AC
  Filled 2019-01-26: qty 1

## 2019-01-26 MED ORDER — DEXAMETHASONE SODIUM PHOSPHATE 10 MG/ML IJ SOLN
INTRAMUSCULAR | Status: AC
  Filled 2019-01-26: qty 1

## 2019-01-26 MED ORDER — SUCCINYLCHOLINE CHLORIDE 20 MG/ML IJ SOLN
INTRAMUSCULAR | Status: DC | PRN
  Administered 2019-01-26: 100 mg via INTRAVENOUS

## 2019-01-26 MED ORDER — ONDANSETRON HCL 4 MG/2ML IJ SOLN
INTRAMUSCULAR | Status: AC
  Filled 2019-01-26: qty 2

## 2019-01-26 MED ORDER — LIDOCAINE 2% (20 MG/ML) 5 ML SYRINGE
INTRAMUSCULAR | Status: DC | PRN
  Administered 2019-01-26: 60 mg via INTRAVENOUS

## 2019-01-26 MED ORDER — 0.9 % SODIUM CHLORIDE (POUR BTL) OPTIME
TOPICAL | Status: DC | PRN
  Administered 2019-01-26: 1000 mL

## 2019-01-26 MED ORDER — MIDAZOLAM HCL 2 MG/2ML IJ SOLN
INTRAMUSCULAR | Status: AC
  Filled 2019-01-26: qty 2

## 2019-01-26 MED ORDER — SUGAMMADEX SODIUM 200 MG/2ML IV SOLN
INTRAVENOUS | Status: DC | PRN
  Administered 2019-01-26: 200 mg via INTRAVENOUS

## 2019-01-26 MED ORDER — SODIUM CHLORIDE 0.9 % IV SOLN: INTRAVENOUS | Status: DC | PRN

## 2019-01-26 MED ORDER — ONDANSETRON HCL 4 MG/2ML IJ SOLN
INTRAMUSCULAR | Status: DC | PRN
  Administered 2019-01-26: 4 mg via INTRAVENOUS

## 2019-01-26 MED ORDER — PHENYLEPHRINE 40 MCG/ML (10ML) SYRINGE FOR IV PUSH (FOR BLOOD PRESSURE SUPPORT)
PREFILLED_SYRINGE | INTRAVENOUS | Status: DC | PRN
  Administered 2019-01-26 (×3): 80 ug via INTRAVENOUS

## 2019-01-26 SURGICAL SUPPLY — 44 items
BINDER ABD UNIV 10 28-50 (GAUZE/BANDAGES/DRESSINGS) ×1 IMPLANT
BINDER ABDOM UNIV 10 (GAUZE/BANDAGES/DRESSINGS) ×3
BINDER ABDOMINAL 12 ML 46-62 (SOFTGOODS) IMPLANT
CANISTER SUCT 3000ML PPV (MISCELLANEOUS) ×3 IMPLANT
CHLORAPREP W/TINT 26 (MISCELLANEOUS) ×3 IMPLANT
COVER SURGICAL LIGHT HANDLE (MISCELLANEOUS) ×3 IMPLANT
COVER WAND RF STERILE (DRAPES) IMPLANT
DERMABOND ADVANCED (GAUZE/BANDAGES/DRESSINGS) ×2
DERMABOND ADVANCED .7 DNX12 (GAUZE/BANDAGES/DRESSINGS) ×1 IMPLANT
DRAIN CHANNEL 19F RND (DRAIN) IMPLANT
DRAPE LAPAROSCOPIC ABDOMINAL (DRAPES) ×3 IMPLANT
DRSG PAD ABDOMINAL 8X10 ST (GAUZE/BANDAGES/DRESSINGS) IMPLANT
ELECT CAUTERY BLADE 6.4 (BLADE) IMPLANT
ELECT REM PT RETURN 9FT ADLT (ELECTROSURGICAL) ×3
ELECTRODE REM PT RTRN 9FT ADLT (ELECTROSURGICAL) ×1 IMPLANT
EVACUATOR SILICONE 100CC (DRAIN) IMPLANT
GAUZE SPONGE 4X4 12PLY STRL (GAUZE/BANDAGES/DRESSINGS) IMPLANT
GAUZE SPONGE 4X4 12PLY STRL LF (GAUZE/BANDAGES/DRESSINGS) ×3 IMPLANT
GLOVE BIO SURGEON STRL SZ8 (GLOVE) ×3 IMPLANT
GLOVE BIOGEL PI IND STRL 8 (GLOVE) ×1 IMPLANT
GLOVE BIOGEL PI INDICATOR 8 (GLOVE) ×2
GOWN STRL REUS W/ TWL LRG LVL3 (GOWN DISPOSABLE) ×2 IMPLANT
GOWN STRL REUS W/ TWL XL LVL3 (GOWN DISPOSABLE) ×1 IMPLANT
GOWN STRL REUS W/TWL LRG LVL3 (GOWN DISPOSABLE) ×4
GOWN STRL REUS W/TWL XL LVL3 (GOWN DISPOSABLE) ×2
KIT BASIN OR (CUSTOM PROCEDURE TRAY) ×3 IMPLANT
KIT TURNOVER KIT B (KITS) ×3 IMPLANT
MESH VENTRALEX ST 1-7/10 CRC S (Mesh General) ×3 IMPLANT
NEEDLE HYPO 25GX1X1/2 BEV (NEEDLE) ×3 IMPLANT
NS IRRIG 1000ML POUR BTL (IV SOLUTION) ×3 IMPLANT
PACK GENERAL/GYN (CUSTOM PROCEDURE TRAY) ×3 IMPLANT
PAD ARMBOARD 7.5X6 YLW CONV (MISCELLANEOUS) ×3 IMPLANT
PENCIL SMOKE EVACUATOR (MISCELLANEOUS) ×3 IMPLANT
STAPLER VISISTAT 35W (STAPLE) IMPLANT
SUT ETHILON 2 0 FS 18 (SUTURE) IMPLANT
SUT MNCRL AB 3-0 PS2 27 (SUTURE) ×3 IMPLANT
SUT MON AB 4-0 PC3 18 (SUTURE) ×3 IMPLANT
SUT NOVA NAB DX-16 0-1 5-0 T12 (SUTURE) ×3 IMPLANT
SUT PDS AB 1 CTX 36 (SUTURE) IMPLANT
SUT VIC AB 2-0 SH 18 (SUTURE) ×3 IMPLANT
SYR CONTROL 10ML LL (SYRINGE) ×3 IMPLANT
TOWEL GREEN STERILE (TOWEL DISPOSABLE) IMPLANT
TOWEL GREEN STERILE FF (TOWEL DISPOSABLE) ×3 IMPLANT
TRAY FOLEY MTR SLVR 16FR STAT (SET/KITS/TRAYS/PACK) IMPLANT

## 2019-01-26 NOTE — Interval H&P Note (Signed)
History and Physical Interval Note:  01/26/2019 7:20 AM  Deborah Murray  has presented today for surgery, with the diagnosis of INCISIONAL HERNIA.  The various methods of treatment have been discussed with the patient and family. After consideration of risks, benefits and other options for treatment, the patient has consented to  Procedure(s): REPAIR OF SUPRAUMBILICAL INCISIONAL HERNIA WITH MESH (N/A) as a surgical intervention.  The patient's history has been reviewed, patient examined, no change in status, stable for surgery.  I have reviewed the patient's chart and labs.  Questions were answered to the patient's satisfaction.     [place]

## 2019-01-26 NOTE — Anesthesia Procedure Notes (Addendum)
Procedure Name: Intubation Date/Time: 01/26/2019 7:37 AM Performed by: Alain Marion, CRNA Pre-anesthesia Checklist: Patient identified, Emergency Drugs available, Suction available and Patient being monitored Patient Re-evaluated:Patient Re-evaluated prior to induction Oxygen Delivery Method: Circle System Utilized Preoxygenation: Pre-oxygenation with 100% oxygen Induction Type: IV induction Ventilation: Mask ventilation without difficulty Laryngoscope Size: Mac and 3 Grade View: Grade I Tube type: Oral Tube size: 7.0 mm Number of attempts: 1 Airway Equipment and Method: Stylet and Oral airway Placement Confirmation: ETT inserted through vocal cords under direct vision,  positive ETCO2 and breath sounds checked- equal and bilateral Tube secured with: Tape Dental Injury: Teeth and Oropharynx as per pre-operative assessment

## 2019-01-26 NOTE — Op Note (Signed)
Preoperative diagnosis: Incisional hernia without incarceration strangulation  Postoperative diagnosis: Same  Procedure: Repair of 2 cm incisional hernia with 4.3 cm circular coated mesh Ventralex  Surgeon: Erroll Luna, MD  Anesthesia: General with 0.5% local plain  EBL: 10 cc  Specimens: None  IV fluids: Per anesthesia record  Indications for procedure: The patient is a 58-year-old female with history of previous gastric bypass surgery.  Unfortunately she is not lost much weight from that.  She is developed a port site hernia above her umbilicus.  This was picked up on CT scan last year it has been giving her progressive abdominal pain.  We discussed repair and the likelihood this would improve her abdominal pain.  I cautioned her about her elevated BMI and high risk of recurrence.  Unfortunate, she is poor mobility and is failed gastric bypass surgery.  It is causing quite a bit of discomfort interfering with quality of life.  Offered her open repair with mesh quoted recurrence rates well over 30%.  She understood that but wished to proceed for symptomatic relief.The risk of hernia repair include bleeding,  Infection,   Recurrence of the hernia,  Mesh use, chronic pain,  Organ injury,  Bowel injury,  Bladder injury, obstruction,   nerve injury with numbness around the incision,  Death,  and worsening of preexisting  medical problems.  The alternatives to surgery have been discussed as well..  Long term expectations of both operative and non operative treatments have been discussed.   The patient agrees to proceed.  Description of procedure: The patient was met in the holding area and questions were answered.  She was taken back to the operative room.  She is placed supine upon the table.  After induction of general esthesia, her abdomen was prepped and draped in sterile fashion timeout was performed.  CT scan images were available for review.  Timeout was performed midline incision was made  above the umbilicus for about 6 cm.  We dissected down and found a very small port site hernia which had some preperitoneal fat within it.  I was able to dissect this free and reduced it back into the preperitoneal space.  The defect itself was quite small measuring 2 cm in maximal diameter.  I was able to use a 4.3 cm coated Ventralex mesh and placed this in a subfascial position.  This was secured with #2 Novafil circumferentially.  I then closed the fascial defect with #2 Novafil.  Irrigation was used.  Hemostasis was achieved.  I infiltrated the wound with 0.5% Sensorcaine circumferentially.  Wound was closed in layers with a deep layer of 2-0 Vicryl.  3-0 Monocryl was used to close the skin.  Dermabond applied.  Binder placed.  All final counts were found to be correct.  The patient was then awoken extubated taken recovery in satisfactory condition.

## 2019-01-26 NOTE — Anesthesia Postprocedure Evaluation (Signed)
Anesthesia Post Note  Patient: Deborah Murray  Procedure(s) Performed: REPAIR OF SUPRAUMBILICAL INCISIONAL HERNIA WITH MESH (N/A Abdomen)     Patient location during evaluation: PACU Anesthesia Type: General Level of consciousness: awake and alert Pain management: pain level controlled Vital Signs Assessment: post-procedure vital signs reviewed and stable Respiratory status: spontaneous breathing, nonlabored ventilation and respiratory function stable Cardiovascular status: blood pressure returned to baseline and stable Postop Assessment: no apparent nausea or vomiting Anesthetic complications: no    Last Vitals:  Vitals:   01/26/19 0930 01/26/19 0945  BP: 111/73 109/73  Pulse: 78 77  Resp: (!) 24 (!) 21  Temp:  36.4 C  SpO2: 97% 97%    Last Pain:  Vitals:   01/26/19 0945  TempSrc:   PainSc: Asleep                 Taite Baldassari,W. EDMOND

## 2019-01-26 NOTE — Transfer of Care (Signed)
Immediate Anesthesia Transfer of Care Note  Patient: Shahara L Pesci  Procedure(s) Performed: REPAIR OF SUPRAUMBILICAL INCISIONAL HERNIA WITH MESH (N/A Abdomen)  Patient Location: PACU  Anesthesia Type:General  Level of Consciousness: awake, alert  and oriented  Airway & Oxygen Therapy: Patient Spontanous Breathing  Post-op Assessment: Report given to RN  Post vital signs: Reviewed and stable  Last Vitals:  Vitals Value Taken Time  BP 117/80 01/26/19 0856  Temp    Pulse 78 01/26/19 0859  Resp 20 01/26/19 0859  SpO2 100 % 01/26/19 0859  Vitals shown include unvalidated device data.  Last Pain:  Vitals:   01/26/19 0656  TempSrc:   PainSc: 2       Patients Stated Pain Goal: 3 ([id number])  Complications: No apparent anesthesia complications

## 2019-01-26 NOTE — Discharge Instructions (Signed)
[facility] _______[facility] ° °UMBILICAL OR INGUINAL HERNIA REPAIR: POST OP INSTRUCTIONS ° °Always review your discharge instruction sheet given to you by the facility where your surgery was performed. °IF YOU HAVE DISABILITY OR FAMILY LEAVE FORMS, YOU MUST BRING THEM TO THE OFFICE FOR PROCESSING.   °DO NOT GIVE THEM TO YOUR DOCTOR. ° °1. A  prescription for pain medication may be given to you upon discharge.  Take your pain medication as prescribed, if needed.  If narcotic pain medicine is not needed, then you may take acetaminophen (Tylenol) or ibuprofen (Advil) as needed. °2. Take your usually prescribed medications unless otherwise directed. °If you need a refill on your pain medication, please contact your pharmacy.  They will contact our office to request authorization. Prescriptions will not be filled after 5 pm or on week-ends. °3. You should follow a light diet the first 24 hours after arrival home, such as soup and crackers, etc.  Be sure to include lots of fluids daily.  Resume your normal diet the day after surgery. °4.Most patients will experience some swelling and bruising around the umbilicus or in the groin and scrotum.  Ice packs and reclining will help.  Swelling and bruising can take several days to resolve.  °6. It is common to experience some constipation if taking pain medication after surgery.  Increasing fluid intake and taking a stool softener (such as Colace) will usually help or prevent this problem from occurring.  A mild laxative (Milk of Magnesia or Miralax) should be taken according to package directions if there are no bowel movements after 48 hours. °7. Unless discharge instructions indicate otherwise, you may remove your bandages 24-48 hours after surgery, and you may shower at that time.  You may have steri-strips (small skin tapes) in place directly over the incision.  These strips should be left on the skin for 7-10 days.  If your surgeon used skin glue on the  incision, you may shower in 24 hours.  The glue will flake off over the next 2-3 weeks.  Any sutures or staples will be removed at the office during your follow-up visit. °8. ACTIVITIES:  You may resume regular (light) daily activities beginning the next day--such as daily self-care, walking, climbing stairs--gradually increasing activities as tolerated.  You may have sexual intercourse when it is comfortable.  Refrain from any heavy lifting or straining until approved by your doctor. ° °a.You may drive when you are no longer taking prescription pain medication, you can comfortably wear a seatbelt, and you can safely maneuver your car and apply brakes. °b.RETURN TO WORK:   °_____________________________________________ ° °9.You should see your doctor in the office for a follow-up appointment approximately 2-3 weeks after your surgery.  Make sure that you call for this appointment within a day or two after you arrive home to insure a convenient appointment time. °10.OTHER INSTRUCTIONS: _________________________ °   _____________________________________ ° °WHEN TO CALL YOUR DOCTOR: °1. Fever over 101.0 °2. Inability to urinate °3. Nausea and/or vomiting °4. Extreme swelling or bruising °5. Continued bleeding from incision. °6. Increased pain, redness, or drainage from the incision ° °The clinic staff is available to answer your questions during regular business hours.  Please don’t hesitate to call and ask to speak to one of the nurses for clinical concerns.  If you have a medical emergency, go to the nearest emergency room or call 911.  A surgeon from [facility] is always on call at the hospital ° ° °  [address], [address], [place], [place]  [address] ? ° [address] [address], [place], [place]   [address] °[phone] ? [phone] ? FAX [phone] °Web site: [email or web address] °

## 2019-01-26 NOTE — H&P (Signed)
Deborah Murray  Location: [facility] Patient #: [id number] DOB: 1960-05-25 Single / Language: Deborah Murray / Race: Black or African American Female  History of Present Illness  Patient words: Patient presents for evaluation of supraumbilical incisional hernia. She has history of gastric bypass done laparoscopically in [place] 10 years ago. Unfortunately she has not lost much weight from that. She's had a long-standing history of pain and bulge just above her umbilicus at the port site. CT last year showed a small incisional hernia at the port site with fatty tissue. No signs of obstruction. The area causes pain when she coughs and walks. It's been about the same size. Locations just above the umbilicus. No change in bowel or bladder function.  The patient is a 58 year old female.   Allergies  Penicillins Aspirin *ANALGESICS - NonNarcotic* Darvocet A500 *ANALGESICS - OPIOID* Allergies Reconciled  Medication History  Albuterol Sulfate HFA (108 (90 Base)MCG/ACT Aerosol Soln, Inhalation) Active. amLODIPine Besylate (5MG  Tablet, Oral) Active. Atorvastatin Calcium (20MG  Tablet, Oral) Active. Calcium (500MG  Tablet, Oral) Active. Cymbalta (20MG  Capsule DR Part, Oral) Active. Flonase (50MCG/DOSE Inhaler, Nasal) Active. Cortef (20MG  Tablet, Oral) Active. Xyzal (5MG  Tablet, Oral) Active. Levothyroxine Sodium (75MCG Tablet, Oral) Active. Linzess (145MCG Capsule, Oral) Active. Melatonin (Oral) Specific strength unknown - Active. Singulair (10MG  Tablet, Oral) Active. Xolair (150MG  For Solution, Subcutaneous) Active. Omeprazole (20MG  Tablet DR, Oral) Active. Zofran (4MG  Tablet, Oral) Active. Potassium (Oral) Specific strength unknown - Active. Zantac (150MG  Tablet, Oral) Active. Xarelto (20MG  Tablet, Oral) Active. Forteo (600MCG/2.4ML Solution, Subcutaneous) Active. Medications Reconciled     Review of Systems All other systems  negative  Vitals  12/09/2018 11:26 AM Weight: 244.2 lb Height: 62in Body Surface Area: 2.08 m Body Mass Index: 44.66 kg/m  Temp.: 97.15F  Pulse: 88 (Regular)  BP: 138/84 (Sitting, Left Arm, Standard)        Physical Exam   General Note: Walks with a cane due to severe arthritis of her back  Chest and Lung Exam Chest and lung exam reveals -quiet, even and easy respiratory effort with no use of accessory muscles and on auscultation, normal breath sounds, no adventitious sounds and normal vocal resonance. Inspection Chest Wall - Normal. Back - normal.  Cardiovascular Cardiovascular examination reveals -normal heart sounds, regular rate and rhythm with no murmurs and normal pedal pulses bilaterally.  Abdomen Note: Mildly obese. Small bulge is noted just above the umbilicus at a previous port site. No rebound or guarding. Difficult exam due to obesity.  Neurologic Neurologic evaluation reveals -alert and oriented x 3 with no impairment of recent or remote memory. Mental Status-Normal.  Musculoskeletal Note: Limited mobility secondary to back pain. Uses cane for mobility.    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 She is morbidly obese and her BMI indicates her risk of recurrence as well as over 30%. This hernia appears quite small on imaging and on exam. Recommend small open repair for this without that he will give her some time. General obese risk for complication I would caution against a laparoscopic approach in her. I explained the risk of recurrence and matter what repair when he was imaging showed a lower check make her high because of her overall poor health. Given the amount of pain she is having observation in this case is not going to be helpful nor is any attempt at weight loss going to be successful my opinion. She has failed bariatric surgery at  this point and is not reasonable for her to lose any weight to fix this. She understands this and might would like to proceed with surgery which will be an open repair with mesh. The risk of hernia repair include bleeding, infection, organ injury, bowel injury, bladder injury, nerve injury recurrent hernia, blood clots, worsening of underlying condition, chronic pain, mesh use, open surgery, death, and the need for other operations. Pt agrees to proceed  Current Plans You are being scheduled for surgery- Our schedulers will call you.  You should hear from our office's scheduling department within 5 working days about the location, date, and time of surgery. We try to make accommodations for patient's preferences in scheduling surgery, but sometimes the OR schedule or the surgeon's schedule prevents [place] from making those accommodations.  If you have not heard from our office [phone]) in 5 working days, call the office and ask for your surgeon's nurse.  If you have other questions about your diagnosis, plan, or surgery, call the office and ask for your surgeon's nurse.  Pt Education - Pamphlet Given - Hernia Surgery: discussed with patient and provided information. The anatomy & physiology of the abdominal wall was discussed. The pathophysiology of hernias was discussed. Natural history risks without surgery including progeressive enlargement, pain, incarceration, & strangulation was discussed. Contributors to complications such as smoking, obesity, diabetes, prior surgery, etc were discussed.  I feel the risks of no intervention will lead to serious problems that outweigh the operative risks; therefore, I recommended surgery to reduce and repair the hernia. I explained an open approach. I noted the probable use of mesh to patch and/or buttress the hernia repair  Risks such as bleeding, infection, abscess, need for further treatment, heart attack, death, and other risks were  discussed. I noted a good likelihood this will help address the problem. Goals of post-operative recovery were discussed as well. Possibility that this will not correct all symptoms was explained. I stressed the importance of low-impact activity, aggressive pain control, avoiding constipation, & not pushing through pain to minimize risk of post-operative chronic pain or injury. Possibility of reherniation especially with smoking, obesity, diabetes, immunosuppression, and other health conditions was discussed. We will work to minimize complications.  An educational handout further explaining the pathology & treatment options was given as well. Questions were answered. The patient expresses understanding & wishes to proceed with surgery.

## 2019-01-27 ENCOUNTER — Encounter (HOSPITAL_COMMUNITY): Payer: Self-pay | Admitting: Surgery

## 2019-01-31 ENCOUNTER — Other Ambulatory Visit: Payer: Self-pay | Admitting: Gastroenterology

## 2019-04-17 ENCOUNTER — Other Ambulatory Visit: Payer: Self-pay

## 2019-04-17 ENCOUNTER — Ambulatory Visit
Admission: EM | Admit: 2019-04-17 | Discharge: 2019-04-17 | Disposition: A | Discharge status: Home / Self Care | Payer: Medicare Other | Attending: Emergency Medicine | Admitting: Emergency Medicine

## 2019-04-17 DIAGNOSIS — M5441 Lumbago with sciatica, right side: Secondary | ICD-10-CM | POA: Diagnosis not present

## 2019-04-17 MED ORDER — PREDNISONE 20 MG PO TABS: 20.0000 mg | ORAL_TABLET | Freq: Two times a day (BID) | ORAL | 0 refills | Status: AC

## 2019-04-17 MED ORDER — TIZANIDINE HCL 2 MG PO CAPS: 2.0000 mg | ORAL_CAPSULE | Freq: Every evening | ORAL | 0 refills | Status: DC | PRN

## 2019-04-17 MED ORDER — METHYLPREDNISOLONE SODIUM SUCC 125 MG IJ SOLR
80.0000 mg | Freq: Once | INTRAMUSCULAR | Status: AC
  Administered 2019-04-17: 13:00:00 80 mg via INTRAMUSCULAR

## 2019-04-17 NOTE — ED Provider Notes (Signed)
[place]   YW:[id number] 04/17/19 Arrival Time: [id number]  CC: Back pain  SUBJECTIVE: History from: patient. Deborah Murray is a 59 y.o. female complains of right low back pain that began 2 weeks ago.  Denies a precipitating event or specific injury.  Localizes the pain to the right low back.  Describes the pain as intermittent and achy in character.  Has tried tylenol and 5 mg oxycodone without relief.  Symptoms are made worse with activity.  Reports hx of degenerative disc.  Denies fever, chills, erythema, ecchymosis, effusion, weakness, numbness and tingling, saddle paresthesias, loss of bowel or bladder function.      ROS: As per HPI.  All other pertinent ROS negative.     Past Medical History:  Diagnosis Date  . Adrenal abnormality ([place])   . Adrenal insufficiency ([place]) 2013   Tx at [facility]  . Anemia   . Arthritis   . Asthma   . Atrial fibrillation ([place])   . Chronic back pain   . COPD (chronic obstructive pulmonary disease) ([place])   . GERD (gastroesophageal reflux disease)   . Glaucoma   . Headache   . Hyperlipidemia   . Hypertension   . Hypothyroidism   . Lumbar radiculopathy   . Sepsis ([place]) 11/04/2015  . Syncope and collapse 05/11/11  adrenal insuffiency  treated at [facility]  . Thyroid disease   . Vocal cord dysfunction    Past Surgical History:  Procedure Laterality Date  . ABDOMINAL HYSTERECTOMY    . BACK SURGERY     disc   . CARDIAC CATHETERIZATION  2012 at [facility]  . CHOLECYSTECTOMY    . CHOLECYSTECTOMY, LAPAROSCOPIC    . CLOSED REDUCTION METACARPAL WITH PERCUTANEOUS PINNING Left 03/26/2016   Procedure: CLOSED REDUCTION METACARPAL WITH PERCUTANEOUS PINNING;  Surgeon: Leanora Cover, MD;  Location: [facility];  Service: Orthopedics;  Laterality: Left;  . COLONOSCOPY  2010   [place]: anal papilla, otherwise normal   . COLONOSCOPY WITH PROPOFOL N/A 03/03/2017   sigmoid divertivcula, 4 mm polyp in rectum (tubular adenoma), non-bleeding internal  hemorrhoids, surveillance in 7 years  . ESOPHAGOGASTRODUODENOSCOPY N/A 09/17/2015   Surgicall altered stomach. NO specimens collected. normal esophagus  . ESOPHAGOGASTRODUODENOSCOPY (EGD) WITH PROPOFOL N/A 03/03/2017   normal esophagus  . eye surg for glaucoma    . gastic by-pass  2008   Gastric [facility]-pass in [facility] at [facility]  . INCISIONAL HERNIA REPAIR N/A 01/26/2019   Procedure: REPAIR OF SUPRAUMBILICAL INCISIONAL HERNIA WITH MESH;  Surgeon: Erroll Luna, MD;  Location: [facility];  Service: General;  Laterality: N/A;  . right knee surg     arthroscopy  . SHOULDER SURGERY Bilateral    Allergies  Allergen Reactions  . Aspirin Shortness Of Breath and Palpitations  . Codeine Sulfate Shortness Of Breath and Palpitations  . Darvocet [Propoxyphene N-Acetaminophen] Shortness Of Breath and Palpitations  . Nsaids     Exacerbation of asthma  . Tramadol Shortness Of Breath  . Gabapentin Other (See Comments)    dizziness  . Penicillins Other (See Comments)    Has patient had a PCN reaction causing immediate rash, facial/tongue/throat swelling, SOB or lightheadedness with hypotension: No Has patient had a PCN reaction causing severe rash involving mucus membranes or skin necrosis: No Has patient had a PCN reaction that required hospitalization No Has patient had a PCN reaction occurring within the last 10 years: Yes If all of the above answers are "NO", then may proceed with Cephalosporin use.  No current facility-administered medications on file prior to encounter.   Current Outpatient Medications on File Prior to Encounter  Medication Sig Dispense Refill  . acetaminophen (TYLENOL) 500 MG tablet Take 1,000 mg every 6 (six) hours as needed by mouth (for pain.).    Marland Kitchen albuterol (PROVENTIL HFA;VENTOLIN HFA) 108 (90 BASE) MCG/ACT inhaler Inhale 2 puffs into the lungs every 4 (four) hours as needed for wheezing.     Marland Kitchen amLODipine (NORVASC) 10 MG tablet Take 10 mg by mouth daily.    Marland Kitchen atorvastatin  (LIPITOR) 20 MG tablet Take 20 mg by mouth every evening.     . brimonidine (ALPHAGAN) 0.2 % ophthalmic solution Place 1 drop into both eyes 2 (two) times daily.    . Calcium Carb-Ergocalciferol 500-200 MG-UNIT TABS Take 1 tablet by mouth daily before breakfast.     . DULoxetine (CYMBALTA) 60 MG capsule Take 60 mg by mouth daily.    Marland Kitchen EPINEPHrine 0.3 mg/0.3 mL IJ SOAJ injection Inject 0.3 mg into the muscle as needed.    . flunisolide (NASALIDE) 25 MCG/ACT (0.025%) SOLN Place 2 sprays into the nose daily as needed for allergies.    . hydrocortisone (CORTEF) 10 MG tablet Take 5-10 mg by mouth See admin instructions. Take 1 tablet (10 mg) by mouth in the morning & take 0.5 tablet (5 mg) by mouth in the afternoon.    Marland Kitchen levocetirizine (XYZAL) 5 MG tablet Take 5 mg every evening by mouth.    . levothyroxine (SYNTHROID) 50 MCG tablet Take 50 mcg by mouth daily before breakfast.    . LINZESS 145 MCG CAPS capsule TAKE ONE CAPSULE BY MOUTH DAILY BEFORE BREAKFAST. (Patient taking differently: Take 145 mcg by mouth daily as needed (constipation.). ) 30 capsule 11  . Melatonin 5 MG TABS Take 5 mg by mouth at bedtime.    . metFORMIN (GLUCOPHAGE-XR) 500 MG 24 hr tablet Take 500 mg by mouth 2 (two) times daily.    . montelukast (SINGULAIR) 10 MG tablet Take 10 mg by mouth at bedtime.    Marland Kitchen omalizumab (XOLAIR) 150 MG injection Inject 300 mg into the skin every 30 (thirty) days.    Marland Kitchen omeprazole (PRILOSEC) 20 MG capsule Take 1 capsule (20 mg total) by mouth daily. 30 minutes before breakfast. 90 capsule 3  . ondansetron (ZOFRAN-ODT) 4 MG disintegrating tablet TAKE 1 TABLET BY MOUTH EVERY 8 HOURS AS NEEDED. 60 tablet 1  . oxyCODONE (OXY IR/ROXICODONE) 5 MG immediate release tablet Take 5 mg by mouth 3 (three) times daily as needed for pain.    Marland Kitchen oxyCODONE (OXY IR/ROXICODONE) 5 MG immediate release tablet Take 1 tablet (5 mg total) by mouth every 6 (six) hours as needed for severe pain. 15 tablet 0  . potassium  chloride SA (KLOR-CON) 20 MEQ tablet Take 20 mEq by mouth daily.    . pregabalin (LYRICA) 50 MG capsule Take 50 mg by mouth 3 (three) times daily.    . ranitidine (ZANTAC) 150 MG tablet Take 150 mg by mouth 2 (two) times daily.    . rivaroxaban (XARELTO) 20 MG TABS tablet Take 20 mg by mouth every evening.     . SYMBICORT 80-4.5 MCG/ACT inhaler Inhale 2 puffs into the lungs 2 (two) times daily.    . VOLTAREN 1 % GEL Apply 1 application 4 (four) times daily topically.      Social History   Socioeconomic History  . Marital status: Single    Spouse name: Not on file  .  Number of children: Not on file  . Years of education: Not on file  . Highest education level: Not on file  Occupational History  . Occupation: disability   Tobacco Use  . Smoking status: Never Smoker  . Smokeless tobacco: Never Used  Substance and Sexual Activity  . Alcohol use: No  . Drug use: No  . Sexual activity: Never  Other Topics Concern  . Not on file  Social History Narrative  . Not on file   Social Determinants of Health   Financial Resource Strain:   . Difficulty of Paying Living Expenses: Not on file  Food Insecurity:   . Worried About Charity fundraiser in the Last Year: Not on file  . Ran Out of Food in the Last Year: Not on file  Transportation Needs:   . Lack of Transportation (Medical): Not on file  . Lack of Transportation (Non-Medical): Not on file  Physical Activity:   . Days of Exercise per Week: Not on file  . Minutes of Exercise per Session: Not on file  Stress:   . Feeling of Stress : Not on file  Social Connections:   . Frequency of Communication with Friends and Family: Not on file  . Frequency of Social Gatherings with Friends and Family: Not on file  . Attends Religious Services: Not on file  . Active Member of Clubs or Organizations: Not on file  . Attends Archivist Meetings: Not on file  . Marital Status: Not on file  Intimate Partner Violence:   . Fear of  Current or Ex-Partner: Not on file  . Emotionally Abused: Not on file  . Physically Abused: Not on file  . Sexually Abused: Not on file   Family History  Problem Relation Age of Onset  . Heart attack Mother   . Hypertension Mother   . Heart attack Father   . Asthma Father   . Hyperlipidemia Father   . Hypertension Father   . Heart attack Sister   . Arrhythmia Sister   . Asthma Sister   . Hyperlipidemia Sister   . Hypertension Sister   . Asthma Brother   . Hypertension Brother   . Colon cancer Neg Hx     OBJECTIVE:  Vitals:   04/17/19 1302  BP: 104/72  Pulse: (!) 54  Resp: 18  Temp: (!) 97.3 F (36.3 C)  TempSrc: Oral  SpO2: 98%    General appearance: ALERT; in no acute distress.  Head: NCAT ENT: PERRL, EOMI grossly; oropharynx clear Lungs: Normal respiratory effort; CTAB CV: RRR Musculoskeletal: Back   Inspection: Skin warm, dry, clear and intact without obvious erythema, effusion, or ecchymosis.  Palpation: TTP over RT low back with palpable spasm ROM: FROM active and passive Strength: 5/5 shld abduction, 5/5 shld adduction, 5/5 elbow flexion, 5/5 elbow extension, 5/5 grip strength, 5/5 hip flexion, 5/5 knee abduction, 5/5 knee adduction, 5/5 knee flexion, 5/5 knee extension Skin: warm and dry Neurologic: Ambulates with a cane; Sensation intact about the upper/ lower extremities Psychological: alert and cooperative; normal mood and affect  ASSESSMENT & PLAN:  1. Acute right-sided low back pain with right-sided sciatica     Meds ordered this encounter  Medications  . predniSONE (DELTASONE) 20 MG tablet    Sig: Take 1 tablet (20 mg total) by mouth 2 (two) times daily with a meal for 5 days.    Dispense:  10 tablet    Refill:  0    Order Specific Question:  Supervising Provider    Answer:   Raylene Everts [id number]  . tizanidine (ZANAFLEX) 2 MG capsule    Sig: Take 1 capsule (2 mg total) by mouth at bedtime as needed for muscle spasms.    Dispense:   15 capsule    Refill:  0    Order Specific Question:   Supervising Provider    Answer:   Raylene Everts [id number]  . methylPREDNISolone sodium succinate (SOLU-MEDROL) 125 mg/2 mL injection 80 mg   Steroid shot given in office Continue conservative management of rest, ice, heat, and gentle stretches Prednisone prescribed.  Take as directed and to completion Zanaflex prescribed.  Take as needed for muscle spasm.  Avoid taking prior to driving or operating heavy machinery as this may cause drowsiness Follow up with PCP if symptoms persist Return or go to the ER if you have any new or worsening symptoms (fever, chills, chest pain, abdominal pain, changes in bowel or bladder habits, pain radiating into lower legs, etc...)   Reviewed expectations re: course of current medical issues. Questions answered. Outlined signs and symptoms indicating need for more acute intervention. Patient verbalized understanding. After Visit Summary given.    Lestine Box, PA-C 04/17/19 1333

## 2019-04-17 NOTE — Discharge Instructions (Signed)
Steroid shot given in office Continue conservative management of rest, ice, heat, and gentle stretches Prednisone prescribed.  Take as directed and to completion Zanaflex prescribed.  Take as needed for muscle spasm.  Avoid taking prior to driving or operating heavy machinery as this may cause drowsiness Follow up with PCP if symptoms persist Return or go to the ER if you have any new or worsening symptoms (fever, chills, chest pain, abdominal pain, changes in bowel or bladder habits, pain radiating into lower legs, etc...)

## 2019-04-17 NOTE — ED Triage Notes (Addendum)
Pt presents to UC w/ c/o lower right back pain on and off x2 weeks. Pt states pain goes down her right leg. Pt took tylenol and 5mg  oxycodone today w/o relief. Denies injury to back or legs.

## 2019-05-25 ENCOUNTER — Ambulatory Visit: Payer: Medicare Other | Admitting: Gastroenterology

## 2019-05-25 ENCOUNTER — Ambulatory Visit (INDEPENDENT_AMBULATORY_CARE_PROVIDER_SITE_OTHER): Payer: Medicare Other | Admitting: Gastroenterology

## 2019-05-25 ENCOUNTER — Other Ambulatory Visit: Payer: Self-pay

## 2019-05-25 ENCOUNTER — Encounter: Payer: Self-pay | Admitting: Gastroenterology

## 2019-05-25 VITALS — BP 109/76 | HR 61 | Temp 98.8°F | Wt 220.4 lb

## 2019-05-25 DIAGNOSIS — K219 Gastro-esophageal reflux disease without esophagitis: Secondary | ICD-10-CM

## 2019-05-25 DIAGNOSIS — K5900 Constipation, unspecified: Secondary | ICD-10-CM

## 2019-05-25 DIAGNOSIS — K912 Postsurgical malabsorption, not elsewhere classified: Secondary | ICD-10-CM | POA: Diagnosis not present

## 2019-05-25 NOTE — Progress Notes (Signed)
Cc'ed to pcp °

## 2019-05-25 NOTE — Progress Notes (Signed)
Referring Provider: Sol Passer, MD Primary Care Physician:  Sol Passer, MD Primary GI: Dr. Gala Romney   Chief Complaint  Patient presents with  . Follow-up    HPI:   Deborah Murray is a 60 y.o. 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 Colonoscopy Nov 2018 with sigmoid diverticula, 4 mm polyp in rectum (tubular adenoma), non-bleeding internal hemorrhoids, surveillance in 7 years. EGD with normal esophagus, s/p hemigastrectomy.   Constipation: Linzess 145 mcg as needed. Started on metformin to help with weight loss, and she has needed Linzess less now.   GERD: omeprazole once per day. Controlled.   Due to abdominal pain and concern for hernia, patient was referred to [facility] at last visit. She underwent incisional hernia repair with mesh placement in Oct 2020 by Dr. Brantley Stage. Had a wonderful experience. No further abdominal pain. No rectal bleeding. No dysphagia.   Needs to lose 20 more lbs and then can have back surgery. Has lost almost 30 lbs thus far. Back pain and bilateral knees need surgery.    Past Medical History:  Diagnosis Date  . Adrenal abnormality ([place])   . Adrenal insufficiency ([place]) 2013   Tx at [facility]  . Anemia   . Arthritis   . Asthma   . Atrial fibrillation ([place])   . Chronic back pain   . COPD (chronic obstructive pulmonary disease) ([place])   . GERD (gastroesophageal reflux disease)   . Glaucoma   . Headache   . Hyperlipidemia   . Hypertension   . Hypothyroidism   . Lumbar radiculopathy   . Sepsis ([place]) 11/04/2015  . Syncope and collapse 05/11/11  adrenal insuffiency  treated at [facility]  . Thyroid disease   . Vocal cord dysfunction     Past Surgical History:  Procedure Laterality Date  . ABDOMINAL HYSTERECTOMY    . BACK SURGERY     disc   . CARDIAC CATHETERIZATION  2012 at [facility]  . CHOLECYSTECTOMY    .  CHOLECYSTECTOMY, LAPAROSCOPIC    . CLOSED REDUCTION METACARPAL WITH PERCUTANEOUS PINNING Left 03/26/2016   Procedure: CLOSED REDUCTION METACARPAL WITH PERCUTANEOUS PINNING;  Surgeon: Leanora Cover, MD;  Location: [place];  Service: Orthopedics;  Laterality: Left;  . COLONOSCOPY  2010   [place]: anal papilla, otherwise normal   . COLONOSCOPY WITH PROPOFOL N/A 03/03/2017   sigmoid divertivcula, 4 mm polyp in rectum (tubular adenoma), non-bleeding internal hemorrhoids, surveillance in 7 years  . ESOPHAGOGASTRODUODENOSCOPY N/A 09/17/2015   Surgicall altered stomach. NO specimens collected. normal esophagus  . ESOPHAGOGASTRODUODENOSCOPY (EGD) WITH PROPOFOL N/A 03/03/2017   normal esophagus  . eye surg for glaucoma    . gastic by-pass  2008   Gastric by-pass in 2008 at [facility]  . INCISIONAL HERNIA REPAIR N/A 01/26/2019   Procedure: REPAIR OF SUPRAUMBILICAL INCISIONAL HERNIA WITH MESH;  Surgeon: Erroll Luna, MD;  Location: [place];  Service: General;  Laterality: N/A;  . right knee surg     arthroscopy  . SHOULDER SURGERY Bilateral     Current Outpatient Medications  Medication Sig Dispense Refill  . acetaminophen (TYLENOL) 500 MG tablet Take 1,000 mg every 6 (six) hours as needed by mouth (for pain.).    Marland Kitchen albuterol (PROVENTIL HFA;VENTOLIN HFA) 108 (90 BASE) MCG/ACT inhaler Inhale 2 puffs into the lungs every 4 (four) hours as needed for wheezing.     Marland Kitchen  amLODipine (NORVASC) 10 MG tablet Take 10 mg by mouth daily.    Marland Kitchen atorvastatin (LIPITOR) 20 MG tablet Take 20 mg by mouth every evening.     . brimonidine (ALPHAGAN) 0.2 % ophthalmic solution Place 1 drop into both eyes 2 (two) times daily.    . Calcium Carb-Ergocalciferol 500-200 MG-UNIT TABS Take 1 tablet by mouth daily before breakfast.     . DULoxetine (CYMBALTA) 60 MG capsule Take 60 mg by mouth daily.    . flunisolide (NASALIDE) 25 MCG/ACT (0.025%) SOLN Place 2 sprays into the nose daily as needed for allergies.     . hydrocortisone (CORTEF) 10 MG tablet Take 5-10 mg by mouth See admin instructions. Take 1 tablet (10 mg) by mouth in the morning & take 0.5 tablet (5 mg) by mouth in the afternoon.    Marland Kitchen levocetirizine (XYZAL) 5 MG tablet Take 5 mg every evening by mouth.    . levothyroxine (SYNTHROID) 50 MCG tablet Take 50 mcg by mouth daily before breakfast.    . LINZESS 145 MCG CAPS capsule TAKE ONE CAPSULE BY MOUTH DAILY BEFORE BREAKFAST. (Patient taking differently: Take 145 mcg by mouth daily as needed (constipation.). ) 30 capsule 11  . Melatonin 5 MG TABS Take 5 mg by mouth at bedtime.    . metFORMIN (GLUCOPHAGE-XR) 500 MG 24 hr tablet Take 500 mg by mouth 2 (two) times daily.    . montelukast (SINGULAIR) 10 MG tablet Take 10 mg by mouth at bedtime.    Marland Kitchen omeprazole (PRILOSEC) 20 MG capsule Take 1 capsule (20 mg total) by mouth daily. 30 minutes before breakfast. 90 capsule 3  . ondansetron (ZOFRAN-ODT) 4 MG disintegrating tablet TAKE 1 TABLET BY MOUTH EVERY 8 HOURS AS NEEDED. 60 tablet 1  . oxyCODONE (OXY IR/ROXICODONE) 5 MG immediate release tablet Take 5 mg by mouth 3 (three) times daily as needed for pain.    Marland Kitchen oxyCODONE (OXY IR/ROXICODONE) 5 MG immediate release tablet Take 1 tablet (5 mg total) by mouth every 6 (six) hours as needed for severe pain. 15 tablet 0  . potassium chloride SA (KLOR-CON) 20 MEQ tablet Take 20 mEq by mouth daily.    . pregabalin (LYRICA) 50 MG capsule Take 50 mg by mouth 3 (three) times daily.    . rivaroxaban (XARELTO) 20 MG TABS tablet Take 20 mg by mouth every evening.     . SYMBICORT 80-4.5 MCG/ACT inhaler Inhale 2 puffs into the lungs 2 (two) times daily.    . tizanidine (ZANAFLEX) 2 MG capsule Take 1 capsule (2 mg total) by mouth at bedtime as needed for muscle spasms. 15 capsule 0  . VOLTAREN 1 % GEL Apply 1 application 4 (four) times daily topically.     . ranitidine (ZANTAC) 150 MG tablet Take 150 mg by mouth 2 (two) times daily.     No current  facility-administered medications for this visit.    Allergies as of 05/25/2019 - Review Complete 05/25/2019  Allergen Reaction Noted  . Aspirin Shortness Of Breath and Palpitations 05/11/2011  . Codeine sulfate Shortness Of Breath and Palpitations 05/11/2011  . Darvocet [propoxyphene n-acetaminophen] Shortness Of Breath and Palpitations 05/11/2011  . Nsaids  02/15/2013  . Tramadol Shortness Of Breath 10/30/2014  . Gabapentin Other (See Comments) 02/02/2017  . Penicillins Other (See Comments) 05/11/2011    Family History  Problem Relation Age of Onset  . Heart attack Mother   . Hypertension Mother   . Heart attack Father   . Asthma  Father   . Hyperlipidemia Father   . Hypertension Father   . Heart attack Sister   . Arrhythmia Sister   . Asthma Sister   . Hyperlipidemia Sister   . Hypertension Sister   . Asthma Brother   . Hypertension Brother   . Colon cancer Neg Hx     Social History   Socioeconomic History  . Marital status: Single    Spouse name: Not on file  . Number of children: Not on file  . Years of education: Not on file  . Highest education level: Not on file  Occupational History  . Occupation: disability   Tobacco Use  . Smoking status: Never Smoker  . Smokeless tobacco: Never Used  Substance and Sexual Activity  . Alcohol use: No  . Drug use: No  . Sexual activity: Never  Other Topics Concern  . Not on file  Social History Narrative  . Not on file   Social Determinants of Health   Financial Resource Strain:   . Difficulty of Paying Living Expenses: Not on file  Food Insecurity:   . Worried About Charity fundraiser in the Last Year: Not on file  . Ran Out of Food in the Last Year: Not on file  Transportation Needs:   . Lack of Transportation (Medical): Not on file  . Lack of Transportation (Non-Medical): Not on file  Physical Activity:   . Days of Exercise per Week: Not on file  . Minutes of Exercise per Session: Not on file  Stress:     . Feeling of Stress : Not on file  Social Connections:   . Frequency of Communication with Friends and Family: Not on file  . Frequency of Social Gatherings with Friends and Family: Not on file  . Attends Religious Services: Not on file  . Active Member of Clubs or Organizations: Not on file  . Attends Archivist Meetings: Not on file  . Marital Status: Not on file    Review of Systems: Gen: see HPI CV: Denies chest pain, palpitations, syncope, peripheral edema, and claudication. Resp: Denies dyspnea at rest, cough, wheezing, coughing up blood, and pleurisy. GI: see HPI Derm: Denies rash, itching, dry skin Psych: Denies depression, anxiety, memory loss, confusion. No homicidal or suicidal ideation.  Heme: Denies bruising, bleeding, and enlarged lymph nodes.  Physical Exam: BP 109/76   Pulse 61   Temp 98.8 F (37.1 C) (Temporal)   Wt 220 lb 6.4 oz (100 kg)   BMI 40.31 kg/m  General:   Alert and oriented. No distress noted. Pleasant and cooperative.  Head:  Normocephalic and atraumatic. Eyes:  Conjuctiva clear without scleral icterus. Abdomen:  +BS, soft, non-tender and non-distended. Exam in chair as patient with decreased mobility.  Msk:  Symmetrical without gross deformities. Normal posture. Extremities:  Without edema. Neurologic:  Alert and  oriented x4 Psych:  Alert and cooperative. Normal mood and affect.  ASSESSMENT: HARTLEY BURY is a 59 y.o. 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 Colonoscopy Nov 2018 with sigmoid diverticula, 4 mm polyp in rectum (tubular adenoma), non-bleeding internal hemorrhoids, surveillance in 7 years. EGD with normal esophagus, s/p hemigastrectomy.Doing well today from a GI standpoint.  Constipation: Linzess 145 mcg just as needed, which is working well for her.  IDA: update labs. Last labs WNL. No  overt GI bleeding  GERD: controlled  with omeprazole once daily.     PLAN:   Continue omeprazole daily  Continue novel dosing of Linzess 145 mcg   Labs ordered today (CBC, CMP, iron studies). Patient has also requested Vit D and lipid panel; labs will be forward to PCP  Return in 6-8 months  Next colonoscopy in [place], PhD, [facility]

## 2019-05-25 NOTE — Patient Instructions (Signed)
I have ordered routine blood work for you.  We will see you in 6-8 months or sooner if needed!  Great work on the [employer] loss!  I enjoyed seeing you again today! As you know, I value our relationship and want to provide genuine, compassionate, and quality care. I welcome your feedback. If you receive a survey regarding your visit,  I greatly appreciate you taking time to fill this out. See you next time!  Annitta Needs, PhD, ANP-BC [facility]

## 2019-05-27 LAB — LIPID PANEL
Cholesterol: 160 mg/dL (ref <200)
HDL: 65 mg/dL (ref >=50)
LDL Cholesterol (Calc): 80 mg/dL (calc)
Non-HDL Cholesterol (Calc): 95 mg/dL (calc) (ref <130)
Total CHOL/HDL Ratio: 2.5 (calc) (ref <5.0)
Triglycerides: 74 mg/dL (ref <150)

## 2019-05-27 LAB — COMPLETE METABOLIC PANEL WITH GFR
AG Ratio: 1.8 (calc) (ref 1.0–2.5)
ALT: 12 U/L (ref 6–29)
AST: 17 U/L (ref 10–35)
Albumin: 3.4 g/dL — ABNORMAL LOW (ref 3.6–5.1)
Alkaline phosphatase (APISO): 47 U/L (ref 37–153)
BUN: 12 mg/dL (ref 7–25)
CO2: 25 mmol/L (ref 20–32)
Calcium: 8.8 mg/dL (ref 8.6–10.4)
Chloride: 113 mmol/L — ABNORMAL HIGH (ref 98–110)
Creat: 0.92 mg/dL (ref 0.50–0.99)
GFR, Est African American: 78 mL/min/{1.73_m2} (ref >=60)
GFR, Est Non African American: 68 mL/min/{1.73_m2} (ref >=60)
Globulin: 1.9 g/dL (calc) (ref 1.9–3.7)
Glucose, Bld: 74 mg/dL (ref 65–99)
Potassium: 4.1 mmol/L (ref 3.5–5.3)
Sodium: 143 mmol/L (ref 135–146)
Total Bilirubin: 0.4 mg/dL (ref 0.2–1.2)
Total Protein: 5.3 g/dL — ABNORMAL LOW (ref 6.1–8.1)

## 2019-05-27 LAB — CBC WITH DIFFERENTIAL/PLATELET
Absolute Monocytes: 449 cells/uL (ref 200–950)
Basophils Absolute: 90 cells/uL (ref 0–200)
Basophils Relative: 2.3 %
Eosinophils Absolute: 78 cells/uL (ref 15–500)
Eosinophils Relative: 2 %
HCT: 37.9 % (ref 35.0–45.0)
Hemoglobin: 12.3 g/dL (ref 11.7–15.5)
Lymphs Abs: 2270 cells/uL (ref 850–3900)
MCH: 29.8 pg (ref 27.0–33.0)
MCHC: 32.5 g/dL (ref 32.0–36.0)
MCV: 91.8 fL (ref 80.0–100.0)
MPV: 9.7 fL (ref 7.5–12.5)
Monocytes Relative: 11.5 %
Neutro Abs: 1014 cells/uL — ABNORMAL LOW (ref 1500–7800)
Neutrophils Relative %: 26 %
Platelets: 342 10*3/uL (ref 140–400)
RBC: 4.13 10*6/uL (ref 3.80–5.10)
RDW: 12.4 % (ref 11.0–15.0)
Total Lymphocyte: 58.2 %
WBC: 3.9 10*3/uL (ref 3.8–10.8)

## 2019-05-27 LAB — IRON,TIBC AND FERRITIN PANEL
%SAT: 35 % (calc) (ref 16–45)
Ferritin: 76 ng/mL (ref 16–232)
Iron: 84 ug/dL (ref 45–160)
TIBC: 241 mcg/dL (calc) — ABNORMAL LOW (ref 250–450)

## 2019-05-27 LAB — VITAMIN D 25 HYDROXY (VIT D DEFICIENCY, FRACTURES): Vit D, 25-Hydroxy: 45 ng/mL (ref 30–100)

## 2019-07-18 ENCOUNTER — Encounter (HOSPITAL_COMMUNITY): Payer: Self-pay | Admitting: *Deleted

## 2019-07-18 ENCOUNTER — Emergency Department (HOSPITAL_COMMUNITY)
Admission: EM | Admit: 2019-07-18 | Discharge: 2019-07-18 | Disposition: A | Discharge status: Home / Self Care | Payer: Medicare Other | Attending: Emergency Medicine | Admitting: Emergency Medicine

## 2019-07-18 ENCOUNTER — Other Ambulatory Visit: Payer: Self-pay

## 2019-07-18 DIAGNOSIS — R55 Syncope and collapse: Secondary | ICD-10-CM | POA: Diagnosis present

## 2019-07-18 DIAGNOSIS — J449 Chronic obstructive pulmonary disease, unspecified: Secondary | ICD-10-CM | POA: Diagnosis not present

## 2019-07-18 DIAGNOSIS — Z7901 Long term (current) use of anticoagulants: Secondary | ICD-10-CM | POA: Diagnosis not present

## 2019-07-18 DIAGNOSIS — I10 Essential (primary) hypertension: Secondary | ICD-10-CM | POA: Insufficient documentation

## 2019-07-18 DIAGNOSIS — Z7984 Long term (current) use of oral hypoglycemic drugs: Secondary | ICD-10-CM | POA: Diagnosis not present

## 2019-07-18 DIAGNOSIS — E039 Hypothyroidism, unspecified: Secondary | ICD-10-CM | POA: Insufficient documentation

## 2019-07-18 DIAGNOSIS — I951 Orthostatic hypotension: Secondary | ICD-10-CM | POA: Insufficient documentation

## 2019-07-18 DIAGNOSIS — Z79899 Other long term (current) drug therapy: Secondary | ICD-10-CM | POA: Diagnosis not present

## 2019-07-18 LAB — COMPREHENSIVE METABOLIC PANEL
ALT: 17 U/L (ref 0–44)
AST: 26 U/L (ref 15–41)
Albumin: 3.6 g/dL (ref 3.5–5.0)
Alkaline Phosphatase: 44 U/L (ref 38–126)
Anion gap: 9 (ref 5–15)
BUN: 18 mg/dL (ref 8–23)
CO2: 22 mmol/L (ref 22–32)
Calcium: 9.3 mg/dL (ref 8.9–10.3)
Chloride: 110 mmol/L (ref 98–111)
Creatinine, Ser: 0.85 mg/dL (ref 0.44–1.00)
GFR calc Af Amer: >60 mL/min (ref >60)
GFR calc non Af Amer: >60 mL/min (ref >60)
Glucose, Bld: 81 mg/dL (ref 70–99)
Potassium: 4.1 mmol/L (ref 3.5–5.1)
Sodium: 141 mmol/L (ref 135–145)
Total Bilirubin: 0.5 mg/dL (ref 0.3–1.2)
Total Protein: 6.2 g/dL — ABNORMAL LOW (ref 6.5–8.1)

## 2019-07-18 LAB — CBC WITH DIFFERENTIAL/PLATELET
Abs Immature Granulocytes: 0.04 10*3/uL (ref 0.00–0.07)
Basophils Absolute: 0.1 10*3/uL (ref 0.0–0.1)
Basophils Relative: 2 %
Eosinophils Absolute: 0.2 10*3/uL (ref 0.0–0.5)
Eosinophils Relative: 3 %
HCT: 39.5 % (ref 36.0–46.0)
Hemoglobin: 12.4 g/dL (ref 12.0–15.0)
Immature Granulocytes: 1 %
Lymphocytes Relative: 38 %
Lymphs Abs: 1.8 10*3/uL (ref 0.7–4.0)
MCH: 29 pg (ref 26.0–34.0)
MCHC: 31.4 g/dL (ref 30.0–36.0)
MCV: 92.3 fL (ref 80.0–100.0)
Monocytes Absolute: 0.5 10*3/uL (ref 0.1–1.0)
Monocytes Relative: 11 %
Neutro Abs: 2.1 10*3/uL (ref 1.7–7.7)
Neutrophils Relative %: 45 %
Platelets: 289 10*3/uL (ref 150–400)
RBC: 4.28 MIL/uL (ref 3.87–5.11)
RDW: 15.1 % (ref 11.5–15.5)
WBC: 4.7 10*3/uL (ref 4.0–10.5)
nRBC: 0 % (ref 0.0–0.2)

## 2019-07-18 MED ORDER — LACTATED RINGERS IV BOLUS
1000.0000 mL | Freq: Once | INTRAVENOUS | Status: AC
  Administered 2019-07-18: 1000 mL via INTRAVENOUS

## 2019-07-18 NOTE — ED Notes (Signed)
Pt visitor told repeatedly to please stay in room, pt visitor walking around in the halls and demanding to know how much longer patient will be here in ED. Pt visitor reminded that visitor policy is to stay in the room.

## 2019-07-18 NOTE — ED Triage Notes (Addendum)
States she went to [facility] yesterday for allergy shot, was told bp was low, states she passed out twice yesterday, Has not had any syncopal episodes today, wants bp checked. Now adds pain in rt side going down leg from "bad disc"

## 2019-07-18 NOTE — ED Notes (Signed)
An After Visit Summary was printed and given to the patient. Discharge instructions given and no further questions at this time.  Pt leaving with sister.  

## 2019-07-18 NOTE — ED Notes (Signed)
ED Provider at bedside. 

## 2019-07-18 NOTE — ED Notes (Signed)
Patient reports dizziness when standing for orthostatic VS.

## 2019-07-18 NOTE — Discharge Instructions (Addendum)
Your labs today were reassuring. Your blood counts and electrolytes were all normal. Your kidney function and liver function were also normal.  You should keep a log of your blood pressure at least once per day over the next week and call your primary care provider to make an appointment for follow-up.  You should hold your amlodipine until you can follow-up with your regular doctor.

## 2019-07-18 NOTE — ED Provider Notes (Signed)
Care assumed from [facility], [place]. See her note for full H&P.   Per her note, "Deborah Murray is a 59 y.o. female with history of adrenal insufficiency on chronic steroids, A. fib on Xarelto, hypertension who presents with syncope and hypotension.  Patient states that she went to [facility] yesterday to have her monthly allergy shots and they would not do it because her blood pressure was too low.  She states that systolic was in the [id number].  She is advised to come to the ED to get checked out.  She states that she did feel dizzy yesterday and had 2 syncopal episodes at home.  She states that they had occurred after she got up from a sitting position.  She denies any injury from the fall.  She denies any recent illness.  No fever, chills, headache, chest pain, shortness of breath, cough, abdominal pain, nausea, vomiting, diarrhea, blood in the stool, urinary symptoms.  She has been taking her medicines as prescribed.  She is on amlodipine 10 mg and did not take her blood pressure medicine today.  She states that she has lost 40 pounds intentionally over the past couple of months because she needs to have surgery on a herniated disc in her back.  She states she feels fine at rest but gets dizzy when she stands up."  Physical Exam  BP (!) 143/103   Pulse 60   Temp 98 F (36.7 C)   Resp 14   Ht 5\' 2"  (1.575 m)   Wt 96.6 kg   SpO2 98%   BMI 38.96 kg/m   Physical Exam Vitals and nursing note reviewed.  Constitutional:      General: She is not in acute distress.    Appearance: She is well-developed.  HENT:     Head: Normocephalic and atraumatic.  Eyes:     Conjunctiva/sclera: Conjunctivae normal.  Cardiovascular:     Rate and Rhythm: Normal rate.  Pulmonary:     Effort: Pulmonary effort is normal.  Musculoskeletal:        General: Normal range of motion.     Cervical back: Neck supple.  Skin:    General: Skin is warm and dry.  Neurological:     Mental Status: She is alert.       ED  Course/Procedures     Procedures  Results for orders placed or performed during the hospital encounter of 07/18/19  CBC with Differential  Result Value Ref Range   WBC 4.7 4.0 - 10.5 K/uL   RBC 4.28 3.87 - 5.11 MIL/uL   Hemoglobin 12.4 12.0 - 15.0 g/dL   HCT 39.5 36.0 - 46.0 %   MCV 92.3 80.0 - 100.0 fL   MCH 29.0 26.0 - 34.0 pg   MCHC 31.4 30.0 - 36.0 g/dL   RDW 15.1 11.5 - 15.5 %   Platelets 289 150 - 400 K/uL   nRBC 0.0 0.0 - 0.2 %   Neutrophils Relative % 45 %   Neutro Abs 2.1 1.7 - 7.7 K/uL   Lymphocytes Relative 38 %   Lymphs Abs 1.8 0.7 - 4.0 K/uL   Monocytes Relative 11 %   Monocytes Absolute 0.5 0.1 - 1.0 K/uL   Eosinophils Relative 3 %   Eosinophils Absolute 0.2 0.0 - 0.5 K/uL   Basophils Relative 2 %   Basophils Absolute 0.1 0.0 - 0.1 K/uL   Immature Granulocytes 1 %   Abs Immature Granulocytes 0.04 0.00 - 0.07 K/uL  Comprehensive metabolic panel  Result Value Ref Range   Sodium 141 135 - 145 mmol/L   Potassium 4.1 3.5 - 5.1 mmol/L   Chloride 110 98 - 111 mmol/L   CO2 22 22 - 32 mmol/L   Glucose, Bld 81 70 - 99 mg/dL   BUN 18 8 - 23 mg/dL   Creatinine, Ser 0.85 0.44 - 1.00 mg/dL   Calcium 9.3 8.9 - 10.3 mg/dL   Total Protein 6.2 (L) 6.5 - 8.1 g/dL   Albumin 3.6 3.5 - 5.0 g/dL   AST 26 15 - 41 U/L   ALT 17 0 - 44 U/L   Alkaline Phosphatase 44 38 - 126 U/L   Total Bilirubin 0.5 0.3 - 1.2 mg/dL   GFR calc non Af Amer >60 >60 mL/min   GFR calc Af Amer >60 >60 mL/min   Anion gap 9 5 - 15   No results found.   MDM   59 year old female presenting for evaluation of hypotension that was noted yesterday when she was at her allergy office.  EKG benign.  CBC without anemia or leukocytosis. CMP nonacute  She had some dizziness with standing and was given IV fluids.  Following this she was able to ambulate to the bathroom and back and her dizziness had resolved.  Blood pressure is improved today after patient held her amlodipine.  She does note that she has  intentionally lost about 40 pounds recently due to wanting to get back surgery.  During this time she has not had her blood pressure medications titrated.  This is likely the cause of her hypotension and other symptoms which occurred yesterday.  At this time come patient was advised to hold her blood pressure medications and monitor blood pressures at home.  She will keep a log and follow-up with her PCP in regards to blood pressure and whether or not she needs to restart medications or start at a lower dose.  Advised on return precautions.  She voiced understanding of the plan and reasons to return.  All questions answered.  Patient stable for discharge.     Rodney Booze, PA-C 07/18/19 1748    Hayden Rasmussen, MD 07/19/19 1504

## 2019-07-18 NOTE — ED Provider Notes (Addendum)
[facility] DEPT Provider Note   CSN: [id number] Arrival date & time: 07/18/19  1316     History Chief Complaint  Patient presents with  . Hypotension    Deborah Murray is a 59 y.o. female with history of adrenal insufficiency on chronic steroids, A. fib on Xarelto, hypertension who presents with syncope and hypotension.  Patient states that she went to [facility] yesterday to have her monthly allergy shots and they would not do it because her blood pressure was too low.  She states that systolic was in the [id number].  She is advised to come to the ED to get checked out.  She states that she did feel dizzy yesterday and had 2 syncopal episodes at home.  She states that they had occurred after she got up from a sitting position.  She denies any injury from the fall.  She denies any recent illness.  No fever, chills, headache, chest pain, shortness of breath, cough, abdominal pain, nausea, vomiting, diarrhea, blood in the stool, urinary symptoms.  She has been taking her medicines as prescribed.  She is on amlodipine 10 mg and did not take her blood pressure medicine today.  She states that she has lost 40 pounds intentionally over the past couple of months because she needs to have surgery on a herniated disc in her back.  She states she feels fine at rest but gets dizzy when she stands up.  HPI     Past Medical History:  Diagnosis Date  . Adrenal abnormality ([place])   . Adrenal insufficiency ([place]) 2013   Tx at [facility]  . Anemia   . Arthritis   . Asthma   . Atrial fibrillation ([place])   . Chronic back pain   . COPD (chronic obstructive pulmonary disease) ([place])   . GERD (gastroesophageal reflux disease)   . Glaucoma   . Headache   . Hyperlipidemia   . Hypertension   . Hypothyroidism   . Lumbar radiculopathy   . Sepsis ([place]) 11/04/2015  . Syncope and collapse 05/11/11  adrenal insuffiency  treated at [facility]  . Thyroid disease   . Vocal cord dysfunction     Patient  Active Problem List   Diagnosis Date Noted  . LLQ pain 11/22/2018  . Constipation 07/23/2017  . History of bariatric surgery 07/23/2017  . Pain of upper abdomen 05/21/2017  . Rectal bleeding 02/04/2017  . Anasarca 11/18/2015  . Generalized abdominal pain   . Protein-calorie malnutrition, severe 11/05/2015  . Sepsis ([place]) 11/04/2015  . AKI (acute kidney injury) ([place])   . Pyrexia   . Acute kidney injury ([place]) 11/02/2015  . Hyponatremia 11/02/2015  . Metabolic acidosis [id number]  . Hypocalcemia 11/01/2015  . C. difficile colitis 10/31/2015  . Pancolitis ([place]) 10/30/2015  . C. difficile diarrhea 10/30/2015  . Abdominal pain in female 10/30/2015  . Hypotension 10/30/2015  . History of atrial fibrillation- NSR now 10/30/2015  . History of adrenal insufficiency 10/30/2015  . History of hypertension 10/30/2015  . Esophageal reflux   . Nausea   . GERD (gastroesophageal reflux disease) 08/28/2015  . Nausea without vomiting 08/28/2015  . Abdominal pain, epigastric 08/28/2015  . Dark stools 08/28/2015  . Atrial fibrillation with RVR ([place]) 05/12/2014  . Hypokalemia 05/12/2014  . Essential hypertension 05/12/2014  . Severe persistent asthma 05/12/2014  . OSA on CPAP 05/12/2014  . Toxic nodular goiter 05/12/2014  . Morbid obesity ([place]) 05/12/2014  . Pain in joint, shoulder region 08/02/2013  .  Muscle weakness (generalized) 08/02/2013  . Decreased range of motion of left shoulder 08/02/2013  . Tight fascia 08/02/2013    Past Surgical History:  Procedure Laterality Date  . ABDOMINAL HYSTERECTOMY    . BACK SURGERY     disc   . CARDIAC CATHETERIZATION  2012 at [facility]  . CHOLECYSTECTOMY    . CHOLECYSTECTOMY, LAPAROSCOPIC    . CLOSED REDUCTION METACARPAL WITH PERCUTANEOUS PINNING Left 03/26/2016   Procedure: CLOSED REDUCTION METACARPAL WITH PERCUTANEOUS PINNING;  Surgeon: Leanora Cover, MD;  Location: [facility];  Service: Orthopedics;  Laterality: Left;  . COLONOSCOPY   2010   [place]: anal papilla, otherwise normal   . COLONOSCOPY WITH PROPOFOL N/A 03/03/2017   sigmoid divertivcula, 4 mm polyp in rectum (tubular adenoma), non-bleeding internal hemorrhoids, surveillance in 7 years  . ESOPHAGOGASTRODUODENOSCOPY N/A 09/17/2015   Surgicall altered stomach. NO specimens collected. normal esophagus  . ESOPHAGOGASTRODUODENOSCOPY (EGD) WITH PROPOFOL N/A 03/03/2017   normal esophagus  . eye surg for glaucoma    . gastic by-pass  2008   Gastric by-pass in 2008 at [facility]  . INCISIONAL HERNIA REPAIR N/A 01/26/2019   Procedure: REPAIR OF SUPRAUMBILICAL INCISIONAL HERNIA WITH MESH;  Surgeon: Erroll Luna, MD;  Location: [facility];  Service: General;  Laterality: N/A;  . right knee surg     arthroscopy  . SHOULDER SURGERY Bilateral      OB History   No obstetric history on file.     Family History  Problem Relation Age of Onset  . Heart attack Mother   . Hypertension Mother   . Heart attack Father   . Asthma Father   . Hyperlipidemia Father   . Hypertension Father   . Heart attack Sister   . Arrhythmia Sister   . Asthma Sister   . Hyperlipidemia Sister   . Hypertension Sister   . Asthma Brother   . Hypertension Brother   . Colon cancer Neg Hx     Social History   Tobacco Use  . Smoking status: Never Smoker  . Smokeless tobacco: Never Used  Substance Use Topics  . Alcohol use: No  . Drug use: No    Home Medications Prior to Admission medications   Medication Sig Start Date End Date Taking? Authorizing Provider  acetaminophen (TYLENOL) 500 MG tablet Take 1,000 mg every 6 (six) hours as needed by mouth (for pain.).    [provider]  albuterol (PROVENTIL HFA;VENTOLIN HFA) 108 (90 BASE) MCG/ACT inhaler Inhale 2 puffs into the lungs every 4 (four) hours as needed for wheezing.     [provider]  amLODipine (NORVASC) 10 MG tablet Take 10 mg by mouth daily. 12/01/18   [provider]  atorvastatin (LIPITOR) 20  MG tablet Take 20 mg by mouth every evening.     [provider]  brimonidine (ALPHAGAN) 0.2 % ophthalmic solution Place 1 drop into both eyes 2 (two) times daily.    [provider]  Calcium Carb-Ergocalciferol 500-200 MG-UNIT TABS Take 1 tablet by mouth daily before breakfast.     [provider]  DULoxetine (CYMBALTA) 60 MG capsule Take 60 mg by mouth daily. 11/07/18   [provider]  flunisolide (NASALIDE) 25 MCG/ACT (0.025%) SOLN Place 2 sprays into the nose daily as needed for allergies. 08/12/18   [provider]  hydrocortisone (CORTEF) 10 MG tablet Take 5-10 mg by mouth See admin instructions. Take 1 tablet (10 mg) by mouth in the morning & take  0.5 tablet (5 mg) by mouth in the afternoon. 12/01/18   [provider]  levocetirizine (XYZAL) 5 MG tablet Take 5 mg every evening by mouth.    [provider]  levothyroxine (SYNTHROID) 50 MCG tablet Take 50 mcg by mouth daily before breakfast. 12/01/18   [provider]  LINZESS 145 MCG CAPS capsule TAKE ONE CAPSULE BY MOUTH DAILY BEFORE BREAKFAST. Patient taking differently: Take 145 mcg by mouth daily as needed (constipation.).  08/17/18   Mahala Menghini, PA-C  Melatonin 5 MG TABS Take 5 mg by mouth at bedtime.    [provider]  metFORMIN (GLUCOPHAGE-XR) 500 MG 24 hr tablet Take 500 mg by mouth 2 (two) times daily. 12/01/18   [provider]  montelukast (SINGULAIR) 10 MG tablet Take 10 mg by mouth at bedtime.    [provider]  omeprazole (PRILOSEC) 20 MG capsule Take 1 capsule (20 mg total) by mouth daily. 30 minutes before breakfast. 04/25/18   Annitta Needs, NP  ondansetron (ZOFRAN-ODT) 4 MG disintegrating tablet TAKE 1 TABLET BY MOUTH EVERY 8 HOURS AS NEEDED. 01/31/19   Annitta Needs, NP  oxyCODONE (OXY IR/ROXICODONE) 5 MG immediate release tablet Take 5 mg by mouth 3 (three) times daily as needed for pain. 10/27/18   [provider]    oxyCODONE (OXY IR/ROXICODONE) 5 MG immediate release tablet Take 1 tablet (5 mg total) by mouth every 6 (six) hours as needed for severe pain. 01/26/19   Cornett, Marcello Moores, MD  potassium chloride SA (KLOR-CON) 20 MEQ tablet Take 20 mEq by mouth daily.    [provider]  pregabalin (LYRICA) 50 MG capsule Take 50 mg by mouth 3 (three) times daily. 11/19/18   [provider]  ranitidine (ZANTAC) 150 MG tablet Take 150 mg by mouth 2 (two) times daily. 06/25/16 10/21/17  [provider]  rivaroxaban (XARELTO) 20 MG TABS tablet Take 20 mg by mouth every evening.  05/10/17   [provider]  SYMBICORT 80-4.5 MCG/ACT inhaler Inhale 2 puffs into the lungs 2 (two) times daily. 08/19/18   [provider]  tizanidine (ZANAFLEX) 2 MG capsule Take 1 capsule (2 mg total) by mouth at bedtime as needed for muscle spasms. 04/17/19   Wurst, [place], PA-C  VOLTAREN 1 % GEL Apply 1 application 4 (four) times daily topically.  06/01/16   [provider]    Allergies    Aspirin, Codeine sulfate, Darvocet [propoxyphene n-acetaminophen], Nsaids, Tramadol, Gabapentin, and Penicillins  Review of Systems   Review of Systems  Constitutional: Negative for activity change, appetite change, chills and fever.  Respiratory: Negative for cough and shortness of breath.   Cardiovascular: Positive for leg swelling (ankle edema). Negative for chest pain and palpitations.  Gastrointestinal: Negative for abdominal pain, diarrhea, nausea and vomiting.  Genitourinary: Negative for dysuria and flank pain.  Musculoskeletal: Positive for back pain (chronic).  Allergic/Immunologic: Positive for immunocompromised state.  Neurological: Positive for dizziness, syncope and light-headedness. Negative for headaches.  Hematological: Bruises/bleeds easily.  All other systems reviewed and are negative.   Physical Exam Updated Vital Signs BP 104/81 (BP Location: Right Arm)   Pulse (!) 56   Temp  98 F (36.7 C)   Resp 16   Ht 5\' 2"  (1.575 m)   Wt 96.6 kg   SpO2 97%   BMI 38.96 kg/m   Physical Exam Vitals and nursing note reviewed.  Constitutional:      General: She is not in acute  distress.    Appearance: She is well-developed. She is obese. She is not ill-appearing.  HENT:     Head: Normocephalic and atraumatic.  Eyes:     General: No scleral icterus.       Right eye: No discharge.        Left eye: No discharge.     Conjunctiva/sclera: Conjunctivae normal.     Pupils: Pupils are equal, round, and reactive to light.  Cardiovascular:     Rate and Rhythm: Normal rate and regular rhythm.  Pulmonary:     Effort: Pulmonary effort is normal. No respiratory distress.     Breath sounds: Normal breath sounds.  Abdominal:     General: There is no distension.     Palpations: Abdomen is soft.     Tenderness: There is no abdominal tenderness.  Musculoskeletal:     Cervical back: Normal range of motion.  Skin:    General: Skin is warm and dry.  Neurological:     Mental Status: She is alert and oriented to person, place, and time.  Psychiatric:        Behavior: Behavior normal.     ED Results / Procedures / Treatments   Labs (all labs ordered are listed, but only abnormal results are displayed) Labs Reviewed  CBC WITH DIFFERENTIAL/PLATELET  COMPREHENSIVE METABOLIC PANEL    EKG None  Radiology No results found.  Procedures Procedures (including critical care time)  Medications Ordered in ED Medications  lactated ringers bolus 1,000 mL (1,000 mLs Intravenous New Bag/Given 07/18/19 1445)    ED Course  I have reviewed the triage vital signs and the nursing notes.  Pertinent labs & imaging results that were available during my care of the patient were reviewed by me and considered in my medical decision making (see chart for details).  59 year old female presents with lightheadedness and 2 syncopal episodes at home yesterday going from sitting to standing.   Blood pressure was noted to be low in the office yesterday and she was advised to come to the ED to get checked out.  She also reports that she is lost about 40 pounds over the past couple of months and is still taking the same dose of her blood pressure medicine which she thinks might be contributing.  EKG was obtained and shows sinus rhythm with low voltage.  On exam she is calm and cooperative.  She has no significant complaints other than dizziness with standing and some chronic low back pain radiating to the right leg.  Heart is regular rate and rhythm.  Lungs are clear to auscultation.  Abdomen is soft and nontender.  She has bilateral ankle edema.  Will obtain basic labs.  Orthostatics are reassuring but she did become symptomatic with standing. Will give 1L bolus  Orthostatic VS for the past 24 hrs (Last 3 readings):  BP- Lying Pulse- Lying BP- Sitting Pulse- Sitting BP- Standing at 0 minutes Pulse- Standing at 0 minutes BP- Standing at 3 minutes Pulse- Standing at 3 minutes  07/18/19 1359 (!) 117/92 57 118/87 59 105/83 59 (!) 120/92 61   At shift change labs are pending. If labs are reassuring and pt can ambulate without difficulty would d/c and hold Amlodipine. She states she can monitor BP at home and she has an appt with her PCP next week. If labs are abnormal or she is not able to ambulate, would consider admission.  Care signed out to Nelda Bucks  MDM Rules/Calculators/A&P  Final Clinical Impression(s) / ED Diagnoses Final diagnoses:  Syncope due to orthostatic hypotension    Rx / DC Orders ED Discharge Orders    None       Recardo Evangelist, PA-C 07/18/19 1519    Recardo Evangelist, PA-C 07/18/19 1519    Lacretia Leigh, MD 07/21/19 [phone]

## 2019-07-29 IMAGING — CT CT ABD-PELV W/ CM
2 of 5 series · 16 of 46 positions shown, 18 images · IV contrast (Isovue)
Comparison: 03/19/2016.

CLINICAL DATA: Postprandial nausea for 1 year.

EXAM:
CT ABDOMEN AND PELVIS WITH CONTRAST
TECHNIQUE: Multidetector CT imaging of the abdomen and pelvis was performed
using the standard protocol following bolus administration of
intravenous contrast.
CONTRAST:  100mL [id number] IOPAMIDOL ([id number]) INJECTION 61%

[Series 2: axial st · axial · 0.67mm/px · z∈[+1018,+1408]mm · 13 of 88 slices shown, 15 images]
[im 5/88  soft-tissue]
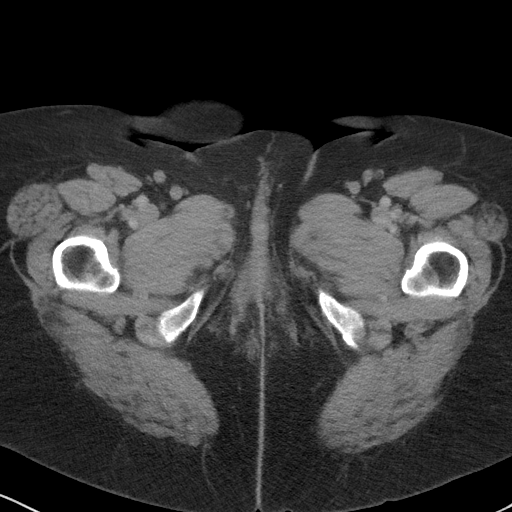
[im 5/88  bone]
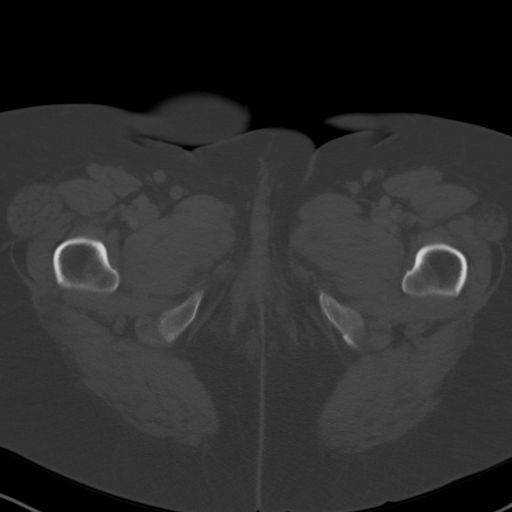
[im 14/88  soft-tissue]
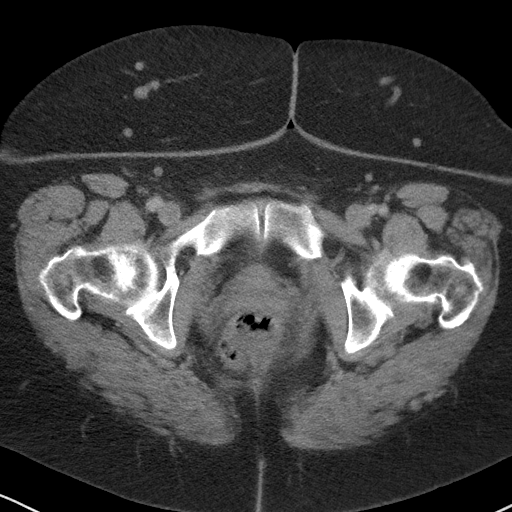
[im 19/88  soft-tissue]
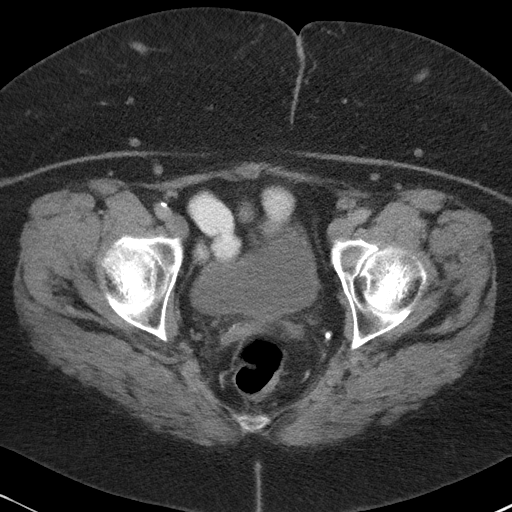
[im 23/88  soft-tissue]
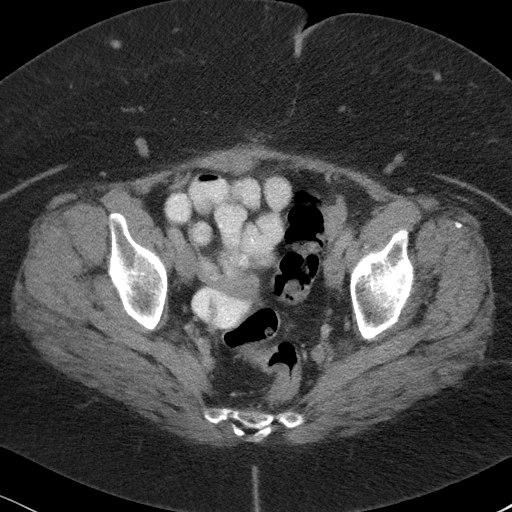
[im 33/88  soft-tissue]
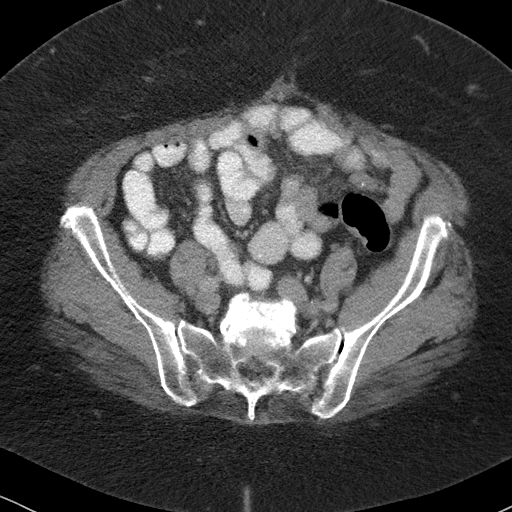
[im 37/88  soft-tissue]
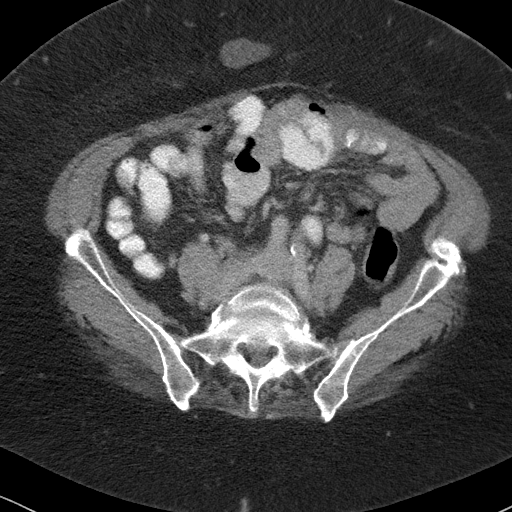
[im 46/88  soft-tissue]
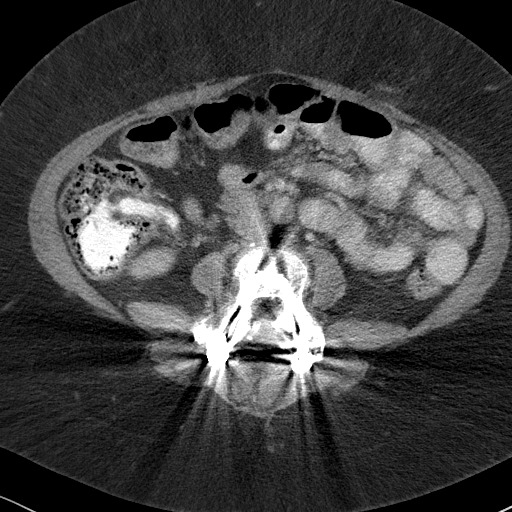
[im 51/88  soft-tissue]
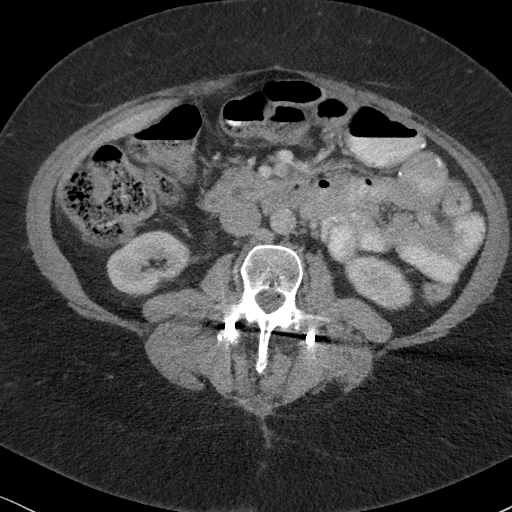
[im 55/88  soft-tissue]
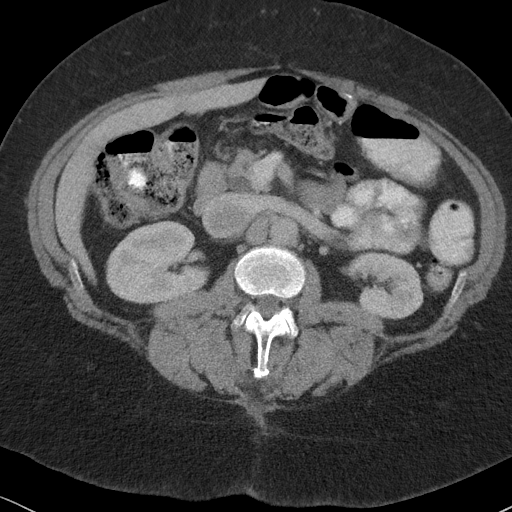
[im 55/88  bone]
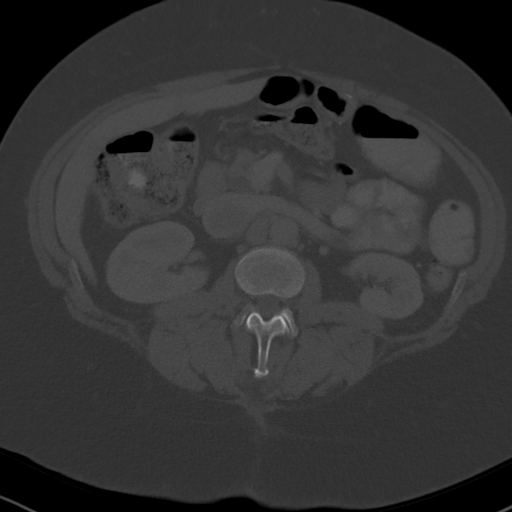
[im 65/88  soft-tissue]
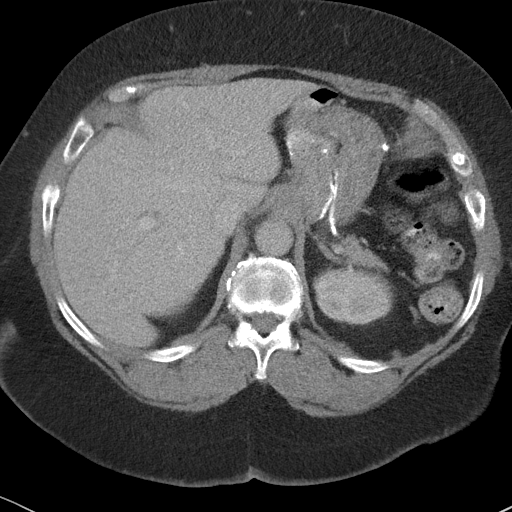
[im 69/88  soft-tissue]
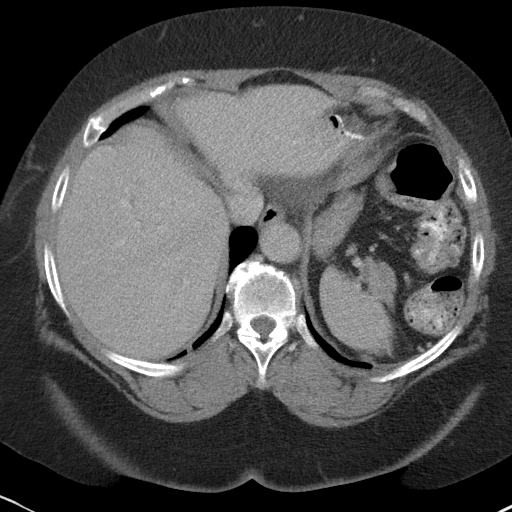
[im 74/88  soft-tissue]
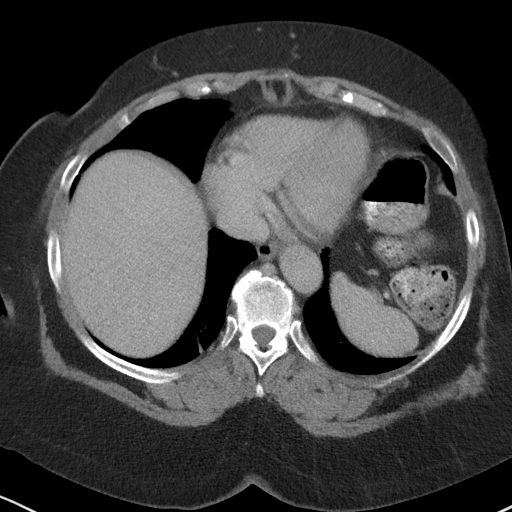
[im 83/88  soft-tissue]
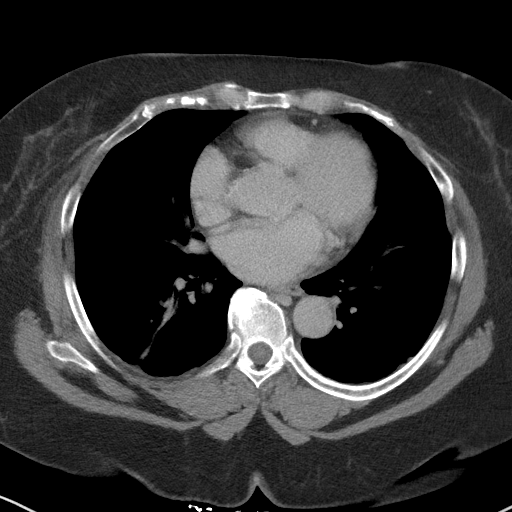

[Series 4: coronal st · coronal · 0.72mm/px · 3 of 108 slices shown]
[im 36/108  soft-tissue]
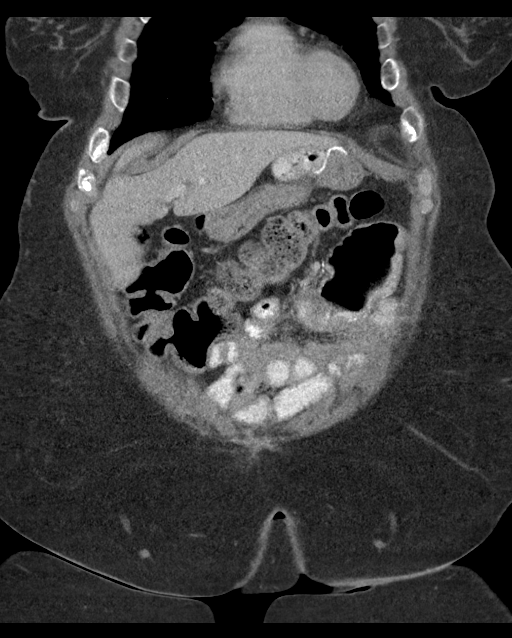
[im 48/108  soft-tissue]
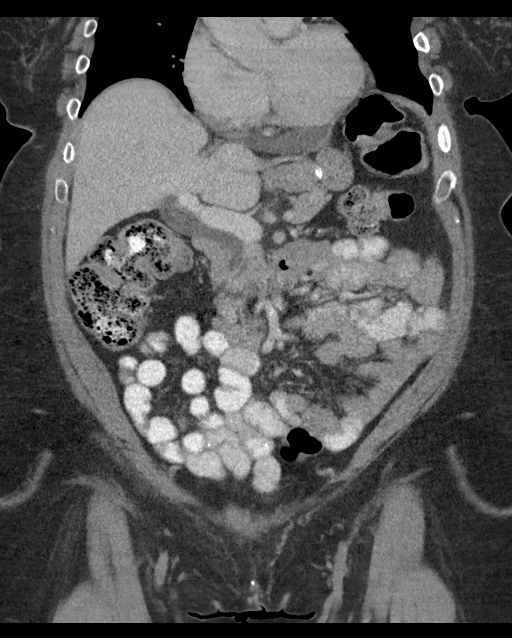
[im 60/108  soft-tissue]
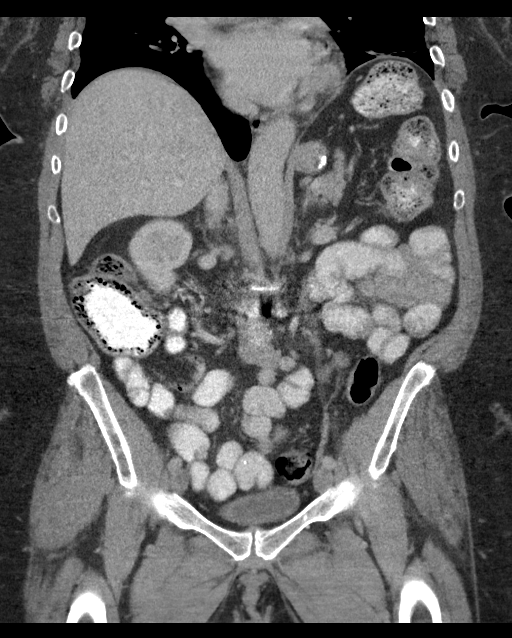

[16 of 46 positions shown; findings below may reference images not displayed]

FINDINGS: Lower chest: Scarring in both lower lobes. Ascending aorta measures
at the upper limits of normal, 3.8 cm. Heart is enlarged. Coronary
artery calcification. No pericardial or pleural effusion.

Hepatobiliary: Liver is unremarkable. Biliary ductal dilatation
after cholecystectomy is unchanged.

Pancreas: Negative.

Spleen: Negative.

Adrenals/Urinary Tract: Adrenal glands and kidneys are unremarkable.
Ureters are decompressed. Bladder is grossly unremarkable. There may
be a urachal remnant calcification along the ventral bladder, as
before.

Stomach/Bowel: Gastric bypass. Stomach, small bowel, appendix and
colon are otherwise unremarkable.

Vascular/Lymphatic: Atherosclerotic calcification of the arterial
vasculature without abdominal aortic aneurysm. No pathologically
enlarged lymph nodes.

Reproductive: Hysterectomy.  No adnexal mass.

Other: No free fluid. Small supraumbilical midline ventral hernia
contains fluid. Mesenteries and peritoneum are otherwise
unremarkable.

Musculoskeletal: Degenerative and postoperative changes in the
spine. No worrisome lytic or sclerotic lesions. L1 compression
deformity is unchanged.
IMPRESSION: 1. No findings to explain the patient's nausea.
2. Aortic atherosclerosis ([id number]-170.0). Coronary artery
calcification. Small supraumbilical midline ventral hernia contains
fluid.

## 2019-08-04 ENCOUNTER — Other Ambulatory Visit: Payer: Self-pay | Admitting: Gastroenterology

## 2019-08-24 ENCOUNTER — Other Ambulatory Visit: Payer: Self-pay | Admitting: Gastroenterology

## 2019-10-05 ENCOUNTER — Encounter: Payer: Self-pay | Admitting: Emergency Medicine

## 2019-10-05 ENCOUNTER — Other Ambulatory Visit: Payer: Self-pay

## 2019-10-05 ENCOUNTER — Ambulatory Visit
Admission: EM | Admit: 2019-10-05 | Discharge: 2019-10-05 | Disposition: A | Discharge status: Home / Self Care | Payer: Medicare Other | Attending: Emergency Medicine | Admitting: Emergency Medicine

## 2019-10-05 DIAGNOSIS — M545 Low back pain, unspecified: Secondary | ICD-10-CM

## 2019-10-05 DIAGNOSIS — G8929 Other chronic pain: Secondary | ICD-10-CM

## 2019-10-05 MED ORDER — CYCLOBENZAPRINE HCL 5 MG PO TABS: 5.0000 mg | ORAL_TABLET | Freq: Two times a day (BID) | ORAL | 0 refills | Status: DC | PRN

## 2019-10-05 MED ORDER — PREDNISONE 10 MG PO TABS: 20.0000 mg | ORAL_TABLET | Freq: Every day | ORAL | 0 refills | Status: DC

## 2019-10-05 MED ORDER — METHYLPREDNISOLONE SODIUM SUCC 125 MG IJ SOLR
80.0000 mg | Freq: Once | INTRAMUSCULAR | Status: AC
  Administered 2019-10-05: 80 mg via INTRAMUSCULAR

## 2019-10-05 NOTE — ED Triage Notes (Addendum)
Chronic low back pain that has gotten worse over the past few days.  Pt has an appointment to see a surgeon about this problem next Tuesday.  Took pain medication this morning at 0600 with no relief.

## 2019-10-05 NOTE — ED Provider Notes (Addendum)
[place]   [id number] 10/05/19 Arrival Time: 68   Chief Complaint  Patient presents with  . Back Pain    SUBJECTIVE: History from: patient.  Deborah Murray is a 59 y.o. female Complains of chronic back pain for the past few days.  Denies any precipitating event.  He localizes the pain to the left  low back.  She describes the pain as constant and achy.  She has tried OTC medications without relief.  His symptoms are made worse with ROM.  She reports similar symptoms in the past.  Denies chills, fever, nausea, vomiting, diarrhea, paresthesia, confusion, blurry vision, neuropathy.   ROS: As per HPI.  All other pertinent ROS negative.     Past Medical History:  Diagnosis Date  . Adrenal abnormality ([place])   . Adrenal insufficiency ([place]) 2013   Tx at [facility]  . Anemia   . Arthritis   . Asthma   . Atrial fibrillation ([place])   . Chronic back pain   . COPD (chronic obstructive pulmonary disease) ([place])   . GERD (gastroesophageal reflux disease)   . Glaucoma   . Headache   . Hyperlipidemia   . Hypertension   . Hypothyroidism   . Lumbar radiculopathy   . Sepsis ([place]) 11/04/2015  . Syncope and collapse 05/11/11  adrenal insuffiency  treated at [facility]  . Thyroid disease   . Vocal cord dysfunction    Past Surgical History:  Procedure Laterality Date  . ABDOMINAL HYSTERECTOMY    . BACK SURGERY     disc   . CARDIAC CATHETERIZATION  2012 at [facility]  . CHOLECYSTECTOMY    . CHOLECYSTECTOMY, LAPAROSCOPIC    . CLOSED REDUCTION METACARPAL WITH PERCUTANEOUS PINNING Left 03/26/2016   Procedure: CLOSED REDUCTION METACARPAL WITH PERCUTANEOUS PINNING;  Surgeon: Leanora Cover, MD;  Location: [place];  Service: Orthopedics;  Laterality: Left;  . COLONOSCOPY  2010   [place]: anal papilla, otherwise normal   . COLONOSCOPY WITH PROPOFOL N/A 03/03/2017   sigmoid divertivcula, 4 mm polyp in rectum (tubular adenoma), non-bleeding internal hemorrhoids, surveillance  in 7 years  . ESOPHAGOGASTRODUODENOSCOPY N/A 09/17/2015   Surgicall altered stomach. NO specimens collected. normal esophagus  . ESOPHAGOGASTRODUODENOSCOPY (EGD) WITH PROPOFOL N/A 03/03/2017   normal esophagus  . eye surg for glaucoma    . gastic by-pass  2008   Gastric by-pass in 2008 at [facility]  . INCISIONAL HERNIA REPAIR N/A 01/26/2019   Procedure: REPAIR OF SUPRAUMBILICAL INCISIONAL HERNIA WITH MESH;  Surgeon: Erroll Luna, MD;  Location: [place];  Service: General;  Laterality: N/A;  . right knee surg     arthroscopy  . SHOULDER SURGERY Bilateral    Allergies  Allergen Reactions  . Aspirin Shortness Of Breath and Palpitations  . Codeine Sulfate Shortness Of Breath and Palpitations  . Darvocet [Propoxyphene N-Acetaminophen] Shortness Of Breath and Palpitations  . Nsaids     Exacerbation of asthma  . Tramadol Shortness Of Breath  . Gabapentin Other (See Comments)    dizziness  . Penicillins Other (See Comments)    Has patient had a PCN reaction causing immediate rash, facial/tongue/throat swelling, SOB or lightheadedness with hypotension: No Has patient had a PCN reaction causing severe rash involving mucus membranes or skin necrosis: No Has patient had a PCN reaction that required hospitalization No Has patient had a PCN reaction occurring within the last 10 years: Yes If all of the above answers are "NO", then may proceed with Cephalosporin use.  No current facility-administered medications on file prior to encounter.   Current Outpatient Medications on File Prior to Encounter  Medication Sig Dispense Refill  . acetaminophen (TYLENOL) 500 MG tablet Take 1,000 mg every 6 (six) hours as needed by mouth (for pain.).    Marland Kitchen albuterol (PROVENTIL HFA;VENTOLIN HFA) 108 (90 BASE) MCG/ACT inhaler Inhale 2 puffs into the lungs every 4 (four) hours as needed for wheezing.     Marland Kitchen amLODipine (NORVASC) 10 MG tablet Take 10 mg by mouth daily.    Marland Kitchen atorvastatin (LIPITOR) 20 MG tablet  Take 20 mg by mouth every evening.     . baclofen (LIORESAL) 10 MG tablet Take 5 mg by mouth at bedtime as needed for muscle spasms.    . brimonidine (ALPHAGAN) 0.2 % ophthalmic solution Place 1 drop into both eyes 2 (two) times daily.    . Calcium Carb-Ergocalciferol 500-200 MG-UNIT TABS Take 1 tablet by mouth daily before breakfast.     . DULoxetine (CYMBALTA) 60 MG capsule Take 60 mg by mouth daily.    . flunisolide (NASALIDE) 25 MCG/ACT (0.025%) SOLN Place 2 sprays into the nose daily as needed for allergies.    . hydrocortisone (CORTEF) 10 MG tablet Take 5-10 mg by mouth See admin instructions. Take 1 tablet (10 mg) by mouth in the morning & take 0.5 tablet (5 mg) by mouth in the afternoon.    Marland Kitchen levocetirizine (XYZAL) 5 MG tablet Take 5 mg every evening by mouth.    . levothyroxine (SYNTHROID) 50 MCG tablet Take 50 mcg by mouth daily before breakfast.    . LINZESS 145 MCG CAPS capsule TAKE ONE CAPSULE BY MOUTH DAILY BEFORE BREAKFAST. 30 capsule 11  . Melatonin 5 MG TABS Take 5 mg by mouth at bedtime.    . metFORMIN (GLUCOPHAGE-XR) 500 MG 24 hr tablet Take 500 mg by mouth 2 (two) times daily.    . montelukast (SINGULAIR) 10 MG tablet Take 10 mg by mouth at bedtime.    Marland Kitchen omeprazole (PRILOSEC) 20 MG capsule Take 1 capsule (20 mg total) by mouth daily. 30 minutes before breakfast. 90 capsule 3  . ondansetron (ZOFRAN-ODT) 4 MG disintegrating tablet TAKE 1 TABLET BY MOUTH EVERY 8 HOURS AS NEEDED. 60 tablet 3  . oxyCODONE (OXY IR/ROXICODONE) 5 MG immediate release tablet Take 5 mg by mouth 3 (three) times daily as needed for pain.    Marland Kitchen oxyCODONE (OXY IR/ROXICODONE) 5 MG immediate release tablet Take 1 tablet (5 mg total) by mouth every 6 (six) hours as needed for severe pain. 15 tablet 0  . potassium chloride SA (KLOR-CON) 20 MEQ tablet Take 20 mEq by mouth daily.    . pregabalin (LYRICA) 50 MG capsule Take 50 mg by mouth 3 (three) times daily.    . rivaroxaban (XARELTO) 20 MG TABS tablet Take 20 mg  by mouth every evening.     . SYMBICORT 80-4.5 MCG/ACT inhaler Inhale 2 puffs into the lungs 2 (two) times daily.    . tizanidine (ZANAFLEX) 2 MG capsule Take 1 capsule (2 mg total) by mouth at bedtime as needed for muscle spasms. 15 capsule 0  . VOLTAREN 1 % GEL Apply 1 application 4 (four) times daily topically.     Marland Kitchen XTAMPZA ER 13.5 MG C12A Take 1 capsule by mouth daily.     Social History   Socioeconomic History  . Marital status: Single    Spouse name: Not on file  . Number of children: Not on file  .  Years of education: Not on file  . Highest education level: Not on file  Occupational History  . Occupation: disability   Tobacco Use  . Smoking status: Never Smoker  . Smokeless tobacco: Never Used  Vaping Use  . Vaping Use: Never used  Substance and Sexual Activity  . Alcohol use: No  . Drug use: No  . Sexual activity: Never  Other Topics Concern  . Not on file  Social History Narrative  . Not on file   Social Determinants of Health   Financial Resource Strain:   . Difficulty of Paying Living Expenses:   Food Insecurity:   . Worried About Charity fundraiser in the Last Year:   . Arboriculturist in the Last Year:   Transportation Needs:   . Film/video editor (Medical):   Marland Kitchen Lack of Transportation (Non-Medical):   Physical Activity:   . Days of Exercise per Week:   . Minutes of Exercise per Session:   Stress:   . Feeling of Stress :   Social Connections:   . Frequency of Communication with Friends and Family:   . Frequency of Social Gatherings with Friends and Family:   . Attends Religious Services:   . Active Member of Clubs or Organizations:   . Attends Archivist Meetings:   Marland Kitchen Marital Status:   Intimate Partner Violence:   . Fear of Current or Ex-Partner:   . Emotionally Abused:   Marland Kitchen Physically Abused:   . Sexually Abused:    Family History  Problem Relation Age of Onset  . Heart attack Mother   . Hypertension Mother   . Heart attack  Father   . Asthma Father   . Hyperlipidemia Father   . Hypertension Father   . Heart attack Sister   . Arrhythmia Sister   . Asthma Sister   . Hyperlipidemia Sister   . Hypertension Sister   . Asthma Brother   . Hypertension Brother   . Colon cancer Neg Hx     OBJECTIVE:  Vitals:   10/05/19 1053 10/05/19 1054  BP: (!) 157/105   Pulse: 71   Resp: 18   Temp: 98 F (36.7 C)   TempSrc: Oral   SpO2: 95%   Weight:  200 lb (90.7 kg)  Height:  5\' 2"  (1.575 m)     Physical Exam Vitals and nursing note reviewed.  Constitutional:      General: She is not in acute distress.    Appearance: Normal appearance. She is normal weight. She is not ill-appearing, toxic-appearing or diaphoretic.  Cardiovascular:     Rate and Rhythm: Normal rate and regular rhythm.     Pulses: Normal pulses.     Heart sounds: Normal heart sounds. No murmur heard.  No gallop.   Pulmonary:     Effort: Pulmonary effort is normal. No respiratory distress.     Breath sounds: Normal breath sounds. No stridor. No wheezing, rhonchi or rales.  Chest:     Chest wall: No tenderness.  Musculoskeletal:        General: Tenderness present.     Lumbar back: Spasms and tenderness present. No swelling, edema, deformity or signs of trauma.     Comments: Back:  Patient ambulates from chair to exam table without difficulty.  Inspection: Skin clear and intact without obvious swelling, erythema, or ecchymosis. Warm to the touch  Palpation: Vertebral processes nontender. Tenderness about the left lower back  DTR: Patellar tendon reflex intact  Special Tests: Negative Straight leg raise  Neurological:     Mental Status: She is alert.    LABS:  No results found for this or any previous visit (from the past 24 hour(s)).   ASSESSMENT & PLAN:  1. Chronic left-sided low back pain without sciatica     Meds ordered this encounter  Medications  . methylPREDNISolone sodium succinate (SOLU-MEDROL) 125 mg/2 mL injection 80  mg  . predniSONE (DELTASONE) 10 MG tablet    Sig: Take 2 tablets (20 mg total) by mouth daily.    Dispense:  15 tablet    Refill:  0  . cyclobenzaprine (FLEXERIL) 5 MG tablet    Sig: Take 1 tablet (5 mg total) by mouth 2 (two) times daily as needed for muscle spasms.    Dispense:  30 tablet    Refill:  0   Patient is stable at discharge.  She has a chronic back pain and she will be treated with Solu-Medrol IM in office.  Low-dose prednisone and Flexeril were prescribed.  She was advised to follow-up with PCP.   Discharge instructions Rest, ice and heat as needed Ensure adequate ROM as tolerated. Continue to use Tylenol as needed for pain  Prescribed low-dose prednisone for inflammation Prescribed flexeril  for muscle spasm.  Do not drive or operate heavy machinery while taking this medication Return here or go to ER if you have any new or worsening symptoms such as numbness/tingling of the inner thighs, loss of bladder or bowel control, headache/blurry vision, nausea/vomiting, confusion/altered mental status, dizziness, weakness, passing out, imbalance, etc...    Reviewed expectations re: course of current medical issues. Questions answered. Outlined signs and symptoms indicating need for more acute intervention. Patient verbalized understanding. After Visit Summary given.      Note: This document was prepared using Dragon voice recognition software and may include unintentional dictation errors.    Emerson Monte, FNP 10/05/19 1122    Emerson Monte, FNP 10/05/19 1124

## 2019-10-05 NOTE — Discharge Instructions (Addendum)
Rest, ice and heat as needed Ensure adequate ROM as tolerated. Continue to use Tylenol as needed for pain  Prescribed low-dose prednisone for inflammation Prescribed flexeril  for muscle spasm.  Do not drive or operate heavy machinery while taking this medication Return here or go to ER if you have any new or worsening symptoms such as numbness/tingling of the inner thighs, loss of bladder or bowel control, headache/blurry vision, nausea/vomiting, confusion/altered mental status, dizziness, weakness, passing out, imbalance, etc..Marland Kitchen

## 2019-10-21 ENCOUNTER — Encounter: Payer: Self-pay | Admitting: Emergency Medicine

## 2019-10-21 ENCOUNTER — Ambulatory Visit
Admission: EM | Admit: 2019-10-21 | Discharge: 2019-10-21 | Disposition: A | Discharge status: Home / Self Care | Payer: Medicare Other

## 2019-10-21 ENCOUNTER — Other Ambulatory Visit: Payer: Self-pay

## 2019-10-21 DIAGNOSIS — M545 Low back pain, unspecified: Secondary | ICD-10-CM

## 2019-10-21 MED ORDER — DEXAMETHASONE SODIUM PHOSPHATE 10 MG/ML IJ SOLN: 10.0000 mg | Freq: Once | INTRAMUSCULAR | Status: DC

## 2019-10-21 MED ORDER — PREDNISONE 20 MG PO TABS: 20.0000 mg | ORAL_TABLET | Freq: Two times a day (BID) | ORAL | 0 refills | Status: AC

## 2019-10-21 MED ORDER — TIZANIDINE HCL 2 MG PO CAPS: 2.0000 mg | ORAL_CAPSULE | Freq: Every day | ORAL | 0 refills | Status: DC

## 2019-10-21 NOTE — Discharge Instructions (Addendum)
Steroid shot given in office Continue conservative management of rest, ice, and gentle stretches Prednisone prescribed.  Take as directed and to completion Take zanaflex at nighttime for symptomatic relief. Avoid driving or operating heavy machinery while using medication. Follow up with PCP if symptoms persist Return or go to the ER if you have any new or worsening symptoms (fever, chills, chest pain, abdominal pain, changes in bowel or bladder habits, pain radiating into lower legs, etc...)  

## 2019-10-21 NOTE — ED Provider Notes (Signed)
[place]   [id number] 10/21/19 Arrival Time: 16  CC: Back PAIN  SUBJECTIVE: History from: patient. Deborah Murray is a 59 y.o. female complains of acute on chronic low back pain x 2 days.  Denies a precipitating event or specific injury.  Reports hx of herniated disc, surgery scheduled on 8/9.  Localizes the pain to the low back.  Describes the pain as constant and dull in character.  Has tried OTC medications without relief.  Symptoms are made worse with movement.  Reports similar symptoms in the past that improved with steroid.  Denies fever, chills, erythema, ecchymosis, effusion, weakness, numbness and tingling, saddle paresthesias, loss of bowel or bladder function.      ROS: As per HPI.  All other pertinent ROS negative.     Past Medical History:  Diagnosis Date  . Adrenal abnormality ([place])   . Adrenal insufficiency ([place]) 2013   Tx at [facility]  . Anemia   . Arthritis   . Asthma   . Atrial fibrillation ([place])   . Chronic back pain   . COPD (chronic obstructive pulmonary disease) ([place])   . GERD (gastroesophageal reflux disease)   . Glaucoma   . Headache   . Hyperlipidemia   . Hypertension   . Hypothyroidism   . Lumbar radiculopathy   . Sepsis ([place]) 11/04/2015  . Syncope and collapse 05/11/11  adrenal insuffiency  treated at [facility]  . Thyroid disease   . Vocal cord dysfunction    Past Surgical History:  Procedure Laterality Date  . ABDOMINAL HYSTERECTOMY    . BACK SURGERY     disc   . CARDIAC CATHETERIZATION  2012 at [facility]  . CHOLECYSTECTOMY    . CHOLECYSTECTOMY, LAPAROSCOPIC    . CLOSED REDUCTION METACARPAL WITH PERCUTANEOUS PINNING Left 03/26/2016   Procedure: CLOSED REDUCTION METACARPAL WITH PERCUTANEOUS PINNING;  Surgeon: Leanora Cover, MD;  Location: [place];  Service: Orthopedics;  Laterality: Left;  . COLONOSCOPY  2010   [place]: anal papilla, otherwise normal   . COLONOSCOPY WITH PROPOFOL N/A 03/03/2017   sigmoid divertivcula,  4 mm polyp in rectum (tubular adenoma), non-bleeding internal hemorrhoids, surveillance in 7 years  . ESOPHAGOGASTRODUODENOSCOPY N/A 09/17/2015   Surgicall altered stomach. NO specimens collected. normal esophagus  . ESOPHAGOGASTRODUODENOSCOPY (EGD) WITH PROPOFOL N/A 03/03/2017   normal esophagus  . eye surg for glaucoma    . gastic by-pass  2008   Gastric by-pass in 2008 at [facility]  . INCISIONAL HERNIA REPAIR N/A 01/26/2019   Procedure: REPAIR OF SUPRAUMBILICAL INCISIONAL HERNIA WITH MESH;  Surgeon: Erroll Luna, MD;  Location: [place];  Service: General;  Laterality: N/A;  . right knee surg     arthroscopy  . SHOULDER SURGERY Bilateral    Allergies  Allergen Reactions  . Aspirin Shortness Of Breath and Palpitations  . Codeine Sulfate Shortness Of Breath and Palpitations  . Darvocet [Propoxyphene N-Acetaminophen] Shortness Of Breath and Palpitations  . Nsaids     Exacerbation of asthma  . Tramadol Shortness Of Breath  . Gabapentin Other (See Comments)    dizziness  . Penicillins Other (See Comments)    Has patient had a PCN reaction causing immediate rash, facial/tongue/throat swelling, SOB or lightheadedness with hypotension: No Has patient had a PCN reaction causing severe rash involving mucus membranes or skin necrosis: No Has patient had a PCN reaction that required hospitalization No Has patient had a PCN reaction occurring within the last 10 years: Yes If all of  the above answers are "NO", then may proceed with Cephalosporin use.    No current facility-administered medications on file prior to encounter.   Current Outpatient Medications on File Prior to Encounter  Medication Sig Dispense Refill  . oxyCODONE ER 13.5 MG C12A Take by mouth.    Marland Kitchen acetaminophen (TYLENOL) 500 MG tablet Take 1,000 mg every 6 (six) hours as needed by mouth (for pain.).    Marland Kitchen albuterol (PROVENTIL HFA;VENTOLIN HFA) 108 (90 BASE) MCG/ACT inhaler Inhale 2 puffs into the lungs every 4 (four) hours  as needed for wheezing.     Marland Kitchen amLODipine (NORVASC) 10 MG tablet Take 10 mg by mouth daily.    Marland Kitchen atorvastatin (LIPITOR) 20 MG tablet Take 20 mg by mouth every evening.     . baclofen (LIORESAL) 10 MG tablet Take 5 mg by mouth at bedtime as needed for muscle spasms.    . brimonidine (ALPHAGAN) 0.2 % ophthalmic solution Place 1 drop into both eyes 2 (two) times daily.    . Calcium Carb-Ergocalciferol 500-200 MG-UNIT TABS Take 1 tablet by mouth daily before breakfast.     . cyclobenzaprine (FLEXERIL) 5 MG tablet Take 1 tablet (5 mg total) by mouth 2 (two) times daily as needed for muscle spasms. 30 tablet 0  . DULoxetine (CYMBALTA) 60 MG capsule Take 60 mg by mouth daily.    . flunisolide (NASALIDE) 25 MCG/ACT (0.025%) SOLN Place 2 sprays into the nose daily as needed for allergies.    . hydrocortisone (CORTEF) 10 MG tablet Take 5-10 mg by mouth See admin instructions. Take 1 tablet (10 mg) by mouth in the morning & take 0.5 tablet (5 mg) by mouth in the afternoon.    Marland Kitchen levocetirizine (XYZAL) 5 MG tablet Take 5 mg every evening by mouth.    . levothyroxine (SYNTHROID) 50 MCG tablet Take 50 mcg by mouth daily before breakfast.    . LINZESS 145 MCG CAPS capsule TAKE ONE CAPSULE BY MOUTH DAILY BEFORE BREAKFAST. 30 capsule 11  . Melatonin 5 MG TABS Take 5 mg by mouth at bedtime.    . metFORMIN (GLUCOPHAGE-XR) 500 MG 24 hr tablet Take 500 mg by mouth 2 (two) times daily.    . montelukast (SINGULAIR) 10 MG tablet Take 10 mg by mouth at bedtime.    Marland Kitchen NARCAN 4 MG/0.1ML LIQD nasal spray kit SMARTSIG:Inhalation Both Nares    . omeprazole (PRILOSEC) 20 MG capsule Take 1 capsule (20 mg total) by mouth daily. 30 minutes before breakfast. 90 capsule 3  . ondansetron (ZOFRAN-ODT) 4 MG disintegrating tablet TAKE 1 TABLET BY MOUTH EVERY 8 HOURS AS NEEDED. 60 tablet 3  . Oxycodone HCl 10 MG TABS Take 10 mg by mouth every 6 (six) hours as needed.    . potassium chloride SA (KLOR-CON) 20 MEQ tablet Take 20 mEq by mouth  daily.    . predniSONE (DELTASONE) 10 MG tablet Take 2 tablets (20 mg total) by mouth daily. 15 tablet 0  . pregabalin (LYRICA) 50 MG capsule Take 50 mg by mouth 3 (three) times daily.    . rivaroxaban (XARELTO) 20 MG TABS tablet Take 20 mg by mouth every evening.     . SYMBICORT 80-4.5 MCG/ACT inhaler Inhale 2 puffs into the lungs 2 (two) times daily.    . tizanidine (ZANAFLEX) 2 MG capsule Take 1 capsule (2 mg total) by mouth at bedtime as needed for muscle spasms. 15 capsule 0  . VOLTAREN 1 % GEL Apply 1 application 4 (four)  times daily topically.     Marland Kitchen XTAMPZA ER 13.5 MG C12A Take 1 capsule by mouth daily.     Social History   Socioeconomic History  . Marital status: Single    Spouse name: Not on file  . Number of children: Not on file  . Years of education: Not on file  . Highest education level: Not on file  Occupational History  . Occupation: disability   Tobacco Use  . Smoking status: Never Smoker  . Smokeless tobacco: Never Used  Vaping Use  . Vaping Use: Never used  Substance and Sexual Activity  . Alcohol use: No  . Drug use: No  . Sexual activity: Never  Other Topics Concern  . Not on file  Social History Narrative  . Not on file   Social Determinants of Health   Financial Resource Strain:   . Difficulty of Paying Living Expenses:   Food Insecurity:   . Worried About Charity fundraiser in the Last Year:   . Arboriculturist in the Last Year:   Transportation Needs:   . Film/video editor (Medical):   Marland Kitchen Lack of Transportation (Non-Medical):   Physical Activity:   . Days of Exercise per Week:   . Minutes of Exercise per Session:   Stress:   . Feeling of Stress :   Social Connections:   . Frequency of Communication with Friends and Family:   . Frequency of Social Gatherings with Friends and Family:   . Attends Religious Services:   . Active Member of Clubs or Organizations:   . Attends Archivist Meetings:   Marland Kitchen Marital Status:   Intimate  Partner Violence:   . Fear of Current or Ex-Partner:   . Emotionally Abused:   Marland Kitchen Physically Abused:   . Sexually Abused:    Family History  Problem Relation Age of Onset  . Heart attack Mother   . Hypertension Mother   . Heart attack Father   . Asthma Father   . Hyperlipidemia Father   . Hypertension Father   . Heart attack Sister   . Arrhythmia Sister   . Asthma Sister   . Hyperlipidemia Sister   . Hypertension Sister   . Asthma Brother   . Hypertension Brother   . Colon cancer Neg Hx     OBJECTIVE:  Vitals:   10/21/19 1414  BP: (!) 133/96  Pulse: 97  Resp: 16  Temp: 98.1 F (36.7 C)  TempSrc: Oral  SpO2: 97%    General appearance: ALERT; in no acute distress.  Head: NCAT Lungs: Normal respiratory effort; CTAB CV: RRR Musculoskeletal: Back Inspection: Skin warm, dry, clear and intact without obvious erythema, effusion, or ecchymosis.  Palpation: diffusely TTP over bilateral low back ROM: FROM active and passive Strength: 5/5 shld abduction, 5/5 shld adduction, 5/5 elbow flexion, 5/5 elbow extension, 5/5 grip strength, 5/5 hip flexion, 5/5 hip extension Skin: warm and dry Neurologic: Ambulates with a cane; Sensation intact about the upper/ lower extremities Psychological: alert and cooperative; normal mood and affect  ASSESSMENT & PLAN:  1. Acute bilateral low back pain without sciatica     Meds ordered this encounter  Medications  . predniSONE (DELTASONE) 20 MG tablet    Sig: Take 1 tablet (20 mg total) by mouth 2 (two) times daily with a meal for 5 days.    Dispense:  10 tablet    Refill:  0    Order Specific Question:   Supervising Provider  AnswerRaylene Everts [[id number]]  . tizanidine (ZANAFLEX) 2 MG capsule    Sig: Take 1 capsule (2 mg total) by mouth at bedtime.    Dispense:  15 capsule    Refill:  0    Order Specific Question:   Supervising Provider    Answer:   Raylene Everts [[id number]]  . dexamethasone (DECADRON) injection 10  mg   Steroid shot given in office Continue conservative management of rest, ice, and gentle stretches Prednisone prescribed.  Take as directed and to completion Take zanaflex at nighttime for symptomatic relief. Avoid driving or operating heavy machinery while using medication. Follow up with PCP if symptoms persist Return or go to the ER if you have any new or worsening symptoms (fever, chills, chest pain, abdominal pain, changes in bowel or bladder habits, pain radiating into lower legs, etc...)   Reviewed expectations re: course of current medical issues. Questions answered. Outlined signs and symptoms indicating need for more acute intervention. Patient verbalized understanding. After Visit Summary given.    Lestine Box, PA-C 10/21/19 1447

## 2019-10-21 NOTE — ED Triage Notes (Signed)
Chronic lower back pain where bulging discs are, surgery planned for next month.

## 2019-11-22 ENCOUNTER — Ambulatory Visit: Payer: Medicare Other | Admitting: Gastroenterology

## 2019-11-22 ENCOUNTER — Encounter: Payer: Self-pay | Admitting: Internal Medicine

## 2020-03-03 ENCOUNTER — Encounter (HOSPITAL_COMMUNITY): Payer: Self-pay | Admitting: Emergency Medicine

## 2020-03-03 ENCOUNTER — Emergency Department (HOSPITAL_COMMUNITY)
Admission: EM | Admit: 2020-03-03 | Discharge: 2020-03-03 | Disposition: A | Discharge status: Home / Self Care | Payer: Medicare Other | Attending: Emergency Medicine | Admitting: Emergency Medicine

## 2020-03-03 DIAGNOSIS — I10 Essential (primary) hypertension: Secondary | ICD-10-CM | POA: Insufficient documentation

## 2020-03-03 DIAGNOSIS — M5431 Sciatica, right side: Secondary | ICD-10-CM

## 2020-03-03 DIAGNOSIS — Z79899 Other long term (current) drug therapy: Secondary | ICD-10-CM | POA: Diagnosis not present

## 2020-03-03 DIAGNOSIS — M549 Dorsalgia, unspecified: Secondary | ICD-10-CM | POA: Diagnosis present

## 2020-03-03 DIAGNOSIS — M5441 Lumbago with sciatica, right side: Secondary | ICD-10-CM | POA: Insufficient documentation

## 2020-03-03 DIAGNOSIS — E039 Hypothyroidism, unspecified: Secondary | ICD-10-CM | POA: Insufficient documentation

## 2020-03-03 DIAGNOSIS — J449 Chronic obstructive pulmonary disease, unspecified: Secondary | ICD-10-CM | POA: Insufficient documentation

## 2020-03-03 MED ORDER — PREDNISONE 20 MG PO TABS
60.0000 mg | ORAL_TABLET | Freq: Once | ORAL | Status: AC
  Administered 2020-03-03: 60 mg via ORAL
  Filled 2020-03-03: qty 3

## 2020-03-03 MED ORDER — PREDNISONE 10 MG (21) PO TBPK: ORAL_TABLET | ORAL | 0 refills | Status: AC

## 2020-03-03 MED ORDER — HYDROMORPHONE HCL 1 MG/ML IJ SOLN
1.0000 mg | Freq: Once | INTRAMUSCULAR | Status: AC
  Administered 2020-03-03: 1 mg via INTRAVENOUS

## 2020-03-03 MED ORDER — HYDROMORPHONE HCL 1 MG/ML IJ SOLN
1.0000 mg | Freq: Once | INTRAMUSCULAR | Status: DC
  Filled 2020-03-03: qty 1

## 2020-03-03 NOTE — ED Triage Notes (Signed)
Pt arrives to ED pov with a chief complaint of right leg pain.  This is a chronic pain that she says was a result of a disc issue in her middle back, she had surgery in august 2021 in [place]. This helped at first but has gradually came back. She has had increased right leg throbbing pain for 2 weeks with some weakness. She was seen at [facility] ED this past Tuesday had xrays and placed on prednisone taper which did help but as soon as she finished that the pain returned.

## 2020-03-03 NOTE — ED Provider Notes (Signed)
[facility] EMERGENCY DEPARTMENT Provider Note   CSN: [id number] Arrival date & time: 03/03/20  1656     History Chief Complaint  Patient presents with  . Leg Pain    Deborah Murray is a 59 y.o. female.  HPI   Patient presents to the ED for evaluation of persistent pain in her back going down her right leg.  Patient has a history of prior back problems including prior disc disease and surgery.  Patient states she is had pain in her back and leg similar to her prior back problems for the past week or so.  Pain increases with movement and standing.  Pain is sharp and goes down her leg.  She denies having any trouble with numbness or weakness.  She went to [facility] ER and was given pain medications as well as a prescription for steroids.  She had plain film x-rays that were unremarkable.  Patient states she felt better after the steroids but she has finished those and the pain is persisting.  Patient contacted her spine surgeon but has not had an MRI yet.  Patient came to the ED because of persistent symptoms Past Medical History:  Diagnosis Date  . Adrenal abnormality ([place])   . Adrenal insufficiency ([place]) 2013   Tx at [facility]  . Anemia   . Arthritis   . Asthma   . Atrial fibrillation ([place])   . Chronic back pain   . COPD (chronic obstructive pulmonary disease) ([place])   . GERD (gastroesophageal reflux disease)   . Glaucoma   . Headache   . Hyperlipidemia   . Hypertension   . Hypothyroidism   . Lumbar radiculopathy   . Sepsis ([place]) 11/04/2015  . Syncope and collapse 05/11/11  adrenal insuffiency  treated [facility]  . Thyroid disease   . Vocal cord dysfunction     Patient Active Problem List   Diagnosis Date Noted  . LLQ pain 11/22/2018  . Constipation 07/23/2017  . History of bariatric surgery 07/23/2017  . Pain of upper abdomen 05/21/2017  . Rectal bleeding 02/04/2017  . Anasarca 11/18/2015  . Generalized abdominal pain   . Protein-calorie  malnutrition, severe 11/05/2015  . Sepsis ([place]) 11/04/2015  . AKI (acute kidney injury) ([place])   . Pyrexia   . Acute kidney injury ([place]) 11/02/2015  . Hyponatremia 11/02/2015  . Metabolic acidosis 38/45/3646  . Hypocalcemia 11/01/2015  . C. difficile colitis 10/31/2015  . Pancolitis ([place]) 10/30/2015  . C. difficile diarrhea 10/30/2015  . Abdominal pain in female 10/30/2015  . Hypotension 10/30/2015  . History of atrial fibrillation- NSR now 10/30/2015  . History of adrenal insufficiency 10/30/2015  . History of hypertension 10/30/2015  . Esophageal reflux   . Nausea   . GERD (gastroesophageal reflux disease) 08/28/2015  . Nausea without vomiting 08/28/2015  . Abdominal pain, epigastric 08/28/2015  . Dark stools 08/28/2015  . Atrial fibrillation with RVR ([place]) 05/12/2014  . Hypokalemia 05/12/2014  . Essential hypertension 05/12/2014  . Severe persistent asthma 05/12/2014  . OSA on CPAP 05/12/2014  . Toxic nodular goiter 05/12/2014  . Morbid obesity ([place]) 05/12/2014  . Pain in joint, shoulder region 08/02/2013  . Muscle weakness (generalized) 08/02/2013  . Decreased range of motion of left shoulder 08/02/2013  . Tight fascia 08/02/2013    Past Surgical History:  Procedure Laterality Date  . ABDOMINAL HYSTERECTOMY    . BACK SURGERY     disc   . CARDIAC CATHETERIZATION  2012 at [facility]  .  CHOLECYSTECTOMY    . CHOLECYSTECTOMY, LAPAROSCOPIC    . CLOSED REDUCTION METACARPAL WITH PERCUTANEOUS PINNING Left 03/26/2016   Procedure: CLOSED REDUCTION METACARPAL WITH PERCUTANEOUS PINNING;  Surgeon: Leanora Cover, MD;  Location: [facility];  Service: Orthopedics;  Laterality: Left;  . COLONOSCOPY  2010   [place]: anal papilla, otherwise normal   . COLONOSCOPY WITH PROPOFOL N/A 03/03/2017   sigmoid divertivcula, 4 mm polyp in rectum (tubular adenoma), non-bleeding internal hemorrhoids, surveillance in 7 years  . ESOPHAGOGASTRODUODENOSCOPY N/A 09/17/2015   [facility]urgicall  altered stomach. NO specimens collected. normal esophagus  . ESOPHAGOGASTRODUODENOSCOPY (EGD) WITH PROPOFOL N/A 03/03/2017   normal esophagus  . eye surg for glaucoma    . gastic by-pass  2008   Gastric by-pass in 2008 at [facility]  . INCISIONAL HERNIA REPAIR N/A 01/26/2019   Procedure: REPAIR OF SUPRAUMBILICAL INCISIONAL HERNIA WITH MESH;  Surgeon: Erroll Luna, MD;  Location: [facility];  Service: General;  Laterality: N/A;  . right knee surg     arthroscopy  . SHOULDER SURGERY Bilateral      OB History   No obstetric history on file.     Family History  Problem Relation Age of Onset  . Heart attack Mother   . Hypertension Mother   . Heart attack Father   . Asthma Father   . Hyperlipidemia Father   . Hypertension Father   . Heart attack Sister   . Arrhythmia Sister   . Asthma Sister   . Hyperlipidemia Sister   . Hypertension Sister   . Asthma Brother   . Hypertension Brother   . Colon cancer Neg Hx     Social History   Tobacco Use  . Smoking status: Never Smoker  . Smokeless tobacco: Never Used  Vaping Use  . Vaping Use: Never used  Substance Use Topics  . Alcohol use: No  . Drug use: No    Home Medications Prior to Admission medications   Medication Sig Start Date End Date Taking? Authorizing Provider  acetaminophen (TYLENOL) 500 MG tablet Take 1,000 mg every 6 (six) hours as needed by mouth (for pain.).    [provider]  albuterol (PROVENTIL HFA;VENTOLIN HFA) 108 (90 BASE) MCG/ACT inhaler Inhale 2 puffs into the lungs every 4 (four) hours as needed for wheezing.     [provider]  amLODipine (NORVASC) 10 MG tablet Take 10 mg by mouth daily. 12/01/18   [provider]  atorvastatin (LIPITOR) 20 MG tablet Take 20 mg by mouth every evening.     [provider]  baclofen (LIORESAL) 10 MG tablet Take 5 mg by mouth at bedtime as needed for muscle spasms. 07/01/19   [provider]  brimonidine (ALPHAGAN) 0.2 %  ophthalmic solution Place 1 drop into both eyes 2 (two) times daily.    [provider]  Calcium Carb-Ergocalciferol 500-200 MG-UNIT TABS Take 1 tablet by mouth daily before breakfast.     [provider]  cyclobenzaprine (FLEXERIL) 5 MG tablet Take 1 tablet (5 mg total) by mouth 2 (two) times daily as needed for muscle spasms. 10/05/19   Avegno, Darrelyn Hillock, FNP  DULoxetine (CYMBALTA) 60 MG capsule Take 60 mg by mouth daily. 11/07/18   [provider]  flunisolide (NASALIDE) 25 MCG/ACT (0.025%) SOLN Place 2 sprays into the nose daily as needed for allergies. 08/12/18   [provider]  hydrocortisone (CORTEF) 10 MG tablet Take 5-10 mg by mouth See admin instructions. Take 1 tablet (10  mg) by mouth in the morning & take 0.5 tablet (5 mg) by mouth in the afternoon. 12/01/18   [provider]  levocetirizine (XYZAL) 5 MG tablet Take 5 mg every evening by mouth.    [provider]  levothyroxine (SYNTHROID) 50 MCG tablet Take 50 mcg by mouth daily before breakfast. 12/01/18   [provider]  LINZESS 145 MCG CAPS capsule TAKE ONE CAPSULE BY MOUTH DAILY BEFORE BREAKFAST. 08/27/19   Mahala Menghini, PA-C  Melatonin 5 MG TABS Take 5 mg by mouth at bedtime.    [provider]  metFORMIN (GLUCOPHAGE-XR) 500 MG 24 hr tablet Take 500 mg by mouth 2 (two) times daily. 12/01/18   [provider]  montelukast (SINGULAIR) 10 MG tablet Take 10 mg by mouth at bedtime.    [provider]  NARCAN 4 MG/0.1ML LIQD nasal spray kit SMARTSIG:Inhalation Both Nares 09/14/19   [provider]  omeprazole (PRILOSEC) 20 MG capsule Take 1 capsule (20 mg total) by mouth daily. 30 minutes before breakfast. 04/25/18   Annitta Needs, NP  ondansetron (ZOFRAN-ODT) 4 MG disintegrating tablet TAKE 1 TABLET BY MOUTH EVERY 8 HOURS AS NEEDED. 08/04/19   Carlis Stable, NP  oxyCODONE ER 13.5 MG C12A Take by mouth. 09/14/19   [provider]  Oxycodone  HCl 10 MG TABS Take 10 mg by mouth every 6 (six) hours as needed. 10/09/19   [provider]  potassium chloride SA (KLOR-CON) 20 MEQ tablet Take 20 mEq by mouth daily.    [provider]  predniSONE (STERAPRED UNI-PAK 21 TAB) 10 MG (21) TBPK tablet Take 6 tablets (60 mg total) by mouth daily for 2 days, THEN 5 tablets (50 mg total) daily for 2 days, THEN 4 tablets (40 mg total) daily for 2 days, THEN 3 tablets (30 mg total) daily for 2 days, THEN 2 tablets (20 mg total) daily for 2 days, THEN 1 tablet (10 mg total) daily for 2 days. 03/03/20 03/15/20  Dorie Rank, MD  pregabalin (LYRICA) 50 MG capsule Take 50 mg by mouth 3 (three) times daily. 11/19/18   [provider]  rivaroxaban (XARELTO) 20 MG TABS tablet Take 20 mg by mouth every evening.  05/10/17   [provider]  SYMBICORT 80-4.5 MCG/ACT inhaler Inhale 2 puffs into the lungs 2 (two) times daily. 08/19/18   [provider]  tizanidine (ZANAFLEX) 2 MG capsule Take 1 capsule (2 mg total) by mouth at bedtime as needed for muscle spasms. 04/17/19   Wurst, [place], PA-C  tizanidine (ZANAFLEX) 2 MG capsule Take 1 capsule (2 mg total) by mouth at bedtime. 10/21/19   Wurst, [place], PA-C  VOLTAREN 1 % GEL Apply 1 application 4 (four) times daily topically.  06/01/16   [provider]  XTAMPZA ER 13.5 MG C12A Take 1 capsule by mouth daily. 07/16/19   [provider]    Allergies    Aspirin, Codeine sulfate, Darvocet [propoxyphene n-acetaminophen], Nsaids, Tramadol, Gabapentin, and Penicillins  Review of Systems   Review of Systems  All other systems reviewed and are negative.   Physical Exam Updated Vital Signs BP (!) 145/60 (BP Location: Right Arm)   Pulse 60   Temp 97.8 F (36.6 C) (Oral)   Resp 16   SpO2 98%   Physical Exam Vitals and nursing note reviewed.  Constitutional:      General: She is not in acute distress.    Appearance: She is well-developed.  HENT:  Head:  Normocephalic and atraumatic.     Right Ear: External ear normal.     Left Ear: External ear normal.  Eyes:     General: No scleral icterus.       Right eye: No discharge.        Left eye: No discharge.     Conjunctiva/sclera: Conjunctivae normal.  Neck:     Trachea: No tracheal deviation.  Cardiovascular:     Rate and Rhythm: Normal rate and regular rhythm.  Pulmonary:     Effort: Pulmonary effort is normal. No respiratory distress.     Breath sounds: No stridor.  Abdominal:     General: There is no distension.  Musculoskeletal:        General: No swelling or deformity.     Cervical back: Neck supple.     Comments: Mild tenderness palpation paraspinal region lumbar spine  Skin:    General: Skin is warm and dry.     Findings: No rash.  Neurological:     Mental Status: She is alert.     Cranial Nerves: Cranial nerve deficit: no gross deficits.     Comments: Normal sensation bilateral lower extremities, 5 out of 5 strength bilateral lower extremities     ED Results / Procedures / Treatments   Labs (all labs ordered are listed, but only abnormal results are displayed) Labs Reviewed - No data to display  EKG None  Radiology No results found.  Procedures Procedures (including critical care time)  Medications Ordered in ED Medications  HYDROmorphone (DILAUDID) injection 1 mg (has no administration in time range)  predniSONE (DELTASONE) tablet 60 mg (has no administration in time range)    ED Course  I have reviewed the triage vital signs and the nursing notes.  Pertinent labs & imaging results that were available during my care of the patient were reviewed by me and considered in my medical decision making (see chart for details).    MDM Rules/Calculators/A&P                          Patient is having symptoms suggestive of sciatica.  No findings to suggest acute infection.  Patient is not having any acute neurologic deficits.  She has normal strength and  sensation.  Patient has discussed her case with her neurosurgeon.  They are planning on an outpatient MRI.  I do not see any reasons that patient needs to have an emergent MRI this evening.  I will give her a dose of pain medications.  She does have pain medication she is able to take at home.  Patient is also interested in steroids again as she did notice some improvement but it started again after she stopped the course.  She was given a 5-day course of prednisone.  I will prescribe her a longer taper. Final Clinical Impression(s) / ED Diagnoses Final diagnoses:  Sciatica of right side    Rx / DC Orders ED Discharge Orders         Ordered    predniSONE (STERAPRED UNI-PAK 21 TAB) 10 MG (21) TBPK tablet        03/03/20 1804           Dorie Rank, MD 03/03/20 [phone]

## 2020-03-03 NOTE — Discharge Instructions (Addendum)
Take the steroid medication as prescribed.  Follow-up with your spine doctor regarding the MRI that they have ordered

## 2020-03-22 ENCOUNTER — Encounter: Payer: Self-pay | Admitting: Emergency Medicine

## 2020-03-22 ENCOUNTER — Ambulatory Visit
Admission: EM | Admit: 2020-03-22 | Discharge: 2020-03-22 | Disposition: A | Discharge status: Home / Self Care | Payer: Medicare Other

## 2020-03-22 DIAGNOSIS — M5441 Lumbago with sciatica, right side: Secondary | ICD-10-CM | POA: Diagnosis not present

## 2020-03-22 DIAGNOSIS — G8929 Other chronic pain: Secondary | ICD-10-CM | POA: Diagnosis not present

## 2020-03-22 NOTE — ED Provider Notes (Signed)
[facility]    CSN: [id number] Arrival date & time: 03/22/20  1111      History   Chief Complaint Chief Complaint  Patient presents with  . Leg Pain    HPI Deborah Murray is a 59 y.o. female.   HPI Patient with complex medical history including spinal stenosis and degenerative disk disease, atrial fibrillation, COPD, renal insufficiency presents today with right sciatica involving RLE. Patient s/p  Spinal fusion less than one month ago.  She is followed by speciality at [facility] neurosurgery and pain management. She is currently prescribed high dose opioids and lyrica reports pain remains unmanaged. In review of EMR, patient and her speciality providers have been in communication with her routinely with a detailed plan. Patient reports that she is unable to drive to [facility] for imaging that her neurosurgeon has ordered. Patient unaware imaging for [facility] can be completed at [place]. Past Medical History:  Diagnosis Date  . Adrenal abnormality ([place])   . Adrenal insufficiency ([place]) 2013   Tx at [facility]  . Anemia   . Arthritis   . Asthma   . Atrial fibrillation ([place])   . Chronic back pain   . COPD (chronic obstructive pulmonary disease) ([place])   . GERD (gastroesophageal reflux disease)   . Glaucoma   . Headache   . Hyperlipidemia   . Hypertension   . Hypothyroidism   . Lumbar radiculopathy   . Sepsis ([place]) 11/04/2015  . Syncope and collapse 05/11/11  adrenal insuffiency  [facility]  . Thyroid disease   . Vocal cord dysfunction     Patient Active Problem List   Diagnosis Date Noted  . LLQ pain 11/22/2018  . Constipation 07/23/2017  . History of bariatric surgery 07/23/2017  . Pain of upper abdomen 05/21/2017  . Rectal bleeding 02/04/2017  . Anasarca 11/18/2015  . Generalized abdominal pain   . Protein-calorie malnutrition, severe 11/05/2015  . Sepsis ([place]) 11/04/2015  . AKI (acute kidney injury) ([place])   . Pyrexia   . Acute  kidney injury ([place]) 11/02/2015  . Hyponatremia 11/02/2015  . Metabolic acidosis 17/71/1657  . Hypocalcemia 11/01/2015  . C. difficile colitis 10/31/2015  . Pancolitis ([place]) 10/30/2015  . C. difficile diarrhea 10/30/2015  . Abdominal pain in female 10/30/2015  . Hypotension 10/30/2015  . History of atrial fibrillation- NSR now 10/30/2015  . History of adrenal insufficiency 10/30/2015  . History of hypertension 10/30/2015  . Esophageal reflux   . Nausea   . GERD (gastroesophageal reflux disease) 08/28/2015  . Nausea without vomiting 08/28/2015  . Abdominal pain, epigastric 08/28/2015  . Dark stools 08/28/2015  . Atrial fibrillation with RVR ([place]) 05/12/2014  . Hypokalemia 05/12/2014  . Essential hypertension 05/12/2014  . Severe persistent asthma 05/12/2014  . OSA on CPAP 05/12/2014  . Toxic nodular goiter 05/12/2014  . Morbid obesity ([place]) 05/12/2014  . Pain in joint, shoulder region 08/02/2013  . Muscle weakness (generalized) 08/02/2013  . Decreased range of motion of left shoulder 08/02/2013  . Tight fascia 08/02/2013    Past Surgical History:  Procedure Laterality Date  . ABDOMINAL HYSTERECTOMY    . BACK SURGERY     disc   . CARDIAC CATHETERIZATION  2012 at [facility]  . CHOLECYSTECTOMY    . CHOLECYSTECTOMY, LAPAROSCOPIC    . CLOSED REDUCTION METACARPAL WITH PERCUTANEOUS PINNING Left 03/26/2016   Procedure: CLOSED REDUCTION METACARPAL WITH PERCUTANEOUS PINNING;  Surgeon: Leanora Cover, MD;  Location: [facility];  Service: Orthopedics;  Laterality: Left;  . COLONOSCOPY  2010   [place]: anal papilla, otherwise normal   . COLONOSCOPY WITH PROPOFOL N/A 03/03/2017   sigmoid divertivcula, 4 mm polyp in rectum (tubular adenoma), non-bleeding internal hemorrhoids, surveillance in 7 years  . ESOPHAGOGASTRODUODENOSCOPY N/A 09/17/2015   Surgicall altered stomach. NO specimens collected. normal esophagus  . ESOPHAGOGASTRODUODENOSCOPY (EGD) WITH PROPOFOL N/A 03/03/2017    normal esophagus  . eye surg for glaucoma    . gastic by-pass  2008   Gastric by-pass in 2008 at [facility]  . INCISIONAL HERNIA REPAIR N/A 01/26/2019   Procedure: REPAIR OF SUPRAUMBILICAL INCISIONAL HERNIA WITH MESH;  Surgeon: Erroll Luna, MD;  Location: [facility];  Service: General;  Laterality: N/A;  . right knee surg     arthroscopy  . SHOULDER SURGERY Bilateral     OB History   No obstetric history on file.      Home Medications    Prior to Admission medications   Medication Sig Start Date End Date Taking? Authorizing Provider  acetaminophen (TYLENOL) 500 MG tablet Take 1,000 mg every 6 (six) hours as needed by mouth (for pain.).    [provider]  albuterol (PROVENTIL HFA;VENTOLIN HFA) 108 (90 BASE) MCG/ACT inhaler Inhale 2 puffs into the lungs every 4 (four) hours as needed for wheezing.     [provider]  amLODipine (NORVASC) 10 MG tablet Take 10 mg by mouth daily. 12/01/18   [provider]  atorvastatin (LIPITOR) 20 MG tablet Take 20 mg by mouth every evening.     [provider]  baclofen (LIORESAL) 10 MG tablet Take 5 mg by mouth at bedtime as needed for muscle spasms. 07/01/19   [provider]  brimonidine (ALPHAGAN) 0.2 % ophthalmic solution Place 1 drop into both eyes 2 (two) times daily.    [provider]  Calcium Carb-Ergocalciferol 500-200 MG-UNIT TABS Take 1 tablet by mouth daily before breakfast.     [provider]  cyclobenzaprine (FLEXERIL) 5 MG tablet Take 1 tablet (5 mg total) by mouth 2 (two) times daily as needed for muscle spasms. 10/05/19   Avegno, Darrelyn Hillock, FNP  DULoxetine (CYMBALTA) 60 MG capsule Take 60 mg by mouth daily. 11/07/18   [provider]  flunisolide (NASALIDE) 25 MCG/ACT (0.025%) SOLN Place 2 sprays into the nose daily as needed for allergies. 08/12/18   [provider]  hydrocortisone (CORTEF) 10 MG tablet Take 5-10 mg by mouth See admin instructions. Take  1 tablet (10 mg) by mouth in the morning & take 0.5 tablet (5 mg) by mouth in the afternoon. 12/01/18   [provider]  levocetirizine (XYZAL) 5 MG tablet Take 5 mg every evening by mouth.    [provider]  levothyroxine (SYNTHROID) 50 MCG tablet Take 50 mcg by mouth daily before breakfast. 12/01/18   [provider]  LINZESS 145 MCG CAPS capsule TAKE ONE CAPSULE BY MOUTH DAILY BEFORE BREAKFAST. 08/27/19   Mahala Menghini, PA-C  Melatonin 5 MG TABS Take 5 mg by mouth at bedtime.    [provider]  metFORMIN (GLUCOPHAGE-XR) 500 MG 24 hr tablet Take 500 mg by mouth 2 (two) times daily. 12/01/18   [provider]  montelukast (SINGULAIR) 10 MG tablet Take 10 mg by mouth at bedtime.    [provider]  NARCAN 4 MG/0.1ML LIQD nasal spray kit SMARTSIG:Inhalation Both Nares 09/14/19   [provider]  omeprazole (PRILOSEC) 20 MG capsule Take  1 capsule (20 mg total) by mouth daily. 30 minutes before breakfast. 04/25/18   Annitta Needs, NP  ondansetron (ZOFRAN-ODT) 4 MG disintegrating tablet TAKE 1 TABLET BY MOUTH EVERY 8 HOURS AS NEEDED. 08/04/19   Carlis Stable, NP  oxyCODONE ER 13.5 MG C12A Take by mouth. 09/14/19   [provider]  Oxycodone HCl 10 MG TABS Take 10 mg by mouth every 6 (six) hours as needed. 10/09/19   [provider]  potassium chloride SA (KLOR-CON) 20 MEQ tablet Take 20 mEq by mouth daily.    [provider]  pregabalin (LYRICA) 50 MG capsule Take 50 mg by mouth 3 (three) times daily. 11/19/18   [provider]  rivaroxaban (XARELTO) 20 MG TABS tablet Take 20 mg by mouth every evening.  05/10/17   [provider]  SYMBICORT 80-4.5 MCG/ACT inhaler Inhale 2 puffs into the lungs 2 (two) times daily. 08/19/18   [provider]  tizanidine (ZANAFLEX) 2 MG capsule Take 1 capsule (2 mg total) by mouth at bedtime as needed for muscle spasms. 04/17/19   Wurst, [place], PA-C  tizanidine  (ZANAFLEX) 2 MG capsule Take 1 capsule (2 mg total) by mouth at bedtime. 10/21/19   Wurst, [place], PA-C  VOLTAREN 1 % GEL Apply 1 application 4 (four) times daily topically.  06/01/16   [provider]  XTAMPZA ER 13.5 MG C12A Take 1 capsule by mouth daily. 07/16/19   [provider]    Family History Family History  Problem Relation Age of Onset  . Heart attack Mother   . Hypertension Mother   . Heart attack Father   . Asthma Father   . Hyperlipidemia Father   . Hypertension Father   . Heart attack Sister   . Arrhythmia Sister   . Asthma Sister   . Hyperlipidemia Sister   . Hypertension Sister   . Asthma Brother   . Hypertension Brother   . Colon cancer Neg Hx     Social History Social History   Tobacco Use  . Smoking status: Never Smoker  . Smokeless tobacco: Never Used  Vaping Use  . Vaping Use: Never used  Substance Use Topics  . Alcohol use: No  . Drug use: No     Allergies   Aspirin, Codeine sulfate, Darvocet [propoxyphene n-acetaminophen], Nsaids, Tramadol, Gabapentin, and Penicillins   Review of Systems Review of Systems Pertinent negatives listed in HPI Physical Exam Triage Vital Signs ED Triage Vitals  Enc Vitals Group     BP 03/22/20 1148 (!) 140/92     Pulse Rate 03/22/20 1148 61     Resp 03/22/20 1148 19     Temp 03/22/20 1148 98.2 F (36.8 C)     Temp Source 03/22/20 1148 Oral     SpO2 03/22/20 1148 97 %     Weight 03/22/20 1147 198 lb 6.6 oz (90 kg)     Height 03/22/20 1147 5' 2"  (1.575 m)     Head Circumference --      Peak Flow --      Pain Score 03/22/20 1146 8     Pain Loc --      Pain Edu? --      Excl. in [place]? --    No data found.  Updated Vital Signs BP (!) 140/92 (BP Location: Right Arm)   Pulse 61   Temp 98.2 F (36.8 C) (Oral)   Resp 19   Ht 5' 2"  (1.575 m)   Wt  198 lb 6.6 oz (90 kg)   SpO2 97%   BMI 36.29 kg/m   Visual Acuity Right Eye Distance:   Left Eye Distance:   Bilateral Distance:     Right Eye Near:   Left Eye Near:    Bilateral Near:     Physical Exam General appearance: alert, chronically ill appearing, no distress Respiratory: Respirations even and unlabored, normal respiratory rate Heart: Rate and rhythm normal. Neurological: Alert, gait antalgic (use of walker for ambulation) Skin: Skin color, texture, turgor normal. No rashes seen  Psych: Appropriate mood and affect.    UC Treatments / Results  Labs (all labs ordered are listed, but only abnormal results are displayed) Labs Reviewed - No data to display  EKG   Radiology No results found.  Procedures Procedures (including critical care time)  Medications Ordered in UC Medications - No data to display  Initial Impression / Assessment and Plan / UC Course  I have reviewed the triage vital signs and the nursing notes.  Pertinent labs & imaging results that were available during my care of the patient were reviewed by me and considered in my medical decision making (see chart for details).    Explain in detail that patient should follow-up recommendations of PCP, neurosurgery and pain management. Explained given adrenal insufficiency and prolonged use of prednisone, unable to prescribed steriodal tx for pain. She is already on large doses of pain medication recommendation if current regimen is ineffective, discuss with pain management provider. Final Clinical Impressions(s) / UC Diagnoses   Final diagnoses:  Chronic right-sided low back pain with right-sided sciatica     Discharge Instructions     Contact your back specialist at [facility] and request outpatient imaging at [facility]  [place], [place], [place] [address] Phone: [phone] You are currently  under pain management and spine specialty management for your chronic back disease, unfortunately, urgent care is unable to manage this is a chronic problem and require management by your current specialist team.  Continue  current medication regimen until otherwise instructed by pain management provider    ED Prescriptions    None     PDMP not reviewed this encounter.   Scot Jun, FNP 04/01/20 1655

## 2020-03-22 NOTE — ED Triage Notes (Signed)
Pain to whole RT leg x2-3 weeks.  Reports she had a back surgery for disc in August. Shortly after her surgery she began to experience RT leg pain similar to what she is having now.  Pt had hematoma on her spine and had to have another surgery.  Pt has not had any further pain up until 2-3 weeks ago.

## 2020-03-22 NOTE — Discharge Instructions (Addendum)
Contact your back specialist at [facility] and request outpatient imaging at [facility]  [address], [place], [place] [address] Phone: [phone] You are currently  under pain management and spine specialty management for your chronic back disease, unfortunately, urgent care is unable to manage this is a chronic problem and require management by your current specialist team.  Continue current medication regimen until otherwise instructed by pain management provider

## 2020-03-25 ENCOUNTER — Emergency Department (HOSPITAL_COMMUNITY)
Admission: EM | Admit: 2020-03-25 | Discharge: 2020-03-25 | Disposition: A | Discharge status: Home / Self Care | Payer: Medicare Other | Attending: Emergency Medicine | Admitting: Emergency Medicine

## 2020-03-25 ENCOUNTER — Other Ambulatory Visit: Payer: Self-pay

## 2020-03-25 DIAGNOSIS — Z79899 Other long term (current) drug therapy: Secondary | ICD-10-CM | POA: Diagnosis not present

## 2020-03-25 DIAGNOSIS — J4551 Severe persistent asthma with (acute) exacerbation: Secondary | ICD-10-CM | POA: Insufficient documentation

## 2020-03-25 DIAGNOSIS — E039 Hypothyroidism, unspecified: Secondary | ICD-10-CM | POA: Diagnosis not present

## 2020-03-25 DIAGNOSIS — I10 Essential (primary) hypertension: Secondary | ICD-10-CM | POA: Diagnosis not present

## 2020-03-25 DIAGNOSIS — M79604 Pain in right leg: Secondary | ICD-10-CM | POA: Diagnosis present

## 2020-03-25 DIAGNOSIS — M5431 Sciatica, right side: Secondary | ICD-10-CM | POA: Diagnosis not present

## 2020-03-25 DIAGNOSIS — J449 Chronic obstructive pulmonary disease, unspecified: Secondary | ICD-10-CM | POA: Insufficient documentation

## 2020-03-25 DIAGNOSIS — Z7901 Long term (current) use of anticoagulants: Secondary | ICD-10-CM | POA: Diagnosis not present

## 2020-03-25 DIAGNOSIS — R202 Paresthesia of skin: Secondary | ICD-10-CM | POA: Insufficient documentation

## 2020-03-25 MED ORDER — HYDROMORPHONE HCL 1 MG/ML IJ SOLN
0.5000 mg | Freq: Once | INTRAMUSCULAR | Status: AC
  Administered 2020-03-25: 0.5 mg via INTRAMUSCULAR
  Filled 2020-03-25: qty 1

## 2020-03-25 NOTE — Discharge Instructions (Signed)
At this time there does not appear to be the presence of an emergent medical condition, however there is always the potential for conditions to change. Please read and follow the below instructions.  Please return to the Emergency Department immediately for any new or worsening symptoms. Please be sure to follow up with your Primary Care Provider within one week regarding your visit today; please call their office to schedule an appointment even if you are feeling better for a follow-up visit. You received pain medication in the ER called Dilaudid. This medication will make you drowsy so do not drive, drink alcohol or perform any potentially dangerous activities for the rest of the day. Do not take additional narcotic medications or other medications that may make you drowsy today due to increased drowsiness.  Go to the nearest Emergency Department immediately if: You have fever or chills You cannot control when you pee (urinate) or poop (have a bowel movement). You have weakness in any of these areas and it gets worse: Lower back. The area between your hip bones. Butt. Legs. You have redness or swelling of your back. You have a burning feeling when you pee.   Please read the additional information packets attached to your discharge summary.  Do not take your medicine if  develop an itchy rash, swelling in your mouth or lips, or difficulty breathing; call 911 and seek immediate emergency medical attention if this occurs.  You may review your lab tests and imaging results in their entirety on your MyChart account.  Please discuss all results of fully with your primary care provider and other specialist at your follow-up visit.  Note: Portions of this text may have been transcribed using voice recognition software. Every effort was made to ensure accuracy; however, inadvertent computerized transcription errors may still be present.

## 2020-03-25 NOTE — ED Triage Notes (Signed)
Pt c/o right leg pain that started about a week ago. Pt states she has had hematoma on same leg after having back surgery and states she thinks it may have happened again.

## 2020-03-25 NOTE — ED Provider Notes (Signed)
[facility] EMERGENCY DEPARTMENT Provider Note   CSN: [id number] Arrival date & time: 03/25/20  1557     History Chief Complaint  Patient presents with  . Leg Pain    Deborah Murray is a 59 y.o. female history of obesity, chronic back pain, sciatica, A. fib on Xarelto, COPD, GERD, hyperlipidemia, hypertension.  Patient presents today for right buttock and right leg pain ongoing x8 days. She reports a history of right-sided sciatica and feels this is exactly similar to her sciatica pain she describes a sharp pain that radiates down her right leg it is constant for the past 8 days worsened with movement and standing improved with rest and her oxycodone 10 mg that she takes daily. She reports that she tried to call her spine specialist but they did not answer their phone today so she came into the ER for evaluation she is requesting pain relief. Symptoms associated with occasional tingling of her right foot.  Patient denies fall/injury, fever/chills, chest pain, abdominal pain, nausea/vomiting, dysuria/hematuria, saddle paresthesias, bowel/bladder incontinence, urinary retention, IV drug use or any additional concerns.  HPI     Past Medical History:  Diagnosis Date  . Adrenal abnormality ([place])   . Adrenal insufficiency ([place]) 2013   Tx at [facility]  . Anemia   . Arthritis   . Asthma   . Atrial fibrillation ([place])   . Chronic back pain   . COPD (chronic obstructive pulmonary disease) ([place])   . GERD (gastroesophageal reflux disease)   . Glaucoma   . Headache   . Hyperlipidemia   . Hypertension   . Hypothyroidism   . Lumbar radiculopathy   . Sepsis ([place]) 11/04/2015  . Syncope and collapse 05/11/11  adrenal insuffiency  treated at [facility]  . Thyroid disease   . Vocal cord dysfunction     Patient Active Problem List   Diagnosis Date Noted  . LLQ pain 11/22/2018  . Constipation 07/23/2017  . History of bariatric surgery 07/23/2017  . Pain of upper abdomen 05/21/2017  . Rectal  bleeding 02/04/2017  . Anasarca 11/18/2015  . Generalized abdominal pain   . Protein-calorie malnutrition, severe 11/05/2015  . Sepsis ([place]) 11/04/2015  . AKI (acute kidney injury) ([place])   . Pyrexia   . Acute kidney injury ([place]) 11/02/2015  . Hyponatremia 11/02/2015  . Metabolic acidosis 53/66/4403  . Hypocalcemia 11/01/2015  . C. difficile colitis 10/31/2015  . Pancolitis ([place]) 10/30/2015  . C. difficile diarrhea 10/30/2015  . Abdominal pain in female 10/30/2015  . Hypotension 10/30/2015  . History of atrial fibrillation- NSR now 10/30/2015  . History of adrenal insufficiency 10/30/2015  . History of hypertension 10/30/2015  . Esophageal reflux   . Nausea   . GERD (gastroesophageal reflux disease) 08/28/2015  . Nausea without vomiting 08/28/2015  . Abdominal pain, epigastric 08/28/2015  . Dark stools 08/28/2015  . Atrial fibrillation with RVR ([place]) 05/12/2014  . Hypokalemia 05/12/2014  . Essential hypertension 05/12/2014  . Severe persistent asthma 05/12/2014  . OSA on CPAP 05/12/2014  . Toxic nodular goiter 05/12/2014  . Morbid obesity ([place]) 05/12/2014  . Pain in joint, shoulder region 08/02/2013  . Muscle weakness (generalized) 08/02/2013  . Decreased range of motion of left shoulder 08/02/2013  . Tight fascia 08/02/2013    Past Surgical History:  Procedure Laterality Date  . ABDOMINAL HYSTERECTOMY    . BACK SURGERY     disc   . CARDIAC CATHETERIZATION  2012 at [facility]  . CHOLECYSTECTOMY    .  CHOLECYSTECTOMY, LAPAROSCOPIC    . CLOSED REDUCTION METACARPAL WITH PERCUTANEOUS PINNING Left 03/26/2016   Procedure: CLOSED REDUCTION METACARPAL WITH PERCUTANEOUS PINNING;  Surgeon: Leanora Cover, MD;  Location: [facility];  Service: Orthopedics;  Laterality: Left;  . COLONOSCOPY  2010   [place]: anal papilla, otherwise normal   . COLONOSCOPY WITH PROPOFOL N/A 03/03/2017   sigmoid divertivcula, 4 mm polyp in rectum (tubular adenoma), non-bleeding internal  hemorrhoids, surveillance in 7 years  . ESOPHAGOGASTRODUODENOSCOPY N/A 09/17/2015   Surgicall altered stomach. NO specimens collected. normal esophagus  . ESOPHAGOGASTRODUODENOSCOPY (EGD) WITH PROPOFOL N/A 03/03/2017   normal esophagus  . eye surg for glaucoma    . gastic by-pass  2008   G[facility]pass in [facility]  . INCISIONAL HERNIA REPAIR N/A 01/26/2019   Procedure: REPAIR OF SUPRAUMBILICAL INCISIONAL HERNIA WITH MESH;  Surgeon: Erroll Luna, MD;  Location: [facility];  Service: General;  Laterality: N/A;  . right knee surg     arthroscopy  . SHOULDER SURGERY Bilateral      OB History   No obstetric history on file.     Family History  Problem Relation Age of Onset  . Heart attack Mother   . Hypertension Mother   . Heart attack Father   . Asthma Father   . Hyperlipidemia Father   . Hypertension Father   . Heart attack Sister   . Arrhythmia Sister   . Asthma Sister   . Hyperlipidemia Sister   . Hypertension Sister   . Asthma Brother   . Hypertension Brother   . Colon cancer Neg Hx     Social History   Tobacco Use  . Smoking status: Never Smoker  . Smokeless tobacco: Never Used  Vaping Use  . Vaping Use: Never used  Substance Use Topics  . Alcohol use: No  . Drug use: No    Home Medications Prior to Admission medications   Medication Sig Start Date End Date Taking? Authorizing Provider  acetaminophen (TYLENOL) 500 MG tablet Take 1,000 mg every 6 (six) hours as needed by mouth (for pain.).    [provider]  albuterol (PROVENTIL HFA;VENTOLIN HFA) 108 (90 BASE) MCG/ACT inhaler Inhale 2 puffs into the lungs every 4 (four) hours as needed for wheezing.     [provider]  amLODipine (NORVASC) 10 MG tablet Take 10 mg by mouth daily. 12/01/18   [provider]  atorvastatin (LIPITOR) 20 MG tablet Take 20 mg by mouth every evening.     [provider]  baclofen (LIORESAL) 10 MG tablet Take 5 mg by mouth at bedtime as  needed for muscle spasms. 07/01/19   [provider]  brimonidine (ALPHAGAN) 0.2 % ophthalmic solution Place 1 drop into both eyes 2 (two) times daily.    [provider]  Calcium Carb-Ergocalciferol 500-200 MG-UNIT TABS Take 1 tablet by mouth daily before breakfast.     [provider]  cyclobenzaprine (FLEXERIL) 5 MG tablet Take 1 tablet (5 mg total) by mouth 2 (two) times daily as needed for muscle spasms. 10/05/19   Avegno, Darrelyn Hillock, FNP  DULoxetine (CYMBALTA) 60 MG capsule Take 60 mg by mouth daily. 11/07/18   [provider]  flunisolide (NASALIDE) 25 MCG/ACT (0.025%) SOLN Place 2 sprays into the nose daily as needed for allergies. 08/12/18   [provider]  hydrocortisone (CORTEF) 10 MG tablet Take 5-10 mg by mouth See admin instructions. Take 1 tablet (10 mg) by mouth in the  morning & take 0.5 tablet (5 mg) by mouth in the afternoon. 12/01/18   [provider]  levocetirizine (XYZAL) 5 MG tablet Take 5 mg every evening by mouth.    [provider]  levothyroxine (SYNTHROID) 50 MCG tablet Take 50 mcg by mouth daily before breakfast. 12/01/18   [provider]  LINZESS 145 MCG CAPS capsule TAKE ONE CAPSULE BY MOUTH DAILY BEFORE BREAKFAST. 08/27/19   Mahala Menghini, PA-C  Melatonin 5 MG TABS Take 5 mg by mouth at bedtime.    [provider]  metFORMIN (GLUCOPHAGE-XR) 500 MG 24 hr tablet Take 500 mg by mouth 2 (two) times daily. 12/01/18   [provider]  montelukast (SINGULAIR) 10 MG tablet Take 10 mg by mouth at bedtime.    [provider]  NARCAN 4 MG/0.1ML LIQD nasal spray kit SMARTSIG:Inhalation Both Nares 09/14/19   [provider]  omeprazole (PRILOSEC) 20 MG capsule Take 1 capsule (20 mg total) by mouth daily. 30 minutes before breakfast. 04/25/18   Annitta Needs, NP  ondansetron (ZOFRAN-ODT) 4 MG disintegrating tablet TAKE 1 TABLET BY MOUTH EVERY 8 HOURS AS NEEDED. 08/04/19   Carlis Stable,  NP  oxyCODONE ER 13.5 MG C12A Take by mouth. 09/14/19   [provider]  Oxycodone HCl 10 MG TABS Take 10 mg by mouth every 6 (six) hours as needed. 10/09/19   [provider]  potassium chloride SA (KLOR-CON) 20 MEQ tablet Take 20 mEq by mouth daily.    [provider]  pregabalin (LYRICA) 50 MG capsule Take 50 mg by mouth 3 (three) times daily. 11/19/18   [provider]  rivaroxaban (XARELTO) 20 MG TABS tablet Take 20 mg by mouth every evening.  05/10/17   [provider]  SYMBICORT 80-4.5 MCG/ACT inhaler Inhale 2 puffs into the lungs 2 (two) times daily. 08/19/18   [provider]  tizanidine (ZANAFLEX) 2 MG capsule Take 1 capsule (2 mg total) by mouth at bedtime as needed for muscle spasms. 04/17/19   Wurst, [place], PA-C  tizanidine (ZANAFLEX) 2 MG capsule Take 1 capsule (2 mg total) by mouth at bedtime. 10/21/19   Wurst, [place], PA-C  VOLTAREN 1 % GEL Apply 1 application 4 (four) times daily topically.  06/01/16   [provider]  XTAMPZA ER 13.5 MG C12A Take 1 capsule by mouth daily. 07/16/19   [provider]    Allergies    Aspirin, Codeine sulfate, Darvocet [propoxyphene n-acetaminophen], Nsaids, Tramadol, Gabapentin, and Penicillins  Review of Systems   Review of Systems  Constitutional: Negative.  Negative for chills and fever.  Respiratory: Negative.  Negative for shortness of breath.   Cardiovascular: Negative.  Negative for chest pain.  Gastrointestinal: Negative.  Negative for abdominal pain, nausea and vomiting.  Genitourinary: Negative.  Negative for dysuria and hematuria.  Musculoskeletal: Positive for back pain. Negative for neck pain.  Neurological: Positive for numbness (Occasional paresthesias right foot which she reports is normal for her sciatica). Negative for weakness.       Denies saddle area paresthesias. Denies bowel/bladder incontinence. Denies urinary retention.    Physical Exam Updated Vital  Signs BP (!) 133/98 (BP Location: Right Arm)   Pulse (!) 52   Temp 98.1 F (36.7 C) (Oral)   Resp 18   SpO2 100%   Physical Exam Constitutional:      General: She is not in acute distress.    Appearance: Normal appearance. She is well-developed. She is not  ill-appearing or diaphoretic.  HENT:     Head: Normocephalic and atraumatic.  Eyes:     General: Vision grossly intact. Gaze aligned appropriately.     Pupils: Pupils are equal, round, and reactive to light.  Neck:     Trachea: Trachea and phonation normal.  Cardiovascular:     Rate and Rhythm: Normal rate and regular rhythm.     Pulses:          Dorsalis pedis pulses are 2+ on the right side and 2+ on the left side.  Pulmonary:     Effort: Pulmonary effort is normal. No respiratory distress.  Abdominal:     General: There is no distension.     Palpations: Abdomen is soft.     Tenderness: There is no abdominal tenderness. There is no guarding or rebound.  Musculoskeletal:        General: Normal range of motion.     Cervical back: Normal range of motion.     Right lower leg: 1+ Edema present.     Left lower leg: 1+ Edema present.     Comments: No midline C/T/L spinal tenderness to palpation, no paraspinal muscle tenderness, no deformity, crepitus, or step-off noted. No sign of injury to the neck or back. Well-healed surgical scars of the lower back. - Mild tenderness to palpation of the right gluteal muscles. Patient refused to remove her pants for examination. Positive right straight leg raise.  Feet:     Right foot:     Protective Sensation: 3 sites tested. 3 sites sensed.     Left foot:     Protective Sensation: 3 sites tested. 3 sites sensed.  Skin:    General: Skin is warm and dry.  Neurological:     Mental Status: She is alert.     GCS: GCS eye subscore is 4. GCS verbal subscore is 5. GCS motor subscore is 6.     Comments: Speech is clear and goal oriented, follows commands Major Cranial nerves without deficit,  no facial droop Normal strength in lower extremities bilaterally including dorsiflexion and plantar flexion Sensation normal to light and sharp touch Moves extremities without ataxia, coordination intact No clonus of the feet.  Psychiatric:        Behavior: Behavior normal.    ED Results / Procedures / Treatments   Labs (all labs ordered are listed, but only abnormal results are displayed) Labs Reviewed - No data to display  EKG None  Radiology No results found.  Procedures Procedures (including critical care time)  Medications Ordered in ED Medications  HYDROmorphone (DILAUDID) injection 0.5 mg (has no administration in time range)    ED Course  I have reviewed the triage vital signs and the nursing notes.  Pertinent labs & imaging results that were available during my care of the patient were reviewed by me and considered in my medical decision making (see chart for details).    MDM Rules/Calculators/A&P                         Additional history obtained from: 1. Nursing notes from this visit. 2. Review of electronic medical records. Patient has multiple ER visits in the last month for right-sided sciatica. On a visit March 03, 2020 she was given a prednisone taper.  Deborah Murray is a 60 y.o. female presenting with right-sided back and leg pain which she reports is exactly typical for her right-sided sciatica and without abnormal feature. Patient  denies history of trauma, fever, IV drug use, night sweats, weight loss, cancer, saddle anesthesia, urinary rentention, bowel/bladder incontinence. No neurological deficits and normal neuro exam. Pain is consistently reproducible with straight leg raise and palpation of the right gluteal muscles. She has no overlying skin change or signs of infection of the back. Patient did mention a hematoma on her back after one of her surgeries but she has no evidence of abnormalities on physical examination today. Additionally she  refused to remove her pants but denies concern of any hematoma or overlying skin changes of the buttocks. Abdomen soft/nontender and without pulsatile mass. Patient with equal pedal pulses. Doubt spinal epidural abscess, cauda equina, AAA, kidney stone disease, pyelonephritis or other emergent pathologies. There is no indication for imaging at this time. I reviewed treatment options with patient, she already sees a spine specialist. She reports she has been taking NSAIDs Tylenol and oxycodone with minimal relief. She has recently finished a taper of prednisone so will not give additional steroids at this time. She does not want to try muscle relaxers as she reports that exacerbate her pain. She also does not want Voltaren gel or Lidoderm patches.  PMD P reviewed and patient receives 90 pills oxycodone 10 mg 6 days ago.  Extensive conversation was held with the patient and we discussed multiple options, patient's daughter is driving her home today, will give one dose of Dilaudid here in the emergency department, patient will call her spine specialist tomorrow morning to schedule follow-up appointment. She will continue all her home remedies. I discussed narcotic precautions with patient she states understanding.  At this time there does not appear to be any evidence of an acute emergency medical condition and the patient appears stable for discharge with appropriate outpatient follow up. Diagnosis was discussed with patient who verbalizes understanding of care plan and is agreeable to discharge. I have discussed return precautions with patient who verbalizes understanding. Patient encouraged to follow-up with their PCP and spine specialist. All questions answered.   Note: Portions of this report may have been transcribed using voice recognition software. Every effort was made to ensure accuracy; however, inadvertent computerized transcription errors may still be present.  Final Clinical Impression(s) / ED  Diagnoses Final diagnoses:  Right sided sciatica    Rx / DC Orders ED Discharge Orders    None       Gari Crown 03/25/20 2025    Milton Ferguson, MD 03/26/20 2216

## 2020-03-27 ENCOUNTER — Other Ambulatory Visit: Payer: Self-pay | Admitting: Nurse Practitioner

## 2020-08-28 ENCOUNTER — Other Ambulatory Visit: Payer: Self-pay | Admitting: Gastroenterology

## 2020-10-24 ENCOUNTER — Encounter: Payer: Self-pay | Admitting: Gastroenterology

## 2020-10-24 ENCOUNTER — Ambulatory Visit (INDEPENDENT_AMBULATORY_CARE_PROVIDER_SITE_OTHER): Payer: 59 | Admitting: Gastroenterology

## 2020-10-24 ENCOUNTER — Other Ambulatory Visit: Payer: Self-pay

## 2020-10-24 VITALS — BP 98/60 | HR 57 | Temp 97.5°F | Ht 62.0 in | Wt 216.0 lb

## 2020-10-24 DIAGNOSIS — K219 Gastro-esophageal reflux disease without esophagitis: Secondary | ICD-10-CM | POA: Diagnosis not present

## 2020-10-24 DIAGNOSIS — K5900 Constipation, unspecified: Secondary | ICD-10-CM

## 2020-10-24 DIAGNOSIS — D509 Iron deficiency anemia, unspecified: Secondary | ICD-10-CM

## 2020-10-24 NOTE — Progress Notes (Signed)
Referring Provider: Sol Passer, MD Primary Care Physician:  Sol Passer, MD Primary GI: Dr. Gala Romney   Chief Complaint  Patient presents with   Colonoscopy    Wants to discuss   Constipation    HPI:   Deborah Murray is a 60 y.o. 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 Colonoscopy Nov 2018 with sigmoid diverticula, 4 mm polyp in rectum (tubular adenoma), non-bleeding internal hemorrhoids, surveillance in 7 years planned. EGD with normal esophagus, s/p hemigastrectomy in 2018.  Complicated spine surgery in Jan 2022 at outside hospital with post-op infection requiring irrigation and debridement.   May 2022: hgb 10.8, ferritin 21.9. April 2021: Hgb 12.4. Ferritin earlier in the 90s. Low iron. Slow decline in Hgb over past year. No melena or hematochezia. She has been taking iron.   Constipation: Linzess 145 mcg daily. BM about twice a week. Taking Linzess daily.   GERD: well-controlled.   No abdominal pain. Feels tired some days but not often.     Past Medical History:  Diagnosis Date   Adrenal abnormality ([place])    Adrenal insufficiency ([place]) 2013   Tx at [facility]   Anemia    Arthritis    Asthma    Atrial fibrillation (HCC)    Chronic back pain    COPD (chronic obstructive pulmonary disease) (HCC)    GERD (gastroesophageal reflux disease)    Glaucoma    Headache    Hyperlipidemia    Hypertension    Hypothyroidism    Lumbar radiculopathy    Sepsis ([place]) 11/04/2015   Syncope and collapse 05/11/11  adrenal insuffiency  treated at [facility]   Thyroid disease    Vocal cord dysfunction     Past Surgical History:  Procedure Laterality Date   ABDOMINAL HYSTERECTOMY     BACK SURGERY     disc    CARDIAC CATHETERIZATION  2012 at [place] WITH  PERCUTANEOUS PINNING Left 03/26/2016   Procedure: [place];  Surgeon: Leanora Cover, MD;  Location: [facility];  Service: Orthopedics;  Laterality: Left;   COLONOSCOPY  2010   [place]: anal papilla, otherwise normal    COLONOSCOPY WITH PROPOFOL N/A 03/03/2017   sigmoid divertivcula, 4 mm polyp in rectum (tubular adenoma), non-bleeding internal hemorrhoids, surveillance in 7 years   ESOPHAGOGASTRODUODENOSCOPY N/A 09/17/2015   Surgicall altered stomach. NO specimens collected. normal esophagus   ESOPHAGOGASTRODUODENOSCOPY (EGD) WITH PROPOFOL N/A 03/03/2017   normal esophagus   eye surg for glaucoma     gastic by-pass  2008   Gastric by-pass in 2008 at [place] 01/26/2019   Procedure: REPAIR OF SUPRAUMBILICAL INCISIONAL HERNIA WITH MESH;  Surgeon: Erroll Luna, MD;  Location: [facility];  Service: General;  Laterality: N/A;   right knee surg     arthroscopy   SHOULDER SURGERY Bilateral     Current Outpatient Medications  Medication Sig Dispense Refill   acetaminophen (TYLENOL) 500 MG tablet Take 1,000 mg every 6 (six) hours as needed by mouth (for pain.).     albuterol (PROVENTIL HFA;VENTOLIN HFA) 108 (90 BASE) MCG/ACT inhaler Inhale 2 puffs into the lungs every 4 (four) hours as needed for wheezing.      amLODipine (NORVASC) 10  MG tablet Take 10 mg by mouth daily.     atorvastatin (LIPITOR) 20 MG tablet Take 20 mg by mouth every evening.      brimonidine (ALPHAGAN) 0.2 % ophthalmic solution Place 1 drop into both eyes 2 (two) times daily.     Calcium Carb-Ergocalciferol 500-200 MG-UNIT TABS Take 1 tablet by mouth daily before breakfast.      DULoxetine (CYMBALTA) 60 MG capsule Take 60 mg by mouth daily.     flunisolide (NASALIDE) 25 MCG/ACT (0.025%) SOLN Place 2 sprays into the nose daily as needed for allergies.     hydrocortisone (CORTEF) 10 MG tablet Take 5-10 mg by mouth See admin instructions.  Take 1 tablet (10 mg) by mouth in the morning & take 0.5 tablet (5 mg) by mouth in the afternoon.     levocetirizine (XYZAL) 5 MG tablet Take 5 mg every evening by mouth.     levothyroxine (SYNTHROID) 50 MCG tablet Take 50 mcg by mouth daily before breakfast.     LINZESS 145 MCG CAPS capsule TAKE ONE CAPSULE BY MOUTH DAILY BEFORE BREAKFAST. 90 capsule 3   Melatonin 5 MG TABS Take 5 mg by mouth at bedtime.     montelukast (SINGULAIR) 10 MG tablet Take 10 mg by mouth at bedtime.     NARCAN 4 MG/0.1ML LIQD nasal spray kit SMARTSIG:Inhalation Both Nares     omalizumab (XOLAIR) 150 MG injection Inject 300 mg into the skin every 28 (twenty-eight) days.     omeprazole (PRILOSEC) 20 MG capsule Take 1 capsule (20 mg total) by mouth daily. 30 minutes before breakfast. 90 capsule 3   ondansetron (ZOFRAN-ODT) 4 MG disintegrating tablet TAKE 1 TABLET BY MOUTH EVERY 8 HOURS AS NEEDED. 60 tablet 1   Oxycodone HCl 10 MG TABS Take 15 mg by mouth every 6 (six) hours as needed.     potassium chloride SA (KLOR-CON) 20 MEQ tablet Take 20 mEq by mouth daily.     pregabalin (LYRICA) 50 MG capsule Take 50 mg by mouth 3 (three) times daily.     rivaroxaban (XARELTO) 20 MG TABS tablet Take 20 mg by mouth every evening.      SYMBICORT 80-4.5 MCG/ACT inhaler Inhale 2 puffs into the lungs 2 (two) times daily.     VOLTAREN 1 % GEL Apply 1 application 4 (four) times daily topically.      No current facility-administered medications for this visit.    Allergies as of 10/24/2020 - Review Complete 10/24/2020  Allergen Reaction Noted   Aspirin Shortness Of Breath and Palpitations 05/11/2011   Codeine sulfate Shortness Of Breath and Palpitations 05/11/2011   Darvocet [propoxyphene n-acetaminophen] Shortness Of Breath and Palpitations 05/11/2011   Nsaids  02/15/2013   Tramadol Shortness Of Breath 10/30/2014   Gabapentin Other (See Comments) 02/02/2017   Penicillins Other (See Comments) 05/11/2011    Family History   Problem Relation Age of Onset   Heart attack Mother    Hypertension Mother    Heart attack Father    Asthma Father    Hyperlipidemia Father    Hypertension Father    Heart attack Sister    Arrhythmia Sister    Asthma Sister    Hyperlipidemia Sister    Hypertension Sister    Asthma Brother    Hypertension Brother    Colon cancer Neg Hx     Social History   Socioeconomic History   Marital status: Single    Spouse name: Not on file   Number of  children: Not on file   Years of education: Not on file   Highest education level: Not on file  Occupational History   Occupation: disability   Tobacco Use   Smoking status: Never   Smokeless tobacco: Never  Vaping Use   Vaping Use: Never used  Substance and Sexual Activity   Alcohol use: No   Drug use: No   Sexual activity: Never  Other Topics Concern   Not on file  Social History Narrative   Not on file   Social Determinants of Health   Financial Resource Strain: Not on file  Food Insecurity: Not on file  Transportation Needs: Not on file  Physical Activity: Not on file  Stress: Not on file  Social Connections: Not on file    Review of Systems: Gen: Denies fever, chills, anorexia. Denies fatigue, weakness, weight loss.  CV: Denies chest pain, palpitations, syncope, peripheral edema, and claudication. Resp: Denies dyspnea at rest, cough, wheezing, coughing up blood, and pleurisy. GI: see HPI Derm: Denies rash, itching, dry skin Psych: Denies depression, anxiety, memory loss, confusion. No homicidal or suicidal ideation.  Heme: Denies bruising, bleeding, and enlarged lymph nodes.  Physical Exam: BP (!) 82/58   Pulse (!) 57   Temp (!) 97.5 F (36.4 C) (Temporal)   Ht _0  (1.575 m)   Wt 216 lb (98 kg)   BMI 39.51 kg/m  General:   Alert and oriented. No distress noted. Pleasant and cooperative.  Head:  Normocephalic and atraumatic. Eyes:  Conjuctiva clear without scleral icterus. Mouth:  mask in  place Abdomen:  +BS, soft, non-tender and non-distended. No rebound or guarding. No HSM or masses noted. Msk:  Symmetrical without gross deformities. Normal posture. Extremities:  Without edema. Neurologic:  Alert and  oriented x4 Psych:  Alert and cooperative. Normal mood and affect.  ASSESSMENT: ZYAN MIRKIN is a 60 y.o. female presenting today with history of IDA in remote past now with recurrence, constipation, GERD, history of gastric bypass, history of adenomas in past with last colonoscopy in 2018 and last EGD in 2018. Returning today in follow-up.  IDA: with drifting Hgb down to 10 range, ferritin low normal and iron low. On oral iron. No overt GI bleeding. As she has had worsening from baseline, will pursue early interval colonoscopy/EGD. 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 She is on Xarelto, which will need to be held for 48 hours prior.   Constipation: not ideally managed. Increase Linzes from 145 mcg to 290 mcg.   GERD: continue omeprazole daily. s/p hemigastrectomy in 2018.    PLAN:  Proceed with colonoscopy /EGDby Dr. Gala Romney in near future with Propofol: the risks, benefits, and alternatives have been discussed with the patient in detail. The patient states understanding and desires to proceed.  Hold Xarelto 48 hours prior  Continue PPI  Increase Linzess to 290 mcg daily  Return in 4 months  Annitta Needs, PhD, [facility]

## 2020-10-24 NOTE — Patient Instructions (Signed)
For constipation: let's try the higher dosage of Linzess. I have provided samples. Stop Linzess 145 mcg and start the Linzess 290 mcg daily, 30 minutes before breakfast on an empty stomach. Let me know how this works, so we can send in samples!  We are arranging a colonoscopy and upper endoscopy due to anemia. Please stop iron 10 days before. You will need to stop Xarelto 48 hours before.  I will see you in 4 months!  I am glad you are doing better!  I enjoyed seeing you again today! As you know, I value our relationship and want to provide genuine, compassionate, and quality care. I welcome your feedback. If you receive a survey regarding your visit,  I greatly appreciate you taking time to fill this out. See you next time!  Annitta Needs, PhD, ANP-BC [facility] Gastroenter[facility]

## 2020-11-08 ENCOUNTER — Encounter: Payer: Self-pay | Admitting: *Deleted

## 2020-11-08 ENCOUNTER — Telehealth: Payer: Self-pay

## 2020-11-08 NOTE — Telephone Encounter (Signed)
Per encounter form, pt needs ok from Dr. Alba Destine to hold Xarelto for 2 days prior to TCS/EGD w/Propofol ASA 3 with Dr. Gala Romney.

## 2020-11-08 NOTE — Telephone Encounter (Signed)
Faxed request to hold Xarelto to Dr. Dorice Lamas office.  F: [phone]

## 2020-11-12 ENCOUNTER — Telehealth: Payer: Self-pay | Admitting: Internal Medicine

## 2020-11-12 NOTE — Telephone Encounter (Addendum)
Informed pt that we were trying to obtain permission to hold Xarelto from Dr. Alba Destine.  Will call pt once obtained.

## 2020-11-12 NOTE — Telephone Encounter (Signed)
Patient said someone from here called her.

## 2020-11-13 ENCOUNTER — Encounter: Payer: Self-pay | Admitting: *Deleted

## 2020-11-13 MED ORDER — PEG 3350-KCL-NA BICARB-NACL 420 G PO SOLR: ORAL | 0 refills | Status: DC

## 2020-11-13 NOTE — Addendum Note (Signed)
Addended by: Cheron Every on: 11/13/2020 03:41 PM   Modules accepted: Orders

## 2020-11-13 NOTE — Telephone Encounter (Signed)
Received fax permitting to hold xarelto 48 hours prior to procedure.  Routing to Roseanne Kaufman, NP as Jul[facility] Rainier and [facility] Clinical Pool.  Placed in Anna's box to review as well.

## 2020-11-13 NOTE — Telephone Encounter (Signed)
Called pt. She has been scheduled for TCS/EGD w/ propofol, asa 3 Dr. Gala Romney on 9/15 at 12:30pm. Aware will mail prep instructions with pre-op appt. Rx sent to pharmacy. Aware to hold iron 10 days prior and xarelto 2 days prior

## 2020-11-14 NOTE — Telephone Encounter (Signed)
PA approved via [facility]. Auth# [id number], DOS 12/26/2020-03/26/2021

## 2020-12-06 ENCOUNTER — Other Ambulatory Visit: Payer: Self-pay | Admitting: Gastroenterology

## 2020-12-19 NOTE — Patient Instructions (Signed)
Deborah Murray  12/19/2020     '@PREFPERIOPPHARMACY'$ @   Your procedure is scheduled on  12/26/2020.   Report to [facility] at  [place]  A.M.   Call this number if you have problems the morning of surgery:  [phone]   Remember:  Follow the diet and prep instructions given to you by the office.     Your last dose of iron should have been 12/15/2020 and your last dose of Xarelto should be 12/23/2020.     Take these medicines the morning of surgery with A SIP OF WATER       amlodipine, cymbalta, cortef, levothyroxine, prilosec, zofran (if neeed), oxycodone (if needed), lyrica     Do not wear jewelry, make-up or nail polish.  Do not wear lotions, powders, or perfumes, or deodorant.  Do not shave 48 hours prior to surgery.  Men may shave face and neck.  Do not bring valuables to the hospital.  [facility] is not responsible for any belongings or valuables.  Contacts, dentures or bridgework may not be worn into surgery.  Leave your suitcase in the car.  After surgery it may be brought to your room.  For patients admitted to the hospital, discharge time will be determined by your treatment team.  Patients discharged the day of surgery will not be allowed to drive home and must have someone with them for 24 hours.    Special instructions:   DO NOT smoke tobacco or vape for 24 hours before your procedure.  Please read over the following fact sheets that you were given. Anesthesia Post-op Instructions and Care and Recovery After Surgery      Upper Endoscopy, Adult, Care After This sheet gives you information about how to care for yourself after your procedure. Your health care provider may also give you more specific instructions. If you have problems or questions, contact your health care provider. What can I expect after the procedure? After the procedure, it is common to have: A sore throat. Mild stomach pain or discomfort. Bloating. Nausea. Follow these  instructions at home:  Follow instructions from your health care provider about what to eat or drink after your procedure. Return to your normal activities as told by your health care provider. Ask your health care provider what activities are safe for you. Take over-the-counter and prescription medicines only as told by your health care provider. If you were given a sedative during the procedure, it can affect you for several hours. Do not drive or operate machinery until your health care provider says that it is safe. Keep all follow-up visits as told by your health care provider. This is important. Contact a health care provider if you have: A sore throat that lasts longer than one day. Trouble swallowing. Get help right away if: You vomit blood or your vomit looks like coffee grounds. You have: A fever. Bloody, black, or tarry stools. A severe sore throat or you cannot swallow. Difficulty breathing. Severe pain in your chest or abdomen. Summary After the procedure, it is common to have a sore throat, mild stomach discomfort, bloating, and nausea. If you were given a sedative during the procedure, it can affect you for several hours. Do not drive or operate machinery until your health care provider says that it is safe. Follow instructions from your health care provider about what to eat or drink after your procedure. Return to your normal activities as told by your health  care provider. This information is not intended to replace advice given to you by your health care provider. Make sure you discuss any questions you have with your health care provider. Document Revised: 03/28/2019 Document Reviewed: 08/30/2017 Elsevier Patient Education  2022 [place]. Colonoscopy, Adult, Care After This sheet gives you information about how to care for yourself after your procedure. Your health care provider may also give you more specific instructions. If you have problems or questions,  contact your health care provider. What can I expect after the procedure? After the procedure, it is common to have: A small amount of blood in your stool for 24 hours after the procedure. Some gas. Mild cramping or bloating of your abdomen. Follow these instructions at home: Eating and drinking  Drink enough fluid to keep your urine pale yellow. Follow instructions from your health care provider about eating or drinking restrictions. Resume your normal diet as instructed by your health care provider. Avoid heavy or fried foods that are hard to digest. Activity Rest as told by your health care provider. Avoid sitting for a long time without moving. Get up to take short walks every 1-2 hours. This is important to improve blood flow and breathing. Ask for help if you feel weak or unsteady. Return to your normal activities as told by your health care provider. Ask your health care provider what activities are safe for you. Managing cramping and bloating  Try walking around when you have cramps or feel bloated. Apply heat to your abdomen as told by your health care provider. Use the heat source that your health care provider recommends, such as a moist heat pack or a heating pad. Place a towel between your skin and the heat source. Leave the heat on for 20-30 minutes. Remove the heat if your skin turns bright red. This is especially important if you are unable to feel pain, heat, or cold. You may have a greater risk of getting burned. General instructions If you were given a sedative during the procedure, it can affect you for several hours. Do not drive or operate machinery until your health care provider says that it is safe. For the first 24 hours after the procedure: Do not sign important documents. Do not drink alcohol. Do your regular daily activities at a slower pace than normal. Eat soft foods that are easy to digest. Take over-the-counter and prescription medicines only as told by  your health care provider. Keep all follow-up visits as told by your health care provider. This is important. Contact a health care provider if: You have blood in your stool 2-3 days after the procedure. Get help right away if you have: More than a small spotting of blood in your stool. Large blood clots in your stool. Swelling of your abdomen. Nausea or vomiting. A fever. Increasing pain in your abdomen that is not relieved with medicine. Summary After the procedure, it is common to have a small amount of blood in your stool. You may also have mild cramping and bloating of your abdomen. If you were given a sedative during the procedure, it can affect you for several hours. Do not drive or operate machinery until your health care provider says that it is safe. Get help right away if you have a lot of blood in your stool, nausea or vomiting, a fever, or increased pain in your abdomen. This information is not intended to replace advice given to you by your health care provider. Make sure you discuss  any questions you have with your health care provider. Document Revised: 03/24/2019 Document Reviewed: 10/24/2018 Elsevier Patient Education  [place] After This sheet gives you information about how to care for yourself after your procedure. Your health care provider may also give you more specific instructions. If you have problems or questions, contact your health care provider. What can I expect after the procedure? After the procedure, it is common to have: Tiredness. Forgetfulness about what happened after the procedure. Impaired judgment for important decisions. Nausea or vomiting. Some difficulty with balance. Follow these instructions at home: For the time period you were told by your health care provider:   Rest as needed. Do not participate in activities where you could fall or become injured. Do not drive or use machinery. Do not drink  alcohol. Do not take sleeping pills or medicines that cause drowsiness. Do not make important decisions or sign legal documents. Do not take care of children on your own. Eating and drinking Follow the diet that is recommended by your health care provider. Drink enough fluid to keep your urine pale yellow. If you vomit: Drink water, juice, or soup when you can drink without vomiting. Make sure you have little or no nausea before eating solid foods. General instructions Have a responsible adult stay with you for the time you are told. It is important to have someone help care for you until you are awake and alert. Take over-the-counter and prescription medicines only as told by your health care provider. If you have sleep apnea, surgery and certain medicines can increase your risk for breathing problems. Follow instructions from your health care provider about wearing your sleep device: Anytime you are sleeping, including during daytime naps. While taking prescription pain medicines, sleeping medicines, or medicines that make you drowsy. Avoid smoking. Keep all follow-up visits as told by your health care provider. This is important. Contact a health care provider if: You keep feeling nauseous or you keep vomiting. You feel light-headed. You are still sleepy or having trouble with balance after 24 hours. You develop a rash. You have a fever. You have redness or swelling around the IV site. Get help right away if: You have trouble breathing. You have new-onset confusion at home. Summary For several hours after your procedure, you may feel tired. You may also be forgetful and have poor judgment. Have a responsible adult stay with you for the time you are told. It is important to have someone help care for you until you are awake and alert. Rest as told. Do not drive or operate machinery. Do not drink alcohol or take sleeping pills. Get help right away if you have trouble breathing, or if  you suddenly become confused. This information is not intended to replace advice given to you by your health care provider. Make sure you discuss any questions you have with your health care provider. Document Revised: 12/14/2019 Document Reviewed: 03/02/2019 Elsevier Patient Education  2022 [employer].

## 2020-12-23 ENCOUNTER — Other Ambulatory Visit: Payer: Self-pay

## 2020-12-23 ENCOUNTER — Encounter (HOSPITAL_COMMUNITY)
Admission: RE | Admit: 2020-12-23 | Discharge: 2020-12-23 | Disposition: A | Discharge status: Home / Self Care | Payer: 59 | Source: Ambulatory Visit | Attending: Internal Medicine | Admitting: Internal Medicine

## 2020-12-23 ENCOUNTER — Encounter (HOSPITAL_COMMUNITY): Payer: Self-pay

## 2020-12-23 DIAGNOSIS — Z01818 Encounter for other preprocedural examination: Secondary | ICD-10-CM | POA: Diagnosis present

## 2020-12-23 HISTORY — DX: Sleep apnea, unspecified: G47.30

## 2020-12-23 LAB — CBC WITH DIFFERENTIAL/PLATELET
Abs Immature Granulocytes: 0.05 10*3/uL (ref 0.00–0.07)
Basophils Absolute: 0.1 10*3/uL (ref 0.0–0.1)
Basophils Relative: 2 %
Eosinophils Absolute: 0.1 10*3/uL (ref 0.0–0.5)
Eosinophils Relative: 3 %
HCT: 40.8 % (ref 36.0–46.0)
Hemoglobin: 12.5 g/dL (ref 12.0–15.0)
Immature Granulocytes: 1 %
Lymphocytes Relative: 29 %
Lymphs Abs: 1 10*3/uL (ref 0.7–4.0)
MCH: 28.7 pg (ref 26.0–34.0)
MCHC: 30.6 g/dL (ref 30.0–36.0)
MCV: 93.6 fL (ref 80.0–100.0)
Monocytes Absolute: 0.5 10*3/uL (ref 0.1–1.0)
Monocytes Relative: 14 %
Neutro Abs: 1.9 10*3/uL (ref 1.7–7.7)
Neutrophils Relative %: 51 %
Platelets: 252 10*3/uL (ref 150–400)
RBC: 4.36 MIL/uL (ref 3.87–5.11)
RDW: 18.6 % — ABNORMAL HIGH (ref 11.5–15.5)
WBC: 3.7 10*3/uL — ABNORMAL LOW (ref 4.0–10.5)
nRBC: 0 % (ref 0.0–0.2)

## 2020-12-24 ENCOUNTER — Telehealth: Payer: Self-pay | Admitting: Internal Medicine

## 2020-12-24 MED ORDER — PEG 3350-KCL-NA BICARB-NACL 420 G PO SOLR: 4000.0000 mL | ORAL | 0 refills | Status: DC

## 2020-12-24 NOTE — Addendum Note (Signed)
Addended by: Zara Council C on: 12/24/2020 10:02 AM   Modules accepted: Orders

## 2020-12-24 NOTE — Telephone Encounter (Signed)
Rx resent to pharmacy.  Tried to call pt, LMOVM to inform her rx was resent.

## 2020-12-24 NOTE — Telephone Encounter (Signed)
Please send patient prep to her pharmacy again, she never picked it up and they said they dont have it

## 2020-12-25 ENCOUNTER — Telehealth: Payer: Self-pay

## 2020-12-25 NOTE — Telephone Encounter (Signed)
Called pt to see if she can arrive earlier tomorrow for TCS/EGD. Pt will call her granddaughter to see if she can take her earlier and call back.  Pt later called office and said she can't get in touch with her granddaughter. Pt is interested in having procedure sooner. Advised her if she speaks to her granddaughter before 4:00pm and can move up to let us know.  Endo scheduler informed.

## 2020-12-26 ENCOUNTER — Other Ambulatory Visit: Payer: Self-pay

## 2020-12-26 ENCOUNTER — Encounter (HOSPITAL_COMMUNITY)
Admission: RE | Disposition: A | Discharge status: Home / Self Care | Payer: Self-pay | Source: Home / Self Care | Attending: Internal Medicine

## 2020-12-26 ENCOUNTER — Encounter (HOSPITAL_COMMUNITY): Payer: Self-pay | Admitting: Internal Medicine

## 2020-12-26 ENCOUNTER — Ambulatory Visit (HOSPITAL_COMMUNITY): Payer: 59 | Admitting: Anesthesiology

## 2020-12-26 ENCOUNTER — Ambulatory Visit (HOSPITAL_COMMUNITY)
Admission: RE | Admit: 2020-12-26 | Discharge: 2020-12-26 | Disposition: A | Discharge status: Home / Self Care | Payer: 59 | Attending: Internal Medicine | Admitting: Internal Medicine

## 2020-12-26 DIAGNOSIS — Z886 Allergy status to analgesic agent status: Secondary | ICD-10-CM | POA: Diagnosis not present

## 2020-12-26 DIAGNOSIS — Z6839 Body mass index (BMI) 39.0-39.9, adult: Secondary | ICD-10-CM | POA: Insufficient documentation

## 2020-12-26 DIAGNOSIS — Z8719 Personal history of other diseases of the digestive system: Secondary | ICD-10-CM | POA: Insufficient documentation

## 2020-12-26 DIAGNOSIS — Z885 Allergy status to narcotic agent status: Secondary | ICD-10-CM | POA: Insufficient documentation

## 2020-12-26 DIAGNOSIS — I4891 Unspecified atrial fibrillation: Secondary | ICD-10-CM | POA: Diagnosis not present

## 2020-12-26 DIAGNOSIS — J449 Chronic obstructive pulmonary disease, unspecified: Secondary | ICD-10-CM | POA: Diagnosis not present

## 2020-12-26 DIAGNOSIS — E785 Hyperlipidemia, unspecified: Secondary | ICD-10-CM | POA: Diagnosis not present

## 2020-12-26 DIAGNOSIS — Z8249 Family history of ischemic heart disease and other diseases of the circulatory system: Secondary | ICD-10-CM | POA: Insufficient documentation

## 2020-12-26 DIAGNOSIS — E039 Hypothyroidism, unspecified: Secondary | ICD-10-CM | POA: Insufficient documentation

## 2020-12-26 DIAGNOSIS — I10 Essential (primary) hypertension: Secondary | ICD-10-CM | POA: Diagnosis not present

## 2020-12-26 DIAGNOSIS — D509 Iron deficiency anemia, unspecified: Secondary | ICD-10-CM

## 2020-12-26 DIAGNOSIS — Z7951 Long term (current) use of inhaled steroids: Secondary | ICD-10-CM | POA: Insufficient documentation

## 2020-12-26 DIAGNOSIS — G4730 Sleep apnea, unspecified: Secondary | ICD-10-CM | POA: Diagnosis not present

## 2020-12-26 DIAGNOSIS — Z79890 Hormone replacement therapy (postmenopausal): Secondary | ICD-10-CM | POA: Diagnosis not present

## 2020-12-26 DIAGNOSIS — Z79899 Other long term (current) drug therapy: Secondary | ICD-10-CM | POA: Insufficient documentation

## 2020-12-26 DIAGNOSIS — Z888 Allergy status to other drugs, medicaments and biological substances status: Secondary | ICD-10-CM | POA: Diagnosis not present

## 2020-12-26 DIAGNOSIS — Z7901 Long term (current) use of anticoagulants: Secondary | ICD-10-CM | POA: Insufficient documentation

## 2020-12-26 DIAGNOSIS — R1310 Dysphagia, unspecified: Secondary | ICD-10-CM | POA: Diagnosis not present

## 2020-12-26 DIAGNOSIS — K219 Gastro-esophageal reflux disease without esophagitis: Secondary | ICD-10-CM | POA: Insufficient documentation

## 2020-12-26 DIAGNOSIS — Z9884 Bariatric surgery status: Secondary | ICD-10-CM | POA: Insufficient documentation

## 2020-12-26 DIAGNOSIS — Z90710 Acquired absence of both cervix and uterus: Secondary | ICD-10-CM | POA: Insufficient documentation

## 2020-12-26 DIAGNOSIS — Z9049 Acquired absence of other specified parts of digestive tract: Secondary | ICD-10-CM | POA: Diagnosis not present

## 2020-12-26 DIAGNOSIS — K5730 Diverticulosis of large intestine without perforation or abscess without bleeding: Secondary | ICD-10-CM | POA: Insufficient documentation

## 2020-12-26 DIAGNOSIS — Z880 Allergy status to penicillin: Secondary | ICD-10-CM | POA: Diagnosis not present

## 2020-12-26 DIAGNOSIS — Z7984 Long term (current) use of oral hypoglycemic drugs: Secondary | ICD-10-CM | POA: Insufficient documentation

## 2020-12-26 HISTORY — PX: ESOPHAGOGASTRODUODENOSCOPY (EGD) WITH PROPOFOL: SHX5813

## 2020-12-26 HISTORY — PX: COLONOSCOPY WITH PROPOFOL: SHX5780

## 2020-12-26 SURGERY — COLONOSCOPY WITH PROPOFOL
Anesthesia: General

## 2020-12-26 MED ORDER — LIDOCAINE HCL (PF) 2 % IJ SOLN
INTRAMUSCULAR | Status: AC
  Filled 2020-12-26: qty 5

## 2020-12-26 MED ORDER — LACTATED RINGERS IV SOLN: INTRAVENOUS | Status: DC

## 2020-12-26 MED ORDER — PROPOFOL 10 MG/ML IV BOLUS
INTRAVENOUS | Status: DC | PRN
  Administered 2020-12-26: 80 mg via INTRAVENOUS
  Administered 2020-12-26: 50 mg via INTRAVENOUS
  Administered 2020-12-26 (×2): 40 mg via INTRAVENOUS
  Administered 2020-12-26 (×3): 50 mg via INTRAVENOUS
  Administered 2020-12-26: 40 mg via INTRAVENOUS

## 2020-12-26 MED ORDER — PROPOFOL 10 MG/ML IV BOLUS
INTRAVENOUS | Status: AC
  Filled 2020-12-26: qty 40

## 2020-12-26 MED ORDER — LIDOCAINE HCL (CARDIAC) PF 100 MG/5ML IV SOSY
PREFILLED_SYRINGE | INTRAVENOUS | Status: DC | PRN
  Administered 2020-12-26: 100 mg via INTRAVENOUS

## 2020-12-26 MED ORDER — STERILE WATER FOR IRRIGATION IR SOLN
Status: DC | PRN
  Administered 2020-12-26: 1.5 mL

## 2020-12-26 MED ORDER — PROPOFOL 10 MG/ML IV BOLUS
INTRAVENOUS | Status: AC
  Filled 2020-12-26: qty 20

## 2020-12-26 NOTE — H&P (Signed)
@LOGO @   Primary Care Physician:  Sol Passer, MD Primary Gastroenterologist:  Dr. Gala Romney  Pre-Procedure History & Physical: HPI:  Deborah Murray is a 60 y.o. female here for further evaluation of longstanding GERD and iron deficiency anemia.  History of colonic polyps.  He is here for an EGD and a colonoscopy.  History of prior hemigastrectomy.   History gastric bypass surgery for morbid obesity.  Lost 100 pounds.  Has esophageal dysphagia to solids.  Xarelto held 2 days ago per plan.  Past Medical History:  Diagnosis Date   Adrenal abnormality ([place])    Adrenal insufficiency ([place]) 2013   Tx at [facility]   Anemia    Arthritis    Asthma    Atrial fibrillation (HCC)    Chronic back pain    COPD (chronic obstructive pulmonary disease) (HCC)    GERD (gastroesophageal reflux disease)    Glaucoma    Headache    Hyperlipidemia    Hypertension    Hypothyroidism    Lumbar radiculopathy    Sepsis ([place]) 11/04/2015   Sleep apnea    Syncope and collapse 05/11/11  adrenal insuffiency  treated at [facility]   Thyroid disease    Vocal cord dysfunction     Past Surgical History:  Procedure Laterality Date   ABDOMINAL HYSTERECTOMY     BACK SURGERY     disc    CARDIAC CATHETERIZATION  2012 at [place] WITH PERCUTANEOUS PINNING Left 03/26/2016   Procedure: [place];  Surgeon: Leanora Cover, MD;  Location: [place];  Service: Orthopedics;  Laterality: Left;   COLONOSCOPY  2010   [place]: anal papilla, otherwise normal    COLONOSCOPY WITH PROPOFOL N/A 03/03/2017   sigmoid divertivcula, 4 mm polyp in rectum (tubular adenoma), non-bleeding internal hemorrhoids, surveillance in 7 years   ESOPHAGOGASTRODUODENOSCOPY N/A 09/17/2015   Surgicall altered stomach. NO specimens collected. normal esophagus   ESOPHAGOGASTRODUODENOSCOPY (EGD) WITH PROPOFOL N/A  03/03/2017   normal esophagus   eye surg for glaucoma     gastic by-pass  2008   Gastric by-pass in 2008 at [place] 01/26/2019   Procedure: REPAIR OF SUPRAUMBILICAL INCISIONAL HERNIA WITH MESH;  Surgeon: Erroll Luna, MD;  Location: [place];  Service: General;  Laterality: N/A;   right knee surg     arthroscopy   SHOULDER SURGERY Bilateral     Prior to Admission medications   Medication Sig Start Date End Date Taking? Authorizing Provider  acetaminophen (TYLENOL) 500 MG tablet Take 1,000 mg every 6 (six) hours as needed by mouth (for pain.).   Yes [provider]  albuterol (PROVENTIL HFA;VENTOLIN HFA) 108 (90 BASE) MCG/ACT inhaler Inhale 2 puffs into the lungs every 4 (four) hours as needed for wheezing.    Yes [provider]  apixaban (ELIQUIS) 5 MG TABS tablet Take 5 mg by mouth 2 (two) times daily.   Yes [provider]  atorvastatin (LIPITOR) 20 MG tablet Take 20 mg by mouth every evening.    Yes [provider]  brimonidine (ALPHAGAN) 0.2 % ophthalmic solution Place 1 drop into both eyes 2 (two) times daily.   Yes [provider]  Calcium Carbonate-Vitamin D (CALTRATE 600+D PO) Take 1 tablet by mouth daily.   Yes [provider]  cetirizine (ZYRTEC) 5 MG tablet Take 5 mg by  mouth daily.   Yes [provider]  cholecalciferol (VITAMIN D3) 25 MCG (1000 UNIT) tablet Take 1,000 Units by mouth daily.   Yes [provider]  DULoxetine (CYMBALTA) 30 MG capsule Take 30 mg by mouth daily. 11/07/18  Yes [provider]  ferrous sulfate 325 (65 FE) MG tablet Take 325 mg by mouth daily with breakfast.   Yes [provider]  hydrocortisone (CORTEF) 10 MG tablet Take 5-10 mg by mouth See admin instructions. Take 1 tablet (10 mg) by mouth in the morning & take 0.5 tablet (5 mg) by mouth in the afternoon. 12/01/18  Yes [provider]  levocetirizine (XYZAL) 5 MG tablet  Take 5 mg every evening by mouth.   Yes [provider]  levothyroxine (SYNTHROID) 50 MCG tablet Take 50 mcg by mouth daily before breakfast. 12/01/18  Yes [provider]  LINZESS 145 MCG CAPS capsule TAKE ONE CAPSULE BY MOUTH DAILY BEFORE BREAKFAST. Patient taking differently: Take 145 mcg by mouth daily as needed (constipation). 09/03/20  Yes Annitta Needs, NP  lisinopril (ZESTRIL) 10 MG tablet Take 10 mg by mouth daily.   Yes [provider]  Melatonin 5 MG TABS Take 5 mg by mouth at bedtime.   Yes [provider]  montelukast (SINGULAIR) 10 MG tablet Take 10 mg by mouth at bedtime.   Yes [provider]  Multiple Vitamins-Minerals (BARIATRIC MULTIVITAMINS/IRON PO) Take 1 tablet by mouth daily.   Yes [provider]  omalizumab Arvid Right) 150 MG injection Inject 300 mg into the skin every 28 (twenty-eight) days. 10/30/19  Yes [provider]  omeprazole (PRILOSEC) 20 MG capsule Take 1 capsule (20 mg total) by mouth daily. 30 minutes before breakfast. 04/25/18  Yes Annitta Needs, NP  ondansetron (ZOFRAN-ODT) 4 MG disintegrating tablet TAKE 1 TABLET BY MOUTH EVERY 8 HOURS AS NEEDED. 12/10/20  Yes Annitta Needs, NP  Oxycodone HCl 10 MG TABS Take 15 mg by mouth every 6 (six) hours as needed. 10/09/19  Yes [provider]  polyethylene glycol (MIRALAX / GLYCOLAX) 17 g packet Take 17 g by mouth daily as needed for moderate constipation.   Yes [provider]  polyethylene glycol-electrolytes (NULYTELY) 420 g solution As directed 11/13/20  Yes Lashala Laser, Cristopher Estimable, MD  polyethylene glycol-electrolytes (TRILYTE) 420 g solution Take 4,000 mLs by mouth as directed. 12/24/20  Yes Davin Muramoto, Cristopher Estimable, MD  potassium chloride SA (KLOR-CON) 20 MEQ tablet Take 20 mEq by mouth daily.   Yes [provider]  pregabalin (LYRICA) 100 MG capsule Take 100 mg by mouth 2 (two) times daily. 11/19/18  Yes [provider]  SYMBICORT 80-4.5 MCG/ACT  inhaler Inhale 2 puffs into the lungs 2 (two) times daily. 08/19/18  Yes [provider]  traZODone (DESYREL) 50 MG tablet Take 50 mg by mouth at bedtime.   Yes [provider]  VOLTAREN 1 % GEL Apply 1 application 4 (four) times daily topically.  06/01/16  Yes [provider]  ciprofloxacin (CIPRO) 750 MG tablet Take 750 mg by mouth daily. 11/26/20   [provider]  flunisolide (NASALIDE) 25 MCG/ACT (0.025%) SOLN Place 2 sprays into the nose daily as needed for allergies. 08/12/18   [provider]  metFORMIN (GLUCOPHAGE-XR) 500 MG 24 hr tablet Take 500 mg by mouth daily with breakfast.    [provider]  NARCAN 4 MG/0.1ML LIQD nasal spray kit Place 1 spray into the nose once. 09/14/19   [provider]    Allergies as of 11/13/2020 - Review Complete 10/24/2020  Allergen Reaction Noted   Aspirin Shortness Of Breath and Palpitations 05/11/2011   Codeine sulfate Shortness Of Breath and Palpitations 05/11/2011   Darvocet [propoxyphene n-acetaminophen] Shortness Of Breath and Palpitations 05/11/2011   Nsaids  02/15/2013   Tramadol Shortness Of Breath 10/30/2014   Gabapentin Other (See Comments) 02/02/2017   Penicillins Other (See Comments) 05/11/2011    Family History  Problem Relation Age of Onset   Heart attack Mother    Hypertension Mother    Heart attack Father    Asthma Father    Hyperlipidemia Father    Hypertension Father    Heart attack Sister    Arrhythmia Sister    Asthma Sister    Hyperlipidemia Sister    Hypertension Sister    Asthma Brother    Hypertension Brother    Colon cancer Neg Hx     Social History   Socioeconomic History   Marital status: Single    Spouse name: Not on file   Number of children: Not on file   Years of education: Not on file   Highest education level: Not on file  Occupational History   Occupation: disability   Tobacco Use   Smoking status: Never   Smokeless tobacco: Never   Vaping Use   Vaping Use: Never used  Substance and Sexual Activity   Alcohol use: No   Drug use: No   Sexual activity: Never  Other Topics Concern   Not on file  Social History Narrative   Not on file   Social Determinants of Health   Financial Resource Strain: Not on file  Food Insecurity: Not on file  Transportation Needs: Not on file  Physical Activity: Not on file  Stress: Not on file  Social Connections: Not on file  Intimate Partner Violence: Not on file    Review of Systems: See HPI, otherwise negative ROS  Physical Exam: BP (!) 145/94   Pulse (!) 57   Temp 97.9 F (36.6 C) (Oral)   Resp 18   SpO2 99%  General:   Alert,  Well-developed, well-nourished, pleasant and cooperative in NAD Neck:  Supple; no masses or thyromegaly. No significant cervical adenopathy. Lungs:  Clear throughout to auscultation.   No wheezes, crackles, or rhonchi. No acute distress. Heart:  Regular rate and rhythm; no murmurs, clicks, rubs,  or gallops. Abdomen: Non-distended, normal bowel sounds.  Soft and nontender without appreciable mass or hepatosplenomegaly.  Pulses:  Normal pulses noted. Extremities:  Without clubbing or edema.  Impression/Plan: 60 year old lady here for further evaluation of iron deficient anemia, esophageal dysphagia history colon via EGD and colonoscopy.  Potential for esophageal dilation reviewed today.  Xarelto held 2 days ago per plan.  The risks, benefits, limitations, imponderables and alternatives regarding both EGD and colonoscopy have been reviewed with the patient. Questions have been answered. All parties agreeable.      Notice: This dictation was prepared with Dragon dictation along with smaller phrase technology. Any transcriptional errors that result from this process are unintentional and may not be corrected upon review.

## 2020-12-26 NOTE — Op Note (Signed)
[facility] Patient Name: Deborah Murray Procedure Date: 12/26/2020 11:55 AM MRN: [id number] Date of Birth: 02/14/1961 Attending MD: Norvel Richards , MD CSN: [id number] Age: 60 Admit Type: Outpatient Procedure:                Upper GI endoscopy Indications:              Iron deficiency anemia, Dysphagia Providers:                Norvel Richards, MD, Charlsie Quest. Theda Sers RN, RN,                            Raphael Gibney, Technician Referring MD:              Medicines:                Propofol per Anesthesia Complications:            No immediate complications. Estimated Blood Loss:     Estimated blood loss was minimal. Procedure:                After obtaining informed consent, the endoscope was                            passed under direct vision. Throughout the                            procedure, the patient's blood pressure, pulse, and                            oxygen saturations were monitored continuously. The                            [id number] scope was introduced through the                            mouth, and advanced into the efferent small bowel                            limb. Scope In: 12:58:14 PM Scope Out: 1:04:15 PM Total Procedure Duration: 0 hours 6 minutes 1 second  Findings:      The examined esophagus was normal. Surgically altered stomach with small       gastric pouch. Single patent apparent small bowel limb. Intubated a good       25 cm. Mucosa of the residual stomach and small bowel appeared entirely       normal. The scope was withdrawn. Dilation was performed with a Maloney       dilator with mild resistance at 26 Fr. The dilation site was examined       following endoscope reinsertion and showed no change. Estimated blood       loss was minimal. Impression:               - Normal esophagus. Dilated. 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  Residual gastric mucosa and proximal small bowel                            appeared normal                           - No specimens collected.                           -Cannot exclude an occult cervical esophageal web                            dilated today Moderate Sedation:      Moderate (conscious) sedation was personally administered by an       anesthesia professional. The following parameters were monitored: oxygen       saturation, heart rate, blood pressure, respiratory rate, EKG, adequacy       of pulmonary ventilation, and response to care. Recommendation:           - Patient has a contact number available for                            emergencies. The signs and symptoms of potential                            delayed complications were discussed with the                            patient. Return to normal activities tomorrow.                            Written discharge instructions were provided to the                            patient.                           - Resume previous diet.                           - Continue present medications. Continue omeprazole                            daily. See colonoscopy report.                           - Return to my office (date not yet determined). Procedure Code(s):        --- Professional ---                           [phone], Esophagogastroduodenoscopy, flexible,                            transoral; diagnostic, including collection of                            specimen(s) by brushing or washing, when  performed                            (separate procedure)                           43450, Dilation of esophagus, by unguided sound or                            bougie, single or multiple passes Diagnosis Code(s):        --- Professional ---                           D50.9, Iron deficiency anemia, unspecified                           R13.10, Dysphagia, unspecified CPT copyright 2019 American Medical Association. All rights  reserved. The codes documented in this report are preliminary and upon coder review may  be revised to meet current compliance requirements. Cristopher Estimable. Tomi Grandpre, MD Norvel Richards, MD 12/26/2020 1:10:18 PM This report has been signed electronically. Number of Addenda: 0

## 2020-12-26 NOTE — Op Note (Signed)
[facility] Patient Name: Deborah Murray Procedure Date: 12/26/2020 1:10 PM MRN: [id number] Date of Birth: 05-14-1960 Attending MD: Norvel Richards , MD CSN: [id number] Age: 60 Admit Type: Outpatient Procedure:                Colonoscopy Indications:              Iron deficiency anemia Providers:                Norvel Richards, MD, Charlsie Quest. Theda Sers RN, RN,                            Raphael Gibney, Technician Referring MD:              Medicines:                Propofol per Anesthesia Complications:            No immediate complications. Estimated Blood Loss:     Estimated blood loss: none. Procedure:                Pre-Anesthesia Assessment:                           - Prior to the procedure, a History and Physical                            was performed, and patient medications and                            allergies were reviewed. The patient's tolerance of                            previous anesthesia was also reviewed. The risks                            and benefits of the procedure and the sedation                            options and risks were discussed with the patient.                            All questions were answered, and informed consent                            was obtained. Prior Anticoagulants: The patient                            last took Eliquis (apixaban) 3 days prior to the                            procedure. ASA Grade Assessment: III - A patient                            with severe systemic disease. After reviewing the  risks and benefits, the patient was deemed in                            satisfactory condition to undergo the procedure.                           After obtaining informed consent, the colonoscope                            was passed under direct vision. Throughout the                            procedure, the patient's blood pressure, pulse, and                            oxygen  saturations were monitored continuously. The                            [id number]) scope was introduced through                            the anus and advanced to the the cecum, identified                            by appendiceal orifice and ileocecal valve. The                            colonoscopy was performed without difficulty. The                            patient tolerated the procedure well. The quality                            of the bowel preparation was adequate. Scope In: 1:11:29 PM Scope Out: 1:23:45 PM Scope Withdrawal Time: 0 hours 6 minutes 55 seconds  Total Procedure Duration: 0 hours 12 minutes 16 seconds  Findings:      Scattered medium-mouthed diverticula were found in the sigmoid colon and       descending colon.      The exam was otherwise without abnormality on direct and retroflexion       views. Impression:               - Diverticulosis in the sigmoid colon and in the                            descending colon.                           - The examination was otherwise normal on direct                            and retroflexion views.                           - No specimens collected. Moderate Sedation:  Moderate (conscious) sedation was personally administered by an       anesthesia professional. The following parameters were monitored: oxygen       saturation, heart rate, blood pressure, respiratory rate, EKG, adequacy       of pulmonary ventilation, and response to care. Recommendation:           - Patient has a contact number available for                            emergencies. The signs and symptoms of potential                            delayed complications were discussed with the                            patient. Return to normal activities tomorrow.                            Written discharge instructions were provided to the                            patient.                           - Resume previous diet.                            - Continue present medications.                           - Repeat colonoscopy in 7 years for screening                            purposes.                           - Return to GI office in 3 months. See EGD report.                            Resume Xarelto today. Procedure Code(s):        --- Professional ---                           [phone], Colonoscopy, flexible; diagnostic, including                            collection of specimen(s) by brushing or washing,                            when performed (separate procedure) Diagnosis Code(s):        --- Professional ---                           D50.9, Iron deficiency anemia, unspecified                           K57.30, Diverticulosis  of large intestine without                            perforation or abscess without bleeding CPT copyright 2019 American Medical Association. All rights reserved. The codes documented in this report are preliminary and upon coder review may  be revised to meet current compliance requirements. Cristopher Estimable. Tyyne Cliett, MD Norvel Richards, MD 12/26/2020 1:28:05 PM This report has been signed electronically. Number of Addenda: 0

## 2020-12-26 NOTE — Anesthesia Postprocedure Evaluation (Signed)
Anesthesia Post Note  Patient: Deborah Murray  Procedure(s) Performed: COLONOSCOPY WITH PROPOFOL ESOPHAGOGASTRODUODENOSCOPY (EGD)/ POSSIBLE ESOPHAGEAL DILATION WITH PROPOFOL  Patient location during evaluation: Phase II Anesthesia Type: General Level of consciousness: awake and alert and oriented Pain management: pain level controlled Vital Signs Assessment: post-procedure vital signs reviewed and stable Respiratory status: spontaneous breathing and respiratory function stable Cardiovascular status: blood pressure returned to baseline and stable Postop Assessment: no apparent nausea or vomiting Anesthetic complications: no   No notable events documented.   Last Vitals:  Vitals:   12/26/20 1114 12/26/20 1327  BP: (!) 145/94   Pulse: (!) 57 72  Resp: 18 15  Temp: 36.6 C 36.7 C  SpO2: 99%     Last Pain:  Vitals:   12/26/20 1327  TempSrc: Oral  PainSc: 0-No pain                 Cassandre Oleksy C Jarvis Sawa

## 2020-12-26 NOTE — Discharge Instructions (Addendum)
Colonoscopy Discharge Instructions  Read the instructions outlined below and refer to this sheet in the next few weeks. These discharge instructions provide you with general information on caring for yourself after you leave the hospital. Your doctor may also give you specific instructions. While your treatment has been planned according to the most current medical practices available, unavoidable complications occasionally occur. If you have any problems or questions after discharge, call Dr. Gala Romney at [phone]. ACTIVITY You may resume your regular activity, but move at a slower pace for the next 24 hours.  Take frequent rest periods for the next 24 hours.  Walking will help get rid of the air and reduce the bloated feeling in your belly (abdomen).  No driving for 24 hours (because of the medicine (anesthesia) used during the test).   Do not sign any important legal documents or operate any machinery for 24 hours (because of the anesthesia used during the test).  NUTRITION Drink plenty of fluids.  You may resume your normal diet as instructed by your doctor.  Begin with a light meal and progress to your normal diet. Heavy or fried foods are harder to digest and may make you feel sick to your stomach (nauseated).  Avoid alcoholic beverages for 24 hours or as instructed.  MEDICATIONS You may resume your normal medications unless your doctor tells you otherwise.  WHAT YOU CAN EXPECT TODAY Some feelings of bloating in the abdomen.  Passage of more gas than usual.  Spotting of blood in your stool or on the toilet paper.  IF YOU HAD POLYPS REMOVED DURING THE COLONOSCOPY: No aspirin products for 7 days or as instructed.  No alcohol for 7 days or as instructed.  Eat a soft diet for the next 24 hours.  FINDING OUT THE RESULTS OF YOUR TEST Not all test results are available during your visit. If your test results are not back during the visit, make an appointment with your caregiver to find out the  results. Do not assume everything is normal if you have not heard from your caregiver or the medical facility. It is important for you to follow up on all of your test results.  SEEK IMMEDIATE MEDICAL ATTENTION IF: You have more than a spotting of blood in your stool.  Your belly is swollen (abdominal distention).  You are nauseated or vomiting.  You have a temperature over 101.  You have abdominal pain or discomfort that is severe or gets worse throughout the day.   EGD Discharge instructions Please read the instructions outlined below and refer to this sheet in the next few weeks. These discharge instructions provide you with general information on caring for yourself after you leave the hospital. Your doctor may also give you specific instructions. While your treatment has been planned according to the most current medical practices available, unavoidable complications occasionally occur. If you have any problems or questions after discharge, please call your doctor. ACTIVITY You may resume your regular activity but move at a slower pace for the next 24 hours.  Take frequent rest periods for the next 24 hours.  Walking will help expel (get rid of) the air and reduce the bloated feeling in your abdomen.  No driving for 24 hours (because of the anesthesia (medicine) used during the test).  You may shower.  Do not sign any important legal documents or operate any machinery for 24 hours (because of the anesthesia used during the test).  NUTRITION Drink plenty of fluids.  You may  resume your normal diet.  Begin with a light meal and progress to your normal diet.  Avoid alcoholic beverages for 24 hours or as instructed by your caregiver.  MEDICATIONS You may resume your normal medications unless your caregiver tells you otherwise.  WHAT YOU CAN EXPECT TODAY You may experience abdominal discomfort such as a feeling of fullness or "gas" pains.  FOLLOW-UP Your doctor will discuss the results of  your test with you.  SEEK IMMEDIATE MEDICAL ATTENTION IF ANY OF THE FOLLOWING OCCUR: Excessive nausea (feeling sick to your stomach) and/or vomiting.  Severe abdominal pain and distention (swelling).  Trouble swallowing.  Temperature over 101 F (37.8 C).  Rectal bleeding or vomiting of blood.     Your esophagus was stretched today  Diverticulosis and GERD information provided  No polyps in your colon today  I recommend a repeat colonoscopy for surveillance purposes in 7 years  Office visit with [place] in 3 months.  Resume Eliquis today  At patient request, I called Hateya Gehling at [phone] -rolled to voicemail.  I left a message.

## 2020-12-26 NOTE — Anesthesia Preprocedure Evaluation (Signed)
Anesthesia Evaluation  Patient identified by MRN, date of birth, ID band Patient awake    Reviewed: Allergy & Precautions, NPO status , Patient's Chart, lab work & pertinent test results  Airway Mallampati: II  TM Distance: >3 FB Neck ROM: Full    Dental  (+) Dental Advisory Given, Chipped, Missing   Pulmonary asthma , sleep apnea , COPD,  COPD inhaler,    Pulmonary exam normal breath sounds clear to auscultation       Cardiovascular Exercise Tolerance: Good hypertension, Pt. on medications Normal cardiovascular exam+ dysrhythmias (on eliquis ) Atrial Fibrillation  Rhythm:Regular Rate:Normal  23-Dec-2020 11:33:49 [facility] ROUTINE RECORD 22-Mar-1961 (64 yr) Female Black Vent. rate 68 BPM PR interval 126 ms QRS duration 80 ms QT/QTcB 398/423 ms P-R-T axes 66 -14 60 Normal sinus rhythm Low voltage QRS Cannot rule out Anterior infarct , age undetermined Abnormal ECG No significant change since last tracing Confirmed by Daneen Schick [phone]) on 12/24/2020 9:31:21 PM   Neuro/Psych  Headaches,  Neuromuscular disease negative psych ROS   GI/Hepatic PUD, Bowel prep,GERD  Medicated,Gastric bypass   Endo/Other  diabetes, Well Controlled, Type 2, Oral Hypoglycemic AgentsHypothyroidism   Renal/GU Renal disease (AKI)     Musculoskeletal  (+) Arthritis , Osteoarthritis,  Back pain   Abdominal   Peds  Hematology  (+) anemia ,   Anesthesia Other Findings Adrenal insuffiencey  Vocal cord dysfunction   Reproductive/Obstetrics                             Anesthesia Physical Anesthesia Plan  ASA: 3  Anesthesia Plan: General   Post-op Pain Management:    Induction:   PONV Risk Score and Plan: Propofol infusion  Airway Management Planned: Nasal Cannula, Natural Airway and Simple Face Mask  Additional Equipment:   Intra-op Plan:   Post-operative Plan:   Informed  Consent: I have reviewed the patients History and Physical, chart, labs and discussed the procedure including the risks, benefits and alternatives for the proposed anesthesia with the patient or authorized representative who has indicated his/her understanding and acceptance.     Dental advisory given  Plan Discussed with: CRNA and Surgeon  Anesthesia Plan Comments:         Anesthesia Quick Evaluation

## 2020-12-26 NOTE — Transfer of Care (Signed)
Immediate Anesthesia Transfer of Care Note  Patient: Deborah Murray  Procedure(s) Performed: COLONOSCOPY WITH PROPOFOL ESOPHAGOGASTRODUODENOSCOPY (EGD)/ POSSIBLE ESOPHAGEAL DILATION WITH PROPOFOL  Patient Location: Short Stay  Anesthesia Type:General  Level of Consciousness: awake, alert  and oriented  Airway & Oxygen Therapy: Patient Spontanous Breathing  Post-op Assessment: Report given to RN and Post -op Vital signs reviewed and stable  Post vital signs: Reviewed and stable  Last Vitals:  Vitals Value Taken Time  BP    Temp    Pulse 71 12/26/20  1330  Resp 16 12/26/20  1330  SpO2 97 12/26/20  1330    Last Pain:  Vitals:   12/26/20 1245  TempSrc:   PainSc: 0-No pain      Patients Stated Pain Goal: 8 ([id number])  Complications: No notable events documented.

## 2021-01-03 ENCOUNTER — Encounter (HOSPITAL_COMMUNITY): Payer: Self-pay | Admitting: Internal Medicine

## 2021-02-20 ENCOUNTER — Observation Stay (HOSPITAL_COMMUNITY)
Admission: EM | Admit: 2021-02-20 | Discharge: 2021-02-21 | Disposition: A | Discharge status: Home / Self Care | Payer: 59 | Attending: Internal Medicine | Admitting: Internal Medicine

## 2021-02-20 ENCOUNTER — Emergency Department (HOSPITAL_COMMUNITY): Payer: 59

## 2021-02-20 ENCOUNTER — Other Ambulatory Visit: Payer: Self-pay

## 2021-02-20 DIAGNOSIS — G4733 Obstructive sleep apnea (adult) (pediatric): Secondary | ICD-10-CM | POA: Diagnosis not present

## 2021-02-20 DIAGNOSIS — N179 Acute kidney failure, unspecified: Secondary | ICD-10-CM | POA: Insufficient documentation

## 2021-02-20 DIAGNOSIS — E039 Hypothyroidism, unspecified: Secondary | ICD-10-CM | POA: Insufficient documentation

## 2021-02-20 DIAGNOSIS — Z79899 Other long term (current) drug therapy: Secondary | ICD-10-CM | POA: Diagnosis not present

## 2021-02-20 DIAGNOSIS — G459 Transient cerebral ischemic attack, unspecified: Principal | ICD-10-CM | POA: Insufficient documentation

## 2021-02-20 DIAGNOSIS — R2681 Unsteadiness on feet: Secondary | ICD-10-CM | POA: Insufficient documentation

## 2021-02-20 DIAGNOSIS — Z7984 Long term (current) use of oral hypoglycemic drugs: Secondary | ICD-10-CM | POA: Diagnosis not present

## 2021-02-20 DIAGNOSIS — I9589 Other hypotension: Secondary | ICD-10-CM

## 2021-02-20 DIAGNOSIS — E119 Type 2 diabetes mellitus without complications: Secondary | ICD-10-CM | POA: Insufficient documentation

## 2021-02-20 DIAGNOSIS — Z7901 Long term (current) use of anticoagulants: Secondary | ICD-10-CM | POA: Diagnosis not present

## 2021-02-20 DIAGNOSIS — I10 Essential (primary) hypertension: Secondary | ICD-10-CM | POA: Insufficient documentation

## 2021-02-20 DIAGNOSIS — I959 Hypotension, unspecified: Secondary | ICD-10-CM | POA: Insufficient documentation

## 2021-02-20 DIAGNOSIS — Z6841 Body Mass Index (BMI) 40.0 and over, adult: Secondary | ICD-10-CM | POA: Diagnosis not present

## 2021-02-20 DIAGNOSIS — I482 Chronic atrial fibrillation, unspecified: Secondary | ICD-10-CM | POA: Diagnosis not present

## 2021-02-20 DIAGNOSIS — I4891 Unspecified atrial fibrillation: Secondary | ICD-10-CM | POA: Diagnosis present

## 2021-02-20 DIAGNOSIS — R531 Weakness: Secondary | ICD-10-CM | POA: Diagnosis not present

## 2021-02-20 DIAGNOSIS — R519 Headache, unspecified: Secondary | ICD-10-CM

## 2021-02-20 DIAGNOSIS — Z20822 Contact with and (suspected) exposure to covid-19: Secondary | ICD-10-CM | POA: Insufficient documentation

## 2021-02-20 DIAGNOSIS — R200 Anesthesia of skin: Secondary | ICD-10-CM

## 2021-02-20 LAB — I-STAT CHEM 8, ED
BUN: 28 mg/dL — ABNORMAL HIGH (ref 8–23)
Calcium, Ion: 1.1 mmol/L — ABNORMAL LOW (ref 1.15–1.40)
Chloride: 108 mmol/L (ref 98–111)
Creatinine, Ser: 2 mg/dL — ABNORMAL HIGH (ref 0.44–1.00)
Glucose, Bld: 79 mg/dL (ref 70–99)
HCT: 34 % — ABNORMAL LOW (ref 36.0–46.0)
Hemoglobin: 11.6 g/dL — ABNORMAL LOW (ref 12.0–15.0)
Potassium: 3.9 mmol/L (ref 3.5–5.1)
Sodium: 140 mmol/L (ref 135–145)
TCO2: 24 mmol/L (ref 22–32)

## 2021-02-20 LAB — CBC
HCT: 36.1 % (ref 36.0–46.0)
Hemoglobin: 11.2 g/dL — ABNORMAL LOW (ref 12.0–15.0)
MCH: 29.6 pg (ref 26.0–34.0)
MCHC: 31 g/dL (ref 30.0–36.0)
MCV: 95.3 fL (ref 80.0–100.0)
Platelets: 221 10*3/uL (ref 150–400)
RBC: 3.79 MIL/uL — ABNORMAL LOW (ref 3.87–5.11)
RDW: 16.2 % — ABNORMAL HIGH (ref 11.5–15.5)
WBC: 6.6 10*3/uL (ref 4.0–10.5)
nRBC: 0 % (ref 0.0–0.2)

## 2021-02-20 LAB — COMPREHENSIVE METABOLIC PANEL
ALT: 36 U/L (ref 0–44)
AST: 31 U/L (ref 15–41)
Albumin: 3.3 g/dL — ABNORMAL LOW (ref 3.5–5.0)
Alkaline Phosphatase: 69 U/L (ref 38–126)
Anion gap: 7 (ref 5–15)
BUN: 31 mg/dL — ABNORMAL HIGH (ref 8–23)
CO2: 23 mmol/L (ref 22–32)
Calcium: 8 mg/dL — ABNORMAL LOW (ref 8.9–10.3)
Chloride: 109 mmol/L (ref 98–111)
Creatinine, Ser: 1.87 mg/dL — ABNORMAL HIGH (ref 0.44–1.00)
GFR, Estimated: 30 mL/min — ABNORMAL LOW (ref >60)
Glucose, Bld: 85 mg/dL (ref 70–99)
Potassium: 3.9 mmol/L (ref 3.5–5.1)
Sodium: 139 mmol/L (ref 135–145)
Total Bilirubin: 0.5 mg/dL (ref 0.3–1.2)
Total Protein: 5.6 g/dL — ABNORMAL LOW (ref 6.5–8.1)

## 2021-02-20 LAB — APTT: aPTT: 32 seconds (ref 24–36)

## 2021-02-20 LAB — ETHANOL: Alcohol, Ethyl (B): <10 mg/dL (ref <10)

## 2021-02-20 LAB — URINALYSIS, ROUTINE W REFLEX MICROSCOPIC
Bilirubin Urine: NEGATIVE
Glucose, UA: NEGATIVE mg/dL
Hgb urine dipstick: NEGATIVE
Ketones, ur: NEGATIVE mg/dL
Leukocytes,Ua: NEGATIVE
Nitrite: NEGATIVE
Protein, ur: NEGATIVE mg/dL
Specific Gravity, Urine: 1.006 (ref 1.005–1.030)
pH: 5 (ref 5.0–8.0)

## 2021-02-20 LAB — PROTIME-INR
INR: 1.1 (ref 0.8–1.2)
Prothrombin Time: 14.4 seconds (ref 11.4–15.2)

## 2021-02-20 LAB — DIFFERENTIAL
Abs Immature Granulocytes: 0.07 10*3/uL (ref 0.00–0.07)
Basophils Absolute: 0.1 10*3/uL (ref 0.0–0.1)
Basophils Relative: 2 %
Eosinophils Absolute: 0.4 10*3/uL (ref 0.0–0.5)
Eosinophils Relative: 6 %
Immature Granulocytes: 1 %
Lymphocytes Relative: 19 %
Lymphs Abs: 1.2 10*3/uL (ref 0.7–4.0)
Monocytes Absolute: 0.9 10*3/uL (ref 0.1–1.0)
Monocytes Relative: 14 %
Neutro Abs: 3.9 10*3/uL (ref 1.7–7.7)
Neutrophils Relative %: 58 %

## 2021-02-20 LAB — CBG MONITORING, ED: Glucose-Capillary: 87 mg/dL (ref 70–99)

## 2021-02-20 LAB — GLUCOSE, CAPILLARY: Glucose-Capillary: 85 mg/dL (ref 70–99)

## 2021-02-20 LAB — RAPID URINE DRUG SCREEN, HOSP PERFORMED
Amphetamines: NOT DETECTED
Barbiturates: NOT DETECTED
Benzodiazepines: NOT DETECTED
Cocaine: NOT DETECTED
Opiates: POSITIVE — AB
Tetrahydrocannabinol: NOT DETECTED

## 2021-02-20 LAB — RESP PANEL BY RT-PCR (FLU A&B, COVID) ARPGX2
Influenza A by PCR: NEGATIVE
Influenza B by PCR: NEGATIVE
SARS Coronavirus 2 by RT PCR: NEGATIVE

## 2021-02-20 MED ORDER — HYDROCORTISONE 5 MG PO TABS
5.0000 mg | ORAL_TABLET | Freq: Every day | ORAL | Status: DC
  Filled 2021-02-20 (×3): qty 1

## 2021-02-20 MED ORDER — INSULIN ASPART 100 UNIT/ML IJ SOLN: 0.0000 [IU] | Freq: Every day | INTRAMUSCULAR | Status: DC

## 2021-02-20 MED ORDER — APIXABAN 5 MG PO TABS
5.0000 mg | ORAL_TABLET | Freq: Two times a day (BID) | ORAL | Status: DC
  Administered 2021-02-20 – 2021-02-21 (×2): 5 mg via ORAL
  Filled 2021-02-20 (×2): qty 1

## 2021-02-20 MED ORDER — STROKE: EARLY STAGES OF RECOVERY BOOK
Freq: Once | Status: AC
  Filled 2021-02-20: qty 1

## 2021-02-20 MED ORDER — ALBUTEROL SULFATE HFA 108 (90 BASE) MCG/ACT IN AERS: 2.0000 | INHALATION_SPRAY | RESPIRATORY_TRACT | Status: DC | PRN

## 2021-02-20 MED ORDER — HYDROCORTISONE 10 MG PO TABS
10.0000 mg | ORAL_TABLET | Freq: Every day | ORAL | Status: DC
  Administered 2021-02-21: 10 mg via ORAL
  Filled 2021-02-20 (×4): qty 1

## 2021-02-20 MED ORDER — ATORVASTATIN CALCIUM 20 MG PO TABS
20.0000 mg | ORAL_TABLET | Freq: Every evening | ORAL | Status: DC
  Administered 2021-02-20: 20 mg via ORAL
  Filled 2021-02-20: qty 1

## 2021-02-20 MED ORDER — TRAZODONE HCL 50 MG PO TABS
50.0000 mg | ORAL_TABLET | Freq: Every day | ORAL | Status: DC
  Administered 2021-02-20: 50 mg via ORAL
  Filled 2021-02-20: qty 1

## 2021-02-20 MED ORDER — OMALIZUMAB 150 MG ~~LOC~~ SOLR: 300.0000 mg | SUBCUTANEOUS | Status: DC

## 2021-02-20 MED ORDER — PEG 3350-KCL-NA BICARB-NACL 420 G PO SOLR
4000.0000 mL | ORAL | Status: DC
Start: 2021-02-20 — End: 2021-02-21

## 2021-02-20 MED ORDER — ACETAMINOPHEN 325 MG PO TABS: 650.0000 mg | ORAL_TABLET | ORAL | Status: DC | PRN

## 2021-02-20 MED ORDER — SODIUM CHLORIDE 0.9 % IV BOLUS
1000.0000 mL | Freq: Once | INTRAVENOUS | Status: AC
  Administered 2021-02-20: 1000 mL via INTRAVENOUS

## 2021-02-20 MED ORDER — BRIMONIDINE TARTRATE 0.2 % OP SOLN
1.0000 [drp] | Freq: Two times a day (BID) | OPHTHALMIC | Status: DC
  Administered 2021-02-20 – 2021-02-21 (×2): 1 [drp] via OPHTHALMIC
  Filled 2021-02-20: qty 5

## 2021-02-20 MED ORDER — HYDROCORTISONE SOD SUC (PF) 100 MG IJ SOLR
100.0000 mg | Freq: Once | INTRAMUSCULAR | Status: AC
  Administered 2021-02-20: 100 mg via INTRAVENOUS
  Filled 2021-02-20: qty 2

## 2021-02-20 MED ORDER — POLYETHYLENE GLYCOL 3350 17 G PO PACK: 17.0000 g | PACK | Freq: Every day | ORAL | Status: DC | PRN

## 2021-02-20 MED ORDER — FENTANYL CITRATE PF 50 MCG/ML IJ SOSY
50.0000 ug | PREFILLED_SYRINGE | Freq: Once | INTRAMUSCULAR | Status: AC
  Administered 2021-02-20: 50 ug via INTRAVENOUS
  Filled 2021-02-20: qty 1

## 2021-02-20 MED ORDER — SODIUM CHLORIDE 0.9 % IV SOLN: INTRAVENOUS | Status: DC

## 2021-02-20 MED ORDER — POTASSIUM CHLORIDE CRYS ER 20 MEQ PO TBCR
20.0000 meq | EXTENDED_RELEASE_TABLET | Freq: Every day | ORAL | Status: DC
  Administered 2021-02-21: 20 meq via ORAL
  Filled 2021-02-20: qty 1

## 2021-02-20 MED ORDER — ACETAMINOPHEN 160 MG/5ML PO SOLN: 650.0000 mg | ORAL | Status: DC | PRN

## 2021-02-20 MED ORDER — LEVOCETIRIZINE DIHYDROCHLORIDE 5 MG PO TABS: 5.0000 mg | ORAL_TABLET | Freq: Every evening | ORAL | Status: DC

## 2021-02-20 MED ORDER — NALOXONE HCL 4 MG/0.1ML NA LIQD: 1.0000 | Freq: Once | NASAL | Status: DC

## 2021-02-20 MED ORDER — MONTELUKAST SODIUM 10 MG PO TABS
10.0000 mg | ORAL_TABLET | Freq: Every day | ORAL | Status: DC
  Administered 2021-02-20: 10 mg via ORAL
  Filled 2021-02-20: qty 1

## 2021-02-20 MED ORDER — LORATADINE 10 MG PO TABS
10.0000 mg | ORAL_TABLET | Freq: Every day | ORAL | Status: DC
  Administered 2021-02-21: 10 mg via ORAL
  Filled 2021-02-20 (×2): qty 1

## 2021-02-20 MED ORDER — MELATONIN 3 MG PO TABS
3.0000 mg | ORAL_TABLET | Freq: Every day | ORAL | Status: DC
  Administered 2021-02-20: 3 mg via ORAL
  Filled 2021-02-20: qty 1

## 2021-02-20 MED ORDER — VITAMIN D 25 MCG (1000 UNIT) PO TABS
1000.0000 [IU] | ORAL_TABLET | Freq: Every day | ORAL | Status: DC
  Administered 2021-02-21: 1000 [IU] via ORAL
  Filled 2021-02-20: qty 1

## 2021-02-20 MED ORDER — LEVOTHYROXINE SODIUM 50 MCG PO TABS
50.0000 ug | ORAL_TABLET | Freq: Every day | ORAL | Status: DC
  Administered 2021-02-21: 50 ug via ORAL
  Filled 2021-02-20: qty 1

## 2021-02-20 MED ORDER — FERROUS SULFATE 325 (65 FE) MG PO TABS
325.0000 mg | ORAL_TABLET | Freq: Every day | ORAL | Status: DC
  Administered 2021-02-21: 325 mg via ORAL
  Filled 2021-02-20: qty 1

## 2021-02-20 MED ORDER — PANTOPRAZOLE SODIUM 40 MG PO TBEC
40.0000 mg | DELAYED_RELEASE_TABLET | Freq: Every day | ORAL | Status: DC
  Administered 2021-02-21: 40 mg via ORAL
  Filled 2021-02-20: qty 1

## 2021-02-20 MED ORDER — MOMETASONE FURO-FORMOTEROL FUM 100-5 MCG/ACT IN AERO
2.0000 | INHALATION_SPRAY | Freq: Two times a day (BID) | RESPIRATORY_TRACT | Status: DC
  Administered 2021-02-21: 2 via RESPIRATORY_TRACT
  Filled 2021-02-20: qty 8.8

## 2021-02-20 MED ORDER — ACETAMINOPHEN 650 MG RE SUPP: 650.0000 mg | RECTAL | Status: DC | PRN

## 2021-02-20 MED ORDER — ALBUTEROL SULFATE (2.5 MG/3ML) 0.083% IN NEBU: 2.5000 mg | INHALATION_SOLUTION | RESPIRATORY_TRACT | Status: DC | PRN

## 2021-02-20 MED ORDER — ADULT MULTIVITAMIN W/MINERALS CH
1.0000 | ORAL_TABLET | Freq: Every day | ORAL | Status: DC
  Administered 2021-02-21: 1 via ORAL
  Filled 2021-02-20: qty 1

## 2021-02-20 MED ORDER — INSULIN ASPART 100 UNIT/ML IJ SOLN
0.0000 [IU] | Freq: Three times a day (TID) | INTRAMUSCULAR | Status: DC
Start: 2021-02-21 — End: 2021-02-21

## 2021-02-20 NOTE — Consult Note (Signed)
NEUROLOGY TELECONSULTATION NOTE   Date of service: February 20, 2021 Patient Name: Deborah Murray MRN:  [id number] DOB:  1961-02-10 Reason for consult: telestroke  Requesting Provider: Dr. Noemi Chapel Consult Participants: myself, patient, bedside RN, telestroke RN Location of the provider: [place], [place] Location of the patient: [facility]  This consult was provided via telemedicine with 2-way video and audio communication. The patient/family was informed that care would be provided in this way and agreed to receive care in this manner.   _ _ _   _ __   _ __ _ _  __ __   _ __   __ _  History of Present Illness    This is a 60 yo patient with hx a fib on eliquis (last dose this AM), COPD, remote hx syncope, remote hx adrenal insufficiency who [facility] to [facility] with headache, dizziness, and left-sided weakness since 11AM today. On arrival BP was 80/60 and increased to 101/70 during the stroke code after 500cc NS. On examination NIHSS = 4 for non-reproducible R eye upper outer quadrantopia, L NLF flattening, LLE drift, and L sided sensory deficit. CT head NAICP ASPECTS 10. Patient was not a TNK candidate 2/2 presentation outside the window and contraindication of being on eliquis. Exam was not c/w LVO therefore advanced imaging was not performed as part of the stroke code.   ROS   Per HPI; all other systems reviewed and are negative  Past History   The following was personally reviewed:  Past Medical History:  Diagnosis Date   Adrenal abnormality ([place])    Adrenal insufficiency ([place]) 2013   Tx at [facility]   Anemia    Arthritis    Asthma    Atrial fibrillation ([place])    Chronic back pain    COPD (chronic obstructive pulmonary disease) (H[facility])    GERD (gastroesophageal reflux disease)    Glaucoma    Headache    Hyperlipidemia    Hypertension    Hypothyroidism    Lumbar radiculopathy    Sepsis ([place]) 11/04/2015   Sleep apnea    Syncope and collapse 05/11/11  adrenal insuffiency   treated at [facility]   Thyroid disease    Vocal cord dysfunction    Past Surgical History:  Procedure Laterality Date   ABDOMINAL HYSTERECTOMY     BACK SURGERY     disc    CARDIAC CATHETERIZATION  2012 at [place] WITH PERCUTANEOUS PINNING Left 03/26/2016   Procedure: [place];  Surgeon: Leanora Cover, MD;  Location: [place];  Service: Orthopedics;  Laterality: Left;   COLONOSCOPY  2010   [place]: anal papilla, otherwise normal    COLONOSCOPY WITH PROPOFOL N/A 03/03/2017   sigmoid divertivcula, 4 mm polyp in rectum (tubular adenoma), non-bleeding internal hemorrhoids, surveillance in 7 years   COLONOSCOPY WITH PROPOFOL N/A 12/26/2020   Procedure: COLONOSCOPY WITH PROPOFOL;  Surgeon: Daneil Dolin, MD;  [facility];  Service: Endoscopy;  Laterality: N/A;  12:30pm   ESOPHAGOGASTRODUODENOSCOPY N/A 09/17/2015   Surgicall altered stomach. NO specimens collected. normal esophagus   ESOPHAGOGASTRODUODENOSCOPY (EGD) WITH PROPOFOL N/A 03/03/2017   normal esophagus   ESOPHAGOGASTRODUODENOSCOPY (EGD) WITH PROPOFOL N/A 12/26/2020   Procedure: ESOPHAGOGASTRODUODENOSCOPY (EGD)/ POSSIBLE ESOPHAGEAL DILATION WITH PROPOFOL;  Surgeon: Daneil Dolin, MD;  [facility];  Service: Endoscopy;  Laterality: N/A;   eye surg  for glaucoma     gastic by-pass  2008   Gastric by-pass in 2008 at [place] N/A 01/26/2019   Procedure: REPAIR OF SUPRAUMBILICAL INCISIONAL HERNIA WITH MESH;  Surgeon: Erroll Luna, MD;  Location: [place];  Service: General;  Laterality: N/A;   right knee surg     arthroscopy   SHOULDER SURGERY Bilateral    Family History  Problem Relation Age of Onset   Heart attack Mother    Hypertension Mother    Heart attack Father    Asthma Father    Hyperlipidemia Father    Hypertension Father     Heart attack Sister    Arrhythmia Sister    Asthma Sister    Hyperlipidemia Sister    Hypertension Sister    Asthma Brother    Hypertension Brother    Colon cancer Neg Hx    Social History   Socioeconomic History   Marital status: Single    Spouse name: Not on file   Number of children: Not on file   Years of education: Not on file   Highest education level: Not on file  Occupational History   Occupation: disability   Tobacco Use   Smoking status: Never   Smokeless tobacco: Never  Vaping Use   Vaping Use: Never used  Substance and Sexual Activity   Alcohol use: No   Drug use: No   Sexual activity: Never  Other Topics Concern   Not on file  Social History Narrative   Not on file   Social Determinants of Health   Financial Resource Strain: Not on file  Food Insecurity: Not on file  Transportation Needs: Not on file  Physical Activity: Not on file  Stress: Not on file  Social Connections: Not on file   Allergies  Allergen Reactions   Aspirin Shortness Of Breath and Palpitations   Codeine Sulfate Shortness Of Breath and Palpitations   Darvocet [Propoxyphene N-Acetaminophen] Shortness Of Breath and Palpitations   Nsaids     Exacerbation of asthma   Tramadol Shortness Of Breath   Gabapentin Other (See Comments)    dizziness   Penicillins Other (See Comments)    Has patient had a PCN reaction causing immediate rash, facial/tongue/throat swelling, SOB or lightheadedness with hypotension: No Has patient had a PCN reaction causing severe rash involving mucus membranes or skin necrosis: No Has patient had a PCN reaction that required hospitalization No Has patient had a PCN reaction occurring within the last 10 years: Yes If all of the above answers are "NO", then may proceed with Cephalosporin use.     Medications   (Not in a hospital admission)     Current Facility-Administered Medications:    sodium chloride 0.9 % bolus 1,000 mL, 1,000 mL, Intravenous,  Once, Derek Jack, MD  Current Outpatient Medications:    acetaminophen (TYLENOL) 500 MG tablet, Take 1,000 mg every 6 (six) hours as needed by mouth (for pain.)., Disp: , Rfl:    albuterol (PROVENTIL HFA;VENTOLIN HFA) 108 (90 BASE) MCG/ACT inhaler, Inhale 2 puffs into the lungs every 4 (four) hours as needed for wheezing. , Disp: , Rfl:    apixaban (ELIQUIS) 5 MG TABS tablet, Take 5 mg by mouth 2 (two) times daily., Disp: , Rfl:    atorvastatin (LIPITOR) 20 MG tablet, Take 20 mg by mouth every evening. , Disp: , Rfl:    brimonidine (ALPHAGAN) 0.2 % ophthalmic solution, Place 1 drop into both eyes 2 (two)  times daily., Disp: , Rfl:    Calcium Carbonate-Vitamin D (CALTRATE 600+D PO), Take 1 tablet by mouth daily., Disp: , Rfl:    cetirizine (ZYRTEC) 5 MG tablet, Take 5 mg by mouth daily., Disp: , Rfl:    cholecalciferol (VITAMIN D3) 25 MCG (1000 UNIT) tablet, Take 1,000 Units by mouth daily., Disp: , Rfl:    ciprofloxacin (CIPRO) 750 MG tablet, Take 750 mg by mouth daily., Disp: , Rfl:    DULoxetine (CYMBALTA) 30 MG capsule, Take 30 mg by mouth daily., Disp: , Rfl:    ferrous sulfate 325 (65 FE) MG tablet, Take 325 mg by mouth daily with breakfast., Disp: , Rfl:    flunisolide (NASALIDE) 25 MCG/ACT (0.025%) SOLN, Place 2 sprays into the nose daily as needed for allergies., Disp: , Rfl:    hydrocortisone (CORTEF) 10 MG tablet, Take 5-10 mg by mouth See admin instructions. Take 1 tablet (10 mg) by mouth in the morning & take 0.5 tablet (5 mg) by mouth in the afternoon., Disp: , Rfl:    levocetirizine (XYZAL) 5 MG tablet, Take 5 mg every evening by mouth., Disp: , Rfl:    levothyroxine (SYNTHROID) 50 MCG tablet, Take 50 mcg by mouth daily before breakfast., Disp: , Rfl:    LINZESS 145 MCG CAPS capsule, TAKE ONE CAPSULE BY MOUTH DAILY BEFORE BREAKFAST. (Patient taking differently: Take 145 mcg by mouth daily as needed (constipation).), Disp: 90 capsule, Rfl: 3   lisinopril (ZESTRIL) 10 MG tablet,  Take 10 mg by mouth daily., Disp: , Rfl:    Melatonin 5 MG TABS, Take 5 mg by mouth at bedtime., Disp: , Rfl:    metFORMIN (GLUCOPHAGE-XR) 500 MG 24 hr tablet, Take 500 mg by mouth daily with breakfast., Disp: , Rfl:    montelukast (SINGULAIR) 10 MG tablet, Take 10 mg by mouth at bedtime., Disp: , Rfl:    Multiple Vitamins-Minerals (BARIATRIC MULTIVITAMINS/IRON PO), Take 1 tablet by mouth daily., Disp: , Rfl:    NARCAN 4 MG/0.1ML LIQD nasal spray kit, Place 1 spray into the nose once., Disp: , Rfl:    omalizumab (XOLAIR) 150 MG injection, Inject 300 mg into the skin every 28 (twenty-eight) days., Disp: , Rfl:    omeprazole (PRILOSEC) 20 MG capsule, Take 1 capsule (20 mg total) by mouth daily. 30 minutes before breakfast., Disp: 90 capsule, Rfl: 3   ondansetron (ZOFRAN-ODT) 4 MG disintegrating tablet, TAKE 1 TABLET BY MOUTH EVERY 8 HOURS AS NEEDED., Disp: 60 tablet, Rfl: 3   Oxycodone HCl 10 MG TABS, Take 15 mg by mouth every 6 (six) hours as needed., Disp: , Rfl:    polyethylene glycol (MIRALAX / GLYCOLAX) 17 g packet, Take 17 g by mouth daily as needed for moderate constipation., Disp: , Rfl:    polyethylene glycol-electrolytes (NULYTELY) 420 g solution, As directed, Disp: 4000 mL, Rfl: 0   polyethylene glycol-electrolytes (TRILYTE) 420 g solution, Take 4,000 mLs by mouth as directed., Disp: 4000 mL, Rfl: 0   potassium chloride SA (KLOR-CON) 20 MEQ tablet, Take 20 mEq by mouth daily., Disp: , Rfl:    pregabalin (LYRICA) 100 MG capsule, Take 100 mg by mouth 2 (two) times daily., Disp: , Rfl:    SYMBICORT 80-4.5 MCG/ACT inhaler, Inhale 2 puffs into the lungs 2 (two) times daily., Disp: , Rfl:    traZODone (DESYREL) 50 MG tablet, Take 50 mg by mouth at bedtime., Disp: , Rfl:    VOLTAREN 1 % GEL, Apply 1 application 4 (four) times daily  topically. , Disp: , Rfl:   Vitals   Vitals:   02/20/21 1730 02/20/21 1739 02/20/21 1751 02/20/21 1800  BP: (!) 85/63 94/76 101/70   Pulse: (!) 55 (!) 51 (!) 54    Resp: (!) 34 17 (!) 22   Temp:      TempSrc:      SpO2: 100% 100% 99%   Weight:    102.1 kg  Height:    _0  (1.575 m)     Body mass index is 41.15 kg/m.  Physical Exam   Exam performed over telemedicine with 2-way video and audio communication and with assistance of bedside RN  Physical Exam Gen: A&O x4, NAD Resp: normal WOB CV: extremities appear well-perfused  Neuro: *MS: A&O x4. Follows multi-step commands.  *Speech: nondysarthric, no aphasia, able to name and repeat *CN: PERRL 62m, EOMI, poorly reproducible upper outer quadrantopia in R eye only, sensation impaired L V2, L NLF flattening, hearing intact to voice *Motor:   Normal bulk.  No tremor, rigidity or bradykinesia. No pronator drift BUE. Drift but not to bed LLE, all other extremities appear full-strength. *Sensory: LUE and LLE sensory impairment to LT relative to R side. No double-simultaneous extinction.  *Coordination:  Finger-to-nose, heel-to-shin, rapid alternating motions were intact. *Reflexes:  UTA 2/2 tele-exam *Gait: deferred  NIHSS = 4 for non-reproducible R eye upper outer quadrantopia, L NLF flattening, LLE drift, and L sided sensory deficit.    Premorbid mRS = 1   Labs   CBC:  Recent Labs  Lab 02/20/21 1737 02/20/21 1743  WBC 6.6  --   NEUTROABS 3.9  --   HGB 11.2* 11.6*  HCT 36.1 34.0*  MCV 95.3  --   PLT 221  --     Basic Metabolic Panel:  Lab Results  Component Value Date   NA 140 02/20/2021   K 3.9 02/20/2021   CO2 22 07/18/2019   GLUCOSE 79 02/20/2021   BUN 28 (H) 02/20/2021   CREATININE 2.00 (H) 02/20/2021   CALCIUM 9.3 07/18/2019   GFRNONAA >60 07/18/2019   GFRAA >60 07/18/2019   Lipid Panel:  Lab Results  Component Value Date   LDLCALC 80 05/26/2019   HgbA1c: No results found for: HGBA1C Urine Drug Screen: No results found for: LABOPIA, COCAINSCRNUR, LABBENZ, AMPHETMU, THCU, LABBARB  Alcohol Level No results found for: ETH   Impression   This is a 60yo  patient with hx a fib on eliquis (last dose this AM), COPD, remote hx syncope, remote hx adrenal insufficiency who [facility] to [facility] with headache, dizziness, and left-sided weakness since 11AM today. She was markedly hypotensive on arrival 80/60 which is improving with fluids. I suspect she either had a small stroke in the setting of hypotension or she is hypoperfusing 2/2 asymmetric carotid stenosis in the setting of hypotension. She has AKI creatinine 2.0 therefore will perform MRI brain and MRA H&N in ED to clarify this.   Recommendations   - STAT MRI brain wo contrast and MRA H&N wo contrast ordered to be performed in ED - MRI findings will determine whether it is safe to continue eliquis for a fib. Hold evening dose for now. - Fluid resuscitation for goal SBP >130 - Permissive HTN x48 hrs from sx onset or until stroke ruled out by MRI goal BP <220/110. PRN labetalol or hydralazine if BP above these parameters. Avoid oral antihypertensives. - I will touch base with Dr. MSabra Heckafter her imaging results. If MRI brain is  negative for stroke patient will still require admission for AKI and TIA workup. - EKG and UA/Ucx in ED  Stroke/TIA workup after admission will additionally involve - TTE  - Check A1c and LDL + add statin per guidelines - q4 hr neuro checks - STAT head CT for any change in neuro exam - Tele - PT/OT/SLP - Stroke education - Teleneurology f/u with Dr. Hortense Ramal tomorrow - Amb referral to neurology upon discharge   I will f/u on MRI/MRA results and discuss further recommendations with Dr. Sabra Heck. ______________________________________________________________________   Thank you for the opportunity to take part in the care of this patient. If you have any further questions, please contact the neurology consultation attending.  Signed,  Su Monks, MD [facility] [phone]  If 7pm- 7am, please page neurology on call as listed in [place].

## 2021-02-20 NOTE — ED Notes (Signed)
Transported to MRI with fluids

## 2021-02-20 NOTE — ED Notes (Addendum)
Code Stroke paged out. Teleneurology paged at this time. Pt transported to CT.

## 2021-02-20 NOTE — H&P (Signed)
TRH H&P   Patient Demographics:    Deborah Murray, is a 60 y.o. female  MRN: [id number]   DOB - February 20, 1961  Admit Date - 02/20/2021  Outpatient Primary MD for the patient is Sol Passer, MD  Referring MD/NP/PA: Dr Sabra Heck  Patient coming from: Home  Chief Complaint  Patient presents with   Headache      HPI:    Deborah Murray  is a 60 y.o. female, with past medical history of COPD, A. fib on Eliquis, and Sufficiency, she presents to ED secondary to complaints of headache, dizziness, left-sided weakness, symptoms started at 11 AM today, upon arrival to ED she was noted to be hypotensive blood pressure 80/60, increased to 101/70 during the stroke code after IV fluid resuscitation, she was initially noted on stroke code evaluation by telemetry neurology to have right upper quadrant quadrantanopsia,L NLF flattening, LLE drift, and L sided sensory deficit, MRI brain with no acute CVA, MRA head/neck with no LVO, she denies fever, chills, reports she is compliant with her medications. - in ED her work-up significant for MRI brain with no acute CVA, MRA head/neck with no significant LVO labs significant for AKI with a creatinine of 2 [facility] hospitalist consulted to admit.    Review of systems:    In addition to the HPI above, she reports left-sided weakness. No Fever-chills, Reports headache, dizziness. No problems swallowing food or Liquids, No Chest pain, Cough or Shortness of Breath, No Abdominal pain, No Nausea or Vommitting, Bowel movements are regular, No Blood in stool or Urine, No dysuria, No new skin rashes or bruises, No new joints pains-aches,  No new weakness, tingling, numbness in any extremity, No recent weight gain or loss, No polyuria, polydypsia or polyphagia, No significant Mental Stressors.  A full 10 point Review of Systems was done, except as stated  above, all other Review of Systems were negative.   With Past History of the following :    Past Medical History:  Diagnosis Date   Adrenal abnormality ([place])    Adrenal insufficiency ([place]) 2013   Tx at [facility]   Anemia    Arthritis    Asthma    Atrial fibrillation (HCC)    Chronic back pain    COPD (chronic obstructive pulmonary disease) (HCC)    GERD (gastroesophageal reflux disease)    Glaucoma    Headache    Hyperlipidemia    Hypertension    Hypothyroidism    Lumbar radiculopathy    Sepsis ([place]) 11/04/2015   Sleep apnea    Syncope and collapse 05/11/11  adrenal insuffiency  treated at [facility]   Thyroid disease    Vocal cord dysfunction       Past Surgical History:  Procedure Laterality Date   ABDOMINAL HYSTERECTOMY     BACK SURGERY     disc    CARDIAC CATHETERIZATION  2012 at  [facility]   CHOLECYSTECTOMY     CHOLECYSTECTOMY, LAPAROSCOPIC     CLOSED REDUCTION METACARPAL WITH PERCUTANEOUS PINNING Left 03/26/2016   Procedure: CLOSED REDUCTION METACARPAL WITH PERCUTANEOUS PINNING;  Surgeon: Leanora Cover, MD;  Location: [place];  Service: Orthopedics;  Laterality: Left;   COLONOSCOPY  2010   [place]: anal papilla, otherwise normal    COLONOSCOPY WITH PROPOFOL N/A 03/03/2017   sigmoid divertivcula, 4 mm polyp in rectum (tubular adenoma), non-bleeding internal hemorrhoids, surveillance in 7 years   COLONOSCOPY WITH PROPOFOL N/A 12/26/2020   Procedure: COLONOSCOPY WITH PROPOFOL;  Surgeon: Daneil Dolin, MD;  [facility];  Service: Endoscopy;  Laterality: N/A;  12:30pm   ESOPHAGOGASTRODUODENOSCOPY N/A 09/17/2015   Surgicall altered stomach. NO specimens collected. 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 2008 at [place] N/A 01/26/2019   Procedure: REPAIR OF SUPRAUMBILICAL INCISIONAL HERNIA WITH MESH;  Surgeon: Erroll Luna, MD;  Location: [place];  Service: General;  Laterality: N/A;   right knee surg     arthroscopy   SHOULDER SURGERY Bilateral       Social History:     Social History   Tobacco Use   Smoking status: Never   Smokeless tobacco: Never  Substance Use Topics   Alcohol use: No       Family History :     Family History  Problem Relation Age of Onset   Heart attack Mother    Hypertension Mother    Heart attack Father    Asthma Father    Hyperlipidemia Father    Hypertension Father    Heart attack Sister    Arrhythmia Sister    Asthma Sister    Hyperlipidemia Sister    Hypertension Sister    Asthma Brother    Hypertension Brother    Colon cancer Neg Hx      Home Medications:   Prior to Admission medications   Medication Sig Start Date End Date Taking? Authorizing Provider  acetaminophen (TYLENOL) 500 MG tablet Take 1,000 mg every 6 (six) hours as needed by mouth (for pain.).    [provider]  albuterol (PROVENTIL HFA;VENTOLIN HFA) 108 (90 BASE) MCG/ACT inhaler Inhale 2 puffs into the lungs every 4 (four) hours as needed for wheezing.     [provider]  apixaban (ELIQUIS) 5 MG TABS tablet Take 5 mg by mouth 2 (two) times daily.    [provider]  atorvastatin (LIPITOR) 20 MG tablet Take 20 mg by mouth every evening.     [provider]  brimonidine (ALPHAGAN) 0.2 % ophthalmic solution Place 1 drop into both eyes 2 (two) times daily.    [provider]  Calcium Carbonate-Vitamin D (CALTRATE 600+D PO) Take 1 tablet by mouth daily.    [provider]  cetirizine (ZYRTEC) 5 MG tablet Take 5 mg by mouth daily.    [provider]   cholecalciferol (VITAMIN D3) 25 MCG (1000 UNIT) tablet Take 1,000 Units by mouth daily.    [provider]  ciprofloxacin (CIPRO) 750 MG tablet Take 750 mg by mouth daily.  11/26/20   [provider]  DULoxetine (CYMBALTA) 30 MG capsule Take 30 mg by mouth daily. 11/07/18   [provider]  ferrous sulfate 325 (65 FE) MG tablet Take 325 mg by mouth daily with breakfast.    [provider]  flunisolide (NASALIDE) 25 MCG/ACT (0.025%) SOLN Place 2 sprays into the nose daily as needed for allergies. 08/12/18   [provider]  hydrocortisone (CORTEF) 10 MG tablet Take 5-10 mg by mouth See admin instructions. Take 1 tablet (10 mg) by mouth in the morning & take 0.5 tablet (5 mg) by mouth in the afternoon. 12/01/18   [provider]  levocetirizine (XYZAL) 5 MG tablet Take 5 mg every evening by mouth.    [provider]  levothyroxine (SYNTHROID) 50 MCG tablet Take 50 mcg by mouth daily before breakfast. 12/01/18   [provider]  LINZESS 145 MCG CAPS capsule TAKE ONE CAPSULE BY MOUTH DAILY BEFORE BREAKFAST. Patient taking differently: Take 145 mcg by mouth daily as needed (constipation). 09/03/20   Annitta Needs, NP  lisinopril (ZESTRIL) 10 MG tablet Take 10 mg by mouth daily.    [provider]  Melatonin 5 MG TABS Take 5 mg by mouth at bedtime.    [provider]  metFORMIN (GLUCOPHAGE-XR) 500 MG 24 hr tablet Take 500 mg by mouth daily with breakfast.    [provider]  montelukast (SINGULAIR) 10 MG tablet Take 10 mg by mouth at bedtime.    [provider]  Multiple Vitamins-Minerals (BARIATRIC MULTIVITAMINS/IRON PO) Take 1 tablet by mouth daily.    [provider]  NARCAN 4 MG/0.1ML LIQD nasal spray kit Place 1 spray into the nose once. 09/14/19   [provider]  omalizumab Arvid Right) 150 MG injection Inject 300 mg into the skin every 28 (twenty-eight) days. 10/30/19   [provider]  omeprazole (PRILOSEC) 20 MG capsule Take 1 capsule (20 mg total) by mouth daily. 30 minutes before breakfast. 04/25/18   Annitta Needs, NP  ondansetron (ZOFRAN-ODT) 4 MG disintegrating tablet TAKE 1 TABLET BY MOUTH EVERY 8 HOURS AS NEEDED. 12/10/20   Annitta Needs, NP  Oxycodone HCl 10 MG TABS Take 15 mg by mouth every 6 (six) hours as needed. 10/09/19   [provider]  polyethylene glycol (MIRALAX / GLYCOLAX) 17 g packet Take 17 g by mouth daily as needed for moderate constipation.    [provider]  polyethylene glycol-electrolytes (NULYTELY) 420 g solution As directed 11/13/20   Rourk, Cristopher Estimable, MD  polyethylene glycol-electrolytes (TRILYTE) 420 g solution Take 4,000 mLs by mouth as directed. 12/24/20   Rourk, Cristopher Estimable, MD  potassium chloride SA (KLOR-CON) 20 MEQ tablet Take 20 mEq by mouth daily.    [provider]  pregabalin (LYRICA) 100 MG capsule Take 100 mg by mouth 2 (two) times daily. 11/19/18   [provider]  SYMBICORT 80-4.5 MCG/ACT inhaler Inhale 2 puffs into the lungs 2 (two) times daily. 08/19/18   [provider]  traZODone (DESYREL) 50 MG tablet Take 50 mg by mouth at bedtime.    [provider]  VOLTAREN 1 % GEL Apply 1 application 4 (four) times daily topically.  06/01/16   [provider]     Allergies:     Allergies  Allergen Reactions   Aspirin Shortness Of Breath and Palpitations   Codeine Sulfate Shortness Of Breath and Palpitations   Darvocet [Propoxyphene N-Acetaminophen] Shortness Of Breath and Palpitations  Nsaids     Exacerbation of asthma   Tramadol Shortness Of Breath   Gabapentin Other (See Comments)    dizziness   Penicillins Other (See Comments)    Has patient had a PCN reaction causing immediate rash, facial/tongue/throat swelling, SOB or lightheadedness with hypotension: No Has patient had a PCN reaction causing severe rash involving mucus membranes or skin necrosis: No Has  patient had a PCN reaction that required hospitalization No Has patient had a PCN reaction occurring within the last 10 years: Yes If all of the above answers are "NO", then may proceed with Cephalosporin use.      Physical Exam:   Vitals  Blood pressure (!) 133/93, pulse (!) 55, temperature 97.9 F (36.6 C), temperature source Oral, resp. rate 16, height 5' 2"  (1.575 m), weight 102.1 kg, SpO2 99 %.   1. General developed female, laying in bed, no apparent distress  2. Normal affect and insight, Not Suicidal or Homicidal, Awake Alert, Oriented X 3.  3. No F.N deficits, ALL C.Nerves Intact, Strength 5/5 all 4 extremities, Sensation intact all 4 extremities, Plantars down going.  4. Ears and Eyes appear Normal, Conjunctivae clear, PERRLA. Moist Oral Mucosa.  5. Supple Neck, No JVD, No cervical lymphadenopathy appriciated, No Carotid Bruits.  6. Symmetrical Chest wall movement, Good air movement bilaterally, CTAB.  7. RRR, No Gallops, Rubs or Murmurs, No Parasternal Heave.  8. Positive Bowel Sounds, Abdomen Soft, No tenderness, No organomegaly appriciated,No rebound -guarding or rigidity.  9.  No Cyanosis, Normal Skin Turgor, No Skin Rash or Bruise.  10. Good muscle tone,  joints appear normal , no effusions, Normal ROM.  11. No Palpable Lymph Nodes in Neck or Axillae    Data Review:    CBC Recent Labs  Lab 02/20/21 1737 02/20/21 1743  WBC 6.6  --   HGB 11.2* 11.6*  HCT 36.1 34.0*  PLT 221  --   MCV 95.3  --   MCH 29.6  --   MCHC 31.0  --   RDW 16.2*  --   LYMPHSABS 1.2  --   MONOABS 0.9  --   EOSABS 0.4  --   BASOSABS 0.1  --    ------------------------------------------------------------------------------------------------------------------  Chemistries  Recent Labs  Lab 02/20/21 1737 02/20/21 1743  NA 139 140  K 3.9 3.9  CL 109 108  CO2 23  --   GLUCOSE 85 79  BUN 31* 28*  CREATININE 1.87* 2.00*  CALCIUM 8.0*  --   AST 31  --   ALT 36  --    ALKPHOS 69  --   BILITOT 0.5  --    ------------------------------------------------------------------------------------------------------------------ estimated creatinine clearance is 32.6 mL/min (A) (by C-G formula based on SCr of 2 mg/dL (H)). ------------------------------------------------------------------------------------------------------------------ No results for input(s): TSH, T4TOTAL, T3FREE, THYROIDAB in the last 72 hours.  Invalid input(s): FREET3  Coagulation profile Recent Labs  Lab 02/20/21 1737  INR 1.1   ------------------------------------------------------------------------------------------------------------------- No results for input(s): DDIMER in the last 72 hours. -------------------------------------------------------------------------------------------------------------------  Cardiac Enzymes No results for input(s): CKMB, TROPONINI, MYOGLOBIN in the last 168 hours.  Invalid input(s): CK ------------------------------------------------------------------------------------------------------------------    Component Value Date/Time   BNP 84.7 05/12/2014 1032     ---------------------------------------------------------------------------------------------------------------  Urinalysis    Component Value Date/Time   COLORURINE YELLOW 02/20/2021 1930   APPEARANCEUR CLEAR 02/20/2021 1930   LABSPEC 1.006 02/20/2021 1930   PHURINE 5.0 02/20/2021 [place] NEGATIVE 02/20/2021 [place] NEGATIVE 02/20/2021 1930  BILIRUBINUR NEGATIVE 02/20/2021 [place] NEGATIVE 02/20/2021 1930   PROTEINUR NEGATIVE 02/20/2021 1930   UROBILINOGEN 1.0 05/12/2014 1319   NITRITE NEGATIVE 02/20/2021 1930   LEUKOCYTESUR NEGATIVE 02/20/2021 1930    ----------------------------------------------------------------------------------------------------------------   Imaging Results:    MR ANGIO HEAD WO CONTRAST  Result Date: 02/20/2021 CLINICAL DATA:   Initial evaluation for neuro deficit, stroke. Dizziness with headache and left-sided sensation changes. EXAM: MRI HEAD WITHOUT CONTRAST MRA HEAD WITHOUT CONTRAST MRA NECK WITHOUT CONTRAST TECHNIQUE: Multiplanar, multiecho pulse sequences of the brain and surrounding structures were obtained without intravenous contrast. Angiographic images of the Circle of Willis were obtained using MRA technique without intravenous contrast. Angiographic images of the neck were obtained using MRA technique without intravenous contrast. Carotid stenosis measurements (when applicable) are obtained utilizing NASCET criteria, using the distal internal carotid diameter as the denominator. COMPARISON:  Prior head CT from earlier the same day. FINDINGS: MRI HEAD FINDINGS Brain: Cerebral volume within normal limits for age. Mild patchy T2/FLAIR hyperintensity within the periventricular deep white matter both cerebral hemispheres most consistent with chronic small vessel ischemic disease, mild for age. No abnormal foci of restricted diffusion to suggest acute or subacute ischemia. Gray-white matter differentiation maintained. No encephalomalacia to suggest chronic cortical infarction. No evidence for acute or chronic intracranial hemorrhage. No mass lesion or midline shift. No hydrocephalus or extra-axial fluid collection. Pituitary gland suprasellar region within normal limits. Vascular: Major intracranial vascular flow voids are well maintained. Skull and upper cervical spine: Craniocervical junction within normal limits. Degenerative spondylosis noted at C3-4. Bone marrow signal intensity grossly within normal limits. No scalp soft tissue abnormality. Sinuses/Orbits: Globes and orbital soft tissues demonstrate no acute finding. Scattered mucosal thickening noted within the ethmoidal air cells. Paranasal sinuses are otherwise clear. No significant mastoid effusion. Inner ear structures grossly normal. Other: None. MRA HEAD FINDINGS  ANTERIOR CIRCULATION: Examination mildly degraded by motion. Visualized distal cervical segments of the internal carotid arteries are patent with antegrade flow. Petrous, cavernous, and supraclinoid segments patent without definite stenosis on this motion degraded exam. A1 segments patent bilaterally. Normal anterior communicating artery complex. Anterior cerebral arteries patent without stenosis. No M1 stenosis or occlusion. No proximal MCA branch occlusion. Distal MCA branches perfused and symmetric. POSTERIOR CIRCULATION: Both vertebral arteries patent to the vertebrobasilar junction without stenosis. Neither PICA origin well visualized. Basilar patent to its distal aspect without stenosis. Superior cerebral arteries patent at their origins. Both PCAs primarily supplied via the basilar well perfused to their distal aspects without stenosis. Anatomic variants: None significant. No visible aneurysm or other vascular abnormality. MRA NECK FINDINGS AORTIC ARCH: Examination technically limited by motion and lack of IV contrast. Aortic arch and origin of the great vessels not included or well assessed on this examination. RIGHT CAROTID SYSTEM: Visualized right CCA patent without stenosis. No significant atheromatous irregularity or narrowing about the right carotid bulb. Visualized right ICA patent without stenosis, evidence for dissection or occlusion. LEFT CAROTID SYSTEM: Visualized left CCA patent without stenosis. No significant atheromatous narrowing or irregularity about the left carotid bulb. Visualized left ICA patent without stenosis, evidence for dissection, or occlusion. VERTEBRAL ARTERIES: Vertebral arteries appear to arise from the subclavian arteries, although neither vertebral artery origin well visualized on this exam. Right vertebral artery slightly dominant. Visualized portions of the vertebral arteries are patent with antegrade flow, with no visible stenosis, evidence for dissection or occlusion.  IMPRESSION: MRI HEAD: 1. No acute intracranial infarct or other abnormality. 2. Mild chronic microvascular ischemic disease  for age. MRA HEAD: Negative intracranial MRA. No large vessel occlusion, hemodynamically significant stenosis, or other acute vascular abnormality. MRA NECK: Negative MRA of the neck. No hemodynamically significant or critical flow limiting stenosis. Electronically Signed   By: Jeannine Boga M.D.   On: 02/20/2021 19:29   MR ANGIO NECK WO CONTRAST  Result Date: 02/20/2021 CLINICAL DATA:  Initial evaluation for neuro deficit, stroke. Dizziness with headache and left-sided sensation changes. EXAM: MRI HEAD WITHOUT CONTRAST MRA HEAD WITHOUT CONTRAST MRA NECK WITHOUT CONTRAST TECHNIQUE: Multiplanar, multiecho pulse sequences of the brain and surrounding structures were obtained without intravenous contrast. Angiographic images of the Circle of Willis were obtained using MRA technique without intravenous contrast. Angiographic images of the neck were obtained using MRA technique without intravenous contrast. Carotid stenosis measurements (when applicable) are obtained utilizing NASCET criteria, using the distal internal carotid diameter as the denominator. COMPARISON:  Prior head CT from earlier the same day. FINDINGS: MRI HEAD FINDINGS Brain: Cerebral volume within normal limits for age. Mild patchy T2/FLAIR hyperintensity within the periventricular deep white matter both cerebral hemispheres most consistent with chronic small vessel ischemic disease, mild for age. No abnormal foci of restricted diffusion to suggest acute or subacute ischemia. Gray-white matter differentiation maintained. No encephalomalacia to suggest chronic cortical infarction. No evidence for acute or chronic intracranial hemorrhage. No mass lesion or midline shift. No hydrocephalus or extra-axial fluid collection. Pituitary gland suprasellar region within normal limits. Vascular: Major intracranial vascular flow  voids are well maintained. Skull and upper cervical spine: Craniocervical junction within normal limits. Degenerative spondylosis noted at C3-4. Bone marrow signal intensity grossly within normal limits. No scalp soft tissue abnormality. Sinuses/Orbits: Globes and orbital soft tissues demonstrate no acute finding. Scattered mucosal thickening noted within the ethmoidal air cells. Paranasal sinuses are otherwise clear. No significant mastoid effusion. Inner ear structures grossly normal. Other: None. MRA HEAD FINDINGS ANTERIOR CIRCULATION: Examination mildly degraded by motion. Visualized distal cervical segments of the internal carotid arteries are patent with antegrade flow. Petrous, cavernous, and supraclinoid segments patent without definite stenosis on this motion degraded exam. A1 segments patent bilaterally. Normal anterior communicating artery complex. Anterior cerebral arteries patent without stenosis. No M1 stenosis or occlusion. No proximal MCA branch occlusion. Distal MCA branches perfused and symmetric. POSTERIOR CIRCULATION: Both vertebral arteries patent to the vertebrobasilar junction without stenosis. Neither PICA origin well visualized. Basilar patent to its distal aspect without stenosis. Superior cerebral arteries patent at their origins. Both PCAs primarily supplied via the basilar well perfused to their distal aspects without stenosis. Anatomic variants: None significant. No visible aneurysm or other vascular abnormality. MRA NECK FINDINGS AORTIC ARCH: Examination technically limited by motion and lack of IV contrast. Aortic arch and origin of the great vessels not included or well assessed on this examination. RIGHT CAROTID SYSTEM: Visualized right CCA patent without stenosis. No significant atheromatous irregularity or narrowing about the right carotid bulb. Visualized right ICA patent without stenosis, evidence for dissection or occlusion. LEFT CAROTID SYSTEM: Visualized left CCA patent  without stenosis. No significant atheromatous narrowing or irregularity about the left carotid bulb. Visualized left ICA patent without stenosis, evidence for dissection, or occlusion. VERTEBRAL ARTERIES: Vertebral arteries appear to arise from the subclavian arteries, although neither vertebral artery origin well visualized on this exam. Right vertebral artery slightly dominant. Visualized portions of the vertebral arteries are patent with antegrade flow, with no visible stenosis, evidence for dissection or occlusion. IMPRESSION: MRI HEAD: 1. No acute intracranial infarct or other abnormality. 2. Mild  chronic microvascular ischemic disease for age. MRA HEAD: Negative intracranial MRA. No large vessel occlusion, hemodynamically significant stenosis, or other acute vascular abnormality. MRA NECK: Negative MRA of the neck. No hemodynamically significant or critical flow limiting stenosis. Electronically Signed   By: Jeannine Boga M.D.   On: 02/20/2021 19:29   MR BRAIN WO CONTRAST  Result Date: 02/20/2021 CLINICAL DATA:  Initial evaluation for neuro deficit, stroke. Dizziness with headache and left-sided sensation changes. EXAM: MRI HEAD WITHOUT CONTRAST MRA HEAD WITHOUT CONTRAST MRA NECK WITHOUT CONTRAST TECHNIQUE: Multiplanar, multiecho pulse sequences of the brain and surrounding structures were obtained without intravenous contrast. Angiographic images of the Circle of Willis were obtained using MRA technique without intravenous contrast. Angiographic images of the neck were obtained using MRA technique without intravenous contrast. Carotid stenosis measurements (when applicable) are obtained utilizing NASCET criteria, using the distal internal carotid diameter as the denominator. COMPARISON:  Prior head CT from earlier the same day. FINDINGS: MRI HEAD FINDINGS Brain: Cerebral volume within normal limits for age. Mild patchy T2/FLAIR hyperintensity within the periventricular deep white matter both  cerebral hemispheres most consistent with chronic small vessel ischemic disease, mild for age. No abnormal foci of restricted diffusion to suggest acute or subacute ischemia. Gray-white matter differentiation maintained. No encephalomalacia to suggest chronic cortical infarction. No evidence for acute or chronic intracranial hemorrhage. No mass lesion or midline shift. No hydrocephalus or extra-axial fluid collection. Pituitary gland suprasellar region within normal limits. Vascular: Major intracranial vascular flow voids are well maintained. Skull and upper cervical spine: Craniocervical junction within normal limits. Degenerative spondylosis noted at C3-4. Bone marrow signal intensity grossly within normal limits. No scalp soft tissue abnormality. Sinuses/Orbits: Globes and orbital soft tissues demonstrate no acute finding. Scattered mucosal thickening noted within the ethmoidal air cells. Paranasal sinuses are otherwise clear. No significant mastoid effusion. Inner ear structures grossly normal. Other: None. MRA HEAD FINDINGS ANTERIOR CIRCULATION: Examination mildly degraded by motion. Visualized distal cervical segments of the internal carotid arteries are patent with antegrade flow. Petrous, cavernous, and supraclinoid segments patent without definite stenosis on this motion degraded exam. A1 segments patent bilaterally. Normal anterior communicating artery complex. Anterior cerebral arteries patent without stenosis. No M1 stenosis or occlusion. No proximal MCA branch occlusion. Distal MCA branches perfused and symmetric. POSTERIOR CIRCULATION: Both vertebral arteries patent to the vertebrobasilar junction without stenosis. Neither PICA origin well visualized. Basilar patent to its distal aspect without stenosis. Superior cerebral arteries patent at their origins. Both PCAs primarily supplied via the basilar well perfused to their distal aspects without stenosis. Anatomic variants: None significant. No visible  aneurysm or other vascular abnormality. MRA NECK FINDINGS AORTIC ARCH: Examination technically limited by motion and lack of IV contrast. Aortic arch and origin of the great vessels not included or well assessed on this examination. RIGHT CAROTID SYSTEM: Visualized right CCA patent without stenosis. No significant atheromatous irregularity or narrowing about the right carotid bulb. Visualized right ICA patent without stenosis, evidence for dissection or occlusion. LEFT CAROTID SYSTEM: Visualized left CCA patent without stenosis. No significant atheromatous narrowing or irregularity about the left carotid bulb. Visualized left ICA patent without stenosis, evidence for dissection, or occlusion. VERTEBRAL ARTERIES: Vertebral arteries appear to arise from the subclavian arteries, although neither vertebral artery origin well visualized on this exam. Right vertebral artery slightly dominant. Visualized portions of the vertebral arteries are patent with antegrade flow, with no visible stenosis, evidence for dissection or occlusion. IMPRESSION: MRI HEAD: 1. No acute intracranial infarct or other  abnormality. 2. Mild chronic microvascular ischemic disease for age. MRA HEAD: Negative intracranial MRA. No large vessel occlusion, hemodynamically significant stenosis, or other acute vascular abnormality. MRA NECK: Negative MRA of the neck. No hemodynamically significant or critical flow limiting stenosis. Electronically Signed   By: Jeannine Boga M.D.   On: 02/20/2021 19:29   CT HEAD CODE STROKE WO CONTRAST`  Result Date: 02/20/2021 CLINICAL DATA:  Code stroke. Headache, new or worsening (Age >= 50y) EXAM: CT HEAD WITHOUT CONTRAST TECHNIQUE: Contiguous axial images were obtained from the base of the skull through the vertex without intravenous contrast. COMPARISON:  August 07, 2012. FINDINGS: Brain: No evidence of acute infarction, hemorrhage, hydrocephalus, extra-axial collection or mass lesion/mass effect. Chronic  areas of mineralization along the falx and dura, not substantially changed. Vascular: No hyperdense vessel identified. Skull: No acute fracture. Sinuses/Orbits: Clear sinuses.  Unremarkable orbits. Other: No mastoid effusions. ASPECTS [facility] Stroke Program Early CT Score) Total score (0-10 with 10 being normal): 10. IMPRESSION: No evidence of acute intracranial abnormality.   ASPECTS is 10. Findings discussed with Dr. Sabra Heck via telephone at 5:42 PM. Electronically Signed   By: Margaretha Sheffield M.D.   On: 02/20/2021 17:44    My personal review of EKG: Vent. rate 60 BPM PR interval 153 ms QRS duration 100 ms QT/QTcB 466/466 ms P-R-T axes 43 34 71 Sinus rhythm    Assessment & Plan:    Active Problems:   Atrial fibrillation with RVR (HCC)   Essential hypertension   Hypotension   Acute kidney injury ([place])   TIA (transient ischemic attack)    TIA -Patient presents with transient left-sided weakness, she was noted by teleneurology to have left lower extremity drift, left side sensory deficits, and L NLF flattening, the symptoms appears to be currently resolved. -MRI brain with no evidence of acute CVA. -MRA head and neck negative for large vessel occlusion. -Symptoms most likely provoked by low blood pressure, avoid hypotension. -When no acute CVA she will be resumed on Eliquis. -She is admitted under CVA/TIA pathway, check lipid panel, A1c, will consult PT/OT/SLP (she passed bedside swallow evaluation so she will be started on a diet), will check 2D echo and monitor on telemetry. -Neurology consult were requested in epic.  History of hypertension/hypotensive on presentation -Hypertension, she is hypotensive on presentation, responded to IV fluids, with history of iron sufficiency we will give 1 dose of stress dose steroids, will hold lisinopril and allow for permissive hypertension.  AKI -Due to volume depletion, will hold lisinopril, metformin, will keep on IV fluids and avoid  nephrotoxic medications  Diabetes mellitus -Check A1c and hold metformin, will keep on insulin sliding scale  Hyperlipidemia -We will check lipid panel, monitor statin  Adrenal insufficiency -New with home dose hydrocortisone, but will give 1 dose stress dose currently of 100 mg IV hydrocortisone given hypotension on presentation.  A. fib -Given no acute CVA, continue with Eliquis for anticoagulation.  Hypothyroidism -Continue with Synthroid.  History of COPD -No active wheezing, continue with home medications  DVT Prophylaxis on Eliquis  AM Labs Ordered, also please review Full Orders  Family Communication: Admission, patients condition and plan of care including tests being ordered have been discussed with the patient  who indicate understanding and agree with the plan and Code Status.  Code Status Full  Likely DC to  home  Condition GUARDED    Consults called: tele neuro     Admission status: observation    Time spent in minutes :  60 minutes   Phillips Climes M.D on 02/20/2021 at 8:42 PM   [facility] - Office  [phone]

## 2021-02-20 NOTE — ED Notes (Signed)
Updated daughter Barnabas Lister) per pt request, daughter would like to be contacted if condition changes. Ensured her number is correct in chart.

## 2021-02-20 NOTE — ED Triage Notes (Addendum)
Pt presents with dizzines and HA, L sided sensation change, last known well 11am, takes blood thinners (eliquis), describes HA as being all at once. H/o HTN, afib. Hypotensive with EMS 90/60 manual, received 250cc with EMS. Endorses CP earlier today. Denies emesis

## 2021-02-20 NOTE — ED Notes (Signed)
[facility] called at this time for Neurology consult.

## 2021-02-20 NOTE — ED Provider Notes (Signed)
[facility] EMERGENCY DEPARTMENT Provider Note   CSN: [id number] Arrival date & time: 02/20/21  1710  An emergency department physician performed an initial assessment on this suspected stroke patient at 1724.  History Chief Complaint  Patient presents with   Headache    Deborah Murray is a 60 y.o. female.   Headache  This patient is a 60 year old female, she has a history of atrial fibrillation and some chronic back pain, she walks with a walker at baseline because of her back pain.  She takes Eliquis, as well as multiple other medications including metformin, lisinopril, Linzess, Xolair, Lyrica, Synthroid, Cymbalta, Cortef, omeprazole and Singulair.  The patient presents by EMS with a severe headache which started at 12:00 PM, she feels like the last time she was normal was around 11:00 this morning, at around 12:00 she had severe onset of acute headache, global, severe, persistent, currently measured at 8 out of 10.  It is not associated with nausea or vomiting but it is associated with a feeling of vertigo and room spinning, she has not been able to ambulate since because of the dizziness.  She denies fevers or chills, denies diarrhea or rectal bleeding, denies swelling of the legs.  She does endorse having numbness of the left side of her body which occurred about the same time.  She denies having any weakness of the arms or the legs.  Of note the patient does not have a prior history of headaches  Past Medical History:  Diagnosis Date   Adrenal abnormality ([place])    Adrenal insufficiency ([place]) 2013   Tx at [facility]   Anemia    Arthritis    Asthma    Atrial fibrillation (HCC)    Chronic back pain    COPD (chronic obstructive pulmonary disease) (HCC)    GERD (gastroesophageal reflux disease)    Glaucoma    Headache    Hyperlipidemia    Hypertension    Hypothyroidism    Lumbar radiculopathy    Sepsis ([place]) 11/04/2015   Sleep apnea    Syncope and collapse 05/11/11  adrenal  insuffiency  treated at [facility]   Thyroid disease    Vocal cord dysfunction     Patient Active Problem List   Diagnosis Date Noted   IDA (iron deficiency anemia) 10/24/2020   LLQ pain 11/22/2018   Constipation 07/23/2017   History of bariatric surgery 07/23/2017   Pain of upper abdomen 05/21/2017   Rectal bleeding 02/04/2017   Anasarca 11/18/2015   Generalized abdominal pain    Protein-calorie malnutrition, severe 11/05/2015   Sepsis ([place]) 11/04/2015   AKI (acute kidney injury) ([place])    Pyrexia    Acute kidney injury ([place]) 11/02/2015   Hyponatremia 78/24/2353   Metabolic acidosis 61/44/3154   Hypocalcemia 11/01/2015   C. difficile colitis 10/31/2015   Pancolitis ([place]) 10/30/2015   C. difficile diarrhea 10/30/2015   Abdominal pain in female 10/30/2015   Hypotension 10/30/2015   History of atrial fibrillation- NSR now 10/30/2015   History of adrenal insufficiency 10/30/2015   History of hypertension 10/30/2015   Esophageal reflux    Nausea    GERD (gastroesophageal reflux disease) 08/28/2015   Nausea without vomiting 08/28/2015   Abdominal pain, epigastric 08/28/2015   Dark stools 08/28/2015   Atrial fibrillation with RVR ([place]) 05/12/2014   Hypokalemia 05/12/2014   Essential hypertension 05/12/2014   Severe persistent asthma 05/12/2014   OSA on CPAP 05/12/2014   Toxic nodular goiter 05/12/2014   Morbid obesity (  [place]) 05/12/2014   Pain in joint, shoulder region 08/02/2013   Muscle weakness (generalized) 08/02/2013   Decreased range of motion of left shoulder 08/02/2013   Tight fascia 08/02/2013    Past Surgical History:  Procedure Laterality Date   ABDOMINAL HYSTERECTOMY     BACK SURGERY     disc    CARDIAC CATHETERIZATION  2012 at [place] WITH PERCUTANEOUS PINNING Left 03/26/2016   Procedure: CLOSED REDUCTION METACARPAL WITH PERCUTANEOUS PINNING;  Surgeon: Leanora Cover, MD;   Location: [place];  Service: Orthopedics;  Laterality: Left;   COLONOSCOPY  2010   [place]: anal papilla, otherwise normal    COLONOSCOPY WITH PROPOFOL N/A 03/03/2017   sigmoid divertivcula, 4 mm polyp in rectum (tubular adenoma), non-bleeding internal hemorrhoids, surveillance in 7 years   COLONOSCOPY WITH PROPOFOL N/A 12/26/2020   Procedure: COLONOSCOPY WITH PROPOFOL;  Surgeon: Daneil Dolin, MD;  [facility];  Service: Endoscopy;  Laterality: N/A;  12:30pm   ESOPHAGOGASTRODUODENOSCOPY N/A 09/17/2015   Surgicall altered stomach. NO specimens collected. normal esophagus   ESOPHAGOGASTRODUODENOSCOPY (EGD) WITH PROPOFOL N/A 03/03/2017   normal esophagus   ESOPHAGOGASTRODUODENOSCOPY (EGD) WITH PROPOFOL N/A 12/26/2020   Procedure: ESOPHAGOGASTRODUODENOSCOPY (EGD)/ POSSIBLE ESOPHAGEAL DILATION WITH PROPOFOL;  Surgeon: Daneil Dolin, MD;  [facility];  Service: Endoscopy;  Laterality: N/A;   eye surg for glaucoma     gastic by-pass  2008   Gastric by-pass in 2008 at [place] 01/26/2019   Procedure: REPAIR OF SUPRAUMBILICAL INCISIONAL HERNIA WITH MESH;  Surgeon: Erroll Luna, MD;  Location: [place];  Service: General;  Laterality: N/A;   right knee surg     arthroscopy   SHOULDER SURGERY Bilateral      OB History   No obstetric history on file.     Family History  Problem Relation Age of Onset   Heart attack Mother    Hypertension Mother    Heart attack Father    Asthma Father    Hyperlipidemia Father    Hypertension Father    Heart attack Sister    Arrhythmia Sister    Asthma Sister    Hyperlipidemia Sister    Hypertension Sister    Asthma Brother    Hypertension Brother    Colon cancer Neg Hx     Social History   Tobacco Use   Smoking status: Never   Smokeless tobacco: Never  Vaping Use   Vaping Use: Never used  Substance Use Topics   Alcohol use: No   Drug use: No    Home  Medications Prior to Admission medications   Medication Sig Start Date End Date Taking? Authorizing Provider  acetaminophen (TYLENOL) 500 MG tablet Take 1,000 mg every 6 (six) hours as needed by mouth (for pain.).    [provider]  albuterol (PROVENTIL HFA;VENTOLIN HFA) 108 (90 BASE) MCG/ACT inhaler Inhale 2 puffs into the lungs every 4 (four) hours as needed for wheezing.     [provider]  apixaban (ELIQUIS) 5 MG TABS tablet Take 5 mg by mouth 2 (two) times daily.    [provider]  atorvastatin (LIPITOR) 20 MG tablet Take 20 mg by mouth every evening.     [provider]  brimonidine (ALPHAGAN) 0.2 % ophthalmic solution Place 1 drop into both eyes 2 (two) times daily.    [provider]  Calcium Carbonate-Vitamin D (CALTRATE 600+D PO) Take 1 tablet by mouth daily.    [provider]  cetirizine (ZYRTEC) 5 MG tablet Take 5 mg by mouth daily.    [provider]  cholecalciferol (VITAMIN D3) 25 MCG (1000 UNIT) tablet Take 1,000 Units by mouth daily.    [provider]  ciprofloxacin (CIPRO) 750 MG tablet Take 750 mg by mouth daily. 11/26/20   [provider]  DULoxetine (CYMBALTA) 30 MG capsule Take 30 mg by mouth daily. 11/07/18   [provider]  ferrous sulfate 325 (65 FE) MG tablet Take 325 mg by mouth daily with breakfast.    [provider]  flunisolide (NASALIDE) 25 MCG/ACT (0.025%) SOLN Place 2 sprays into the nose daily as needed for allergies. 08/12/18   [provider]  hydrocortisone (CORTEF) 10 MG tablet Take 5-10 mg by mouth See admin instructions. Take 1 tablet (10 mg) by mouth in the morning & take 0.5 tablet (5 mg) by mouth in the afternoon. 12/01/18   [provider]  levocetirizine (XYZAL) 5 MG tablet Take 5 mg every evening by mouth.    [provider]  levothyroxine (SYNTHROID) 50 MCG tablet Take 50 mcg by mouth daily before breakfast. 12/01/18   [provider]  LINZESS 145 MCG CAPS capsule TAKE ONE CAPSULE BY MOUTH DAILY BEFORE BREAKFAST. Patient taking differently: Take 145 mcg by mouth daily as needed (constipation). 09/03/20   Annitta Needs, NP  lisinopril (ZESTRIL) 10 MG tablet Take 10 mg by mouth daily.    [provider]  Melatonin 5 MG TABS Take 5 mg by mouth at bedtime.    [provider]  metFORMIN (GLUCOPHAGE-XR) 500 MG 24 hr tablet Take 500 mg by mouth daily with breakfast.    [provider]  montelukast (SINGULAIR) 10 MG tablet Take 10 mg by mouth at bedtime.    [provider]  Multiple Vitamins-Minerals (BARIATRIC MULTIVITAMINS/IRON PO) Take 1 tablet by mouth daily.    [provider]  NARCAN 4 MG/0.1ML LIQD nasal spray kit Place 1 spray into the nose once. 09/14/19   [provider]  omalizumab Arvid Right) 150 MG injection Inject 300 mg into the skin every 28 (twenty-eight) days. 10/30/19   [provider]  omeprazole (PRILOSEC) 20 MG capsule Take 1 capsule (20 mg total) by mouth daily. 30 minutes before breakfast. 04/25/18   Annitta Needs, NP  ondansetron (ZOFRAN-ODT) 4 MG disintegrating tablet TAKE 1 TABLET BY MOUTH EVERY 8 HOURS AS NEEDED. 12/10/20   Annitta Needs, NP  Oxycodone HCl 10 MG TABS Take 15 mg by mouth every 6 (six) hours as needed. 10/09/19   [provider]  polyethylene glycol (MIRALAX / GLYCOLAX) 17 g packet Take 17 g by mouth daily as needed for moderate constipation.    [provider]  polyethylene glycol-electrolytes (NULYTELY) 420 g solution As directed 11/13/20   Rourk, Cristopher Estimable, MD  polyethylene glycol-electrolytes (TRILYTE) 420 g solution Take 4,000 mLs by mouth as directed. 12/24/20   Rourk, Cristopher Estimable, MD  potassium chloride SA (KLOR-CON) 20 MEQ tablet Take 20 mEq by mouth daily.    [provider]  pregabalin (LYRICA) 100 MG capsule Take 100 mg by mouth 2 (two) times daily. 11/19/18   [provider]  SYMBICORT  80-4.5 MCG/ACT inhaler Inhale 2 puffs into the lungs 2 (two) times daily. 08/19/18   [provider]  traZODone (DESYREL) 50 MG tablet Take 50  mg by mouth at bedtime.    [provider]  VOLTAREN 1 % GEL Apply 1 application 4 (four) times daily topically.  06/01/16   [provider]    Allergies    Aspirin, Codeine sulfate, Darvocet [propoxyphene n-acetaminophen], Nsaids, Tramadol, Gabapentin, and Penicillins  Review of Systems   Review of Systems  Neurological:  Positive for headaches.  All other systems reviewed and are negative.  Physical Exam Updated Vital Signs BP (!) 152/100   Pulse (!) 59   Temp 97.9 F (36.6 C) (Oral)   Resp 17   Ht 1.575 m (5' 2" )   Wt 102.1 kg   SpO2 100%   BMI 41.15 kg/m   Physical Exam Vitals and nursing note reviewed.  Constitutional:      General: She is not in acute distress.    Appearance: She is well-developed.  HENT:     Head: Normocephalic and atraumatic.     Nose: Nose normal. No congestion or rhinorrhea.     Mouth/Throat:     Mouth: Mucous membranes are moist.     Pharynx: Oropharynx is clear. No oropharyngeal exudate.  Eyes:     General: No scleral icterus.       Right eye: No discharge.        Left eye: No discharge.     Conjunctiva/sclera: Conjunctivae normal.     Pupils: Pupils are equal, round, and reactive to light.  Neck:     Thyroid: No thyromegaly.     Vascular: No JVD.  Cardiovascular:     Rate and Rhythm: Normal rate and regular rhythm.     Heart sounds: Normal heart sounds. No murmur heard.   No friction rub. No gallop.  Pulmonary:     Effort: Pulmonary effort is normal. No respiratory distress.     Breath sounds: Normal breath sounds. No wheezing or rales.  Abdominal:     General: Bowel sounds are normal. There is no distension.     Palpations: Abdomen is soft. There is no mass.     Tenderness: There is no abdominal tenderness.  Musculoskeletal:        General: No tenderness. Normal  range of motion.     Cervical back: Normal range of motion and neck supple.     Right lower leg: No edema.     Left lower leg: No edema.  Lymphadenopathy:     Cervical: No cervical adenopathy.  Skin:    General: Skin is warm and dry.     Findings: No erythema or rash.  Neurological:     Mental Status: She is alert.     Coordination: Coordination normal.     Comments: Cranial nerves III through XII are normal, she has normal peripheral visual fields, she has no nystagmus, she has normal facial symmetry, she has normal strength in all 4 extremities and can straight leg raise.  She has no pronator drift, she has normal finger-nose-finger, she has decree sensation of the left side of the face arm and leg.  Psychiatric:        Behavior: Behavior normal.    ED Results / Procedures / Treatments   Labs (all labs ordered are listed, but only abnormal results are displayed) Labs Reviewed  CBC - Abnormal; Notable for the following components:      Result Value   RBC 3.79 (*)    Hemoglobin 11.2 (*)    RDW 16.2 (*)    All other components within normal limits  COMPREHENSIVE  METABOLIC PANEL - Abnormal; Notable for the following components:   BUN 31 (*)    Creatinine, Ser 1.87 (*)    Calcium 8.0 (*)    Total Protein 5.6 (*)    Albumin 3.3 (*)    GFR, Estimated 30 (*)    All other components within normal limits  RAPID URINE DRUG SCREEN, HOSP PERFORMED - Abnormal; Notable for the following components:   Opiates POSITIVE (*)    All other components within normal limits  I-STAT CHEM 8, ED - Abnormal; Notable for the following components:   BUN 28 (*)    Creatinine, Ser 2.00 (*)    Calcium, Ion 1.10 (*)    Hemoglobin 11.6 (*)    HCT 34.0 (*)    All other components within normal limits  RESP PANEL BY RT-PCR (FLU A&B, COVID) ARPGX2  ETHANOL  PROTIME-INR  APTT  DIFFERENTIAL  URINALYSIS, ROUTINE W REFLEX MICROSCOPIC  CBG MONITORING, ED    EKG EKG  Interpretation  Date/Time:  Thursday February 20 2021 17:16:38 EST Ventricular Rate:  60 PR Interval:  153 QRS Duration: 100 QT Interval:  466 QTC Calculation: 466 R Axis:   34 Text Interpretation: Sinus rhythm since last tracing no significant change Confirmed by Noemi Chapel [phone]) on 02/20/2021 6:02:15 PM  Radiology MR ANGIO HEAD WO CONTRAST  Result Date: 02/20/2021 CLINICAL DATA:  Initial evaluation for neuro deficit, stroke. Dizziness with headache and left-sided sensation changes. EXAM: MRI HEAD WITHOUT CONTRAST MRA HEAD WITHOUT CONTRAST MRA NECK WITHOUT CONTRAST TECHNIQUE: Multiplanar, multiecho pulse sequences of the brain and surrounding structures were obtained without intravenous contrast. Angiographic images of the Circle of Willis were obtained using MRA technique without intravenous contrast. Angiographic images of the neck were obtained using MRA technique without intravenous contrast. Carotid stenosis measurements (when applicable) are obtained utilizing NASCET criteria, using the distal internal carotid diameter as the denominator. COMPARISON:  Prior head CT from earlier the same day. FINDINGS: MRI HEAD FINDINGS Brain: Cerebral volume within normal limits for age. Mild patchy T2/FLAIR hyperintensity within the periventricular deep white matter both cerebral hemispheres most consistent with chronic small vessel ischemic disease, mild for age. No abnormal foci of restricted diffusion to suggest acute or subacute ischemia. Gray-white matter differentiation maintained. No encephalomalacia to suggest chronic cortical infarction. No evidence for acute or chronic intracranial hemorrhage. No mass lesion or midline shift. No hydrocephalus or extra-axial fluid collection. Pituitary gland suprasellar region within normal limits. Vascular: Major intracranial vascular flow voids are well maintained. Skull and upper cervical spine: Craniocervical junction within normal limits. Degenerative  spondylosis noted at C3-4. Bone marrow signal intensity grossly within normal limits. No scalp soft tissue abnormality. Sinuses/Orbits: Globes and orbital soft tissues demonstrate no acute finding. Scattered mucosal thickening noted within the ethmoidal air cells. Paranasal sinuses are otherwise clear. No significant mastoid effusion. Inner ear structures grossly normal. Other: None. MRA HEAD FINDINGS ANTERIOR CIRCULATION: Examination mildly degraded by motion. Visualized distal cervical segments of the internal carotid arteries are patent with antegrade flow. Petrous, cavernous, and supraclinoid segments patent without definite stenosis on this motion degraded exam. A1 segments patent bilaterally. Normal anterior communicating artery complex. Anterior cerebral arteries patent without stenosis. No M1 stenosis or occlusion. No proximal MCA branch occlusion. Distal MCA branches perfused and symmetric. POSTERIOR CIRCULATION: Both vertebral arteries patent to the vertebrobasilar junction without stenosis. Neither PICA origin well visualized. Basilar patent to its distal aspect without stenosis. Superior cerebral arteries patent at their origins. Both PCAs primarily supplied via the  basilar well perfused to their distal aspects without stenosis. Anatomic variants: None significant. No visible aneurysm or other vascular abnormality. MRA NECK FINDINGS AORTIC ARCH: Examination technically limited by motion and lack of IV contrast. Aortic arch and origin of the great vessels not included or well assessed on this examination. RIGHT CAROTID SYSTEM: Visualized right CCA patent without stenosis. No significant atheromatous irregularity or narrowing about the right carotid bulb. Visualized right ICA patent without stenosis, evidence for dissection or occlusion. LEFT CAROTID SYSTEM: Visualized left CCA patent without stenosis. No significant atheromatous narrowing or irregularity about the left carotid bulb. Visualized left ICA  patent without stenosis, evidence for dissection, or occlusion. VERTEBRAL ARTERIES: Vertebral arteries appear to arise from the subclavian arteries, although neither vertebral artery origin well visualized on this exam. Right vertebral artery slightly dominant. Visualized portions of the vertebral arteries are patent with antegrade flow, with no visible stenosis, evidence for dissection or occlusion. IMPRESSION: MRI HEAD: 1. No acute intracranial infarct or other abnormality. 2. Mild chronic microvascular ischemic disease for age. MRA HEAD: Negative intracranial MRA. No large vessel occlusion, hemodynamically significant stenosis, or other acute vascular abnormality. MRA NECK: Negative MRA of the neck. No hemodynamically significant or critical flow limiting stenosis. Electronically Signed   By: Jeannine Boga M.D.   On: 02/20/2021 19:29   MR ANGIO NECK WO CONTRAST  Result Date: 02/20/2021 CLINICAL DATA:  Initial evaluation for neuro deficit, stroke. Dizziness with headache and left-sided sensation changes. EXAM: MRI HEAD WITHOUT CONTRAST MRA HEAD WITHOUT CONTRAST MRA NECK WITHOUT CONTRAST TECHNIQUE: Multiplanar, multiecho pulse sequences of the brain and surrounding structures were obtained without intravenous contrast. Angiographic images of the Circle of Willis were obtained using MRA technique without intravenous contrast. Angiographic images of the neck were obtained using MRA technique without intravenous contrast. Carotid stenosis measurements (when applicable) are obtained utilizing NASCET criteria, using the distal internal carotid diameter as the denominator. COMPARISON:  Prior head CT from earlier the same day. FINDINGS: MRI HEAD FINDINGS Brain: Cerebral volume within normal limits for age. Mild patchy T2/FLAIR hyperintensity within the periventricular deep white matter both cerebral hemispheres most consistent with chronic small vessel ischemic disease, mild for age. No abnormal foci of  restricted diffusion to suggest acute or subacute ischemia. Gray-white matter differentiation maintained. No encephalomalacia to suggest chronic cortical infarction. No evidence for acute or chronic intracranial hemorrhage. No mass lesion or midline shift. No hydrocephalus or extra-axial fluid collection. Pituitary gland suprasellar region within normal limits. Vascular: Major intracranial vascular flow voids are well maintained. Skull and upper cervical spine: Craniocervical junction within normal limits. Degenerative spondylosis noted at C3-4. Bone marrow signal intensity grossly within normal limits. No scalp soft tissue abnormality. Sinuses/Orbits: Globes and orbital soft tissues demonstrate no acute finding. Scattered mucosal thickening noted within the ethmoidal air cells. Paranasal sinuses are otherwise clear. No significant mastoid effusion. Inner ear structures grossly normal. Other: None. MRA HEAD FINDINGS ANTERIOR CIRCULATION: Examination mildly degraded by motion. Visualized distal cervical segments of the internal carotid arteries are patent with antegrade flow. Petrous, cavernous, and supraclinoid segments patent without definite stenosis on this motion degraded exam. A1 segments patent bilaterally. Normal anterior communicating artery complex. Anterior cerebral arteries patent without stenosis. No M1 stenosis or occlusion. No proximal MCA branch occlusion. Distal MCA branches perfused and symmetric. POSTERIOR CIRCULATION: Both vertebral arteries patent to the vertebrobasilar junction without stenosis. Neither PICA origin well visualized. Basilar patent to its distal aspect without stenosis. Superior cerebral arteries patent at their origins. Both PCAs  primarily supplied via the basilar well perfused to their distal aspects without stenosis. Anatomic variants: None significant. No visible aneurysm or other vascular abnormality. MRA NECK FINDINGS AORTIC ARCH: Examination technically limited by motion  and lack of IV contrast. Aortic arch and origin of the great vessels not included or well assessed on this examination. RIGHT CAROTID SYSTEM: Visualized right CCA patent without stenosis. No significant atheromatous irregularity or narrowing about the right carotid bulb. Visualized right ICA patent without stenosis, evidence for dissection or occlusion. LEFT CAROTID SYSTEM: Visualized left CCA patent without stenosis. No significant atheromatous narrowing or irregularity about the left carotid bulb. Visualized left ICA patent without stenosis, evidence for dissection, or occlusion. VERTEBRAL ARTERIES: Vertebral arteries appear to arise from the subclavian arteries, although neither vertebral artery origin well visualized on this exam. Right vertebral artery slightly dominant. Visualized portions of the vertebral arteries are patent with antegrade flow, with no visible stenosis, evidence for dissection or occlusion. IMPRESSION: MRI HEAD: 1. No acute intracranial infarct or other abnormality. 2. Mild chronic microvascular ischemic disease for age. MRA HEAD: Negative intracranial MRA. No large vessel occlusion, hemodynamically significant stenosis, or other acute vascular abnormality. MRA NECK: Negative MRA of the neck. No hemodynamically significant or critical flow limiting stenosis. Electronically Signed   By: Jeannine Boga M.D.   On: 02/20/2021 19:29   MR BRAIN WO CONTRAST  Result Date: 02/20/2021 CLINICAL DATA:  Initial evaluation for neuro deficit, stroke. Dizziness with headache and left-sided sensation changes. EXAM: MRI HEAD WITHOUT CONTRAST MRA HEAD WITHOUT CONTRAST MRA NECK WITHOUT CONTRAST TECHNIQUE: Multiplanar, multiecho pulse sequences of the brain and surrounding structures were obtained without intravenous contrast. Angiographic images of the Circle of Willis were obtained using MRA technique without intravenous contrast. Angiographic images of the neck were obtained using MRA technique  without intravenous contrast. Carotid stenosis measurements (when applicable) are obtained utilizing NASCET criteria, using the distal internal carotid diameter as the denominator. COMPARISON:  Prior head CT from earlier the same day. FINDINGS: MRI HEAD FINDINGS Brain: Cerebral volume within normal limits for age. Mild patchy T2/FLAIR hyperintensity within the periventricular deep white matter both cerebral hemispheres most consistent with chronic small vessel ischemic disease, mild for age. No abnormal foci of restricted diffusion to suggest acute or subacute ischemia. Gray-white matter differentiation maintained. No encephalomalacia to suggest chronic cortical infarction. No evidence for acute or chronic intracranial hemorrhage. No mass lesion or midline shift. No hydrocephalus or extra-axial fluid collection. Pituitary gland suprasellar region within normal limits. Vascular: Major intracranial vascular flow voids are well maintained. Skull and upper cervical spine: Craniocervical junction within normal limits. Degenerative spondylosis noted at C3-4. Bone marrow signal intensity grossly within normal limits. No scalp soft tissue abnormality. Sinuses/Orbits: Globes and orbital soft tissues demonstrate no acute finding. Scattered mucosal thickening noted within the ethmoidal air cells. Paranasal sinuses are otherwise clear. No significant mastoid effusion. Inner ear structures grossly normal. Other: None. MRA HEAD FINDINGS ANTERIOR CIRCULATION: Examination mildly degraded by motion. Visualized distal cervical segments of the internal carotid arteries are patent with antegrade flow. Petrous, cavernous, and supraclinoid segments patent without definite stenosis on this motion degraded exam. A1 segments patent bilaterally. Normal anterior communicating artery complex. Anterior cerebral arteries patent without stenosis. No M1 stenosis or occlusion. No proximal MCA branch occlusion. Distal MCA branches perfused and  symmetric. POSTERIOR CIRCULATION: Both vertebral arteries patent to the vertebrobasilar junction without stenosis. Neither PICA origin well visualized. Basilar patent to its distal aspect without stenosis. Superior cerebral arteries patent at  their origins. Both PCAs primarily supplied via the basilar well perfused to their distal aspects without stenosis. Anatomic variants: None significant. No visible aneurysm or other vascular abnormality. MRA NECK FINDINGS AORTIC ARCH: Examination technically limited by motion and lack of IV contrast. Aortic arch and origin of the great vessels not included or well assessed on this examination. RIGHT CAROTID SYSTEM: Visualized right CCA patent without stenosis. No significant atheromatous irregularity or narrowing about the right carotid bulb. Visualized right ICA patent without stenosis, evidence for dissection or occlusion. LEFT CAROTID SYSTEM: Visualized left CCA patent without stenosis. No significant atheromatous narrowing or irregularity about the left carotid bulb. Visualized left ICA patent without stenosis, evidence for dissection, or occlusion. VERTEBRAL ARTERIES: Vertebral arteries appear to arise from the subclavian arteries, although neither vertebral artery origin well visualized on this exam. Right vertebral artery slightly dominant. Visualized portions of the vertebral arteries are patent with antegrade flow, with no visible stenosis, evidence for dissection or occlusion. IMPRESSION: MRI HEAD: 1. No acute intracranial infarct or other abnormality. 2. Mild chronic microvascular ischemic disease for age. MRA HEAD: Negative intracranial MRA. No large vessel occlusion, hemodynamically significant stenosis, or other acute vascular abnormality. MRA NECK: Negative MRA of the neck. No hemodynamically significant or critical flow limiting stenosis. Electronically Signed   By: Jeannine Boga M.D.   On: 02/20/2021 19:29   CT HEAD CODE STROKE WO CONTRAST`  Result  Date: 02/20/2021 CLINICAL DATA:  Code stroke. Headache, new or worsening (Age >= 50y) EXAM: CT HEAD WITHOUT CONTRAST TECHNIQUE: Contiguous axial images were obtained from the base of the skull through the vertex without intravenous contrast. COMPARISON:  August 07, 2012. FINDINGS: Brain: No evidence of acute infarction, hemorrhage, hydrocephalus, extra-axial collection or mass lesion/mass effect. Chronic areas of mineralization along the falx and dura, not substantially changed. Vascular: No hyperdense vessel identified. Skull: No acute fracture. Sinuses/Orbits: Clear sinuses.  Unremarkable orbits. Other: No mastoid effusions. ASPECTS [facility] Stroke Program Early CT Score) Total score (0-10 with 10 being normal): 10. IMPRESSION: No evidence of acute intracranial abnormality.   ASPECTS is 10. Findings discussed with Dr. Sabra Heck via telephone at 5:42 PM. Electronically Signed   By: Margaretha Sheffield M.D.   On: 02/20/2021 17:44    Procedures Procedures   Medications Ordered in ED Medications  sodium chloride 0.9 % bolus 1,000 mL (0 mLs Intravenous Stopped 02/20/21 1929)  sodium chloride 0.9 % bolus 1,000 mL (0 mLs Intravenous Stopped 02/20/21 2001)  fentaNYL (SUBLIMAZE) injection 50 mcg (50 mcg Intravenous Given 02/20/21 1957)    ED Course  I have reviewed the triage vital signs and the nursing notes.  Pertinent labs & imaging results that were available during my care of the patient were reviewed by me and considered in my medical decision making (see chart for details).    MDM Rules/Calculators/A&P                           It is unclear exactly what is causing the patient's symptoms however given the acute onset of headache with a focal neurologic symptoms on the left I would entertain subarachnoid hemorrhage intracranial hemorrhage, acute neurologic dysfunction such as TIA or stroke, would also consider migraine with neurologic symptoms though she has no history of migraines making this less  likely.  Last seen normal according to the patient was 11:00 AM, requested neurology consultation.  Stat CT  Cr of 2, has suspect R sided lesions -  MRI  ordered for head and neck,  Request admission for rest of the work-up, IV fluid bolus given for some hypotension, patient has an acute kidney injury with a creatinine of 2.0 compared to 0.8 at last check.  I discussed the care with Dr. Waldron Labs who will admit the patient to hospital   Final Clinical Impression(s) / ED Diagnoses Final diagnoses:  Left sided numbness  Bad headache  Acute kidney injury [facility])    Rx / Mercedes Orders ED Discharge Orders     None        Noemi Chapel, MD 02/20/21 2032

## 2021-02-21 ENCOUNTER — Observation Stay (HOSPITAL_COMMUNITY): Payer: 59

## 2021-02-21 ENCOUNTER — Observation Stay (HOSPITAL_BASED_OUTPATIENT_CLINIC_OR_DEPARTMENT_OTHER): Payer: 59

## 2021-02-21 DIAGNOSIS — I9589 Other hypotension: Secondary | ICD-10-CM | POA: Diagnosis not present

## 2021-02-21 DIAGNOSIS — R200 Anesthesia of skin: Secondary | ICD-10-CM

## 2021-02-21 DIAGNOSIS — G459 Transient cerebral ischemic attack, unspecified: Secondary | ICD-10-CM | POA: Diagnosis not present

## 2021-02-21 DIAGNOSIS — I10 Essential (primary) hypertension: Secondary | ICD-10-CM | POA: Diagnosis not present

## 2021-02-21 DIAGNOSIS — I482 Chronic atrial fibrillation, unspecified: Secondary | ICD-10-CM

## 2021-02-21 LAB — COMPREHENSIVE METABOLIC PANEL
ALT: 34 U/L (ref 0–44)
AST: 27 U/L (ref 15–41)
Albumin: 3.1 g/dL — ABNORMAL LOW (ref 3.5–5.0)
Alkaline Phosphatase: 68 U/L (ref 38–126)
Anion gap: 8 (ref 5–15)
BUN: 25 mg/dL — ABNORMAL HIGH (ref 8–23)
CO2: 20 mmol/L — ABNORMAL LOW (ref 22–32)
Calcium: 8.1 mg/dL — ABNORMAL LOW (ref 8.9–10.3)
Chloride: 114 mmol/L — ABNORMAL HIGH (ref 98–111)
Creatinine, Ser: 1.1 mg/dL — ABNORMAL HIGH (ref 0.44–1.00)
GFR, Estimated: 57 mL/min — ABNORMAL LOW (ref >60)
Glucose, Bld: 108 mg/dL — ABNORMAL HIGH (ref 70–99)
Potassium: 4.5 mmol/L (ref 3.5–5.1)
Sodium: 142 mmol/L (ref 135–145)
Total Bilirubin: 0.5 mg/dL (ref 0.3–1.2)
Total Protein: 5.5 g/dL — ABNORMAL LOW (ref 6.5–8.1)

## 2021-02-21 LAB — ECHOCARDIOGRAM COMPLETE
AR max vel: 2.92 cm2
AV Area VTI: 2.65 cm2
AV Area mean vel: 2.2 cm2
AV Mean grad: 4.8 mmHg
AV Peak grad: 7 mmHg
Ao pk vel: 1.32 m/s
Area-P 1/2: 4.21 cm2
Height: 62 in
S' Lateral: 2.5 cm
Weight: 3600 oz

## 2021-02-21 LAB — HIV ANTIBODY (ROUTINE TESTING W REFLEX): HIV Screen 4th Generation wRfx: NONREACTIVE

## 2021-02-21 LAB — GLUCOSE, CAPILLARY
Glucose-Capillary: 111 mg/dL — ABNORMAL HIGH (ref 70–99)
Glucose-Capillary: 88 mg/dL (ref 70–99)

## 2021-02-21 LAB — CBC
HCT: 38.2 % (ref 36.0–46.0)
Hemoglobin: 12 g/dL (ref 12.0–15.0)
MCH: 29 pg (ref 26.0–34.0)
MCHC: 31.4 g/dL (ref 30.0–36.0)
MCV: 92.3 fL (ref 80.0–100.0)
Platelets: 241 10*3/uL (ref 150–400)
RBC: 4.14 MIL/uL (ref 3.87–5.11)
RDW: 15.9 % — ABNORMAL HIGH (ref 11.5–15.5)
WBC: 4.8 10*3/uL (ref 4.0–10.5)
nRBC: 0 % (ref 0.0–0.2)

## 2021-02-21 LAB — VITAMIN D 25 HYDROXY (VIT D DEFICIENCY, FRACTURES): Vit D, 25-Hydroxy: 29.5 ng/mL — ABNORMAL LOW (ref 30–100)

## 2021-02-21 LAB — LIPID PANEL
Cholesterol: 159 mg/dL (ref 0–200)
HDL: 78 mg/dL (ref >40)
LDL Cholesterol: 74 mg/dL (ref 0–99)
Total CHOL/HDL Ratio: 2 RATIO
Triglycerides: 34 mg/dL (ref <150)
VLDL: 7 mg/dL (ref 0–40)

## 2021-02-21 LAB — HEMOGLOBIN A1C
Hgb A1c MFr Bld: 5.6 % (ref 4.8–5.6)
Hgb A1c MFr Bld: 5.6 % (ref 4.8–5.6)
Mean Plasma Glucose: 114.02 mg/dL
Mean Plasma Glucose: 114.02 mg/dL

## 2021-02-21 LAB — VITAMIN B12: Vitamin B-12: 1584 pg/mL — ABNORMAL HIGH (ref 180–914)

## 2021-02-21 MED ORDER — OXYCODONE HCL 5 MG PO TABS
15.0000 mg | ORAL_TABLET | Freq: Two times a day (BID) | ORAL | Status: DC | PRN
  Administered 2021-02-21: 15 mg via ORAL
  Filled 2021-02-21: qty 3

## 2021-02-21 MED ORDER — LISINOPRIL 10 MG PO TABS: 5.0000 mg | ORAL_TABLET | Freq: Every day | ORAL | Status: AC

## 2021-02-21 MED ORDER — POLYETHYLENE GLYCOL 3350 17 G PO PACK: 17.0000 g | PACK | Freq: Every day | ORAL | 0 refills | Status: AC | PRN

## 2021-02-21 MED ORDER — SYMBICORT 80-4.5 MCG/ACT IN AERO: 2.0000 | INHALATION_SPRAY | Freq: Two times a day (BID) | RESPIRATORY_TRACT | 12 refills | Status: AC

## 2021-02-21 NOTE — Progress Notes (Signed)
SLP Cancellation Note  Patient Details Name: Deborah Murray MRN: [id number] DOB: 04-Sep-1960   Cancelled treatment:       Reason Eval/Treat Not Completed: SLP screened, no needs identified, will sign off   Elvina Sidle, M.S., CCC-SLP 02/21/2021, 12:41 PM

## 2021-02-21 NOTE — Plan of Care (Signed)
  Problem: Acute Rehab PT Goals(only PT should resolve) Goal: Patient Will Transfer Sit To/From Stand Outcome: Progressing Flowsheets (Taken 02/21/2021 0936) Patient will transfer sit to/from stand: with modified independence Goal: Pt Will Transfer Bed To Chair/Chair To Bed Outcome: Progressing Flowsheets (Taken 02/21/2021 0936) Pt will Transfer Bed to Chair/Chair to Bed: with modified independence Goal: Pt Will Ambulate Outcome: Progressing Flowsheets (Taken 02/21/2021 0936) Pt will Ambulate:  50 feet  with least restrictive assistive device  with modified independence Goal: Pt/caregiver will Perform Home Exercise Program Outcome: Progressing Flowsheets (Taken 02/21/2021 0936) Pt/caregiver will Perform Home Exercise Program:  For increased strengthening  For improved balance  Independently  9:37 AM, 02/21/21 Mearl Latin PT, DPT Physical Therapist at [facility]

## 2021-02-21 NOTE — Progress Notes (Signed)
  Echocardiogram 2D Echocardiogram has been performed.  Deborah Murray 02/21/2021, 10:08 AM

## 2021-02-21 NOTE — Evaluation (Signed)
Physical Therapy Evaluation Patient Details Name: Deborah Murray MRN: [id number] DOB: 11-19-1960 Today's Date: 02/21/2021  History of Present Illness  Deborah Murray  is a 60 y.o. female, with past medical history of COPD, A. fib on Eliquis, and  Sufficiency, she presents to ED secondary to complaints of headache, dizziness, left-sided weakness, symptoms started at 11 AM today, upon arrival to ED she was noted to be hypotensive blood pressure 80/60, increased to 101/70 during the stroke code after IV fluid resuscitation, she was initially noted on stroke code evaluation by telemetry neurology to have right upper quadrant quadrantanopsia,L NLF flattening, LLE drift, and L sided sensory deficit, MRI brain with no acute CVA, MRA head/neck with no LVO, she denies fever, chills, reports she is compliant with her medications.  - in ED her work-up significant for MRI brain with no acute CVA, MRA head/neck with no significant LVO labs significant for AKI with a creatinine of 2 [facility] hospitalist consulted to admit.   Clinical Impression  Patient functioning near baseline for functional mobility and gait. Patient able to complete bed mobility without assist and requires min assist for transfer to standing with RW. Demonstrates good standing balance with RW and is able to ambulate without loss of balance. Patient eager to participate without OPPT for generalized weakness and balance deficits. Patient will benefit from continued physical therapy in hospital and recommended venue below to increase strength, balance, endurance for safe ADLs and gait.         Recommendations for follow up therapy are one component of a multi-disciplinary discharge planning process, led by the attending physician.  Recommendations may be updated based on patient status, additional functional criteria and insurance authorization.  Follow Up Recommendations Outpatient PT    Assistance Recommended at Discharge PRN  Functional  Status Assessment    Equipment Recommendations  None recommended by PT    Recommendations for Other Services       Precautions / Restrictions Precautions Precautions: Fall;None Restrictions Weight Bearing Restrictions: No      Mobility  Bed Mobility Overal bed mobility: Modified Independent                  Transfers Overall transfer level: Needs assistance Equipment used: Rolling walker (2 wheels) Transfers: Sit to/from Stand;Bed to chair/wheelchair/BSC Sit to Stand: Min assist Stand pivot transfers: Min guard;Min assist         General transfer comment: Assist primarily for sit to stand using RW.    Ambulation/Gait Ambulation/Gait assistance: Min guard Gait Distance (Feet): 25 Feet Assistive device: Rolling walker (2 wheels) Gait Pattern/deviations: Step-through pattern;Decreased stride length;Trunk flexed       General Gait Details: slow cadence with use of RW without loss of balance  Stairs            Wheelchair Mobility    Modified Rankin (Stroke Patients Only)       Balance Overall balance assessment: Needs assistance Sitting-balance support: No upper extremity supported;Feet supported Sitting balance-Leahy Scale: Good Sitting balance - Comments: seated at EOB   Standing balance support: Bilateral upper extremity supported;During functional activity;Reliant on assistive device for balance Standing balance-Leahy Scale: Fair Standing balance comment: using RW                             Pertinent Vitals/Pain Pain Assessment: No/denies pain    Home Living Family/patient expects to be discharged to:: Private residence Living Arrangements: Alone Available Help at Discharge:  Personal care attendant;Available PRN/intermittently Type of Home: House Home Access: Stairs to enter Entrance Stairs-Rails: Right (going up) Entrance Stairs-Number of Steps: 5   Home Layout: One level Home Equipment: Conservation officer, nature (2 wheels);Cane  - single point;Grab bars - tub/shower;Wheelchair - manual;BSC/3in1 Additional Comments: Pt reports having a care aide that comes in 4 days a week for 3.5 hours.    Prior Function Prior Level of Function : Needs assist       Physical Assist : ADLs (physical)   ADLs (physical): Dressing;Bathing;IADLs Mobility Comments: Pt reports modified independence for mobility in household with cane and with RW in community. ADLs Comments: Pt is assisted by care aide for dressing and bathing as well as IADL's.     Hand Dominance   Dominant Hand: Right    Extremity/Trunk Assessment   Upper Extremity Assessment Upper Extremity Assessment: Defer to OT evaluation    Lower Extremity Assessment Lower Extremity Assessment: Generalized weakness    Cervical / Trunk Assessment Cervical / Trunk Assessment: Normal  Communication   Communication: No difficulties  Cognition Arousal/Alertness: Awake/alert Behavior During Therapy: WFL for tasks assessed/performed Overall Cognitive Status: Within Functional Limits for tasks assessed                                          General Comments      Exercises     Assessment/Plan    PT Assessment Patient needs continued PT services  PT Problem List Decreased strength;Decreased activity tolerance;Decreased balance;Decreased mobility       PT Treatment Interventions DME instruction;Balance training;Gait training;Neuromuscular re-education;Stair training;Patient/family education;Functional mobility training;Therapeutic activities;Therapeutic exercise    PT Goals (Current goals can be found in the Care Plan section)  Acute Rehab PT Goals Patient Stated Goal: Return home PT Goal Formulation: With patient Time For Goal Achievement: 02/28/21 Potential to Achieve Goals: Good    Frequency Min 3X/week   Barriers to discharge        Co-evaluation PT/OT/SLP Co-Evaluation/Treatment: Yes Reason for Co-Treatment: To address  functional/ADL transfers PT goals addressed during session: Mobility/safety with mobility;Balance;Strengthening/ROM OT goals addressed during session: ADL's and self-care       AM-PAC PT "6 Clicks" Mobility  Outcome Measure Help needed turning from your back to your side while in a flat bed without using bedrails?: None Help needed moving from lying on your back to sitting on the side of a flat bed without using bedrails?: None Help needed moving to and from a bed to a chair (including a wheelchair)?: A Little Help needed standing up from a chair using your arms (e.g., wheelchair or bedside chair)?: A Little Help needed to walk in hospital room?: A Little Help needed climbing 3-5 steps with a railing? : A Lot 6 Click Score: 19    End of Session Equipment Utilized During Treatment: Gait belt Activity Tolerance: Patient tolerated treatment well Patient left: in bed;with call bell/phone within reach Nurse Communication: Mobility status PT Visit Diagnosis: Unsteadiness on feet (R26.81);Other abnormalities of gait and mobility (R26.89);Muscle weakness (generalized) (M62.81)    Time: 4098-1191 PT Time Calculation (min) (ACUTE ONLY): 32 min   Charges:   PT Evaluation $PT Eval Low Complexity: 1 Low          9:35 AM, 02/21/21 Mearl Latin PT, DPT Physical Therapist at [facility]

## 2021-02-21 NOTE — Discharge Summary (Signed)
Physician Discharge Summary  Deborah Murray NOM:[id number] DOB: 12-21-1960 DOA: 02/20/2021  PCP: Sol Passer, MD  Admit date: 02/20/2021 Discharge date: 02/21/2021  Time spent: 35 minutes  Recommendations for Outpatient Follow-up:  Repeat basic metabolic panel to follow electrolytes and renal function. Continue assisting patient with weight loss Reassess blood pressure and further adjust antihypertensive agents.    Discharge Diagnoses:  Active Problems:   Atrial fibrillation with RVR (HCC)   Essential hypertension   Atrial fibrillation, chronic (HCC)   Obesity, Class III, BMI 40-49.9 (morbid obesity) (HCC)   Hypotension   Acute kidney injury ([place])   TIA (transient ischemic attack)   Left sided numbness   Discharge Condition: Stable and improved.  No focal deficits appreciated at time of discharge.  Patient to follow-up with PCP in 10 days.  CODE STATUS: Full code.  Diet recommendation: Heart healthy and modified carbohydrates diet.  Filed Weights   02/20/21 1800  Weight: 102.1 kg    History of present illness:  As per H&P written by Dr. Waldron Labs on 02/20/2021  [name]  is a 60 y.o. female, with past medical history of COPD, A. fib on Eliquis, and Sufficiency, she presents to ED secondary to complaints of headache, dizziness, left-sided weakness, symptoms started at 11 AM today, upon arrival to ED she was noted to be hypotensive blood pressure 80/60, increased to 101/70 during the stroke code after IV fluid resuscitation, she was initially noted on stroke code evaluation by telemetry neurology to have right upper quadrant quadrantanopsia,L NLF flattening, LLE drift, and L sided sensory deficit, MRI brain with no acute CVA, MRA head/neck with no LVO, she denies fever, chills, reports she is compliant with her medications. - in ED her work-up significant for MRI brain with no acute CVA, MRA head/neck with no significant LVO labs significant for AKI with a  creatinine of 2 [facility] hospitalist consulted to admit.  Hospital Course:  1-TIA -Patient presented with transient left-sided weakness most likely trigger in the setting of hypoperfusion from low blood pressure. -MRI, MRA and CT of the head failed to demonstrate any acute abnormality -Patient will continue using Eliquis and statins for secondary prevention. -Antihypertensive agents has been adjusted and the patient instructed to maintain adequate hydration.  2-acute kidney injury -In the setting of hypotension, volume depletion and continue use of nephrotoxic agent -Significantly improved after fluid resuscitation and holding medication -Repeat basic metabolic panel at follow-up visit to reassess renal function and stability.  3-diabetes mellitus -Safe to resume home hypoglycemic agent -Advised to follow modified carbohydrate diet -Continue to follow CBGs and A1c with further adjustment on her medication regimen as an outpatient.  4-Hyperlipidemia -Continue statins  5-history of adrenal insufficiency -Continue home dose of hydrocortisone -Continue outpatient follow-up.  6-prior history of atrial fibrillation -Rate controlled on a stable -Continue Eliquis for secondary prevention.  7-hypothyroidism -Continue Synthroid.  8-morbid obesity -Body mass index is 41.15 kg/m. -Low calorie diet, portion control and increasing activity discussed with patient.  Procedures: See below for x-ray reports. 2D echo demonstrating no significant valvular disorder and preserved ejection fraction.  Left ventricular diastolic parameters were indetermine.  Consultations: Teleneurology  Discharge Exam: Vitals:   02/21/21 0846 02/21/21 1009  BP:  114/80  Pulse:  67  Resp:  18  Temp:  98.3 F (36.8 C)  SpO2: 98% 100%    General: Stable and improved.  Discharged home with instructions to follow-up with PCP in 10 days Cardiovascular: Rate controlled, no rubs, no gallops, no  JVD. Respiratory: Clear to auscultation bilaterally; no using accessory muscle. Abdomen: Obese, soft, nontender, nondistended, positive bowel sounds Extremities: No cyanosis or clubbing.  Discharge Instructions   Discharge Instructions     Ambulatory referral to Physical Therapy   Complete by: As directed    Diet - low sodium heart healthy   Complete by: As directed    Discharge instructions   Complete by: As directed    Take medications as prescribed Arrange follow up with PCP in 10 days Follow heart healthy and low calorie diet Maintain adequate hydration      Allergies as of 02/21/2021       Reactions   Aspirin Shortness Of Breath, Palpitations   Codeine Sulfate Shortness Of Breath, Palpitations   Darvocet [propoxyphene N-acetaminophen] Shortness Of Breath, Palpitations   Nsaids    Exacerbation of asthma   Tramadol Shortness Of Breath   Gabapentin Other (See Comments)   dizziness   Penicillins Other (See Comments)   Has patient had a PCN reaction causing immediate rash, facial/tongue/throat swelling, SOB or lightheadedness with hypotension: No Has patient had a PCN reaction causing severe rash involving mucus membranes or skin necrosis: No Has patient had a PCN reaction that required hospitalization No Has patient had a PCN reaction occurring within the last 10 years: Yes If all of the above answers are "NO", then may proceed with Cephalosporin use.        Medication List     STOP taking these medications    ciprofloxacin 750 MG tablet Commonly known as: CIPRO   levocetirizine 5 MG tablet Commonly known as: XYZAL       TAKE these medications    acetaminophen 500 MG tablet Commonly known as: TYLENOL Take 1,000 mg every 6 (six) hours as needed by mouth (for pain.).   albuterol 108 (90 Base) MCG/ACT inhaler Commonly known as: VENTOLIN HFA Inhale 2 puffs into the lungs every 4 (four) hours as needed for wheezing.   apixaban 5 MG Tabs tablet Commonly  known as: ELIQUIS Take 5 mg by mouth 2 (two) times daily.   atorvastatin 20 MG tablet Commonly known as: LIPITOR Take 20 mg by mouth every evening.   BARIATRIC MULTIVITAMINS/IRON PO Take 1 tablet by mouth daily.   brimonidine 0.2 % ophthalmic solution Commonly known as: ALPHAGAN Place 1 drop into both eyes 2 (two) times daily.   CALTRATE 600+D PO Take 1 tablet by mouth daily.   cetirizine 5 MG tablet Commonly known as: ZYRTEC Take 5 mg by mouth daily.   cholecalciferol 25 MCG (1000 UNIT) tablet Commonly known as: VITAMIN D3 Take 1,000 Units by mouth daily.   diclofenac Sodium 1 % Gel Commonly known as: VOLTAREN Apply 4 g topically 4 (four) times daily.   DULoxetine 30 MG capsule Commonly known as: CYMBALTA Take 30 mg by mouth daily.   ferrous sulfate 325 (65 FE) MG tablet Take 325 mg by mouth daily with breakfast.   flunisolide 25 MCG/ACT (0.025%) Soln Commonly known as: NASALIDE Place 2 sprays into the nose daily as needed for allergies.   hydrocortisone 10 MG tablet Commonly known as: CORTEF Take 5-10 mg by mouth See admin instructions. Take 1 tablet (10 mg) by mouth in the morning & take 0.5 tablet (5 mg) by mouth in the afternoon.   levothyroxine 50 MCG tablet Commonly known as: SYNTHROID Take 50 mcg by mouth daily before breakfast.   Linzess 145 MCG Caps capsule Generic drug: linaclotide TAKE ONE CAPSULE BY MOUTH DAILY BEFORE  BREAKFAST. What changed: See the new instructions.   lisinopril 10 MG tablet Commonly known as: ZESTRIL Take 0.5 tablets (5 mg total) by mouth daily. What changed: how much to take   melatonin 5 MG Tabs Take 5 mg by mouth at bedtime.   montelukast 10 MG tablet Commonly known as: SINGULAIR Take 10 mg by mouth at bedtime.   Narcan 4 MG/0.1ML Liqd nasal spray kit Generic drug: naloxone Place 1 spray into the nose once.   omeprazole 20 MG capsule Commonly known as: PRILOSEC Take 1 capsule (20 mg total) by mouth daily. 30  minutes before breakfast.   ondansetron 4 MG disintegrating tablet Commonly known as: ZOFRAN-ODT TAKE 1 TABLET BY MOUTH EVERY 8 HOURS AS NEEDED. What changed: reasons to take this   oxyCODONE 15 MG immediate release tablet Commonly known as: ROXICODONE Take 15 mg by mouth every 8 (eight) hours as needed for pain.   polyethylene glycol 17 g packet Commonly known as: MIRALAX / GLYCOLAX Take 17 g by mouth daily as needed for moderate constipation.   potassium chloride SA 20 MEQ tablet Commonly known as: KLOR-CON Take 20 mEq by mouth daily.   pregabalin 100 MG capsule Commonly known as: LYRICA Take 100 mg by mouth 2 (two) times daily.   Symbicort 80-4.5 MCG/ACT inhaler Generic drug: budesonide-formoterol Inhale 2 puffs into the lungs 2 (two) times daily.   traZODone 50 MG tablet Commonly known as: DESYREL Take 75 mg by mouth at bedtime.   Xolair 150 MG injection Generic drug: omalizumab Inject 300 mg into the skin every 28 (twenty-eight) days.       Allergies  Allergen Reactions   Aspirin Shortness Of Breath and Palpitations   Codeine Sulfate Shortness Of Breath and Palpitations   Darvocet [Propoxyphene N-Acetaminophen] Shortness Of Breath and Palpitations   Nsaids     Exacerbation of asthma   Tramadol Shortness Of Breath   Gabapentin Other (See Comments)    dizziness   Penicillins Other (See Comments)    Has patient had a PCN reaction causing immediate rash, facial/tongue/throat swelling, SOB or lightheadedness with hypotension: No Has patient had a PCN reaction causing severe rash involving mucus membranes or skin necrosis: No Has patient had a PCN reaction that required hospitalization No Has patient had a PCN reaction occurring within the last 10 years: Yes If all of the above answers are "NO", then may proceed with Cephalosporin use.     Follow-up Information     Sol Passer, MD. Schedule an appointment as soon as possible for a visit in 10 day(s).    Specialty: Internal Medicine Contact information: [address] [place]  [phone]                  The results of significant diagnostics from this hospitalization (including imaging, microbiology, ancillary and laboratory) are listed below for reference.    Significant Diagnostic Studies: MR ANGIO HEAD WO CONTRAST  Result Date: 02/20/2021 CLINICAL DATA:  Initial evaluation for neuro deficit, stroke. Dizziness with headache and left-sided sensation changes. EXAM: MRI HEAD WITHOUT CONTRAST MRA HEAD WITHOUT CONTRAST MRA NECK WITHOUT CONTRAST TECHNIQUE: Multiplanar, multiecho pulse sequences of the brain and surrounding structures were obtained without intravenous contrast. Angiographic images of the Circle of Willis were obtained using MRA technique without intravenous contrast. Angiographic images of the neck were obtained using MRA technique without intravenous contrast. Carotid stenosis measurements (when applicable) are obtained utilizing NASCET criteria, using the distal internal carotid diameter as  the denominator. COMPARISON:  Prior head CT from earlier the same day. FINDINGS: MRI HEAD FINDINGS Brain: Cerebral volume within normal limits for age. Mild patchy T2/FLAIR hyperintensity within the periventricular deep white matter both cerebral hemispheres most consistent with chronic small vessel ischemic disease, mild for age. No abnormal foci of restricted diffusion to suggest acute or subacute ischemia. Gray-white matter differentiation maintained. No encephalomalacia to suggest chronic cortical infarction. No evidence for acute or chronic intracranial hemorrhage. No mass lesion or midline shift. No hydrocephalus or extra-axial fluid collection. Pituitary gland suprasellar region within normal limits. Vascular: Major intracranial vascular flow voids are well maintained. Skull and upper cervical spine: Craniocervical junction within normal limits. Degenerative  spondylosis noted at C3-4. Bone marrow signal intensity grossly within normal limits. No scalp soft tissue abnormality. Sinuses/Orbits: Globes and orbital soft tissues demonstrate no acute finding. Scattered mucosal thickening noted within the ethmoidal air cells. Paranasal sinuses are otherwise clear. No significant mastoid effusion. Inner ear structures grossly normal. Other: None. MRA HEAD FINDINGS ANTERIOR CIRCULATION: Examination mildly degraded by motion. Visualized distal cervical segments of the internal carotid arteries are patent with antegrade flow. Petrous, cavernous, and supraclinoid segments patent without definite stenosis on this motion degraded exam. A1 segments patent bilaterally. Normal anterior communicating artery complex. Anterior cerebral arteries patent without stenosis. No M1 stenosis or occlusion. No proximal MCA branch occlusion. Distal MCA branches perfused and symmetric. POSTERIOR CIRCULATION: Both vertebral arteries patent to the vertebrobasilar junction without stenosis. Neither PICA origin well visualized. Basilar patent to its distal aspect without stenosis. Superior cerebral arteries patent at their origins. Both PCAs primarily supplied via the basilar well perfused to their distal aspects without stenosis. Anatomic variants: None significant. No visible aneurysm or other vascular abnormality. MRA NECK FINDINGS AORTIC ARCH: Examination technically limited by motion and lack of IV contrast. Aortic arch and origin of the great vessels not included or well assessed on this examination. RIGHT CAROTID SYSTEM: Visualized right CCA patent without stenosis. No significant atheromatous irregularity or narrowing about the right carotid bulb. Visualized right ICA patent without stenosis, evidence for dissection or occlusion. LEFT CAROTID SYSTEM: Visualized left CCA patent without stenosis. No significant atheromatous narrowing or irregularity about the left carotid bulb. Visualized left ICA  patent without stenosis, evidence for dissection, or occlusion. VERTEBRAL ARTERIES: Vertebral arteries appear to arise from the subclavian arteries, although neither vertebral artery origin well visualized on this exam. Right vertebral artery slightly dominant. Visualized portions of the vertebral arteries are patent with antegrade flow, with no visible stenosis, evidence for dissection or occlusion. IMPRESSION: MRI HEAD: 1. No acute intracranial infarct or other abnormality. 2. Mild chronic microvascular ischemic disease for age. MRA HEAD: Negative intracranial MRA. No large vessel occlusion, hemodynamically significant stenosis, or other acute vascular abnormality. MRA NECK: Negative MRA of the neck. No hemodynamically significant or critical flow limiting stenosis. Electronically Signed   By: Jeannine Boga M.D.   On: 02/20/2021 19:29   MR ANGIO NECK WO CONTRAST  Result Date: 02/20/2021 CLINICAL DATA:  Initial evaluation for neuro deficit, stroke. Dizziness with headache and left-sided sensation changes. EXAM: MRI HEAD WITHOUT CONTRAST MRA HEAD WITHOUT CONTRAST MRA NECK WITHOUT CONTRAST TECHNIQUE: Multiplanar, multiecho pulse sequences of the brain and surrounding structures were obtained without intravenous contrast. Angiographic images of the Circle of Willis were obtained using MRA technique without intravenous contrast. Angiographic images of the neck were obtained using MRA technique without intravenous contrast. Carotid stenosis measurements (when applicable) are obtained utilizing NASCET criteria, using the distal  internal carotid diameter as the denominator. COMPARISON:  Prior head CT from earlier the same day. FINDINGS: MRI HEAD FINDINGS Brain: Cerebral volume within normal limits for age. Mild patchy T2/FLAIR hyperintensity within the periventricular deep white matter both cerebral hemispheres most consistent with chronic small vessel ischemic disease, mild for age. No abnormal foci of  restricted diffusion to suggest acute or subacute ischemia. Gray-white matter differentiation maintained. No encephalomalacia to suggest chronic cortical infarction. No evidence for acute or chronic intracranial hemorrhage. No mass lesion or midline shift. No hydrocephalus or extra-axial fluid collection. Pituitary gland suprasellar region within normal limits. Vascular: Major intracranial vascular flow voids are well maintained. Skull and upper cervical spine: Craniocervical junction within normal limits. Degenerative spondylosis noted at C3-4. Bone marrow signal intensity grossly within normal limits. No scalp soft tissue abnormality. Sinuses/Orbits: Globes and orbital soft tissues demonstrate no acute finding. Scattered mucosal thickening noted within the ethmoidal air cells. Paranasal sinuses are otherwise clear. No significant mastoid effusion. Inner ear structures grossly normal. Other: None. MRA HEAD FINDINGS ANTERIOR CIRCULATION: Examination mildly degraded by motion. Visualized distal cervical segments of the internal carotid arteries are patent with antegrade flow. Petrous, cavernous, and supraclinoid segments patent without definite stenosis on this motion degraded exam. A1 segments patent bilaterally. Normal anterior communicating artery complex. Anterior cerebral arteries patent without stenosis. No M1 stenosis or occlusion. No proximal MCA branch occlusion. Distal MCA branches perfused and symmetric. POSTERIOR CIRCULATION: Both vertebral arteries patent to the vertebrobasilar junction without stenosis. Neither PICA origin well visualized. Basilar patent to its distal aspect without stenosis. Superior cerebral arteries patent at their origins. Both PCAs primarily supplied via the basilar well perfused to their distal aspects without stenosis. Anatomic variants: None significant. No visible aneurysm or other vascular abnormality. MRA NECK FINDINGS AORTIC ARCH: Examination technically limited by motion  and lack of IV contrast. Aortic arch and origin of the great vessels not included or well assessed on this examination. RIGHT CAROTID SYSTEM: Visualized right CCA patent without stenosis. No significant atheromatous irregularity or narrowing about the right carotid bulb. Visualized right ICA patent without stenosis, evidence for dissection or occlusion. LEFT CAROTID SYSTEM: Visualized left CCA patent without stenosis. No significant atheromatous narrowing or irregularity about the left carotid bulb. Visualized left ICA patent without stenosis, evidence for dissection, or occlusion. VERTEBRAL ARTERIES: Vertebral arteries appear to arise from the subclavian arteries, although neither vertebral artery origin well visualized on this exam. Right vertebral artery slightly dominant. Visualized portions of the vertebral arteries are patent with antegrade flow, with no visible stenosis, evidence for dissection or occlusion. IMPRESSION: MRI HEAD: 1. No acute intracranial infarct or other abnormality. 2. Mild chronic microvascular ischemic disease for age. MRA HEAD: Negative intracranial MRA. No large vessel occlusion, hemodynamically significant stenosis, or other acute vascular abnormality. MRA NECK: Negative MRA of the neck. No hemodynamically significant or critical flow limiting stenosis. Electronically Signed   By: Jeannine Boga M.D.   On: 02/20/2021 19:29   MR BRAIN WO CONTRAST  Result Date: 02/20/2021 CLINICAL DATA:  Initial evaluation for neuro deficit, stroke. Dizziness with headache and left-sided sensation changes. EXAM: MRI HEAD WITHOUT CONTRAST MRA HEAD WITHOUT CONTRAST MRA NECK WITHOUT CONTRAST TECHNIQUE: Multiplanar, multiecho pulse sequences of the brain and surrounding structures were obtained without intravenous contrast. Angiographic images of the Circle of Willis were obtained using MRA technique without intravenous contrast. Angiographic images of the neck were obtained using MRA technique  without intravenous contrast. Carotid stenosis measurements (when applicable) are obtained utilizing NASCET  criteria, using the distal internal carotid diameter as the denominator. COMPARISON:  Prior head CT from earlier the same day. FINDINGS: MRI HEAD FINDINGS Brain: Cerebral volume within normal limits for age. Mild patchy T2/FLAIR hyperintensity within the periventricular deep white matter both cerebral hemispheres most consistent with chronic small vessel ischemic disease, mild for age. No abnormal foci of restricted diffusion to suggest acute or subacute ischemia. Gray-white matter differentiation maintained. No encephalomalacia to suggest chronic cortical infarction. No evidence for acute or chronic intracranial hemorrhage. No mass lesion or midline shift. No hydrocephalus or extra-axial fluid collection. Pituitary gland suprasellar region within normal limits. Vascular: Major intracranial vascular flow voids are well maintained. Skull and upper cervical spine: Craniocervical junction within normal limits. Degenerative spondylosis noted at C3-4. Bone marrow signal intensity grossly within normal limits. No scalp soft tissue abnormality. Sinuses/Orbits: Globes and orbital soft tissues demonstrate no acute finding. Scattered mucosal thickening noted within the ethmoidal air cells. Paranasal sinuses are otherwise clear. No significant mastoid effusion. Inner ear structures grossly normal. Other: None. MRA HEAD FINDINGS ANTERIOR CIRCULATION: Examination mildly degraded by motion. Visualized distal cervical segments of the internal carotid arteries are patent with antegrade flow. Petrous, cavernous, and supraclinoid segments patent without definite stenosis on this motion degraded exam. A1 segments patent bilaterally. Normal anterior communicating artery complex. Anterior cerebral arteries patent without stenosis. No M1 stenosis or occlusion. No proximal MCA branch occlusion. Distal MCA branches perfused and  symmetric. POSTERIOR CIRCULATION: Both vertebral arteries patent to the vertebrobasilar junction without stenosis. Neither PICA origin well visualized. Basilar patent to its distal aspect without stenosis. Superior cerebral arteries patent at their origins. Both PCAs primarily supplied via the basilar well perfused to their distal aspects without stenosis. Anatomic variants: None significant. No visible aneurysm or other vascular abnormality. MRA NECK FINDINGS AORTIC ARCH: Examination technically limited by motion and lack of IV contrast. Aortic arch and origin of the great vessels not included or well assessed on this examination. RIGHT CAROTID SYSTEM: Visualized right CCA patent without stenosis. No significant atheromatous irregularity or narrowing about the right carotid bulb. Visualized right ICA patent without stenosis, evidence for dissection or occlusion. LEFT CAROTID SYSTEM: Visualized left CCA patent without stenosis. No significant atheromatous narrowing or irregularity about the left carotid bulb. Visualized left ICA patent without stenosis, evidence for dissection, or occlusion. VERTEBRAL ARTERIES: Vertebral arteries appear to arise from the subclavian arteries, although neither vertebral artery origin well visualized on this exam. Right vertebral artery slightly dominant. Visualized portions of the vertebral arteries are patent with antegrade flow, with no visible stenosis, evidence for dissection or occlusion. IMPRESSION: MRI HEAD: 1. No acute intracranial infarct or other abnormality. 2. Mild chronic microvascular ischemic disease for age. MRA HEAD: Negative intracranial MRA. No large vessel occlusion, hemodynamically significant stenosis, or other acute vascular abnormality. MRA NECK: Negative MRA of the neck. No hemodynamically significant or critical flow limiting stenosis. Electronically Signed   By: Jeannine Boga M.D.   On: 02/20/2021 19:29   ECHOCARDIOGRAM COMPLETE  Result Date:  02/21/2021    ECHOCARDIOGRAM REPORT   Patient Name:   KARYSSA AMARAL Wisehart Date of Exam: 02/21/2021 Medical Rec #:  [id number]         Height:       62.0 in Accession #:    [id number]        Weight:       225.0 lb Date of Birth:  09/09/1960          BSA:  2.010 m Patient Age:    60 years          BP:           160/94 mmHg Patient Gender: F                 HR:           58 bpm. Exam Location:  [facility] Procedure: 2D Echo, Cardiac Doppler and Color Doppler Indications:    TIA  History:        Patient has no prior history of Echocardiogram examinations.                 COPD, Arrythmias:Atrial Fibrillation; Risk Factors:Hypertension,                 Dyslipidemia, Sleep Apnea and Obesity.  Sonographer:    Dustin Flock RDCS Referring Phys: [place]  1. Left ventricular ejection fraction, by estimation, is >75%. The left ventricle has normal function. The left ventricle has no regional wall motion abnormalities. There is mild left ventricular hypertrophy. Left ventricular diastolic parameters are indeterminate.  2. Right ventricular systolic function is normal. The right ventricular size is normal. There is normal pulmonary artery systolic pressure.  3. The mitral valve is normal in structure. No evidence of mitral valve regurgitation. No evidence of mitral stenosis.  4. The aortic valve has an indeterminant number of cusps. Aortic valve regurgitation is not visualized. No aortic stenosis is present. FINDINGS  Left Ventricle: Left ventricular ejection fraction, by estimation, is >75%. The left ventricle has normal function. The left ventricle has no regional wall motion abnormalities. The left ventricular internal cavity size was normal in size. There is mild  left ventricular hypertrophy. Left ventricular diastolic parameters are indeterminate. Right Ventricle: The right ventricular size is normal. No increase in right ventricular wall thickness. Right ventricular systolic function is  normal. There is normal pulmonary artery systolic pressure. The tricuspid regurgitant velocity is 2.65 m/s, and  with an assumed right atrial pressure of 3 mmHg, the estimated right ventricular systolic pressure is 50.5 mmHg. Left Atrium: Left atrial size was normal in size. Right Atrium: Right atrial size was normal in size. Pericardium: There is no evidence of pericardial effusion. Mitral Valve: The mitral valve is normal in structure. No evidence of mitral valve regurgitation. No evidence of mitral valve stenosis. Tricuspid Valve: The tricuspid valve is not well visualized. Tricuspid valve regurgitation is mild . No evidence of tricuspid stenosis. Aortic Valve: The aortic valve has an indeterminant number of cusps. Aortic valve regurgitation is not visualized. No aortic stenosis is present. Aortic valve mean gradient measures 4.8 mmHg. Aortic valve peak gradient measures 7.0 mmHg. Aortic valve area, by VTI measures 2.65 cm. Pulmonic Valve: The pulmonic valve was not well visualized. Pulmonic valve regurgitation is not visualized. No evidence of pulmonic stenosis. Aorta: The aortic root is normal in size and structure. IAS/Shunts: No atrial level shunt detected by color flow Doppler.  LEFT VENTRICLE PLAX 2D LVIDd:         4.40 cm   Diastology LVIDs:         2.50 cm   LV e' medial:    7.40 cm/s LV PW:         1.20 cm   LV E/e' medial:  9.6 LV IVS:        1.00 cm   LV e' lateral:   5.77 cm/s LVOT diam:     2.10 cm  LV E/e' lateral: 12.3 LV SV:         79 LV SV Index:   39 LVOT Area:     3.46 cm  RIGHT VENTRICLE RV Basal diam:  2.10 cm RV S prime:     13.70 cm/s TAPSE (M-mode): 1.8 cm LEFT ATRIUM             Index        RIGHT ATRIUM          Index LA diam:        2.90 cm 1.44 cm/m   RA Area:     9.54 cm LA Vol (A2C):   25.7 ml 12.79 ml/m  RA Volume:   19.50 ml 9.70 ml/m LA Vol (A4C):   40.5 ml 20.15 ml/m LA Biplane Vol: 32.9 ml 16.37 ml/m  AORTIC VALVE AV Area (Vmax):    2.92 cm AV Area (Vmean):   2.20 cm  AV Area (VTI):     2.65 cm AV Vmax:           131.87 cm/s AV Vmean:          106.502 cm/s AV VTI:            0.296 m AV Peak Grad:      7.0 mmHg AV Mean Grad:      4.8 mmHg LVOT Vmax:         111.00 cm/s LVOT Vmean:        67.500 cm/s LVOT VTI:          0.227 m LVOT/AV VTI ratio: 0.77  AORTA Ao Root diam: 3.10 cm MITRAL VALVE               TRICUSPID VALVE MV Area (PHT): 4.21 cm    TR Peak grad:   28.1 mmHg MV Decel Time: 180 msec    TR Vmax:        265.00 cm/s MV E velocity: 70.70 cm/s MV A velocity: 66.80 cm/s  SHUNTS MV E/A ratio:  1.06        Systemic VTI:  0.23 m                            Systemic Diam: 2.10 cm Carlyle Dolly MD Electronically signed by Carlyle Dolly MD Signature Date/Time: 02/21/2021/11:11:30 AM    Final    CT HEAD CODE STROKE WO CONTRAST`  Result Date: 02/20/2021 CLINICAL DATA:  Code stroke. Headache, new or worsening (Age >= 50y) EXAM: CT HEAD WITHOUT CONTRAST TECHNIQUE: Contiguous axial images were obtained from the base of the skull through the vertex without intravenous contrast. COMPARISON:  August 07, 2012. FINDINGS: Brain: No evidence of acute infarction, hemorrhage, hydrocephalus, extra-axial collection or mass lesion/mass effect. Chronic areas of mineralization along the falx and dura, not substantially changed. Vascular: No hyperdense vessel identified. Skull: No acute fracture. Sinuses/Orbits: Clear sinuses.  Unremarkable orbits. Other: No mastoid effusions. ASPECTS [facility] Stroke Program Early CT Score) Total score (0-10 with 10 being normal): 10. IMPRESSION: No evidence of acute intracranial abnormality.   ASPECTS is 10. Findings discussed with Dr. Sabra Heck via telephone at 5:42 PM. Electronically Signed   By: Margaretha Sheffield M.D.   On: 02/20/2021 17:44    Microbiology: Recent Results (from the past 240 hour(s))  Resp Panel by RT-PCR (Flu A&B, Covid) Nasopharyngeal Swab     Status: None   Collection Time: 02/20/21  5:39 PM   Specimen: Nasopharyngeal Swab;  Nasopharyngeal(NP)  swabs in vial transport medium  Result Value Ref Range Status   SARS Coronavirus 2 by RT PCR NEGATIVE NEGATIVE Final    Comment: (NOTE) SARS-CoV-2 target nucleic acids are NOT DETECTED.  The SARS-CoV-2 RNA is generally detectable in upper respiratory specimens during the acute phase of infection. The lowest concentration of SARS-CoV-2 viral copies this assay can detect is 138 copies/mL. A negative result does not preclude SARS-Cov-2 infection and should not be used as the sole basis for treatment or other patient management decisions. A negative result may occur with  improper specimen collection/handling, submission of specimen other than nasopharyngeal swab, presence of viral mutation(s) within the areas targeted by this assay, and inadequate number of viral copies(<138 copies/mL). A negative result must be combined with clinical observations, patient history, and epidemiological information. The expected result is Negative.  Fact Sheet for Patients:  EntrepreneurPulse.com.au  Fact Sheet for Healthcare Providers:  IncredibleEmployment.be  This test is no t yet approved or cleared by the [place] FDA and  has been authorized for detection and/or diagnosis of SARS-CoV-2 by FDA under an Emergency Use Authorization (EUA). This EUA will remain  in effect (meaning this test can be used) for the duration of the COVID-19 declaration under Section 564(b)(1) of the Act, 21 U.S.C.section 360bbb-3(b)(1), unless the authorization is terminated  or revoked sooner.       Influenza A by PCR NEGATIVE NEGATIVE Final   Influenza B by PCR NEGATIVE NEGATIVE Final    Comment: (NOTE) The Xpert Xpress SARS-CoV-2/FLU/RSV plus assay is intended as an aid in the diagnosis of influenza from Nasopharyngeal swab specimens and should not be used as a sole basis for treatment. Nasal washings and aspirates are unacceptable for Xpert Xpress  SARS-CoV-2/FLU/RSV testing.  Fact Sheet for Patients: EntrepreneurPulse.com.au  Fact Sheet for Healthcare Providers: IncredibleEmployment.be  This test is not yet approved or cleared by the [place] FDA and has been authorized for detection and/or diagnosis of SARS-CoV-2 by FDA under an Emergency Use Authorization (EUA). This EUA will remain in effect (meaning this test can be used) for the duration of the COVID-19 declaration under Section 564(b)(1) of the Act, 21 U.S.C. section 360bbb-3(b)(1), unless the authorization is terminated or revoked.  Performed at [facility], [address] [place], [place] [address]      Labs: Basic Metabolic Panel: Recent Labs  Lab 02/20/21 1737 02/20/21 1743 02/21/21 0417  NA 139 140 142  K 3.9 3.9 4.5  CL 109 108 114*  CO2 23  --  20*  GLUCOSE 85 79 108*  BUN 31* 28* 25*  CREATININE 1.87* 2.00* 1.10*  CALCIUM 8.0*  --  8.1*   Liver Function Tests: Recent Labs  Lab 02/20/21 1737 02/21/21 0417  AST 31 27  ALT 36 34  ALKPHOS 69 68  BILITOT 0.5 0.5  PROT 5.6* 5.5*  ALBUMIN 3.3* 3.1*   CBC: Recent Labs  Lab 02/20/21 1737 02/20/21 1743 02/21/21 0417  WBC 6.6  --  4.8  NEUTROABS 3.9  --   --   HGB 11.2* 11.6* 12.0  HCT 36.1 34.0* 38.2  MCV 95.3  --  92.3  PLT 221  --  241   CBG: Recent Labs  Lab 02/20/21 1729 02/20/21 2231 02/21/21 0734 02/21/21 1118  GLUCAP 87 85 111* 88    Signed:  Barton Dubois MD.  [facility] 02/21/2021, 12:37 PM

## 2021-02-21 NOTE — Evaluation (Signed)
Occupational Therapy Evaluation Patient Details Name: Deborah Murray MRN: [id number] DOB: 1960/12/04 Today's Date: 02/21/2021   History of Present Illness Deborah Murray  is a 60 y.o. female, with past medical history of COPD, A. fib on Eliquis, and  Sufficiency, she presents to ED secondary to complaints of headache, dizziness, left-sided weakness, symptoms started at 11 AM today, upon arrival to ED she was noted to be hypotensive blood pressure 80/60, increased to 101/70 during the stroke code after IV fluid resuscitation, she was initially noted on stroke code evaluation by telemetry neurology to have right upper quadrant quadrantanopsia,L NLF flattening, LLE drift, and L sided sensory deficit, MRI brain with no acute CVA, MRA head/neck with no LVO, she denies fever, chills, reports she is compliant with her medications.  - in ED her work-up significant for MRI brain with no acute CVA, MRA head/neck with no significant LVO labs significant for AKI with a creatinine of 2 [facility] consulted to admit.   Clinical Impression   Pt agreeable to OT/PT co-evaluation. Pt able to complete bed mobility with modified independence. Pt demonstrated need for Min A for sit to stand from EOB but did not require assist for sit to stand from toilet. Pt generally functionally ambulated with Mod I level of assist and was able to complete grooming and bathing stands standing at the sink with RW.  Pt did not demonstrate any significant visual deficits and appears to be near baseline levels. Pt is not recommended for further acute OT services and will be discharged to care of nursing staff for remaining length of stay.      Recommendations for follow up therapy are one component of a multi-disciplinary discharge planning process, led by the attending physician.  Recommendations may be updated based on patient status, additional functional criteria and insurance authorization.   Follow Up Recommendations  No OT  follow up    Assistance Recommended at Discharge Other (comment) (assit with transfers)  Functional Status Assessment  Patient has not had a recent decline in their functional status  Equipment Recommendations  None recommended by OT    Recommendations for Other Services       Precautions / Restrictions Precautions Precautions: Fall;None Restrictions Weight Bearing Restrictions: No      Mobility Bed Mobility Overal bed mobility: Modified Independent                  Transfers Overall transfer level: Needs assistance Equipment used: Rolling walker (2 wheels) Transfers: Sit to/from Stand;Bed to chair/wheelchair/BSC Sit to Stand: Min assist Stand pivot transfers: Min guard;Min assist         General transfer comment: Assist primarily for sit to stand using RW.      Balance Overall balance assessment: Needs assistance Sitting-balance support: No upper extremity supported;Feet supported Sitting balance-Leahy Scale: Good Sitting balance - Comments: seated at EOB   Standing balance support: Bilateral upper extremity supported;During functional activity;Reliant on assistive device for balance Standing balance-Leahy Scale: Fair Standing balance comment: using RW                           ADL either performed or assessed with clinical judgement   ADL Overall ADL's : Needs assistance/impaired     Grooming: Modified independent;Wash/dry hands;Standing Grooming Details (indicate cue type and reason): Pt able to complete standing at sink with RW.     Lower Body Bathing: Sit to/from stand;Min guard;Minimal assistance Lower Body Bathing Details (indicate cue  type and reason): Completed with pt standing at sink with RW.     Lower Body Dressing: Moderate assistance;Sitting/lateral leans Lower Body Dressing Details (indicate cue type and reason): Pt able to doff socks with assist to don; pt reports using sock aide at baseline. Toilet Transfer: Rolling  walker (2 wheels);Ambulation;Minimal assistance Toilet Transfer Details (indicate cue type and reason): EOB to toilet transfer with RW Toileting- Clothing Manipulation and Hygiene: Modified independent;Sitting/lateral lean       Functional mobility during ADLs: Modified independent;Rolling walker (2 wheels)       Vision Baseline Vision/History: 3 Glaucoma Ability to See in Adequate Light: 1 Impaired Patient Visual Report: No change from baseline (Pt was experiencing blurred vision but this has resolved.) Vision Assessment?: Yes Tracking/Visual Pursuits: Able to track stimulus in all quads without difficulty Convergence: Impaired (comment) (Minimal convergence noted.) Visual Fields: No apparent deficits Additional Comments: No apparent deficits despite report of R upper quad deficits in chart review.                Pertinent Vitals/Pain Pain Assessment: No/denies pain     Hand Dominance Right   Extremity/Trunk Assessment Upper Extremity Assessment Upper Extremity Assessment: Generalized weakness   Lower Extremity Assessment Lower Extremity Assessment: Defer to PT evaluation   Cervical / Trunk Assessment Cervical / Trunk Assessment: Normal   Communication Communication Communication: No difficulties   Cognition Arousal/Alertness: Awake/alert Behavior During Therapy: WFL for tasks assessed/performed Overall Cognitive Status: Within Functional Limits for tasks assessed                                                        Home Living Family/patient expects to be discharged to:: Private residence Living Arrangements: Alone Available Help at Discharge: Personal care attendant;Available PRN/intermittently Type of Home: House Home Access: Stairs to enter [employer] of Steps: 5 Entrance Stairs-Rails: Right (going up) Home Layout: One level     Bathroom Shower/Tub: Teacher, early years/pre: Handicapped height (toilet  riser)     Home Equipment: Conservation officer, nature (2 wheels);Cane - single point;Grab bars - tub/shower;Wheelchair - manual;BSC/3in1   Additional Comments: Pt reports having a care aide that comes in 4 days a week for 3.5 hours.      Prior Functioning/Environment Prior Level of Function : Needs assist       Physical Assist : ADLs (physical)   ADLs (physical): Dressing;Bathing;IADLs Mobility Comments: Pt reports modified independence for mobility in household with cane and with RW in community. ADLs Comments: Pt is assisted by care aide for dressing and bathing as well as IADL's.                      OT Goals(Current goals can be found in the care plan section) Acute Rehab OT Goals Patient Stated Goal: return home                   Co-evaluation PT/OT/SLP Co-Evaluation/Treatment: Yes Reason for Co-Treatment: To address functional/ADL transfers   OT goals addressed during session: ADL's and self-care      AM-PAC OT "6 Clicks" Daily Activity     Outcome Measure Help from another person eating meals?: None Help from another person taking care of personal grooming?: None Help from another person toileting, which includes using toliet, bedpan, or urinal?:  A Little Help from another person bathing (including washing, rinsing, drying)?: A Little Help from another person to put on and taking off regular upper body clothing?: None Help from another person to put on and taking off regular lower body clothing?: A Little 6 Click Score: 21   End of Session Equipment Utilized During Treatment: Rolling walker (2 wheels)  Activity Tolerance: Patient tolerated treatment well Patient left: in bed;with call bell/phone within reach  OT Visit Diagnosis: Unsteadiness on feet (R26.81);Other abnormalities of gait and mobility (R26.89)                Time: 7741-2878 OT Time Calculation (min): 32 min Charges:  OT General Charges $OT Visit: 1 Visit OT Evaluation $OT Eval Low Complexity:  1 Low  [employer] OT, MOT  Larey Seat 02/21/2021, 9:24 AM

## 2021-02-21 NOTE — Plan of Care (Signed)
  Problem: Education: Goal: Knowledge of General Education information will improve Description: Including pain rating scale, medication(s)/side effects and non-pharmacologic comfort measures Outcome: Progressing   Problem: Health Behavior/Discharge Planning: Goal: Ability to manage health-related needs will improve Outcome: Progressing   Problem: Clinical Measurements: Goal: Ability to maintain clinical measurements within normal limits will improve Outcome: Progressing Goal: Will remain free from infection Outcome: Progressing Goal: Diagnostic test results will improve Outcome: Progressing Goal: Respiratory complications will improve Outcome: Progressing Goal: Cardiovascular complication will be avoided Outcome: Progressing   Problem: Activity: Goal: Risk for activity intolerance will decrease Outcome: Progressing   Problem: Nutrition: Goal: Adequate nutrition will be maintained Outcome: Progressing   Problem: Coping: Goal: Level of anxiety will decrease Outcome: Progressing   Problem: Elimination: Goal: Will not experience complications related to bowel motility Outcome: Progressing Goal: Will not experience complications related to urinary retention Outcome: Progressing   Problem: Pain Managment: Goal: General experience of comfort will improve Outcome: Progressing   Problem: Safety: Goal: Ability to remain free from injury will improve Outcome: Progressing   Problem: Skin Integrity: Goal: Risk for impaired skin integrity will decrease Outcome: Progressing   Problem: Education: Goal: Knowledge of disease or condition will improve Outcome: Progressing Goal: Knowledge of secondary prevention will improve (SELECT ALL) Outcome: Progressing   Problem: Education: Goal: Knowledge of disease or condition will improve Outcome: Progressing Goal: Knowledge of secondary prevention will improve (SELECT ALL) Outcome: Progressing   Problem: Coping: Goal: Will  verbalize positive feelings about self Outcome: Progressing Goal: Will identify appropriate support needs Outcome: Progressing   Problem: Self-Care: Goal: Ability to participate in self-care as condition permits will improve Outcome: Progressing Goal: Verbalization of feelings and concerns over difficulty with self-care will improve Outcome: Progressing   Problem: Ischemic Stroke/TIA Tissue Perfusion: Goal: Complications of ischemic stroke/TIA will be minimized Outcome: Progressing

## 2021-02-26 ENCOUNTER — Other Ambulatory Visit: Payer: Self-pay

## 2021-02-26 ENCOUNTER — Encounter: Payer: Self-pay | Admitting: Gastroenterology

## 2021-02-26 ENCOUNTER — Ambulatory Visit (INDEPENDENT_AMBULATORY_CARE_PROVIDER_SITE_OTHER): Payer: 59 | Admitting: Gastroenterology

## 2021-02-26 VITALS — BP 131/89 | HR 62 | Temp 96.6°F | Ht 62.0 in | Wt 238.2 lb

## 2021-02-26 DIAGNOSIS — D509 Iron deficiency anemia, unspecified: Secondary | ICD-10-CM

## 2021-02-26 DIAGNOSIS — K5900 Constipation, unspecified: Secondary | ICD-10-CM | POA: Diagnosis not present

## 2021-02-26 NOTE — Progress Notes (Signed)
Referring Provider: Sol Passer, MD Primary Care Physician:  Sol Passer, MD Primary GI: Dr. Gala Romney   Chief Complaint  Patient presents with   Gastroesophageal Reflux    ok   Constipation    Ok, takes Linzess prn    HPI:   Deborah Murray is a 60 y.o. 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 Noted recurrence of IDA recently and underwent early interval colonoscopy/EGD unrevealing. History of gastric bypass with malabsroption likely contributing. Hgb improved to 12 recently.   Taking Linzess 145 mcg daily as needed. BM daily. No GI bleeding. No abdominal pain.   Omeprazole 20 mg daily. GERD controlled. No dysphagia.  Recently discharged with presumed TIA.   Past Medical History:  Diagnosis Date   Adrenal abnormality ([place])    Adrenal insufficiency ([place]) 2013   Tx at [facility]   Anemia    Arthritis    Asthma    Atrial fibrillation (HCC)    Chronic back pain    COPD (chronic obstructive pulmonary disease) (HCC)    GERD (gastroesophageal reflux disease)    Glaucoma    Headache    Hyperlipidemia    Hypertension    Hypothyroidism    Lumbar radiculopathy    Sepsis ([place]) 11/04/2015   Sleep apnea    Syncope and collapse 05/11/11  adrenal insuffiency  treated at [facility]   Thyroid disease    Vocal cord dysfunction     Past Surgical History:  Procedure Laterality Date   ABDOMINAL HYSTERECTOMY     BACK SURGERY     disc    CARDIAC CATHETERIZATION  2012 at [place] WITH PERCUTANEOUS PINNING Left 03/26/2016   Procedure: [place];  Surgeon: Leanora Cover, MD;  Location: [facility];  Service: Orthopedics;  Laterality: Left;   COLONOSCOPY  2010   [place]: anal papilla, otherwise normal    COLONOSCOPY  WITH PROPOFOL N/A 03/03/2017   sigmoid divertivcula, 4 mm polyp in rectum (tubular adenoma), non-bleeding internal hemorrhoids, surveillance in 7 years   COLONOSCOPY WITH PROPOFOL N/A 12/26/2020   sigmoid and descending colon diverticulosis   ESOPHAGOGASTRODUODENOSCOPY N/A 09/17/2015   Surgicall altered stomach. NO specimens collected. normal esophagus   ESOPHAGOGASTRODUODENOSCOPY (EGD) WITH PROPOFOL N/A 03/03/2017   normal esophagus   ESOPHAGOGASTRODUODENOSCOPY (EGD) WITH PROPOFOL N/A 12/26/2020   normal esophagus s/p dilation.   eye surg for glaucoma     gastic by-pass  2008   Gastric by-pass in 2008 at [place] 01/26/2019   Procedure: REPAIR OF SUPRAUMBILICAL INCISIONAL HERNIA WITH MESH;  Surgeon: Erroll Luna, MD;  Location: [facility];  Service: General;  Laterality: N/A;   right knee surg     arthroscopy   SHOULDER SURGERY Bilateral     Current Outpatient Medications  Medication Sig Dispense Refill   acetaminophen (TYLENOL) 500 MG tablet Take 1,000 mg every 6 (six) hours as needed by mouth (for pain.).     albuterol (PROVENTIL HFA;VENTOLIN HFA) 108 (90 BASE) MCG/ACT inhaler Inhale 2 puffs into the lungs every 4 (four) hours as needed for wheezing.      apixaban (ELIQUIS) 5 MG TABS tablet Take 5 mg by mouth 2 (two) times daily.     atorvastatin (  LIPITOR) 20 MG tablet Take 20 mg by mouth every evening.      brimonidine (ALPHAGAN) 0.2 % ophthalmic solution Place 1 drop into both eyes 2 (two) times daily.     Calcium Carbonate-Vitamin D (CALTRATE 600+D PO) Take 1 tablet by mouth daily.     cetirizine (ZYRTEC) 5 MG tablet Take 5 mg by mouth daily.     cholecalciferol (VITAMIN D3) 25 MCG (1000 UNIT) tablet Take 1,000 Units by mouth daily.     diclofenac Sodium (VOLTAREN) 1 % GEL Apply 4 g topically 4 (four) times daily.     DULoxetine (CYMBALTA) 30 MG capsule Take 30 mg by mouth daily.     ferrous sulfate 325 (65 FE) MG tablet Take 325 mg by mouth daily  with breakfast.     flunisolide (NASALIDE) 25 MCG/ACT (0.025%) SOLN Place 2 sprays into the nose daily as needed for allergies.     hydrocortisone (CORTEF) 10 MG tablet Take 5-10 mg by mouth See admin instructions. Take 1 tablet (10 mg) by mouth in the morning & take 0.5 tablet (5 mg) by mouth in the afternoon.     levothyroxine (SYNTHROID) 50 MCG tablet Take 50 mcg by mouth daily before breakfast.     LINZESS 145 MCG CAPS capsule TAKE ONE CAPSULE BY MOUTH DAILY BEFORE BREAKFAST. (Patient taking differently: Take 145 mcg by mouth as needed.) 90 capsule 3   Melatonin 5 MG TABS Take 5 mg by mouth at bedtime.     montelukast (SINGULAIR) 10 MG tablet Take 10 mg by mouth at bedtime.     Multiple Vitamins-Minerals (BARIATRIC MULTIVITAMINS/IRON PO) Take 1 tablet by mouth daily.     NARCAN 4 MG/0.1ML LIQD nasal spray kit Place 1 spray into the nose once.     omalizumab (XOLAIR) 150 MG injection Inject 300 mg into the skin every 28 (twenty-eight) days.     omeprazole (PRILOSEC) 20 MG capsule Take 1 capsule (20 mg total) by mouth daily. 30 minutes before breakfast. 90 capsule 3   ondansetron (ZOFRAN-ODT) 4 MG disintegrating tablet TAKE 1 TABLET BY MOUTH EVERY 8 HOURS AS NEEDED. (Patient taking differently: Take 4 mg by mouth every 8 (eight) hours as needed for vomiting.) 60 tablet 3   oxyCODONE (ROXICODONE) 15 MG immediate release tablet Take 15 mg by mouth every 8 (eight) hours as needed for pain.     polyethylene glycol (MIRALAX / GLYCOLAX) 17 g packet Take 17 g by mouth daily as needed for moderate constipation. 14 each 0   potassium chloride SA (KLOR-CON) 20 MEQ tablet Take 20 mEq by mouth daily.     pregabalin (LYRICA) 100 MG capsule Take 100 mg by mouth 2 (two) times daily.     SYMBICORT 80-4.5 MCG/ACT inhaler Inhale 2 puffs into the lungs 2 (two) times daily. 1 each 12   traZODone (DESYREL) 50 MG tablet Take 75 mg by mouth at bedtime.     lisinopril (ZESTRIL) 10 MG tablet Take 0.5 tablets (5 mg total)  by mouth daily. (Patient not taking: Reported on 02/26/2021)     No current facility-administered medications for this visit.    Allergies as of 02/26/2021 - Review Complete 02/26/2021  Allergen Reaction Noted   Aspirin Shortness Of Breath and Palpitations 05/11/2011   Codeine sulfate Shortness Of Breath and Palpitations 05/11/2011   Darvocet [propoxyphene n-acetaminophen] Shortness Of Breath and Palpitations 05/11/2011   Nsaids  02/15/2013   Tramadol Shortness Of Breath 10/30/2014   Gabapentin Other (See Comments) 02/02/2017  Penicillins Other (See Comments) 05/11/2011    Family History  Problem Relation Age of Onset   Heart attack Mother    Hypertension Mother    Heart attack Father    Asthma Father    Hyperlipidemia Father    Hypertension Father    Heart attack Sister    Arrhythmia Sister    Asthma Sister    Hyperlipidemia Sister    Hypertension Sister    Asthma Brother    Hypertension Brother    Colon cancer Neg Hx     Social History   Socioeconomic History   Marital status: Single    Spouse name: Not on file   Number of children: Not on file   Years of education: Not on file   Highest education level: Not on file  Occupational History   Occupation: disability   Tobacco Use   Smoking status: Never   Smokeless tobacco: Never  Vaping Use   Vaping Use: Never used  Substance and Sexual Activity   Alcohol use: No   Drug use: No   Sexual activity: Never  Other Topics Concern   Not on file  Social History Narrative   Not on file   Social Determinants of Health   Financial Resource Strain: Not on file  Food Insecurity: Not on file  Transportation Needs: Not on file  Physical Activity: Not on file  Stress: Not on file  Social Connections: Not on file    Review of Systems: Gen: Denies fever, chills, anorexia. Denies fatigue, weakness, weight loss.  CV: Denies chest pain, palpitations, syncope, peripheral edema, and claudication. Resp: Denies dyspnea  at rest, cough, wheezing, coughing up blood, and pleurisy. GI: see HPI Derm: Denies rash, itching, dry skin Psych: Denies depression, anxiety, memory loss, confusion. No homicidal or suicidal ideation.  Heme: Denies bruising, bleeding, and enlarged lymph nodes.  Physical Exam: BP 131/89   Pulse 62   Temp (!) 96.6 F (35.9 C) (Temporal)   Ht _0  (1.575 m)   Wt 238 lb 3.2 oz (108 kg)   BMI 43.57 kg/m  General:   Alert and oriented. No distress noted. Pleasant and cooperative.  Head:  Normocephalic and atraumatic. Eyes:  Conjuctiva clear without scleral icterus. Mouth:  mask in place Abdomen:  +BS, soft, non-tender and non-distended. No rebound or guarding. No HSM or masses noted. Msk:  Symmetrical without gross deformities.Uses walker for ambulation Extremities:  Without edema. Neurologic:  Alert and  oriented x4 Psych:  Alert and cooperative. Normal mood and affect.  ASSESSMENT: GAYNELL EGGLETON is a 60 y.o. 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 Noted recurrence of IDA recently and underwent early interval colonoscopy/EGD unrevealing. History of gastric bypass with malabsroption likely contributing. Hgb improved to 12 recently.   Constipation well managed with prn Linzess 145 mcg. GERD remains controlled on once daily PPI.   IDA: improved. We will recheck CBC and iron studies in Jan 2023. Likely malabsorptive component contributing. Capsule would not show entire small bowel due to Roux-en-Y anatomy. Will follow closely.     PLAN:  Continue novel dosing of Linzess prn Continue PPI daily CBC, iron studies in Jan 2023 Return in 6 months  Annitta Needs, PhD, [facility]

## 2021-02-26 NOTE — Patient Instructions (Signed)
I am glad you are doing well!  We will repeat blood work in January.  We will see you in 6 months!  Please call me with any concerns!  Have a wonderful Thanksgiving and Christmas!   I enjoyed seeing you again today! As you know, I value our relationship and want to provide genuine, compassionate, and quality care. I welcome your feedback. If you receive a survey regarding your visit,  I greatly appreciate you taking time to fill this out. See you next time!  Annitta Needs, PhD, ANP-BC [facility]

## 2021-08-13 ENCOUNTER — Encounter: Payer: Self-pay | Admitting: Internal Medicine

## 2021-08-26 ENCOUNTER — Other Ambulatory Visit: Payer: Self-pay | Admitting: Gastroenterology
